# Patient Record
Sex: Female | Born: 1935 | Race: White | Hispanic: No | Smoking: Never smoker
Health system: Southern US, Community
[De-identification: ages and names within clinical notes are randomized; demographics above are authoritative.]

## PROBLEM LIST (undated history)

## (undated) DIAGNOSIS — R21 Rash and other nonspecific skin eruption: Secondary | ICD-10-CM

## (undated) DIAGNOSIS — E785 Hyperlipidemia, unspecified: Secondary | ICD-10-CM

## (undated) DIAGNOSIS — I2510 Atherosclerotic heart disease of native coronary artery without angina pectoris: Secondary | ICD-10-CM

## (undated) DIAGNOSIS — C50919 Malignant neoplasm of unspecified site of unspecified female breast: Secondary | ICD-10-CM

## (undated) DIAGNOSIS — E119 Type 2 diabetes mellitus without complications: Secondary | ICD-10-CM

## (undated) DIAGNOSIS — I10 Essential (primary) hypertension: Secondary | ICD-10-CM

## (undated) DIAGNOSIS — R51 Headache: Secondary | ICD-10-CM

## (undated) DIAGNOSIS — D649 Anemia, unspecified: Secondary | ICD-10-CM

## (undated) DIAGNOSIS — C801 Malignant (primary) neoplasm, unspecified: Secondary | ICD-10-CM

## (undated) DIAGNOSIS — R000 Tachycardia, unspecified: Secondary | ICD-10-CM

## (undated) DIAGNOSIS — N200 Calculus of kidney: Secondary | ICD-10-CM

## (undated) DIAGNOSIS — M1990 Unspecified osteoarthritis, unspecified site: Secondary | ICD-10-CM

## (undated) DIAGNOSIS — I499 Cardiac arrhythmia, unspecified: Secondary | ICD-10-CM

## (undated) DIAGNOSIS — R112 Nausea with vomiting, unspecified: Secondary | ICD-10-CM

## (undated) DIAGNOSIS — Z9889 Other specified postprocedural states: Secondary | ICD-10-CM

## (undated) DIAGNOSIS — R519 Headache, unspecified: Secondary | ICD-10-CM

## (undated) HISTORY — PX: EYE SURGERY: SHX253

## (undated) HISTORY — DX: Type 2 diabetes mellitus without complications: E11.9

## (undated) HISTORY — DX: Calculus of kidney: N20.0

## (undated) HISTORY — DX: Anemia, unspecified: D64.9

## (undated) HISTORY — DX: Hyperlipidemia, unspecified: E78.5

## (undated) HISTORY — DX: Atherosclerotic heart disease of native coronary artery without angina pectoris: I25.10

## (undated) HISTORY — PX: TEMPOROMANDIBULAR JOINT SURGERY: SHX35

## (undated) HISTORY — DX: Unspecified osteoarthritis, unspecified site: M19.90

## (undated) HISTORY — DX: Tachycardia, unspecified: R00.0

## (undated) HISTORY — DX: Essential (primary) hypertension: I10

## (undated) HISTORY — DX: Malignant neoplasm of unspecified site of unspecified female breast: C50.919

## (undated) HISTORY — PX: CATARACT EXTRACTION: SUR2

## (undated) HISTORY — PX: BREAST SURGERY: SHX581

---

## 2009-10-30 ENCOUNTER — Ambulatory Visit: Payer: Self-pay | Admitting: Internal Medicine

## 2012-02-08 ENCOUNTER — Emergency Department: Payer: Self-pay | Admitting: Emergency Medicine

## 2012-02-08 LAB — CBC
MCH: 31.1 pg (ref 26.0–34.0)
MCV: 95 fL (ref 80–100)
Platelet: 211 10*3/uL (ref 150–440)
RDW: 14.7 % — ABNORMAL HIGH (ref 11.5–14.5)

## 2012-02-08 LAB — COMPREHENSIVE METABOLIC PANEL
Albumin: 4.6 g/dL (ref 3.4–5.0)
Anion Gap: 8 (ref 7–16)
BUN: 27 mg/dL — ABNORMAL HIGH (ref 7–18)
Bilirubin,Total: 0.4 mg/dL (ref 0.2–1.0)
Co2: 22 mmol/L (ref 21–32)
Creatinine: 1.41 mg/dL — ABNORMAL HIGH (ref 0.60–1.30)
EGFR (Non-African Amer.): 36 — ABNORMAL LOW
Glucose: 152 mg/dL — ABNORMAL HIGH (ref 65–99)
Osmolality: 293 (ref 275–301)
Potassium: 3.6 mmol/L (ref 3.5–5.1)
SGOT(AST): 28 U/L (ref 15–37)
SGPT (ALT): 21 U/L (ref 12–78)
Sodium: 143 mmol/L (ref 136–145)
Total Protein: 8.3 g/dL — ABNORMAL HIGH (ref 6.4–8.2)

## 2012-02-08 LAB — URINALYSIS, COMPLETE
Glucose,UR: NEGATIVE mg/dL (ref 0–75)
Nitrite: NEGATIVE
Ph: 5 (ref 4.5–8.0)
Protein: NEGATIVE
Specific Gravity: 1.024 (ref 1.003–1.030)
Squamous Epithelial: <1
WBC UR: 8 /HPF (ref 0–5)

## 2012-02-19 ENCOUNTER — Ambulatory Visit: Payer: Self-pay | Admitting: Urology

## 2012-02-19 DIAGNOSIS — N200 Calculus of kidney: Secondary | ICD-10-CM | POA: Insufficient documentation

## 2012-02-19 DIAGNOSIS — N201 Calculus of ureter: Secondary | ICD-10-CM | POA: Insufficient documentation

## 2012-02-19 DIAGNOSIS — N1330 Unspecified hydronephrosis: Secondary | ICD-10-CM | POA: Insufficient documentation

## 2012-02-26 ENCOUNTER — Ambulatory Visit: Payer: Self-pay | Admitting: Urology

## 2012-03-11 ENCOUNTER — Ambulatory Visit: Payer: Self-pay | Admitting: Urology

## 2012-03-15 ENCOUNTER — Ambulatory Visit (INDEPENDENT_AMBULATORY_CARE_PROVIDER_SITE_OTHER): Payer: Medicare Other | Admitting: Cardiovascular Disease

## 2012-03-15 ENCOUNTER — Encounter: Payer: Self-pay | Admitting: Cardiovascular Disease

## 2012-03-15 VITALS — BP 182/90 | HR 125 | Ht 61.0 in | Wt 136.0 lb

## 2012-03-15 DIAGNOSIS — R0600 Dyspnea, unspecified: Secondary | ICD-10-CM

## 2012-03-15 DIAGNOSIS — R000 Tachycardia, unspecified: Secondary | ICD-10-CM

## 2012-03-15 DIAGNOSIS — I10 Essential (primary) hypertension: Secondary | ICD-10-CM

## 2012-03-15 DIAGNOSIS — R0609 Other forms of dyspnea: Secondary | ICD-10-CM

## 2012-03-15 DIAGNOSIS — R0789 Other chest pain: Secondary | ICD-10-CM

## 2012-03-15 DIAGNOSIS — E785 Hyperlipidemia, unspecified: Secondary | ICD-10-CM

## 2012-03-15 MED ORDER — METOPROLOL TARTRATE 25 MG PO TABS: 25.0000 mg | ORAL_TABLET | Freq: Two times a day (BID) | ORAL | Status: DC

## 2012-03-15 NOTE — Assessment & Plan Note (Signed)
The patient was noted to have coronary calcifications recently on CT scan. She has no convincing symptoms of angina. She complains of mild dyspnea. I will obtain an echocardiogram to evaluate LV systolic function. I will also check fasting lipid profile and start treatment with a statin if indicated. Once her blood pressure is controlled, a stress test will be considered if needed.

## 2012-03-15 NOTE — Assessment & Plan Note (Signed)
The patient is hypertensive and likely has been diagnosed hypertension given her recent frequent elevation in blood pressure. There is also evidence of left ventricular hypertrophy and her EKG. I recommend metoprolol 25 mg twice daily.

## 2012-03-15 NOTE — Assessment & Plan Note (Signed)
The patient has sinus tachycardia with a heart rate of 125 beats per minute of unclear etiology. It's possible that this is due to anxiety. However, the heart rate was still elevated even at the end of the visit when I examined her. During her recent emergency room evaluation for kidney stones, labs were overall unremarkable except for slightly elevated creatinine and BUN. I will check routine labs today including thyroid function.  Metoprolol be started as outlined below.

## 2012-03-15 NOTE — Patient Instructions (Addendum)
Labs today.   Your physician has requested that you have an echocardiogram. Echocardiography is a painless test that uses sound waves to create images of your heart. It provides your doctor with information about the size and shape of your heart and how well your heart's chambers and valves are working. This procedure takes approximately one hour. There are no restrictions for this procedure.  Start Metoprolol 25 mg twice daily.   Follow up after echo.

## 2012-03-15 NOTE — Progress Notes (Signed)
HPI  This is a 77 year old Caucasian female who was referred by Dr. Achilles Dunk for cardiac evaluation. She was noted to have coronary calcifications on CT scan recently. Patient is not aware of any previous cardiac history or chronic medical conditions. However, she has not seen a primary care physician in more than 7 years. She presented recently to the emergency room with back and abdominal pain. She was diagnosed with renal calculi on CT scan which incidentally showed calcifications in the coronary arteries. The patient underwent lithotripsy on October 31. She informs me that the stone passed to the bladder she might need to have surgical removal done if he doesn't pass continuously. She was noted on multiple occasions recently to be hypertensive. She reports very few episodes of chest discomfort in the past but none recently. There is mild dyspnea. She lives alone she is independent. She is able to do all activities of daily living without significant limitations.  No Known Allergies   Current Outpatient Prescriptions on File Prior to Visit  Medication Sig Dispense Refill  . metoprolol tartrate (LOPRESSOR) 25 MG tablet Take 1 tablet (25 mg total) by mouth 2 (two) times daily.  60 tablet  3     Past Medical History  Diagnosis Date  . Kidney stones      Past Surgical History  Procedure Date  . Temporomandibular joint surgery   . Cataract extraction     bilateral     History reviewed. No pertinent family history.   History   Social History  . Marital Status: Widowed    Spouse Name: N/A    Number of Children: N/A  . Years of Education: N/A   Occupational History  . Not on file.   Social History Main Topics  . Smoking status: Never Smoker   . Smokeless tobacco: Not on file  . Alcohol Use: No  . Drug Use: No  . Sexually Active:    Other Topics Concern  . Not on file   Social History Narrative  . No narrative on file     ROS Constitutional: Negative for fever,  chills, diaphoresis, activity change, appetite change and fatigue.  HENT: Negative for hearing loss, nosebleeds, congestion, sore throat, facial swelling, drooling, trouble swallowing, neck pain, voice change, sinus pressure and tinnitus.  Eyes: Negative for photophobia, pain, discharge and visual disturbance.  Respiratory: Negative for apnea, cough, chest tightness, shortness of breath and wheezing.  Cardiovascular: Negative for chest pain, palpitations and leg swelling.  Gastrointestinal: Negative for nausea, vomiting, abdominal pain, diarrhea, constipation, blood in stool and abdominal distention.  Genitourinary: Negative for dysuria, urgency, frequency, hematuria and decreased urine volume.  Musculoskeletal: Negative for myalgias, back pain, joint swelling, arthralgias and gait problem.  Skin: Negative for color change, pallor, rash and wound.  Neurological: Negative for dizziness, tremors, seizures, syncope, speech difficulty, weakness, light-headedness, numbness and headaches.  Psychiatric/Behavioral: Negative for suicidal ideas, hallucinations, behavioral problems and agitation. The patient is not nervous/anxious.     PHYSICAL EXAM   BP 182/90  Pulse 125  Ht 5\' 1"  (1.549 m)  Wt 136 lb (61.689 kg)  BMI 25.70 kg/m2 Constitutional: She is oriented to person, place, and time. She appears well-developed and well-nourished. No distress.  HENT: No nasal discharge.  Head: Normocephalic and atraumatic.  Eyes: Pupils are equal and round. Right eye exhibits no discharge. Left eye exhibits no discharge.  Neck: Normal range of motion. Neck supple. No JVD present. No thyromegaly present.  Cardiovascular: Normal rate, regular  rhythm, normal heart sounds. Exam reveals no gallop and no friction rub. No murmur heard.  Pulmonary/Chest: Effort normal and breath sounds normal. No stridor. No respiratory distress. She has no wheezes. She has no rales. She exhibits no tenderness.  Abdominal: Soft. Bowel  sounds are normal. She exhibits no distension. There is no tenderness. There is no rebound and no guarding.  Musculoskeletal: Normal range of motion. She exhibits no edema and no tenderness.  Neurological: She is alert and oriented to person, place, and time. Coordination normal.  Skin: Skin is warm and dry. No rash noted. She is not diaphoretic. No erythema. No pallor.  Psychiatric: She has a normal mood and affect. Her behavior is normal. Judgment and thought content normal.     EKG: Sinus  Tachycardia  Voltage criteria for LVH  (R(I)+S(III) exceeds 2.00 mV).   -Nonspecific ST depression  -Seen with left ventricular hypertrophy (strain).   ABNORMAL    ASSESSMENT AND PLAN

## 2012-03-16 LAB — BASIC METABOLIC PANEL
BUN: 8 mg/dL (ref 8–27)
CO2: 23 mmol/L (ref 19–28)
Chloride: 100 mmol/L (ref 97–108)
Glucose: 265 mg/dL — ABNORMAL HIGH (ref 65–99)

## 2012-03-16 LAB — HEPATIC FUNCTION PANEL
AST: 17 IU/L (ref 0–40)
Albumin: 4.2 g/dL (ref 3.5–4.8)
Alkaline Phosphatase: 89 IU/L (ref 39–117)
Total Bilirubin: 0.6 mg/dL (ref 0.0–1.2)

## 2012-03-16 LAB — TSH: TSH: 0.913 u[IU]/mL (ref 0.450–4.500)

## 2012-03-16 LAB — LIPID PANEL
Cholesterol, Total: 200 mg/dL — ABNORMAL HIGH (ref 100–199)
LDL Calculated: 114 mg/dL — ABNORMAL HIGH (ref 0–99)

## 2012-03-22 ENCOUNTER — Ambulatory Visit: Payer: Self-pay | Admitting: Urology

## 2012-03-30 ENCOUNTER — Other Ambulatory Visit: Payer: Self-pay

## 2012-03-30 ENCOUNTER — Other Ambulatory Visit (INDEPENDENT_AMBULATORY_CARE_PROVIDER_SITE_OTHER): Payer: Medicare Other

## 2012-03-30 DIAGNOSIS — R000 Tachycardia, unspecified: Secondary | ICD-10-CM

## 2012-03-30 DIAGNOSIS — R0600 Dyspnea, unspecified: Secondary | ICD-10-CM

## 2012-03-30 DIAGNOSIS — R0609 Other forms of dyspnea: Secondary | ICD-10-CM

## 2012-04-01 ENCOUNTER — Encounter: Payer: Self-pay | Admitting: Cardiovascular Disease

## 2012-04-01 ENCOUNTER — Ambulatory Visit: Payer: Self-pay | Admitting: Urology

## 2012-04-01 ENCOUNTER — Ambulatory Visit (INDEPENDENT_AMBULATORY_CARE_PROVIDER_SITE_OTHER): Payer: Medicare Other | Admitting: Cardiovascular Disease

## 2012-04-01 VITALS — BP 142/84 | HR 64 | Ht 61.0 in | Wt 133.2 lb

## 2012-04-01 DIAGNOSIS — I10 Essential (primary) hypertension: Secondary | ICD-10-CM

## 2012-04-01 DIAGNOSIS — R000 Tachycardia, unspecified: Secondary | ICD-10-CM

## 2012-04-01 DIAGNOSIS — Z01810 Encounter for preprocedural cardiovascular examination: Secondary | ICD-10-CM

## 2012-04-01 DIAGNOSIS — E119 Type 2 diabetes mellitus without complications: Secondary | ICD-10-CM

## 2012-04-01 DIAGNOSIS — E785 Hyperlipidemia, unspecified: Secondary | ICD-10-CM

## 2012-04-01 MED ORDER — ATORVASTATIN CALCIUM 20 MG PO TABS: 20.0000 mg | ORAL_TABLET | Freq: Every day | ORAL | Status: DC

## 2012-04-01 MED ORDER — ASPIRIN 81 MG PO TABS: 81.0000 mg | ORAL_TABLET | Freq: Every day | ORAL | Status: DC

## 2012-04-01 MED ORDER — METOPROLOL TARTRATE 25 MG PO TABS: 25.0000 mg | ORAL_TABLET | Freq: Two times a day (BID) | ORAL | Status: DC

## 2012-04-01 NOTE — Assessment & Plan Note (Signed)
The patient has good functional capacity and no clear symptoms suggestive of angina. Dyspnea improved significantly after the addition of metoprolol. Echocardiogram showed normal LV systolic function without significant valvular abnormalities. Thus, she is at an overall low risk for cardiovascular complications especially that her blood pressure is now controlled.

## 2012-04-01 NOTE — Assessment & Plan Note (Signed)
Resolved after starting metoprolol 25 mg twice daily.

## 2012-04-01 NOTE — Patient Instructions (Addendum)
Start Aspirin 81 mg once daily.  Continue Metoprolol.  Start Atorvastatin 20 mg at bedtime.  Fasting labs in 6 weeks.  Refer to Dr. Darrick Huntsman as a new patient.  Follow up in 3 months

## 2012-04-01 NOTE — Assessment & Plan Note (Signed)
Blood pressure improved significantly with metoprolol. The patient likely had and undiagnosed hypertension for a while given that she has evidence of LVH on both ECG and echo.

## 2012-04-01 NOTE — Progress Notes (Signed)
Primary care physician: None but she wants to establish with Dr. Darrick Huntsman.  Referring physician: Dr. Achilles Dunk.   HPI  This is a 77 year old Caucasian female who is here today for followup visit. She was noted to have coronary calcifications on CT scan recently. Patient is not aware of any previous cardiac history or chronic medical conditions. However, she has not seen a primary care physician in more than 7 years. She presented recently to the emergency room with back and abdominal pain. She was diagnosed with renal calculi on CT scan which incidentally showed calcifications in the coronary arteries. The patient underwent lithotripsy on October 31. She informs me that the stone passed to the bladder she might need to have surgical removal done if he doesn't pass continuously. She was noted on multiple occasions recently to be hypertensive. She reports very few episodes of chest discomfort in the past but none recently. There is mild dyspnea. She lives alone she is independent. She is able to do all activities of daily living without significant limitations. During last visit, she was noted to be tachycardic and hypertensive. Routine labs were overall unremarkable except for a blood sugar of 260. The patient is not aware of history of diabetes. I started her on metoprolol 25 mg twice daily. She is overall feeling better.  No Known Allergies   Current Outpatient Prescriptions on File Prior to Visit  Medication Sig Dispense Refill  . [DISCONTINUED] metoprolol tartrate (LOPRESSOR) 25 MG tablet Take 1 tablet (25 mg total) by mouth 2 (two) times daily.  60 tablet  3  . atorvastatin (LIPITOR) 20 MG tablet Take 1 tablet (20 mg total) by mouth daily.  90 tablet  6     Past Medical History  Diagnosis Date  . Kidney stones   . Hyperlipidemia   . Coronary artery disease     Coronary calcifications noted on CT scan  . Diabetes mellitus without complication      Past Surgical History  Procedure Date  .  Temporomandibular joint surgery   . Cataract extraction     bilateral     History reviewed. No pertinent family history.   History   Social History  . Marital Status: Widowed    Spouse Name: N/A    Number of Children: N/A  . Years of Education: N/A   Occupational History  . Not on file.   Social History Main Topics  . Smoking status: Never Smoker   . Smokeless tobacco: Not on file  . Alcohol Use: No  . Drug Use: No  . Sexually Active:    Other Topics Concern  . Not on file   Social History Narrative  . No narrative on file      PHYSICAL EXAM   BP 142/84  Pulse 64  Ht 5\' 1"  (1.549 m)  Wt 133 lb 4 oz (60.442 kg)  BMI 25.18 kg/m2 Constitutional: She is oriented to person, place, and time. She appears well-developed and well-nourished. No distress.  HENT: No nasal discharge.  Head: Normocephalic and atraumatic.  Eyes: Pupils are equal and round. Right eye exhibits no discharge. Left eye exhibits no discharge.  Neck: Normal range of motion. Neck supple. No JVD present. No thyromegaly present.  Cardiovascular: Normal rate, regular rhythm, normal heart sounds. Exam reveals no gallop and no friction rub. No murmur heard.  Pulmonary/Chest: Effort normal and breath sounds normal. No stridor. No respiratory distress. She has no wheezes. She has no rales. She exhibits no tenderness.  Abdominal:  Soft. Bowel sounds are normal. She exhibits no distension. There is no tenderness. There is no rebound and no guarding.  Musculoskeletal: Normal range of motion. She exhibits no edema and no tenderness.  Neurological: She is alert and oriented to person, place, and time. Coordination normal.  Skin: Skin is warm and dry. No rash noted. She is not diaphoretic. No erythema. No pallor.  Psychiatric: She has a normal mood and affect. Her behavior is normal. Judgment and thought content normal.     ABNORMAL    ASSESSMENT AND PLAN

## 2012-04-01 NOTE — Assessment & Plan Note (Signed)
Lab Results  Component Value Date   HDL 69 03/15/2012   LDLCALC 114* 03/15/2012   TRIG 84 03/15/2012   CHOLHDL 2.9 03/15/2012   Due to coronary calcifications noted on CT scan as well as the fact that she is likely diabetic, she should benefit from treatment with a statin. Thus, I will start her on atorvastatin 20 mg daily. I will request a followup lipid and liver profile in 6 weeks.

## 2012-04-01 NOTE — Assessment & Plan Note (Signed)
Patient likely has type 2 diabetes. I discussed with her the importance of low carbohydrate diet and exercise. I asked her to establish with a primary care physician regarding possible need for medications if blood sugar remains elevated.

## 2012-04-14 ENCOUNTER — Ambulatory Visit: Payer: Self-pay | Admitting: Urology

## 2012-04-16 ENCOUNTER — Encounter: Payer: Self-pay | Admitting: Internal Medicine

## 2012-04-16 ENCOUNTER — Ambulatory Visit (INDEPENDENT_AMBULATORY_CARE_PROVIDER_SITE_OTHER): Payer: Medicare Other | Admitting: Internal Medicine

## 2012-04-16 VITALS — BP 138/82 | HR 97 | Temp 97.6°F | Ht 60.5 in | Wt 132.0 lb

## 2012-04-16 DIAGNOSIS — E119 Type 2 diabetes mellitus without complications: Secondary | ICD-10-CM

## 2012-04-16 DIAGNOSIS — M810 Age-related osteoporosis without current pathological fracture: Secondary | ICD-10-CM

## 2012-04-16 DIAGNOSIS — Z1331 Encounter for screening for depression: Secondary | ICD-10-CM

## 2012-04-16 DIAGNOSIS — E785 Hyperlipidemia, unspecified: Secondary | ICD-10-CM

## 2012-04-16 MED ORDER — FREESTYLE SYSTEM KIT: 1.0000 | PACK | Status: DC | PRN

## 2012-04-16 NOTE — Progress Notes (Signed)
Patient ID: Dawn Wiley, female   DOB: 07-22-1934, 77 y.o.   MRN: [id number]  Patient Active Problem List  Diagnosis  . Tachycardia  . Dyspnea  . Hypertension  . Hyperlipidemia  . Diabetes mellitus without complication  . Preop cardiovascular exam    Subjective:  CC:   Chief Complaint  Patient presents with  . Establish Care    HPI:   Dawn Wiley is a 77 y.o. female who presents as a new patient to establish primary care with the chief complaint of Recent flurry of medical issues after long period of relatively good  health.  She presented with a  Kidney stone in October to [facility] and a  2 cm stone seen in ER CT scan .  Referred for lithotripsy which failed.  She has been scheduled for surgery 2025-01-03bc as the stone has passed into her bladder, by Dr. Achilles Dunk.  She has an unremarkable PMH but was sent to Dr. Kirke Corin bc of coronary calcifications seen   CT.  An ECHO was done and showed only mmild LV concentic hypertrophy,  No WMA. . She was treated for htn and cholesterol and aspirin was prescribed.   Used to take asa and bc powders in the past. Has been taking atorvastatin since  Dec 5 with no adverse effects including muscle pains .  Had a random blood sugar of 265 and has not had fasting sugar or fasting lipids yet.     Past Medical History  Diagnosis Date  . Kidney stones   . Hyperlipidemia   . Coronary artery disease     Coronary calcifications noted on CT scan  . Diabetes mellitus without complication     Past Surgical History  Procedure Date  . Temporomandibular joint surgery   . Cataract extraction     bilateral    History reviewed. No pertinent family history.  History   Social History  . Marital Status: Widowed    Spouse Name: N/A    Number of Children: N/A  . Years of Education: N/A   Occupational History  . Not on file.   Social History Main Topics  . Smoking status: Never Smoker   . Smokeless tobacco: Not on file  . Alcohol Use: No  . Drug Use:  No  . Sexually Active:    Other Topics Concern  . Not on file   Social History Narrative  . No narrative on file         @ALLHX @    Review of Systems:   The remainder of the review of systems was negative except those addressed in the HPI.       Objective:  BP 138/82  Pulse 97  Temp 97.6 F (36.4 C) (Oral)  Ht 5' 0.5" (1.537 m)  Wt 132 lb (59.875 kg)  BMI 25.36 kg/m2  SpO2 96%  General appearance: alert, cooperative and appears stated age Ears: normal TM's and external ear canals both ears Throat: lips, mucosa, and tongue normal; teeth and gums normal Neck: no adenopathy, no carotid bruit, supple, symmetrical, trachea midline and thyroid not enlarged, symmetric, no tenderness/mass/nodules Back: symmetric, no curvature. ROM normal. No CVA tenderness. Lungs: clear to auscultation bilaterally Heart: regular rate and rhythm, S1, S2 normal, no murmur, click, rub or gallop Abdomen: soft, non-tender; bowel sounds normal; no masses,  no organomegaly Pulses: 2+ and symmetric Skin: Skin color, texture, turgor normal. No rashes or lesions Lymph nodes: Cervical, supraclavicular, and axillary nodes normal.  Assessment and Plan:  Diabetes mellitus without complication Newly diagnosed with fasting sugar of 265 and  a1c of 9.3 with normal Cr and no proteinuria.  [facility] patient return to learn how to check her sugars with a glucometer and  start metformin and glipizide. Agree with postponement of surgery until blood sugars are < 200.   Hyperlipidemia On lipitor .ldl 88  Osteoporosis, post-menopausal Suggested by presence of compression fractures of L1 and L4 seen on recent noncontrasted CT .  Will send patient for DEXA after her surgery and start therapy post surgery with weekly alendronate.    Updated Medication List Outpatient Encounter Prescriptions as of 04/16/2012  Medication Sig Dispense Refill  . acetaminophen (TYLENOL) 500 MG tablet Take 500 mg by mouth every  6 (six) hours as needed.      [name] Kitchen aspirin 81 MG tablet Take 1 tablet (81 mg total) by mouth daily.  30 tablet    . atorvastatin (LIPITOR) 20 MG tablet Take 1 tablet (20 mg total) by mouth daily.  90 tablet  6  . metoprolol tartrate (LOPRESSOR) 25 MG tablet Take 1 tablet (25 mg total) by mouth 2 (two) times daily.  180 tablet  3  . glucose monitoring kit (FREESTYLE) monitoring kit 1 each by Does not apply route as needed for other. MAY CHOOSE ANY GLUCOMETER COVERED BY HER INSURANCE  1 each  0  . metFORMIN (GLUCOPHAGE) 500 MG tablet Take 1 tablet (500 mg total) by mouth 2 (two) times daily with a meal.  180 tablet  3

## 2012-04-16 NOTE — Patient Instructions (Addendum)
  We are checking your urine and blood today to see how advanced your diabetes is   Please go have an annual eye exam because of your new diagnosis of diabetes   I would like you to check your blood sugars once a day either fasting or 2 hours after a meal and records your numbers in a diary  You can come back to learn how to use it with one of my nurses unless the pharmacist will show you   Try [employer] pasta  ,  It is low carb and tastes great   Consider an alternative to oatmeal and cereal for breakfast .  You can try a sandwhich thin , toasted with peanut butter on it , which is very low carb.    Try the low carb whole wheat tortilla by [employer],  Instead of two slices of bread.   "Fat free" products have extra sugar pumped into them   Daily walking will help lower your blood sugars  Return in January for a follow up

## 2012-04-17 LAB — MICROALBUMIN / CREATININE URINE RATIO: Microalb Creat Ratio: 7.4 mg/g (ref 0.0–30.0)

## 2012-04-18 MED ORDER — METFORMIN HCL 500 MG PO TABS: 500.0000 mg | ORAL_TABLET | Freq: Two times a day (BID) | ORAL | Status: DC

## 2012-04-19 DIAGNOSIS — M810 Age-related osteoporosis without current pathological fracture: Secondary | ICD-10-CM | POA: Insufficient documentation

## 2012-04-19 NOTE — Assessment & Plan Note (Addendum)
Newly diagnosed with fasting sugar of 265 and  a1c of 9.3 with normal Cr and no proteinuria.  [facility] patient return to learn how to check her sugars with a glucometer and  start metformin and glipizide. Agree with postponement of surgery until blood sugars are < 200.

## 2012-04-19 NOTE — Assessment & Plan Note (Signed)
On lipitor .ldl 88

## 2012-04-19 NOTE — Assessment & Plan Note (Signed)
Suggested by presence of compression fractures of L1 and L4 seen on recent noncontrasted CT .  Will send patient for DEXA after her surgery and start therapy post surgery with weekly alendronate.

## 2012-04-20 ENCOUNTER — Telehealth: Payer: Self-pay | Admitting: *Deleted

## 2012-04-20 NOTE — Telephone Encounter (Signed)
Advised patient of lab results.  She's asking if ok to take immodium if the metformin does cause her to have loose stools.  She's afraid she'll lose to much fluid prior to her upcoming surgery if she has diarrhea.

## 2012-04-21 NOTE — Telephone Encounter (Signed)
Yes she can take immodium as needed for loose stools

## 2012-04-22 ENCOUNTER — Other Ambulatory Visit: Payer: Self-pay | Admitting: General Practice

## 2012-04-22 MED ORDER — SAFETY LANCET 28G/PRESSURE ACT MISC: 1.0000 | Status: DC | PRN

## 2012-04-22 MED ORDER — GLUCOSE BLOOD VI STRP: ORAL_STRIP | Status: DC

## 2012-04-22 NOTE — Telephone Encounter (Signed)
Pt.notified

## 2012-04-27 ENCOUNTER — Ambulatory Visit: Payer: Self-pay | Admitting: Urology

## 2012-05-13 ENCOUNTER — Other Ambulatory Visit: Payer: Medicare Other

## 2012-05-18 ENCOUNTER — Ambulatory Visit (INDEPENDENT_AMBULATORY_CARE_PROVIDER_SITE_OTHER): Payer: Medicare Other

## 2012-05-18 ENCOUNTER — Telehealth: Payer: Self-pay | Admitting: *Deleted

## 2012-05-18 DIAGNOSIS — I10 Essential (primary) hypertension: Secondary | ICD-10-CM

## 2012-05-18 DIAGNOSIS — R000 Tachycardia, unspecified: Secondary | ICD-10-CM

## 2012-05-18 NOTE — Telephone Encounter (Signed)
Continue Metoprolol 25 mg bid.  Add Losartan 25 mg once daily. Check BMP in 1 week after starting the medication.

## 2012-05-18 NOTE — Telephone Encounter (Signed)
Patient came in today for labs and requested to have her BP checked. She wanted to see how accurate her BP cuff was compared to our reading.  She mentioned that her BP has been fluctuating yesterday am she took it and it was 162/96 HR 72 and yesterday pm 180/91 HR 70. Today at 9:10 am when I checked her BP it was 185/84 HR 101. She had stated that she had not taken her medications this am. She will go home take her medications and give [place] a call back once she does so to see if her BP has came down. She is worried that her meds may not be working. Please advise.

## 2012-05-18 NOTE — Telephone Encounter (Signed)
Pt informed She is hesitant to start another medication and would like to monitor this for a few more days before adding med She says SBP decreased to 150 when she got home today I explained this is still higher than goal She will monitor BPs 3x daily over the next few days and keep log i will call her back Friday to reassess

## 2012-05-19 LAB — LIPID PANEL
Chol/HDL Ratio: 2.1 ratio units (ref 0.0–4.4)
VLDL Cholesterol Cal: 16 mg/dL (ref 5–40)

## 2012-05-19 LAB — HEPATIC FUNCTION PANEL
ALT: 10 IU/L (ref 0–32)
Albumin: 4.6 g/dL (ref 3.5–4.8)
Bilirubin, Direct: 0.16 mg/dL (ref 0.00–0.40)
Total Bilirubin: 0.6 mg/dL (ref 0.0–1.2)

## 2012-05-21 ENCOUNTER — Other Ambulatory Visit: Payer: Self-pay

## 2012-05-21 ENCOUNTER — Telehealth: Payer: Self-pay

## 2012-05-21 MED ORDER — AMLODIPINE BESYLATE 2.5 MG PO TABS: 2.5000 mg | ORAL_TABLET | Freq: Every day | ORAL | Status: DC

## 2012-05-21 NOTE — Telephone Encounter (Signed)
Losartan is a very good medication in patients who have diabetes. If she does not want to take that, then I recommend Amlodipine 2.5 mg once daily.

## 2012-05-21 NOTE — Telephone Encounter (Signed)
Pt asks that I call back in 2 hours

## 2012-05-21 NOTE — Telephone Encounter (Signed)
bp

## 2012-05-21 NOTE — Telephone Encounter (Signed)
Pt informed Understanding verb Would like to try amlodipine 2.5 mg qd instead of losartan Will send new RX to pharm She will monitor BP

## 2012-05-21 NOTE — Telephone Encounter (Signed)
I called pt back about BPs Says Bps remain elevated at 171/97,164/89, 160/87, 191/90, 165/90, 160/87 171/92 HR=67-82 BPM This am BP=187/87 I explained her BPs are not optimal and should consider starting losartan as suggested by Dr. Kirke Corin I then explained we would need to check BMP in 1 week She asks why. I explained it may effect K level, etc, which is why we do this lab work She is hesitant to take medication "that effects something else" and asks if there is another alternative that may not affect lab results I told her I would discuss with Dr. Kirke Corin and call her back Understanding verb

## 2012-07-02 ENCOUNTER — Ambulatory Visit: Payer: Medicare Other | Admitting: Cardiovascular Disease

## 2012-07-07 ENCOUNTER — Ambulatory Visit (INDEPENDENT_AMBULATORY_CARE_PROVIDER_SITE_OTHER): Payer: Medicare Other | Admitting: Internal Medicine

## 2012-07-07 ENCOUNTER — Encounter: Payer: Self-pay | Admitting: Internal Medicine

## 2012-07-07 VITALS — BP 134/86 | HR 88 | Temp 98.1°F | Resp 16 | Wt 120.2 lb

## 2012-07-07 DIAGNOSIS — E119 Type 2 diabetes mellitus without complications: Secondary | ICD-10-CM

## 2012-07-07 DIAGNOSIS — I10 Essential (primary) hypertension: Secondary | ICD-10-CM

## 2012-07-07 DIAGNOSIS — E785 Hyperlipidemia, unspecified: Secondary | ICD-10-CM

## 2012-07-07 NOTE — Patient Instructions (Addendum)
We will call you with the results of your blood work   You are doing well on your current medications,  We will not change anything until we taslk to you about your hgba1c

## 2012-07-07 NOTE — Progress Notes (Signed)
Patient ID: Dawn Wiley, female   DOB: March 30, 1935, 78 y.o.   MRN: [id number]    Patient Active Problem List  Diagnosis  . Tachycardia  . Dyspnea  . Hypertension  . Hyperlipidemia  . Diabetes mellitus without complication  . Preop cardiovascular exam  . Osteoporosis, post-menopausal    Subjective:  CC:   Chief Complaint  Patient presents with  . Follow-up    HPI:   Dawn Wiley a 78 y.o. female who presents 3 month follow up on diabetes , new diagnosis last visit.  She has been checking her sugars twice daily.  Her morning sugars 113  7 pm 163    Next day  131    Then    176      114  And 147    Yesterday   125   179  This morning 110    2 hrs later 151 post breakfast of protein breakfast  Does not want to change medications unless we have a discussion.   Reminded to have a  Diabetic eye exam every year.    No history of PAP smear ever,  Doesn't want a mammogram either until summer.  No colon Ca screening.    Doesn't want influenza or pneumonia vaccine.  Last tetanus was less than 10 yrs ago.,  No contact with kids.     Past Medical History  Diagnosis Date  . Kidney stones   . Hyperlipidemia   . Coronary artery disease     Coronary calcifications noted on CT scan  . Diabetes mellitus without complication     Past Surgical History  Procedure Laterality Date  . Temporomandibular joint surgery    . Cataract extraction      bilateral       The following portions of the patient's history were reviewed and updated as appropriate: Allergies, current medications, and problem list.    Review of Systems:   Patient denies headache, fevers, malaise, unintentional weight loss, skin rash, eye pain, sinus congestion and sinus pain, sore throat, dysphagia,  hemoptysis , cough, dyspnea, wheezing, chest pain, palpitations, orthopnea, edema, abdominal pain, nausea, melena, diarrhea, constipation, flank pain, dysuria, hematuria, urinary  Frequency, nocturia, numbness,  tingling, seizures,  Focal weakness, Loss of consciousness,  Tremor, insomnia, depression, anxiety, and suicidal ideation.     History   Social History  . Marital Status: Widowed    Spouse Name: N/A    Number of Children: N/A  . Years of Education: N/A   Occupational History  . Not on file.   Social History Main Topics  . Smoking status: Never Smoker   . Smokeless tobacco: Not on file  . Alcohol Use: No  . Drug Use: No  . Sexually Active:    Other Topics Concern  . Not on file   Social History Narrative  . No narrative on file    Objective:  BP 134/86  Pulse 88  Temp(Src) 98.1 F (36.7 C) (Oral)  Resp 16  Wt 120 lb 4 oz (54.545 kg)  BMI 23.09 kg/m2  SpO2 97%  General appearance: alert, cooperative and appears stated age Ears: normal TM's and external ear canals both ears Throat: lips, mucosa, and tongue normal; teeth and gums normal Neck: no adenopathy, no carotid bruit, supple, symmetrical, trachea midline and thyroid not enlarged, symmetric, no tenderness/mass/nodules Back: symmetric, no curvature. ROM normal. No CVA tenderness. Lungs: clear to auscultation bilaterally Heart: regular rate and rhythm, S1, S2 normal, no murmur, click, rub or gallop  Abdomen: soft, non-tender; bowel sounds normal; no masses,  no organomegaly Pulses: 2+ and symmetric Skin: Skin color, texture, turgor normal. No rashes or lesions Lymph nodes: Cervical, supraclavicular, and axillary nodes normal.  Assessment and Plan:  Diabetes mellitus without complication Well-controlled on current medications.  hemoglobin A1c has been consistently less than 7.0 . She is reminded to have eye exams and her foot exam is normal. l we'll repeat his urine microalbumin to creatinine ratio at next visit. She is on the appropriate medications.  Hyperlipidemia Well controlled on current regimen. Liver enzymes normal, no changes today.  Hypertension Well controlled on current regimen. Renal function  stable, no changes today.  A total of 30 minutes of face to face time was spent with patient more than half of which was spent in counselling and coordination of care   Updated Medication List Outpatient Encounter Prescriptions as of 07/07/2012  Medication Sig Dispense Refill  . acetaminophen (TYLENOL) 500 MG tablet Take 500 mg by mouth every 6 (six) hours as needed.      [name] Kitchen amLODipine (NORVASC) 2.5 MG tablet Take 1 tablet (2.5 mg total) by mouth daily.  180 tablet  3  . aspirin 81 MG tablet Take 1 tablet (81 mg total) by mouth daily.  30 tablet    . atorvastatin (LIPITOR) 20 MG tablet Take 1 tablet (20 mg total) by mouth daily.  90 tablet  6  . glucose blood (TRUETEST TEST) test strip Use one strip each time glucose levels are to be tested.  100 each  12  . glucose monitoring kit (FREESTYLE) monitoring kit 1 each by Does not apply route as needed for other. MAY CHOOSE ANY GLUCOMETER COVERED BY HER INSURANCE  1 each  0  . Lancets (SAFETY LANCET 28G/PRESSURE ACT) MISC 1 each by Other route as needed (to test glucose levels).  50 each  12  . metFORMIN (GLUCOPHAGE) 500 MG tablet Take 1 tablet (500 mg total) by mouth 2 (two) times daily with a meal.  180 tablet  3  . metoprolol tartrate (LOPRESSOR) 25 MG tablet Take 1 tablet (25 mg total) by mouth 2 (two) times daily.  180 tablet  3   No facility-administered encounter medications on file as of 07/07/2012.

## 2012-07-08 LAB — COMPREHENSIVE METABOLIC PANEL
ALT: 15 U/L (ref 0–35)
AST: 21 U/L (ref 0–37)
Albumin: 4.4 g/dL (ref 3.5–5.2)
Alkaline Phosphatase: 82 U/L (ref 39–117)
Glucose, Bld: 143 mg/dL — ABNORMAL HIGH (ref 70–99)
Potassium: 4.5 mEq/L (ref 3.5–5.1)
Sodium: 138 mEq/L (ref 135–145)
Total Bilirubin: 0.9 mg/dL (ref 0.3–1.2)
Total Protein: 7.4 g/dL (ref 6.0–8.3)

## 2012-07-08 LAB — HEMOGLOBIN A1C: Hgb A1c MFr Bld: 5.7 % (ref 4.6–6.5)

## 2012-07-09 ENCOUNTER — Encounter: Payer: Self-pay | Admitting: Internal Medicine

## 2012-07-09 NOTE — Assessment & Plan Note (Signed)
Well controlled on current regimen. Liver enzymes normal, no changes today.

## 2012-07-09 NOTE — Assessment & Plan Note (Signed)
Well controlled on current regimen. Renal function stable, no changes today. 

## 2012-07-09 NOTE — Assessment & Plan Note (Addendum)
Well-controlled on current medications.  hemoglobin A1c has been consistently less than 7.0 . She is reminded to have eye exams and her foot exam is normal. l we'll repeat his urine microalbumin to creatinine ratio at next visit. She is on the appropriate medications.

## 2012-07-22 ENCOUNTER — Encounter: Payer: Self-pay | Admitting: Cardiovascular Disease

## 2012-07-22 ENCOUNTER — Ambulatory Visit (INDEPENDENT_AMBULATORY_CARE_PROVIDER_SITE_OTHER): Payer: Medicare Other | Admitting: Cardiovascular Disease

## 2012-07-22 VITALS — BP 136/70 | HR 80 | Ht 60.5 in | Wt 122.5 lb

## 2012-07-22 DIAGNOSIS — I10 Essential (primary) hypertension: Secondary | ICD-10-CM

## 2012-07-22 DIAGNOSIS — R000 Tachycardia, unspecified: Secondary | ICD-10-CM

## 2012-07-22 DIAGNOSIS — E785 Hyperlipidemia, unspecified: Secondary | ICD-10-CM

## 2012-07-22 NOTE — Progress Notes (Signed)
Primary care physician:  Dr. Darrick Huntsman.   HPI  This is a 78 year old Caucasian female who is here today for followup visit. She was noted to have coronary calcifications on CT scan in 2013.  She was noted to be mildly tachycardic on initial evaluation. Echocardiogram showed normal LV systolic function without significant valvular abnormalities. She was also noted to have hypertension on multiple occasions. She was started on metoprolol and amlodipine. Her labs showed evidence of diabetes with glucose above 200. She was started on atorvastatin for hyperlipidemia. She establish with Dr. Darrick Huntsman. Her diabetes control is now excellent. The patient is doing well and denies any chest pain, palpitations or dyspnea. No Known Allergies   Current Outpatient Prescriptions on File Prior to Visit  Medication Sig Dispense Refill  . acetaminophen (TYLENOL) 500 MG tablet Take 500 mg by mouth every 6 (six) hours as needed.      [name] Kitchen amLODipine (NORVASC) 2.5 MG tablet Take 1 tablet (2.5 mg total) by mouth daily.  180 tablet  3  . aspirin 81 MG tablet Take 1 tablet (81 mg total) by mouth daily.  30 tablet    . atorvastatin (LIPITOR) 20 MG tablet Take 1 tablet (20 mg total) by mouth daily.  90 tablet  6  . glucose blood (TRUETEST TEST) test strip Use one strip each time glucose levels are to be tested.  100 each  12  . glucose monitoring kit (FREESTYLE) monitoring kit 1 each by Does not apply route as needed for other. MAY CHOOSE ANY GLUCOMETER COVERED BY HER INSURANCE  1 each  0  . Lancets (SAFETY LANCET 28G/PRESSURE ACT) MISC 1 each by Other route as needed (to test glucose levels).  50 each  12  . metFORMIN (GLUCOPHAGE) 500 MG tablet Take 1 tablet (500 mg total) by mouth 2 (two) times daily with a meal.  180 tablet  3  . metoprolol tartrate (LOPRESSOR) 25 MG tablet Take 1 tablet (25 mg total) by mouth 2 (two) times daily.  180 tablet  3   No current facility-administered medications on file prior to visit.      Past Medical History  Diagnosis Date  . Kidney stones   . Hyperlipidemia   . Coronary artery disease     Coronary calcifications noted on CT scan  . Diabetes mellitus without complication      Past Surgical History  Procedure Laterality Date  . Temporomandibular joint surgery    . Cataract extraction      bilateral     History reviewed. No pertinent family history.   History   Social History  . Marital Status: Widowed    Spouse Name: N/A    Number of Children: N/A  . Years of Education: N/A   Occupational History  . Not on file.   Social History Main Topics  . Smoking status: Never Smoker   . Smokeless tobacco: Not on file  . Alcohol Use: No  . Drug Use: No  . Sexually Active:    Other Topics Concern  . Not on file   Social History Narrative  . No narrative on file      PHYSICAL EXAM   BP 136/70  Pulse 80  Ht 5' 0.5" (1.537 m)  Wt 122 lb 8 oz (55.566 kg)  BMI 23.52 kg/m2 Constitutional: She is oriented to person, place, and time. She appears well-developed and well-nourished. No distress.  HENT: No nasal discharge.  Head: Normocephalic and atraumatic.  Eyes: Pupils are equal  and round. Right eye exhibits no discharge. Left eye exhibits no discharge.  Neck: Normal range of motion. Neck supple. No JVD present. No thyromegaly present.  Cardiovascular: Normal rate, regular rhythm, normal heart sounds. Exam reveals no gallop and no friction rub. No murmur heard.  Pulmonary/Chest: Effort normal and breath sounds normal. No stridor. No respiratory distress. She has no wheezes. She has no rales. She exhibits no tenderness.  Abdominal: Soft. Bowel sounds are normal. She exhibits no distension. There is no tenderness. There is no rebound and no guarding.  Musculoskeletal: Normal range of motion. She exhibits no edema and no tenderness.  Neurological: She is alert and oriented to person, place, and time. Coordination normal.  Skin: Skin is warm and dry.  No rash noted. She is not diaphoretic. No erythema. No pallor.  Psychiatric: She has a normal mood and affect. Her behavior is normal. Judgment and thought content normal.     [id number]  Rhythm  WITHIN NORMAL LIMITS   ASSESSMENT AND PLAN

## 2012-07-22 NOTE — Assessment & Plan Note (Signed)
Sinus tachycardia resolved with treatment with small dose metoprolol. LV systolic function was normal an echo. The patient has no symptoms suggestive of angina. She can followup with me as needed.

## 2012-07-22 NOTE — Patient Instructions (Addendum)
Continue same medications.   Follow up as needed.  

## 2012-07-22 NOTE — Assessment & Plan Note (Signed)
She is tolerating treatment with atorvastatin 20 mg daily. Most recent lipid profile was optimal. This was started due to diabetes and coronary calcifications noted on CT scan. Lab Results  Component Value Date   HDL 71 05/18/2012   LDLCALC 64 05/18/2012   TRIG 78 05/18/2012   CHOLHDL 2.1 05/18/2012

## 2012-07-22 NOTE — Assessment & Plan Note (Signed)
Blood pressure is well controlled on current medications. 

## 2012-09-08 ENCOUNTER — Ambulatory Visit: Payer: Self-pay | Admitting: Urology

## 2012-09-08 DIAGNOSIS — R351 Nocturia: Secondary | ICD-10-CM | POA: Insufficient documentation

## 2012-10-13 ENCOUNTER — Ambulatory Visit (INDEPENDENT_AMBULATORY_CARE_PROVIDER_SITE_OTHER): Payer: Medicare Other | Admitting: Internal Medicine

## 2012-10-13 ENCOUNTER — Encounter: Payer: Self-pay | Admitting: Internal Medicine

## 2012-10-13 VITALS — BP 130/76 | HR 86 | Temp 98.6°F | Resp 16 | Wt 117.8 lb

## 2012-10-13 DIAGNOSIS — E785 Hyperlipidemia, unspecified: Secondary | ICD-10-CM

## 2012-10-13 DIAGNOSIS — N200 Calculus of kidney: Secondary | ICD-10-CM | POA: Insufficient documentation

## 2012-10-13 DIAGNOSIS — E119 Type 2 diabetes mellitus without complications: Secondary | ICD-10-CM

## 2012-10-13 LAB — COMPREHENSIVE METABOLIC PANEL
Albumin: 4.2 g/dL (ref 3.5–5.2)
BUN: 16 mg/dL (ref 6–23)
CO2: 26 mEq/L (ref 19–32)
Calcium: 9.1 mg/dL (ref 8.4–10.5)
Chloride: 99 mEq/L (ref 96–112)
Creatinine, Ser: 0.6 mg/dL (ref 0.4–1.2)
GFR: 114.11 mL/min (ref >60.00)
Glucose, Bld: 172 mg/dL — ABNORMAL HIGH (ref 70–99)
Potassium: 4.1 mEq/L (ref 3.5–5.1)

## 2012-10-13 NOTE — Patient Instructions (Addendum)
You afre doing very well!  Your diet is excellent.    [place]'s spaghetti can be found on the past aisle at [employer], [place] and [place].   You can stop the atorvastatin permanently and try the "Garlicque" if you want.   We will check your fasting cholesterol in 3 months , not today .  Today we are checking your A1c and liver/kidney function.  You do not need to see Dr. Kirke Corin unless you have chest pain .  If your blood pressure stays up above 150 for several days,  Increase the amlodipine to 5 mg daily from 2.5 mg and let me know.   I will manage your blood pressure and cholesterol.

## 2012-10-13 NOTE — Assessment & Plan Note (Addendum)
Well-controlled on current medications.  hemoglobin A1c has been consistently less than 7.0 . She is up-to-date on eye exams and her foot exam is normal. we'll repeat his urine microalbumin to creatinine ratio at next visit. SHe is on the appropriate medications but did not tolerate a statin due to palpitations

## 2012-10-13 NOTE — Progress Notes (Signed)
Patient ID: Dawn Wiley, female   DOB: [birth year], 78 y.o.   MRN: [id number]  Patient Active Problem List   Diagnosis Date Noted  . Nephrolithiasis   . 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 Preop cardiovascular exam 04/01/2012  . Hyperlipidemia   . Diabetes mellitus without complication   . Tachycardia 03/15/2012  . Dyspnea 03/15/2012  . Hypertension 03/15/2012    Subjective:  CC:   Chief Complaint  Patient presents with  . Follow-up    3 month    HPI:   Dawn Wiley a 78 y.o. female who presents for 3 month follow up on diabetes mellitus, hyperlipidemia and hypertension .  She has been tolerating metformin, without side effects.  Last a1c is 5.7 in March .  Avoiding potatoes most days but still has them at least once a week.  Checks blood sugars once daily,  and notices that she develops blurred vision with elevations in BS (after spaghetti and dessert at church it went to 195. )   Started on lipitor by Lorne Skeens it for one month only., because it caused frequent palpitations despite using metoprolol.  She tapered herself off of the lipitor and the symptoms subsided want sto recheck it again today as an untreated panel.     Past Medical History  Diagnosis Date  . Kidney stones   . Hyperlipidemia   . Coronary artery disease     Coronary calcifications noted on CT scan  . Diabetes mellitus without complication   . Nephrolithiasis     Past Surgical History  Procedure Laterality Date  . Temporomandibular joint surgery    . Cataract extraction      bilateral       The following portions of the patient's history were reviewed and updated as appropriate: Allergies, current medications, and problem list.    Review of Systems:   Patient denies headache, fevers, malaise, unintentional weight loss, skin rash, eye pain, sinus congestion and sinus pain, sore throat, dysphagia,  hemoptysis , cough, dyspnea, wheezing, chest pain, palpitations, orthopnea,  edema, abdominal pain, nausea, melena, diarrhea, constipation, flank pain, dysuria, hematuria, urinary  Frequency, nocturia, numbness, tingling, seizures,  Focal weakness, Loss of consciousness,  Tremor, insomnia, depression, anxiety, and suicidal ideation.     History   Social History  . Marital Status: Widowed    Spouse Name: N/A    Number of Children: N/A  . Years of Education: N/A   Occupational History  . Not on file.   Social History Main Topics  . Smoking status: Never Smoker   . Smokeless tobacco: Not on file  . Alcohol Use: No  . Drug Use: No  . Sexually Active:    Other Topics Concern  . Not on file   Social History Narrative  . No narrative on file    Objective:  BP 130/76  Pulse 86  Temp(Src) 98.6 F (37 C) (Oral)  Resp 16  Wt 117 lb 12 oz (53.411 kg)  BMI 22.61 kg/m2  SpO2 99%  General appearance: alert, cooperative and appears stated age Ears: normal TM's and external ear canals both ears Throat: lips, mucosa, and tongue normal; teeth and gums normal Neck: no adenopathy, no carotid bruit, supple, symmetrical, trachea midline and thyroid not enlarged, symmetric, no tenderness/mass/nodules Back: symmetric, no curvature. ROM normal. No CVA tenderness. Lungs: clear to auscultation bilaterally Heart: regular rate and rhythm, S1, S2 normal, no murmur, click, rub or gallop Abdomen: soft, non-tender; bowel sounds normal; no masses,  no  organomegaly Pulses: 2+ and symmetric Skin: Skin color, texture, turgor normal. No rashes or lesions Lymph nodes: Cervical, supraclavicular, and axillary nodes normal.  Assessment and Plan:  Diabetes mellitus without complication Well-controlled on current medications.  hemoglobin A1c has been consistently less than 7.0 . She is up-to-date on eye exams and her foot exam is normal. we'll repeat his urine microalbumin to creatinine ratio at next visit. SHe is on the appropriate medications but did not tolerate a statin due to  palpitations  Hyperlipidemia Her treated LDL was 64  In January but she has not tolerated lipitor so she has stopped it.  Will repeat in 3 months.   A total of 25 minutes was spent with patient more than half of which was spent in counseling, reviewing records from other prviders and coordination of care.  Updated Medication List Outpatient Encounter Prescriptions as of 10/13/2012  Medication Sig Dispense Refill  . acetaminophen (TYLENOL) 500 MG tablet Take 500 mg by mouth every 6 (six) hours as needed.      [name] Kitchen amLODipine (NORVASC) 2.5 MG tablet Take 1 tablet (2.5 mg total) by mouth daily.  180 tablet  3  . aspirin 81 MG tablet Take 1 tablet (81 mg total) by mouth daily.  30 tablet    . atorvastatin (LIPITOR) 20 MG tablet Take 1 tablet (20 mg total) by mouth daily.  90 tablet  6  . glucose blood (TRUETEST TEST) test strip Use one strip each time glucose levels are to be tested.  100 each  12  . glucose monitoring kit (FREESTYLE) monitoring kit 1 each by Does not apply route as needed for other. MAY CHOOSE ANY GLUCOMETER COVERED BY HER INSURANCE  1 each  0  . Lancets (SAFETY LANCET 28G/PRESSURE ACT) MISC 1 each by Other route as needed (to test glucose levels).  50 each  12  . metFORMIN (GLUCOPHAGE) 500 MG tablet Take 1 tablet (500 mg total) by mouth 2 (two) times daily with a meal.  180 tablet  3  . metoprolol tartrate (LOPRESSOR) 25 MG tablet Take 1 tablet (25 mg total) by mouth 2 (two) times daily.  180 tablet  3   No facility-administered encounter medications on file as of 10/13/2012.     Orders Placed This Encounter  Procedures  . Hemoglobin A1c  . Comprehensive metabolic panel    No Follow-up on file.

## 2012-10-14 ENCOUNTER — Encounter: Payer: Self-pay | Admitting: *Deleted

## 2012-10-15 ENCOUNTER — Encounter: Payer: Self-pay | Admitting: Internal Medicine

## 2012-10-15 NOTE — Assessment & Plan Note (Addendum)
Her treated LDL was 64  In January but she has not tolerated lipitor so she has stopped it.  Will repeat in 3 months.

## 2013-01-13 ENCOUNTER — Encounter: Payer: Self-pay | Admitting: Internal Medicine

## 2013-01-13 ENCOUNTER — Ambulatory Visit (INDEPENDENT_AMBULATORY_CARE_PROVIDER_SITE_OTHER): Payer: Medicare Other | Admitting: Internal Medicine

## 2013-01-13 VITALS — BP 138/60 | HR 99 | Temp 98.2°F | Resp 14 | Ht 60.5 in | Wt 116.2 lb

## 2013-01-13 DIAGNOSIS — R0989 Other specified symptoms and signs involving the circulatory and respiratory systems: Secondary | ICD-10-CM

## 2013-01-13 DIAGNOSIS — E785 Hyperlipidemia, unspecified: Secondary | ICD-10-CM

## 2013-01-13 DIAGNOSIS — E119 Type 2 diabetes mellitus without complications: Secondary | ICD-10-CM

## 2013-01-13 DIAGNOSIS — I10 Essential (primary) hypertension: Secondary | ICD-10-CM

## 2013-01-13 LAB — MICROALBUMIN / CREATININE URINE RATIO
Creatinine,U: 108.8 mg/dL
Microalb Creat Ratio: 0.6 mg/g (ref 0.0–30.0)

## 2013-01-13 LAB — LIPID PANEL
Cholesterol: 194 mg/dL (ref 0–200)
LDL Cholesterol: 103 mg/dL — ABNORMAL HIGH (ref 0–99)
Triglycerides: 94 mg/dL (ref 0.0–149.0)

## 2013-01-13 LAB — COMPREHENSIVE METABOLIC PANEL
ALT: 10 U/L (ref 0–35)
AST: 14 U/L (ref 0–37)
Albumin: 4.2 g/dL (ref 3.5–5.2)
BUN: 13 mg/dL (ref 6–23)
Calcium: 9.4 mg/dL (ref 8.4–10.5)
Chloride: 106 mEq/L (ref 96–112)
Potassium: 4.6 mEq/L (ref 3.5–5.1)

## 2013-01-13 NOTE — Assessment & Plan Note (Signed)
Well controlled on current regimen. Renal function stable, no proteinuria, no changes today.

## 2013-01-13 NOTE — Assessment & Plan Note (Signed)
a1c is 7.2  No proteinuria.  Continue aspirin.  Strongly urged to retry statin

## 2013-01-13 NOTE — Assessment & Plan Note (Signed)
Bilateral on exam today.  She has been taking asa but not statin due to palpitations.  She is ambivalent about having the bruits evaluated but is at increased risk for CVA given her  Hyperlipidemia and DM.

## 2013-01-13 NOTE — Assessment & Plan Note (Addendum)
Untreated due to statin intolerance .[name] Kitchen LDL is 103 and HDL is 72.  Without medications

## 2013-01-13 NOTE — Progress Notes (Signed)
Patient ID: Dawn Wiley, female   DOB: 12-25-1934, 78 y.o.   MRN: [id number]   Patient Active Problem List   Diagnosis Date Noted  . Carotid artery bruit 01/13/2013  . Nephrolithiasis   . 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 Preop cardiovascular exam 04/01/2012  . Hyperlipidemia   . Diabetes mellitus without complication   . Tachycardia 03/15/2012  . Dyspnea 03/15/2012  . Hypertension 03/15/2012    Subjective:  CC:   Chief Complaint  Patient presents with  . Follow-up    HPI:   Sriya Kroeze a 78 y.o. female who presents 3 month follow up on DM, hyperlipidemia and hypertension.  She has been follow a low glycemic index diet but eating more compared to the prior visit.  Taking her metformin and other medications without diarrhea.  Does not check her blood sugars.  Has been using Stevia as her sweetener and wants my opinion on its safety. She does not want the flu shot and has not been taking a Statin since last visit due to increased palpitations which stopped when she discontinued it. Pharmacist was asking her about ACE In hibitors   Past Medical History  Diagnosis Date  . Kidney stones   . Hyperlipidemia   . Coronary artery disease     Coronary calcifications noted on CT scan  . Diabetes mellitus without complication   . Nephrolithiasis     Past Surgical History  Procedure Laterality Date  . Temporomandibular joint surgery    . Cataract extraction      bilateral       The following portions of the patient's history were reviewed and updated as appropriate: Allergies, current medications, and problem list.    Review of Systems:   12 Pt  review of systems was negative except those addressed in the HPI,     History   Social History  . Marital Status: Widowed    Spouse Name: N/A    Number of Children: N/A  . Years of Education: N/A   Occupational History  . Not on file.   Social History Main Topics  . Smoking status: Never Smoker   .  Smokeless tobacco: Not on file  . Alcohol Use: No  . Drug Use: No  . Sexual Activity:    Other Topics Concern  . Not on file   Social History Narrative  . No narrative on file    Objective:  Filed Vitals:   01/13/13 1057  BP: 138/60  Pulse: 99  Temp: 98.2 F (36.8 C)  Resp: 14     General appearance: alert, cooperative and appears stated age Ears: normal TM's and external ear canals both ears Throat: lips, mucosa, and tongue normal; teeth and gums normal Neck: no adenopathy, bilateral  carotid bruit, supple, symmetrical, trachea midline and thyroid not enlarged, symmetric, no tenderness/mass/nodules Back: symmetric, no curvature. ROM normal. No CVA tenderness. Lungs: clear to auscultation bilaterally Heart: regular rate and rhythm, S1, S2 normal, no murmur, click, rub or gallop Abdomen: soft, non-tender; bowel sounds normal; no masses,  no organomegaly Pulses: 2+ and symmetric Skin: Skin color, texture, turgor normal. No rashes or lesions Lymph nodes: Cervical, supraclavicular, and axillary nodes normal.  Assessment and Plan:  Hyperlipidemia Untreated due to statin intolerance .[name] Kitchen LDL is 103 and HDL is 72.  Without medications   Carotid artery bruit Bilateral on exam today.  She has been taking asa but not statin due to palpitations.  She is ambivalent about having the bruits evaluated but is  at increased risk for CVA given her  Hyperlipidemia and DM.   Diabetes mellitus without complication a1c is 7.2  No proteinuria.  Continue aspirin.  Strongly urged to retry statin   Hypertension Well controlled on current regimen. Renal function stable, no proteinuria, no changes today.  A total of 40 minutes was spent with patient more than half of which was spent in counseling, reviewing records from other prviders and coordination of care.  Updated Medication List Outpatient Encounter Prescriptions as of 01/13/2013  Medication Sig Dispense Refill  . acetaminophen (TYLENOL)  500 MG tablet Take 500 mg by mouth every 6 (six) hours as needed.      [name] Kitchen amLODipine (NORVASC) 2.5 MG tablet Take 1 tablet (2.5 mg total) by mouth daily.  180 tablet  3  . aspirin 81 MG tablet Take 1 tablet (81 mg total) by mouth daily.  30 tablet    . glucose blood (TRUETEST TEST) test strip Use one strip each time glucose levels are to be tested.  100 each  12  . glucose monitoring kit (FREESTYLE) monitoring kit 1 each by Does not apply route as needed for other. MAY CHOOSE ANY GLUCOMETER COVERED BY HER INSURANCE  1 each  0  . Lancets (SAFETY LANCET 28G/PRESSURE ACT) MISC 1 each by Other route as needed (to test glucose levels).  50 each  12  . metFORMIN (GLUCOPHAGE) 500 MG tablet Take 1 tablet (500 mg total) by mouth 2 (two) times daily with a meal.  180 tablet  3  . metoprolol tartrate (LOPRESSOR) 25 MG tablet Take 1 tablet (25 mg total) by mouth 2 (two) times daily.  180 tablet  3  . [DISCONTINUED] atorvastatin (LIPITOR) 20 MG tablet Take 1 tablet (20 mg total) by mouth daily.  90 tablet  6   No facility-administered encounter medications on file as of 01/13/2013.     Orders Placed This Encounter  Procedures  . Lipid panel  . Hemoglobin A1c  . Microalbumin / creatinine urine ratio  . Comprehensive metabolic panel  . HM DIABETES FOOT EXAM    No Follow-up on file.

## 2013-01-13 NOTE — Patient Instructions (Addendum)
You have bilateral carotid bruits (sounds that suggest you may have a partial blockage on both carotid arteries)  Continue your daily aspirin   I recommend that we evaluate your carotid arteries with an ultrasound  To see how significant the blockage is  If your urinalysis has protein in it,  We will change your blood pressure medication from amlodipine to lisinopril.

## 2013-01-18 ENCOUNTER — Encounter: Payer: Self-pay | Admitting: *Deleted

## 2013-04-05 ENCOUNTER — Other Ambulatory Visit: Payer: Self-pay | Admitting: *Deleted

## 2013-04-05 ENCOUNTER — Other Ambulatory Visit: Payer: Self-pay | Admitting: Cardiovascular Disease

## 2013-04-05 DIAGNOSIS — I10 Essential (primary) hypertension: Secondary | ICD-10-CM

## 2013-04-05 DIAGNOSIS — R000 Tachycardia, unspecified: Secondary | ICD-10-CM

## 2013-04-05 MED ORDER — METOPROLOL TARTRATE 25 MG PO TABS: 25.0000 mg | ORAL_TABLET | Freq: Two times a day (BID) | ORAL | Status: DC

## 2013-04-05 NOTE — Telephone Encounter (Signed)
Requested Prescriptions   Signed Prescriptions Disp Refills   metoprolol tartrate (LOPRESSOR) 25 MG tablet 180 tablet 3    Sig: Take 1 tablet (25 mg total) by mouth 2 (two) times daily.    Authorizing Provider: GOLLAN, TIMOTHY J    Ordering User: LOPEZ, MARINA C    

## 2013-04-14 ENCOUNTER — Encounter (INDEPENDENT_AMBULATORY_CARE_PROVIDER_SITE_OTHER): Payer: Self-pay

## 2013-04-14 ENCOUNTER — Ambulatory Visit (INDEPENDENT_AMBULATORY_CARE_PROVIDER_SITE_OTHER): Payer: Medicare Other | Admitting: Internal Medicine

## 2013-04-14 VITALS — BP 150/80 | HR 100 | Temp 97.8°F | Wt 118.0 lb

## 2013-04-14 DIAGNOSIS — R0989 Other specified symptoms and signs involving the circulatory and respiratory systems: Secondary | ICD-10-CM

## 2013-04-14 DIAGNOSIS — I10 Essential (primary) hypertension: Secondary | ICD-10-CM

## 2013-04-14 DIAGNOSIS — E119 Type 2 diabetes mellitus without complications: Secondary | ICD-10-CM

## 2013-04-14 DIAGNOSIS — E785 Hyperlipidemia, unspecified: Secondary | ICD-10-CM

## 2013-04-14 LAB — COMPREHENSIVE METABOLIC PANEL
AST: 17 U/L (ref 0–37)
Alkaline Phosphatase: 75 U/L (ref 39–117)
CO2: 26 mEq/L (ref 19–32)
Calcium: 9.4 mg/dL (ref 8.4–10.5)
GFR: 118.94 mL/min (ref >60.00)
Glucose, Bld: 144 mg/dL — ABNORMAL HIGH (ref 70–99)
Potassium: 4 mEq/L (ref 3.5–5.1)
Sodium: 140 mEq/L (ref 135–145)

## 2013-04-14 LAB — HEMOGLOBIN A1C: Hgb A1c MFr Bld: 7.2 % — ABNORMAL HIGH (ref 4.6–6.5)

## 2013-04-14 LAB — LIPID PANEL: Total CHOL/HDL Ratio: 3

## 2013-04-14 MED ORDER — METFORMIN HCL 500 MG PO TABS: 500.0000 mg | ORAL_TABLET | Freq: Two times a day (BID) | ORAL | Status: DC

## 2013-04-14 MED ORDER — AMLODIPINE BESYLATE 5 MG PO TABS: 5.0000 mg | ORAL_TABLET | Freq: Every day | ORAL | Status: DC

## 2013-04-14 NOTE — Assessment & Plan Note (Addendum)
I have recommend changing amlodipine to enalapril given her need for increased control with next refill but she is resistant to change.  Increasing the amlodiine to 5 mg daily and warned of the possible S/e of edema.

## 2013-04-14 NOTE — Progress Notes (Signed)
Pre visit review using our clinic review tool, if applicable. No additional management support is needed unless otherwise documented below in the visit note. 

## 2013-04-14 NOTE — Progress Notes (Signed)
Patient ID: Dawn Wiley, female   DOB: 10-12-1934, 78 y.o.   MRN: [id number]  Patient Active Problem List   Diagnosis Date Noted  . Carotid artery bruit 01/13/2013  . Nephrolithiasis   . 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 Preop cardiovascular exam 04/01/2012  . Hyperlipidemia   . Diabetes mellitus without complication   . Tachycardia 03/15/2012  . Dyspnea 03/15/2012  . Hypertension 03/15/2012    Subjective:  CC:   Chief Complaint  Patient presents with  . Follow-up    HPI:   Dawn Wiley a 78 y.o. female who presents 3 month follow up on DM type 2 , hypertension and  Hyperlipidemia.    On her own she has  adjusted metformin  Administration to accomodate her largest meal which is between 12 and 2 PM.  She has  Been checking sugars excessively (4times daily ) since she made the change but did not bring her BS log with her today.  From memory she recalls that her morning blood sugar today was 88 fasting .  2 hr post prandials after largest meals   Have been 155  At the highest.,   Blood pressures at home have been averaging around 130/76  Hr 77. She brought her home BP monitor with here today fro comparison .  Her systolic pressure using the office machine is 150  And is 156 with her cuff        Past Medical History  Diagnosis Date  . Kidney stones   . Hyperlipidemia   . Coronary artery disease     Coronary calcifications noted on CT scan  . Diabetes mellitus without complication   . Nephrolithiasis     Past Surgical History  Procedure Laterality Date  . Temporomandibular joint surgery    . Cataract extraction      bilateral       The following portions of the patient's history were reviewed and updated as appropriate: Allergies, current medications, and problem list.    Review of Systems:   12 Pt  review of systems was negative except those addressed in the HPI,     History   Social History  . Marital Status: Widowed    Spouse Name:  N/A    Number of Children: N/A  . Years of Education: N/A   Occupational History  . Not on file.   Social History Main Topics  . Smoking status: Never Smoker   . Smokeless tobacco: Not on file  . Alcohol Use: No  . Drug Use: No  . Sexual Activity:    Other Topics Concern  . Not on file   Social History Narrative  . No narrative on file    Objective:  Filed Vitals:   04/14/13 1022  BP: 150/80  Pulse: 100  Temp: 97.8 F (36.6 C)     General appearance: alert, cooperative and appears stated age Ears: normal TM's and external ear canals both ears Throat: lips, mucosa, and tongue normal; teeth and gums normal Neck: no adenopathy, no carotid bruit, supple, symmetrical, trachea midline and thyroid not enlarged, symmetric, no tenderness/mass/nodules Back: symmetric, no curvature. ROM normal. No CVA tenderness. Lungs: clear to auscultation bilaterally Heart: regular rate and rhythm, S1, S2 normal, no murmur, click, rub or gallop Abdomen: soft, non-tender; bowel sounds normal; no masses,  no organomegaly Pulses: 2+ and symmetric Skin: Skin color, texture, turgor normal. No rashes or lesions Lymph nodes: Cervical, supraclavicular, and axillary nodes normal.  Assessment and Plan:  Hypertension I have  recommend changing amlodipine to enalapril given her need for increased control with next refill but she is resistant to change.  Increasing the amlodiine to 5 mg daily and warned of the possible S/e of edema.   Diabetes mellitus without complication Well-controlled on current medications.  hemoglobin A1c has been consistently less than 8.0 . She is up-to-date on eye exams and his foot exam is norma. No history of proteinuria, so amlodipine has been her BP of choice,  Discussed changing to ACE Inhibitor but she is resistant to change. l'll repeat his urine microalbumin to creatinine ratio at next visit. Lab Results  Component Value Date   HGBA1C 7.2* 04/14/2013   Lab Results   Component Value Date   MICROALBUR 0.6 01/13/2013     Hyperlipidemia Using the Framingham risk calculator,  her 10 year risk of coronary artery disease is 35%.New ACC guidelines recommend starting patients aged 2 or higher on moderate intensity statin therapy for diabetes and concurrent LDL between 70-189. Will recommend trial of statin therapy. She is already taking an aspirin and has refused carotid artery evaluation with ultrasound for auditory carotid bruits.   A total of 40 minutes was spent with patient more than half of which was spent in counseling, reviewing records and recommendations with patient  and coordination of care.  Updated Medication List Outpatient Encounter Prescriptions as of 04/14/2013  Medication Sig  . acetaminophen (TYLENOL) 500 MG tablet Take 500 mg by mouth every 6 (six) hours as needed.  [name] Kitchen amLODipine (NORVASC) 5 MG tablet Take 1 tablet (5 mg total) by mouth daily.  [name] Kitchen aspirin 81 MG tablet Take 1 tablet (81 mg total) by mouth daily.  [name] Kitchen glucose blood (TRUETEST TEST) test strip Use one strip each time glucose levels are to be tested.  [name] Kitchen glucose monitoring kit (FREESTYLE) monitoring kit 1 each by Does not apply route as needed for other. MAY CHOOSE ANY GLUCOMETER COVERED BY HER INSURANCE  . Lancets (SAFETY LANCET 28G/PRESSURE ACT) MISC 1 each by Other route as needed (to test glucose levels).  . metFORMIN (GLUCOPHAGE) 500 MG tablet Take 1 tablet (500 mg total) by mouth 2 (two) times daily with a meal.  . metoprolol tartrate (LOPRESSOR) 25 MG tablet take 1 tablet by mouth twice a day  . metoprolol tartrate (LOPRESSOR) 25 MG tablet Take 1 tablet (25 mg total) by mouth 2 (two) times daily.  . [DISCONTINUED] amLODipine (NORVASC) 2.5 MG tablet Take 1 tablet (2.5 mg total) by mouth daily.  . [DISCONTINUED] metFORMIN (GLUCOPHAGE) 500 MG tablet Take 1 tablet (500 mg total) by mouth 2 (two) times daily with a meal.     Orders Placed This Encounter  Procedures  . Lipid  panel  . Hemoglobin A1c  . Comprehensive metabolic panel    No Follow-up on file.

## 2013-04-14 NOTE — Patient Instructions (Addendum)
I am recommending that we switch your amlodipine  To enalapril when you are due for refills. This will require repeating your kidneyfunction test so I would wait until a week before your next visit with me in 3 months.  Call for the prescription   Enalapril is highly recommended for patients with diabetes and hypertension   In the meantime increase the amlodipine to 5 mg daily ( you may develop leg swelling which is a common side effect)   Keep doing what you are doing with the metformin   If you decide to have the carotid arteries evaluated for blockages,  Please let me know .

## 2013-04-17 ENCOUNTER — Encounter: Payer: Self-pay | Admitting: Internal Medicine

## 2013-04-17 NOTE — Assessment & Plan Note (Addendum)
Well-controlled on current medications.  hemoglobin A1c has been consistently less than 8.0 . She is up-to-date on eye exams and his foot exam is norma. No history of proteinuria, so amlodipine has been her BP of choice,  Discussed changing to ACE Inhibitor but she is resistant to change. l'll repeat his urine microalbumin to creatinine ratio at next visit. Lab Results  Component Value Date   HGBA1C 7.2* 04/14/2013   Lab Results  Component Value Date   MICROALBUR 0.6 01/13/2013

## 2013-04-17 NOTE — Assessment & Plan Note (Addendum)
Using the Framingham risk calculator,  her 10 year risk of coronary artery disease is 35%.New ACC guidelines recommend starting patients aged 76 or higher on moderate intensity statin therapy for diabetes and concurrent LDL between 70-189. Will recommend trial of statin therapy. She is already taking an aspirin and has refused carotid artery evaluation with ultrasound for auditory carotid bruits.

## 2013-05-29 ENCOUNTER — Other Ambulatory Visit: Payer: Self-pay | Admitting: Internal Medicine

## 2013-05-30 ENCOUNTER — Telehealth: Payer: Self-pay | Admitting: *Deleted

## 2013-05-30 MED ORDER — SAFETY LANCET 28G/PRESSURE ACT MISC: 1.0000 | Status: DC | PRN

## 2013-05-30 NOTE — Telephone Encounter (Signed)
Pt called upset because she went to the pharmacy over the weekend and Rx's for Lancets and Test strips were not there and she called this morning and no one called her back. Told pt I am sorry, but Rx for Test strips was sent to pharmacy yesterday and I will send Rx for Lancets now. Pt was rude and upset explained to her again that I did not get a message and that I will do lancets now and Test strips should be ready at pharmacy. Pt verbalized understanding.

## 2013-06-01 ENCOUNTER — Other Ambulatory Visit: Payer: Self-pay | Admitting: Cardiovascular Disease

## 2013-07-13 ENCOUNTER — Ambulatory Visit: Payer: Medicare Other | Admitting: Internal Medicine

## 2013-08-08 ENCOUNTER — Ambulatory Visit (INDEPENDENT_AMBULATORY_CARE_PROVIDER_SITE_OTHER): Payer: Commercial Managed Care - HMO | Admitting: Internal Medicine

## 2013-08-08 ENCOUNTER — Encounter: Payer: Self-pay | Admitting: Internal Medicine

## 2013-08-08 VITALS — BP 126/58 | HR 72 | Temp 98.1°F | Resp 16 | Wt 117.8 lb

## 2013-08-08 DIAGNOSIS — R0989 Other specified symptoms and signs involving the circulatory and respiratory systems: Secondary | ICD-10-CM

## 2013-08-08 DIAGNOSIS — E119 Type 2 diabetes mellitus without complications: Secondary | ICD-10-CM

## 2013-08-08 DIAGNOSIS — M810 Age-related osteoporosis without current pathological fracture: Secondary | ICD-10-CM

## 2013-08-08 DIAGNOSIS — I10 Essential (primary) hypertension: Secondary | ICD-10-CM

## 2013-08-08 DIAGNOSIS — E785 Hyperlipidemia, unspecified: Secondary | ICD-10-CM

## 2013-08-08 DIAGNOSIS — Z789 Other specified health status: Secondary | ICD-10-CM | POA: Insufficient documentation

## 2013-08-08 DIAGNOSIS — Z888 Allergy status to other drugs, medicaments and biological substances status: Secondary | ICD-10-CM

## 2013-08-08 DIAGNOSIS — N200 Calculus of kidney: Secondary | ICD-10-CM

## 2013-08-08 LAB — COMPREHENSIVE METABOLIC PANEL
ALT: 11 U/L (ref 0–35)
AST: 18 U/L (ref 0–37)
Albumin: 4 g/dL (ref 3.5–5.2)
Alkaline Phosphatase: 70 U/L (ref 39–117)
BILIRUBIN TOTAL: 0.6 mg/dL (ref 0.3–1.2)
BUN: 10 mg/dL (ref 6–23)
CALCIUM: 9.4 mg/dL (ref 8.4–10.5)
CO2: 25 meq/L (ref 19–32)
CREATININE: 0.5 mg/dL (ref 0.4–1.2)
Chloride: 104 mEq/L (ref 96–112)
GFR: 143.54 mL/min (ref >60.00)
Glucose, Bld: 127 mg/dL — ABNORMAL HIGH (ref 70–99)
Potassium: 4 mEq/L (ref 3.5–5.1)
Sodium: 140 mEq/L (ref 135–145)
Total Protein: 7.3 g/dL (ref 6.0–8.3)

## 2013-08-08 LAB — LIPID PANEL
CHOL/HDL RATIO: 3
Cholesterol: 165 mg/dL (ref 0–200)
HDL: 65.5 mg/dL (ref >39.00)
LDL Cholesterol: 89 mg/dL (ref 0–99)
TRIGLYCERIDES: 55 mg/dL (ref 0.0–149.0)
VLDL: 11 mg/dL (ref 0.0–40.0)

## 2013-08-08 LAB — HM DIABETES FOOT EXAM: HM DIABETIC FOOT EXAM: NORMAL

## 2013-08-08 LAB — HEMOGLOBIN A1C: Hgb A1c MFr Bld: 6.8 % — ABNORMAL HIGH (ref 4.6–6.5)

## 2013-08-08 MED ORDER — OXYCODONE-ACETAMINOPHEN 5-325 MG PO TABS
1.0000 | ORAL_TABLET | Freq: Three times a day (TID) | ORAL | Status: DC | PRN
Start: 2013-08-08 — End: 2014-08-14

## 2013-08-08 NOTE — Patient Instructions (Addendum)
I have given you a prescription for oxycodone to use for severe pain (kidney stone)  I recommend you have your overdue diabetic eye exam as soon as possible

## 2013-08-08 NOTE — Progress Notes (Signed)
Pre-visit discussion using our clinic review tool. No additional management support is needed unless otherwise documented below in the visit note.  

## 2013-08-08 NOTE — Assessment & Plan Note (Addendum)
Asymptomatic currently.  Requesting prescription for narcotics for next episodes

## 2013-08-08 NOTE — Progress Notes (Addendum)
Patient ID: Dawn Wiley, female   DOB: January 21, 1935, 79 y.o.   MRN: [id number]  Patient Active Problem List   Diagnosis Date Noted  . Statin intolerance 08/08/2013  . Carotid artery bruit 01/13/2013  . Nephrolithiasis   . 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 Preop cardiovascular exam 04/01/2012  . Hyperlipidemia   . Diabetes mellitus without complication   . Tachycardia 03/15/2012  . Dyspnea 03/15/2012  . Hypertension 03/15/2012    Subjective:  CC:   Chief Complaint  Patient presents with  . Follow-up  . Diabetes    HPI:   Dawn Wiley is a 79 y.o. female who presents for 3 month follow up on DM type 2 , hypertension  Hyperlipidemia, and other chronic issues.     Blood sugars averaging 90 to 104.   At last visit she had reduced her metformin to 1/2 tablet in the am .. Takes  metformin  Tablet  before her largest meal in the late afternoon.  Has had no blood sugars  Above 160 unless she eats potatoes  Or ice cream .   Has not had annual dilated eye exam in over a year.   No neuropathy. Overdue for eye exam.  History of bilateral cataract surgery in [place] many years, ago  Does not want eye exam bc she had complications of floaters   HTN:  Taking amlodipine 5 mg  Daily  Blood pressures at home have been averaging around 130/76  Hr 77.   Does not want statin  Due to prior history of intolerance, despite carotid bruit  And discussion of PAD and risk of CVA  Nocturia x 3 which is chronic,  With sleep disruption.   Doesn't drink enough water ,  Prior history of kidney stones on both sides.  Prior eval by Jacqlyn Larsen .  Was lost to follow up with Urology  after her insurance change to  [facility] required a referral from [place] and since she is asymptomatic doesn't want to incur the expense.   Wants to have labs mailed to her   Continues to defer mammogram, bone density  Carotid bruit: has deferred workup    Past Medical History  Diagnosis Date  . Kidney stones   . Hyperlipidemia    . Coronary artery disease     Coronary calcifications noted on CT scan  . Diabetes mellitus without complication   . Nephrolithiasis     Past Surgical History  Procedure Laterality Date  . Temporomandibular joint surgery    . Cataract extraction      bilateral       The following portions of the patient's history were reviewed and updated as appropriate: Allergies, current medications, and problem list.    Review of Systems:   Patient denies headache, fevers, malaise, unintentional weight loss, skin rash, eye pain, sinus congestion and sinus pain, sore throat, dysphagia,  hemoptysis , cough, dyspnea, wheezing, chest pain, palpitations, orthopnea, edema, abdominal pain, nausea, melena, diarrhea, constipation, flank pain, dysuria, hematuria, urinary  Frequency, nocturia, numbness, tingling, seizures,  Focal weakness, Loss of consciousness,  Tremor, insomnia, depression, anxiety, and suicidal ideation.     History   Social History  . Marital Status: Widowed    Spouse Name: N/A    Number of Children: N/A  . Years of Education: N/A   Occupational History  . Not on file.   Social History Main Topics  . Smoking status: Never Smoker   . Smokeless tobacco: Not on file  . Alcohol Use: No  .  Drug Use: No  . Sexual Activity:    Other Topics Concern  . Not on file   Social History Narrative  . No narrative on file    Objective:  Filed Vitals:   08/08/13 1104  BP: 126/58  Pulse: 72  Temp: 98.1 F (36.7 C)  Resp: 16     General appearance: alert, cooperative and appears stated age Ears: normal TM's and external ear canals both ears Throat: lips, mucosa, and tongue normal; teeth and gums normal Neck: no adenopathy, no carotid bruit, supple, symmetrical, trachea midline and thyroid not enlarged, symmetric, no tenderness/mass/nodules Back: symmetric, no curvature. ROM normal. No CVA tenderness. Lungs: clear to auscultation bilaterally Heart: regular rate and  rhythm, S1, S2 normal, no murmur, click, rub or gallop Abdomen: soft, non-tender; bowel sounds normal; no masses,  no organomegaly Pulses: 2+ and symmetric Skin: Skin color, texture, turgor normal. No rashes or lesions Lymph nodes: Cervical, supraclavicular, and axillary nodes normal.  Assessment and Plan:  Nephrolithiasis Asymptomatic currently.  Requesting prescription for narcotics for next episodes   Hypertension Well controlled on current regimen. Renal function stable, no changes today. Lab Results  Component Value Date   CREATININE 0.5 08/08/2013     Diabetes mellitus without complication Well-controlled on current medications.  hemoglobin A1c has been consistently less than 8.0 . She is  NOT up-to-date on eye exams .  Her  foot exam is norma. No history of proteinuria, so amlodipine has been her BP of choice,  Discussed changing to ACE Inhibitor but she is resistant to change. l'll repeat his urine microalbumin to creatinine ratio at next visit. She has also continued to decline Pneumovax and influenza vaccines. Lab Results  Component Value Date   HGBA1C 6.8* 08/08/2013   Lab Results  Component Value Date   MICROALBUR 0.6 01/13/2013       Osteoporosis, post-menopausal Suggested by presence of compression fractures of L1 and L4 seen on previous noncontrasted CT .  She has declined  DEXA and treatment since she is asymptomatic.    Statin intolerance Discussed again in light of DM and carotid bruit.  Not interested in statins.   Lab Results  Component Value Date   CHOL 165 08/08/2013   HDL 65.50 08/08/2013   LDLCALC 89 08/08/2013   TRIG 55.0 08/08/2013   CHOLHDL 3 08/08/2013    Hyperlipidemia New ACC guidelines recommend starting patients aged 62 or higher on moderate intensity statin therapy for diabetes and concurrent LDL between 70-189. She is opposed to repeating a trial of statin therapy.  Lab Results  Component Value Date   CHOL 165 08/08/2013   HDL 65.50  08/08/2013   LDLCALC 89 08/08/2013   TRIG 55.0 08/08/2013   CHOLHDL 3 08/08/2013     Carotid artery bruit Referral for dopplers offered again but deferred by patient.   A total of 40 minutes was spent with patient more than half of which was spent in counseling, reviewing records from other prviders and coordination of care.  Updated Medication List Outpatient Encounter Prescriptions as of 08/08/2013  Medication Sig  . acetaminophen (TYLENOL) 500 MG tablet Take 500 mg by mouth every 6 (six) hours as needed.  [name] Kitchen amLODipine (NORVASC) 5 MG tablet Take 1 tablet (5 mg total) by mouth daily.  [name] Kitchen aspirin 81 MG tablet Take 1 tablet (81 mg total) by mouth daily.  [name] Kitchen glucose monitoring kit (FREESTYLE) monitoring kit 1 each by Does not apply route as needed for other. MAY CHOOSE ANY GLUCOMETER  COVERED BY HER INSURANCE  . Lancets (SAFETY LANCET 28G/PRESSURE ACT) MISC 1 each by Other route as needed (to test glucose levels).  . metFORMIN (GLUCOPHAGE) 500 MG tablet Take 1 tablet (500 mg total) by mouth 2 (two) times daily with a meal.  . metoprolol tartrate (LOPRESSOR) 25 MG tablet take 1 tablet by mouth twice a day  . metoprolol tartrate (LOPRESSOR) 25 MG tablet Take 1 tablet (25 mg total) by mouth 2 (two) times daily.  . TRUETEST TEST test strip TEST BLOOD SUGAR every morning and after meals as directed by prescriber UINTIL RETURN TO DOCTOR  . oxyCODONE-acetaminophen (ROXICET) 5-325 MG per tablet Take 1 tablet by mouth every 8 (eight) hours as needed for severe pain.  . [DISCONTINUED] amLODipine (NORVASC) 2.5 MG tablet take 1 tablet by mouth once daily     Orders Placed This Encounter  Procedures  . Comprehensive metabolic panel  . Hemoglobin A1c  . Lipid panel  . HM DIABETES FOOT EXAM    Return in about 6 months (around 02/07/2014).

## 2013-08-09 ENCOUNTER — Encounter: Payer: Self-pay | Admitting: *Deleted

## 2013-08-09 NOTE — Assessment & Plan Note (Signed)
Well controlled on current regimen. Renal function stable, no changes today. Lab Results  Component Value Date   CREATININE 0.5 08/08/2013

## 2013-08-09 NOTE — Assessment & Plan Note (Signed)
Referral for dopplers offered again but deferred by patient.

## 2013-08-09 NOTE — Assessment & Plan Note (Signed)
New ACC guidelines recommend starting patients aged 77 or higher on moderate intensity statin therapy for diabetes and concurrent LDL between 70-189. She is opposed to repeating a trial of statin therapy.  Lab Results  Component Value Date   CHOL 165 08/08/2013   HDL 65.50 08/08/2013   LDLCALC 89 08/08/2013   TRIG 55.0 08/08/2013   CHOLHDL 3 08/08/2013

## 2013-08-09 NOTE — Assessment & Plan Note (Signed)
Well-controlled on current medications.  hemoglobin A1c has been consistently less than 8.0 . She is  NOT up-to-date on eye exams .  Her  foot exam is norma. No history of proteinuria, so amlodipine has been her BP of choice,  Discussed changing to ACE Inhibitor but she is resistant to change. l'll repeat his urine microalbumin to creatinine ratio at next visit. Lab Results  Component Value Date   HGBA1C 6.8* 08/08/2013   Lab Results  Component Value Date   MICROALBUR 0.6 01/13/2013

## 2013-08-09 NOTE — Assessment & Plan Note (Signed)
Discussed again in light of DM and carotid bruit.  Not interested in statins.   Lab Results  Component Value Date   CHOL 165 08/08/2013   HDL 65.50 08/08/2013   LDLCALC 89 08/08/2013   TRIG 55.0 08/08/2013   CHOLHDL 3 08/08/2013

## 2013-08-09 NOTE — Assessment & Plan Note (Addendum)
Suggested by presence of compression fractures of L1 and L4 seen on previous noncontrasted CT .  She has declined  DEXA and treatment since she is asymptomatic.

## 2013-08-23 ENCOUNTER — Telehealth: Payer: Self-pay

## 2013-08-23 NOTE — Telephone Encounter (Signed)
Relevant patient education mailed to patient.  

## 2013-12-30 ENCOUNTER — Telehealth: Payer: Self-pay | Admitting: Internal Medicine

## 2013-12-30 MED ORDER — METOPROLOL TARTRATE 25 MG PO TABS: ORAL_TABLET | ORAL | Status: DC

## 2013-12-30 NOTE — Telephone Encounter (Signed)
Refill sent as requested. 

## 2013-12-30 NOTE — Telephone Encounter (Signed)
Pt needs refill on metoprolol 25mg  (90 day script). Pt will be complete out on Monday.msn

## 2014-02-09 ENCOUNTER — Ambulatory Visit (INDEPENDENT_AMBULATORY_CARE_PROVIDER_SITE_OTHER): Payer: Commercial Managed Care - HMO | Admitting: Internal Medicine

## 2014-02-09 ENCOUNTER — Encounter: Payer: Self-pay | Admitting: Internal Medicine

## 2014-02-09 VITALS — BP 132/66 | HR 74 | Temp 97.9°F | Resp 14 | Ht 60.5 in | Wt 113.8 lb

## 2014-02-09 DIAGNOSIS — I10 Essential (primary) hypertension: Secondary | ICD-10-CM

## 2014-02-09 DIAGNOSIS — E559 Vitamin D deficiency, unspecified: Secondary | ICD-10-CM

## 2014-02-09 DIAGNOSIS — D6489 Other specified anemias: Secondary | ICD-10-CM

## 2014-02-09 DIAGNOSIS — Z79899 Other long term (current) drug therapy: Secondary | ICD-10-CM

## 2014-02-09 DIAGNOSIS — M810 Age-related osteoporosis without current pathological fracture: Secondary | ICD-10-CM

## 2014-02-09 DIAGNOSIS — E785 Hyperlipidemia, unspecified: Secondary | ICD-10-CM

## 2014-02-09 DIAGNOSIS — E119 Type 2 diabetes mellitus without complications: Secondary | ICD-10-CM

## 2014-02-09 DIAGNOSIS — R5383 Other fatigue: Secondary | ICD-10-CM

## 2014-02-09 LAB — CBC WITH DIFFERENTIAL/PLATELET
BASOS PCT: 1.2 % (ref 0.0–3.0)
Basophils Absolute: 0.1 10*3/uL (ref 0.0–0.1)
EOS ABS: 0.1 10*3/uL (ref 0.0–0.7)
Eosinophils Relative: 2.1 % (ref 0.0–5.0)
HCT: 27.5 % — ABNORMAL LOW (ref 36.0–46.0)
Hemoglobin: 8.1 g/dL — ABNORMAL LOW (ref 12.0–15.0)
LYMPHS PCT: 15.6 % (ref 12.0–46.0)
Lymphs Abs: 1 10*3/uL (ref 0.7–4.0)
MCHC: 29.5 g/dL — AB (ref 30.0–36.0)
MCV: 68 fl — ABNORMAL LOW (ref 78.0–100.0)
Monocytes Absolute: 0.5 10*3/uL (ref 0.1–1.0)
Monocytes Relative: 8.1 % (ref 3.0–12.0)
NEUTROS PCT: 73 % (ref 43.0–77.0)
Neutro Abs: 4.4 10*3/uL (ref 1.4–7.7)
Platelets: 339 10*3/uL (ref 150.0–400.0)
RBC: 4.04 Mil/uL (ref 3.87–5.11)
RDW: 17.6 % — AB (ref 11.5–15.5)
WBC: 6.1 10*3/uL (ref 4.0–10.5)

## 2014-02-09 LAB — TSH: TSH: 1.12 u[IU]/mL (ref 0.35–4.50)

## 2014-02-09 LAB — LIPID PANEL
CHOL/HDL RATIO: 3
CHOLESTEROL: 188 mg/dL (ref 0–200)
HDL: 60.5 mg/dL (ref >39.00)
LDL CALC: 116 mg/dL — AB (ref 0–99)
NonHDL: 127.5
Triglycerides: 57 mg/dL (ref 0.0–149.0)
VLDL: 11.4 mg/dL (ref 0.0–40.0)

## 2014-02-09 LAB — MICROALBUMIN / CREATININE URINE RATIO
CREATININE, U: 94.8 mg/dL
MICROALB UR: 0.9 mg/dL (ref 0.0–1.9)
MICROALB/CREAT RATIO: 0.9 mg/g (ref 0.0–30.0)

## 2014-02-09 LAB — HEMOGLOBIN A1C: Hgb A1c MFr Bld: 6.8 % — ABNORMAL HIGH (ref 4.6–6.5)

## 2014-02-09 LAB — VITAMIN D 25 HYDROXY (VIT D DEFICIENCY, FRACTURES): VITD: 25.32 ng/mL — AB (ref 30.00–100.00)

## 2014-02-09 NOTE — Patient Instructions (Signed)
You are doing well.  If your  A1c is < 6.5 this time,  i will allow yo to stop the metformin for 6 months   PLEASE go get your eyes examined !

## 2014-02-09 NOTE — Progress Notes (Signed)
Pre-visit discussion using our clinic review tool. No additional management support is needed unless otherwise documented below in the visit note.  

## 2014-02-09 NOTE — Progress Notes (Signed)
Patient ID: Dawn Wiley, female   DOB: 09-01-1934, 79 y.o.   MRN: [id number]  Patient Active Problem List   Diagnosis Date Noted  . Fatigue 02/12/2014  . Anemia 02/12/2014  . Statin intolerance 08/08/2013  . Carotid artery bruit 01/13/2013  . Nephrolithiasis   . 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 Preop cardiovascular exam 04/01/2012  . Hyperlipidemia   . Diabetes mellitus without complication   . Tachycardia 03/15/2012  . Dyspnea 03/15/2012  . Hypertension 03/15/2012    Subjective:  CC:   Chief Complaint  Patient presents with  . Follow-up    6 month,  A1C Patient is fasting.    HPI:   Dawn Wiley is a 79 y.o. female who presents for   3 month follow up on DM type 2 without complications,  Hypertension and hyperlipidemia.  She feels fine and has been checking her blood sugars once daily with fasting sugars all < 100.   Her home blood pressures  have also been normal     bp 124/75   78     109  BP 136/78 81   Taking metform one table twice daily     Refuses vaccines, despite discussion   Has  Been having episodes of weakness  occurring suddenly  During her morning walks lasting a minute or so.  sHe denies chest pain and dyspnea.     Past Medical History  Diagnosis Date  . Kidney stones   . Hyperlipidemia   . Coronary artery disease     Coronary calcifications noted on CT scan  . Diabetes mellitus without complication   . Nephrolithiasis     Past Surgical History  Procedure Laterality Date  . Temporomandibular joint surgery    . Cataract extraction      bilateral       The following portions of the patient's history were reviewed and updated as appropriate: Allergies, current medications, and problem list.    Review of Systems:   Patient denies headache, fevers, malaise, unintentional weight loss, skin rash, eye pain, sinus congestion and sinus pain, sore throat, dysphagia,  hemoptysis , cough, dyspnea, wheezing, chest pain, palpitations,  orthopnea, edema, abdominal pain, nausea, melena, diarrhea, constipation, flank pain, dysuria, hematuria, urinary  Frequency, nocturia, numbness, tingling, seizures,  Focal weakness, Loss of consciousness,  Tremor, insomnia, depression, anxiety, and suicidal ideation.     History   Social History  . Marital Status: Widowed    Spouse Name: N/A    Number of Children: N/A  . Years of Education: N/A   Occupational History  . Not on file.   Social History Main Topics  . Smoking status: Never Smoker   . Smokeless tobacco: Not on file  . Alcohol Use: No  . Drug Use: No  . Sexual Activity:    Other Topics Concern  . Not on file   Social History Narrative  . No narrative on file    Objective:  Filed Vitals:   02/09/14 1058  BP: 132/66  Pulse: 74  Temp: 97.9 F (36.6 C)  Resp: 14     General appearance: alert, cooperative and appears stated age Ears: normal TM's and external ear canals both ears Throat: lips, mucosa, and tongue normal; teeth and gums normal Neck: no adenopathy, no carotid bruit, supple, symmetrical, trachea midline and thyroid not enlarged, symmetric, no tenderness/mass/nodules Back: symmetric, no curvature. ROM normal. No CVA tenderness. Lungs: clear to auscultation bilaterally Heart: regular rate and rhythm, S1, S2 normal, no murmur, click, rub  or gallop Abdomen: soft, non-tender; bowel sounds normal; no masses,  no organomegaly Pulses: 2+ and symmetric Skin: Skin color, texture, turgor normal. No rashes or lesions Lymph nodes: Cervical, supraclavicular, and axillary nodes normal.  Assessment and Plan:  Fatigue Workup underway and thus far suggests microcytic anemia as the cause   Anemia Microcytic, etiology unclear., she has continually deferred colon ca screening and has no history of gastritis.  Will need iron studies ,  FOBT etc   Hyperlipidemia New ACC guidelines recommend starting patients aged 40 or higher on moderate intensity statin  therapy for diabetes and concurrent LDL between 70-189. She is opposed to repeating a trial of statin therapy.  Lab Results  Component Value Date   CHOL 188 02/09/2014   HDL 60.50 02/09/2014   LDLCALC 116* 02/09/2014   TRIG 57.0 02/09/2014   CHOLHDL 3 02/09/2014       Hypertension Well controlled on current regimen. Renal function is due, no changes today.   Lab Results  Component Value Date   CREATININE 0.5 08/08/2013     Diabetes mellitus without complication Well-controlled on current medications.  hemoglobin A1c has been consistently less than 7.0 . She is  NOT up-to-date on eye exams .  Her  foot exam is normal. No history of proteinuria, so amlodipine has been her BP of choice,  Discussed changing to ACE Inhibitor but she is resistant to change. l Lab Results  Component Value Date   HGBA1C 6.8* 02/09/2014   Lab Results  Component Value Date   MICROALBUR 0.9 02/09/2014        A total of 40 minutes was spent with patient more than half of which was spent in counseling patient on the above mentioned issues  and coordination of care.   Updated Medication List Outpatient Encounter Prescriptions as of 02/09/2014  Medication Sig  . acetaminophen (TYLENOL) 500 MG tablet Take 500 mg by mouth every 6 (six) hours as needed.  [name] Kitchen amLODipine (NORVASC) 5 MG tablet Take 1 tablet (5 mg total) by mouth daily.  [name] Kitchen aspirin 81 MG tablet Take 1 tablet (81 mg total) by mouth daily.  [name] Kitchen glucose monitoring kit (FREESTYLE) monitoring kit 1 each by Does not apply route as needed for other. MAY CHOOSE ANY GLUCOMETER COVERED BY HER INSURANCE  . Lancets (SAFETY LANCET 28G/PRESSURE ACT) MISC 1 each by Other route as needed (to test glucose levels).  . metFORMIN (GLUCOPHAGE) 500 MG tablet Take 1 tablet (500 mg total) by mouth 2 (two) times daily with a meal.  . metoprolol tartrate (LOPRESSOR) 25 MG tablet Take 1 tablet (25 mg total) by mouth 2 (two) times daily.  [name] Kitchen oxyCODONE-acetaminophen  (ROXICET) 5-325 MG per tablet Take 1 tablet by mouth every 8 (eight) hours as needed for severe pain.  . TRUETEST TEST test strip TEST BLOOD SUGAR every morning and after meals as directed by prescriber UINTIL RETURN TO DOCTOR  . [DISCONTINUED] metoprolol tartrate (LOPRESSOR) 25 MG tablet take 1 tablet by mouth twice a day     Orders Placed This Encounter  Procedures  . Fecal occult blood, imunochemical  . CBC with Differential  . TSH  . Lipid panel  . Vit D  25 hydroxy (rtn osteoporosis monitoring)  . Microalbumin / creatinine urine ratio  . Hemoglobin A1c  . Comprehensive metabolic panel  . Ferritin  . Iron and TIBC  . B12  . Folate RBC  . Protein electrophoresis, serum    Return in about 6 months (around 08/11/2014).

## 2014-02-10 ENCOUNTER — Telehealth: Payer: Self-pay | Admitting: Internal Medicine

## 2014-02-10 NOTE — Telephone Encounter (Signed)
emmi mailed  °

## 2014-02-12 ENCOUNTER — Encounter: Payer: Self-pay | Admitting: Internal Medicine

## 2014-02-12 DIAGNOSIS — D649 Anemia, unspecified: Secondary | ICD-10-CM | POA: Insufficient documentation

## 2014-02-12 DIAGNOSIS — R5383 Other fatigue: Secondary | ICD-10-CM | POA: Insufficient documentation

## 2014-02-12 NOTE — Assessment & Plan Note (Signed)
New ACC guidelines recommend starting patients aged 77 or higher on moderate intensity statin therapy for diabetes and concurrent LDL between 70-189. She is opposed to repeating a trial of statin therapy.  Lab Results  Component Value Date   CHOL 188 02/09/2014   HDL 60.50 02/09/2014   LDLCALC 116* 02/09/2014   TRIG 57.0 02/09/2014   CHOLHDL 3 02/09/2014

## 2014-02-12 NOTE — Assessment & Plan Note (Signed)
Well-controlled on current medications.  hemoglobin A1c has been consistently less than 7.0 . She is  NOT up-to-date on eye exams .  Her  foot exam is normal. No history of proteinuria, so amlodipine has been her BP of choice,  Discussed changing to ACE Inhibitor but she is resistant to change. l Lab Results  Component Value Date   HGBA1C 6.8* 02/09/2014   Lab Results  Component Value Date   MICROALBUR 0.9 02/09/2014

## 2014-02-12 NOTE — Assessment & Plan Note (Addendum)
Well controlled on current regimen. Renal function is due, no changes today.   Lab Results  Component Value Date   CREATININE 0.5 08/08/2013

## 2014-02-12 NOTE — Assessment & Plan Note (Addendum)
Microcytic, etiology unclear., she has continually deferred colon ca screening and has no history of gastritis.  Will need iron studies ,  FOBT etc

## 2014-02-12 NOTE — Assessment & Plan Note (Signed)
Workup underway and thus far suggests microcytic anemia as the cause

## 2014-02-13 ENCOUNTER — Encounter: Payer: Self-pay | Admitting: *Deleted

## 2014-02-14 ENCOUNTER — Other Ambulatory Visit (INDEPENDENT_AMBULATORY_CARE_PROVIDER_SITE_OTHER): Payer: Commercial Managed Care - HMO

## 2014-02-14 DIAGNOSIS — D509 Iron deficiency anemia, unspecified: Secondary | ICD-10-CM

## 2014-02-14 DIAGNOSIS — D6489 Other specified anemias: Secondary | ICD-10-CM

## 2014-02-14 LAB — VITAMIN B12: Vitamin B-12: 101 pg/mL — ABNORMAL LOW (ref 211–911)

## 2014-02-14 LAB — COMPREHENSIVE METABOLIC PANEL
ALT: 11 U/L (ref 0–35)
AST: 17 U/L (ref 0–37)
Albumin: 3.5 g/dL (ref 3.5–5.2)
Alkaline Phosphatase: 73 U/L (ref 39–117)
BUN: 11 mg/dL (ref 6–23)
CALCIUM: 9.6 mg/dL (ref 8.4–10.5)
CO2: 21 meq/L (ref 19–32)
Chloride: 104 mEq/L (ref 96–112)
Creatinine, Ser: 0.5 mg/dL (ref 0.4–1.2)
GFR: 116.14 mL/min (ref >60.00)
Glucose, Bld: 220 mg/dL — ABNORMAL HIGH (ref 70–99)
Potassium: 4 mEq/L (ref 3.5–5.1)
Sodium: 138 mEq/L (ref 135–145)
Total Bilirubin: 0.5 mg/dL (ref 0.2–1.2)
Total Protein: 7.3 g/dL (ref 6.0–8.3)

## 2014-02-14 LAB — IRON AND TIBC
%SAT: 3 % — ABNORMAL LOW (ref 20–55)
IRON: 15 ug/dL — AB (ref 42–145)
TIBC: 515 ug/dL — ABNORMAL HIGH (ref 250–470)
UIBC: 500 ug/dL — ABNORMAL HIGH (ref 125–400)

## 2014-02-14 LAB — FERRITIN: Ferritin: 10.8 ng/mL (ref 10.0–291.0)

## 2014-02-15 LAB — FOLATE RBC: RBC Folate: 1335 ng/mL (ref >280)

## 2014-02-16 LAB — PROTEIN ELECTROPHORESIS, SERUM
ALBUMIN ELP: 57.6 % (ref 55.8–66.1)
ALPHA-1-GLOBULIN: 4 % (ref 2.9–4.9)
Alpha-2-Globulin: 11 % (ref 7.1–11.8)
BETA 2: 5.2 % (ref 3.2–6.5)
BETA GLOBULIN: 8.2 % — AB (ref 4.7–7.2)
Gamma Globulin: 14 % (ref 11.1–18.8)
Total Protein, Serum Electrophoresis: 6.6 g/dL (ref 6.0–8.3)

## 2014-02-20 NOTE — Assessment & Plan Note (Signed)
patient declining GI workup for iron deficiency.

## 2014-02-21 ENCOUNTER — Ambulatory Visit (INDEPENDENT_AMBULATORY_CARE_PROVIDER_SITE_OTHER): Payer: Commercial Managed Care - HMO | Admitting: *Deleted

## 2014-02-21 ENCOUNTER — Telehealth: Payer: Self-pay | Admitting: *Deleted

## 2014-02-21 DIAGNOSIS — D509 Iron deficiency anemia, unspecified: Secondary | ICD-10-CM

## 2014-02-21 DIAGNOSIS — E538 Deficiency of other specified B group vitamins: Secondary | ICD-10-CM

## 2014-02-21 MED ORDER — CYANOCOBALAMIN 1000 MCG/ML IJ SOLN
1000.0000 ug | Freq: Once | INTRAMUSCULAR | Status: AC
  Administered 2014-02-21: 1000 ug via INTRAMUSCULAR

## 2014-02-21 NOTE — Telephone Encounter (Signed)
Pt aware recent labs showed she was iron deficient. Does not want to do a colonoscopy. Wants to know what else she should do at this point?

## 2014-02-21 NOTE — Telephone Encounter (Signed)
Come in to pick up a take home stool test (FOBT) and start taking ferrrous sulfate once daily with meals.  Available otc  324 mg    And start the b12 injections

## 2014-02-22 ENCOUNTER — Ambulatory Visit (INDEPENDENT_AMBULATORY_CARE_PROVIDER_SITE_OTHER): Payer: Commercial Managed Care - HMO | Admitting: *Deleted

## 2014-02-22 DIAGNOSIS — E538 Deficiency of other specified B group vitamins: Secondary | ICD-10-CM

## 2014-02-22 MED ORDER — CYANOCOBALAMIN 1000 MCG/ML IJ SOLN
1000.0000 ug | Freq: Once | INTRAMUSCULAR | Status: AC
  Administered 2014-02-22: 1000 ug via INTRAMUSCULAR

## 2014-02-22 NOTE — Telephone Encounter (Signed)
Pt notified and  verbalized understanding. iFob given

## 2014-02-23 ENCOUNTER — Ambulatory Visit (INDEPENDENT_AMBULATORY_CARE_PROVIDER_SITE_OTHER): Payer: Commercial Managed Care - HMO | Admitting: *Deleted

## 2014-02-23 DIAGNOSIS — E538 Deficiency of other specified B group vitamins: Secondary | ICD-10-CM

## 2014-02-23 MED ORDER — CYANOCOBALAMIN 1000 MCG/ML IJ SOLN
1000.0000 ug | Freq: Once | INTRAMUSCULAR | Status: AC
  Administered 2014-02-23: 1000 ug via INTRAMUSCULAR

## 2014-02-24 ENCOUNTER — Ambulatory Visit (INDEPENDENT_AMBULATORY_CARE_PROVIDER_SITE_OTHER): Payer: Commercial Managed Care - HMO | Admitting: *Deleted

## 2014-02-24 DIAGNOSIS — E538 Deficiency of other specified B group vitamins: Secondary | ICD-10-CM

## 2014-02-24 MED ORDER — CYANOCOBALAMIN 1000 MCG/ML IJ SOLN
1000.0000 ug | Freq: Once | INTRAMUSCULAR | Status: AC
  Administered 2014-02-24: 1000 ug via INTRAMUSCULAR

## 2014-02-28 ENCOUNTER — Ambulatory Visit (INDEPENDENT_AMBULATORY_CARE_PROVIDER_SITE_OTHER): Payer: Commercial Managed Care - HMO | Admitting: *Deleted

## 2014-02-28 DIAGNOSIS — E538 Deficiency of other specified B group vitamins: Secondary | ICD-10-CM

## 2014-02-28 MED ORDER — CYANOCOBALAMIN 1000 MCG/ML IJ SOLN
1000.0000 ug | Freq: Once | INTRAMUSCULAR | Status: AC
  Administered 2014-02-28: 1000 ug via INTRAMUSCULAR

## 2014-03-02 ENCOUNTER — Other Ambulatory Visit (INDEPENDENT_AMBULATORY_CARE_PROVIDER_SITE_OTHER): Payer: Commercial Managed Care - HMO

## 2014-03-02 DIAGNOSIS — D6489 Other specified anemias: Secondary | ICD-10-CM

## 2014-03-03 LAB — FECAL OCCULT BLOOD, IMMUNOCHEMICAL: Fecal Occult Bld: POSITIVE — AB

## 2014-03-07 ENCOUNTER — Ambulatory Visit (INDEPENDENT_AMBULATORY_CARE_PROVIDER_SITE_OTHER): Payer: Commercial Managed Care - HMO | Admitting: *Deleted

## 2014-03-07 ENCOUNTER — Telehealth: Payer: Self-pay | Admitting: Internal Medicine

## 2014-03-07 DIAGNOSIS — E538 Deficiency of other specified B group vitamins: Secondary | ICD-10-CM

## 2014-03-07 MED ORDER — CYANOCOBALAMIN 1000 MCG/ML IJ SOLN
1000.0000 ug | Freq: Once | INTRAMUSCULAR | Status: AC
  Administered 2014-03-07: 1000 ug via INTRAMUSCULAR

## 2014-03-14 ENCOUNTER — Ambulatory Visit (INDEPENDENT_AMBULATORY_CARE_PROVIDER_SITE_OTHER): Payer: Commercial Managed Care - HMO | Admitting: *Deleted

## 2014-03-14 DIAGNOSIS — I10 Essential (primary) hypertension: Secondary | ICD-10-CM

## 2014-03-14 DIAGNOSIS — E538 Deficiency of other specified B group vitamins: Secondary | ICD-10-CM

## 2014-03-14 DIAGNOSIS — R000 Tachycardia, unspecified: Secondary | ICD-10-CM

## 2014-03-14 MED ORDER — METFORMIN HCL 500 MG PO TABS: 500.0000 mg | ORAL_TABLET | Freq: Two times a day (BID) | ORAL | Status: DC

## 2014-03-14 MED ORDER — METOPROLOL TARTRATE 25 MG PO TABS: 25.0000 mg | ORAL_TABLET | Freq: Two times a day (BID) | ORAL | Status: DC

## 2014-03-14 MED ORDER — CYANOCOBALAMIN 1000 MCG/ML IJ SOLN
1000.0000 ug | Freq: Once | INTRAMUSCULAR | Status: AC
Start: 2014-03-14 — End: 2014-03-14
  Administered 2014-03-14: 1000 ug via INTRAMUSCULAR

## 2014-03-30 ENCOUNTER — Ambulatory Visit (INDEPENDENT_AMBULATORY_CARE_PROVIDER_SITE_OTHER): Payer: Commercial Managed Care - HMO | Admitting: Nurse Practitioner

## 2014-03-30 ENCOUNTER — Encounter: Payer: Self-pay | Admitting: Nurse Practitioner

## 2014-03-30 VITALS — BP 120/62 | HR 75 | Temp 97.8°F | Resp 14 | Ht 60.5 in | Wt 113.5 lb

## 2014-03-30 DIAGNOSIS — L309 Dermatitis, unspecified: Secondary | ICD-10-CM

## 2014-03-30 MED ORDER — TRIAMCINOLONE ACETONIDE 0.1 % EX CREA: 1.0000 "application " | TOPICAL_CREAM | Freq: Two times a day (BID) | CUTANEOUS | Status: DC

## 2014-03-30 NOTE — Progress Notes (Signed)
Subjective:    Patient ID: Dawn Wiley, female    DOB: 16-Aug-1934, 79 y.o.   MRN: [id number]  HPI Dawn Wiley is a 79 yo female with a CC of rash.   1). Rash- Onset 3-4 weeks ago. She noticed pruritic "rash" only on upper back (bilat) and right lower back. It is very slightly red and she states it was raised. It itches even at night. She has not changed detergents, traveled, and does not have pets. It has been mild and has been persistent.  She has tried to date: Benadryl- 1 at night and 1 in am- AM helps most Cortaid 10 and hydrocortisone anti-itch helpful, but not resolving.  Believes related to iron tablet stopped for a week without relief  B-12 shots- new and thought it might be related.     Review of Systems Past Medical History  Diagnosis Date  . Kidney stones   . Hyperlipidemia   . Coronary artery disease     Coronary calcifications noted on CT scan  . Diabetes mellitus without complication   . Nephrolithiasis     History   Social History  . Marital Status: Widowed    Spouse Name: N/A    Number of Children: N/A  . Years of Education: N/A   Occupational History  . Not on file.   Social History Main Topics  . Smoking status: Never Smoker   . Smokeless tobacco: Not on file  . Alcohol Use: No  . Drug Use: No  . Sexual Activity: Not on file   Other Topics Concern  . Not on file   Social History Narrative    Past Surgical History  Procedure Laterality Date  . Temporomandibular joint surgery    . Cataract extraction      bilateral    Family History  Problem Relation Age of Onset  . Peripheral Artery Disease Sister     carotid artery stenosis   . Heart disease Sister   . Diabetes Sister     No Known Allergies  Current Outpatient Prescriptions on File Prior to Visit  Medication Sig Dispense Refill  . acetaminophen (TYLENOL) 500 MG tablet Take 500 mg by mouth every 6 (six) hours as needed.    [name] Kitchen amLODipine (NORVASC) 5 MG tablet Take 1 tablet (5  mg total) by mouth daily. 90 tablet 3  . aspirin 81 MG tablet Take 1 tablet (81 mg total) by mouth daily. 30 tablet   . glucose monitoring kit (FREESTYLE) monitoring kit 1 each by Does not apply route as needed for other. MAY CHOOSE ANY GLUCOMETER COVERED BY HER INSURANCE 1 each 0  . Lancets (SAFETY LANCET 28G/PRESSURE ACT) MISC 1 each by Other route as needed (to test glucose levels). 50 each 12  . metFORMIN (GLUCOPHAGE) 500 MG tablet Take 1 tablet (500 mg total) by mouth 2 (two) times daily with a meal. 180 tablet 1  . metoprolol tartrate (LOPRESSOR) 25 MG tablet Take 1 tablet (25 mg total) by mouth 2 (two) times daily. 180 tablet 1  . oxyCODONE-acetaminophen (ROXICET) 5-325 MG per tablet Take 1 tablet by mouth every 8 (eight) hours as needed for severe pain. 20 tablet 0  . TRUETEST TEST test strip TEST BLOOD SUGAR every morning and after meals as directed by prescriber UINTIL RETURN TO DOCTOR 100 each 5   No current facility-administered medications on file prior to visit.      Objective:   Physical Exam  Cardiovascular: Normal rate and regular rhythm.  Pulmonary/Chest: Effort normal and breath sounds normal.  Skin: Skin is warm and dry. Rash noted. No purpura noted. Rash is not macular, not papular, not maculopapular, not nodular, not pustular, not vesicular and not urticarial. She is not diaphoretic.          BP 120/62 mmHg  Pulse 75  Temp(Src) 97.8 F (36.6 C) (Oral)  Resp 14  Ht 5' 0.5" (1.537 m)  Wt 113 lb 8 oz (51.483 kg)  BMI 21.79 kg/m2  SpO2 97%     Assessment & Plan:

## 2014-03-30 NOTE — Patient Instructions (Signed)
Try the cream, small amount to itchy areas not more than twice a day.  Continue Benadryl if useful. You can add Zyrtec/Claritin/OR Allegra (pick one) daily to help with itchiness. Call us if your symptoms worsen, change, or fail to improve.

## 2014-03-30 NOTE — Assessment & Plan Note (Addendum)
New onset- Dermatitis of unknown origin. Very dry skin on back. Continue Benadry, add zyrtec/claritin/ or allegra to regimen. Rx for Triamcinolone 1% cream. Discussed calling if worsens, changes, or fails to improve.

## 2014-03-30 NOTE — Progress Notes (Signed)
Pre visit review using our clinic review tool, if applicable. No additional management support is needed unless otherwise documented below in the visit note. 

## 2014-04-05 ENCOUNTER — Telehealth: Payer: Self-pay | Admitting: Internal Medicine

## 2014-04-05 MED ORDER — TRIAMCINOLONE ACETONIDE 0.1 % EX CREA: 1.0000 "application " | TOPICAL_CREAM | Freq: Two times a day (BID) | CUTANEOUS | Status: DC

## 2014-04-05 NOTE — Telephone Encounter (Signed)
We can refill the kenalog cream. Remind her to use thin layer. Thanks!

## 2014-04-05 NOTE — Telephone Encounter (Signed)
I spoke with pt, she states she has already used the 15g that was Rxd on 12.3.15.  Please advise

## 2014-04-05 NOTE — Telephone Encounter (Signed)
triamcinolone cream (KENALOG) 0.1 %  The patient is needing this cream today.

## 2014-04-11 ENCOUNTER — Ambulatory Visit (INDEPENDENT_AMBULATORY_CARE_PROVIDER_SITE_OTHER): Payer: Commercial Managed Care - HMO | Admitting: *Deleted

## 2014-04-11 DIAGNOSIS — E538 Deficiency of other specified B group vitamins: Secondary | ICD-10-CM

## 2014-04-11 MED ORDER — CYANOCOBALAMIN 1000 MCG/ML IJ SOLN
1000.0000 ug | Freq: Once | INTRAMUSCULAR | Status: AC
  Administered 2014-04-11: 1000 ug via INTRAMUSCULAR

## 2014-05-16 ENCOUNTER — Ambulatory Visit: Payer: Commercial Managed Care - HMO

## 2014-05-18 ENCOUNTER — Ambulatory Visit (INDEPENDENT_AMBULATORY_CARE_PROVIDER_SITE_OTHER): Payer: PPO | Admitting: *Deleted

## 2014-05-18 DIAGNOSIS — E538 Deficiency of other specified B group vitamins: Secondary | ICD-10-CM

## 2014-05-18 MED ORDER — CYANOCOBALAMIN 1000 MCG/ML IJ SOLN
1000.0000 ug | Freq: Once | INTRAMUSCULAR | Status: AC
  Administered 2014-05-18: 1000 ug via INTRAMUSCULAR

## 2014-05-18 MED ORDER — GLUCOSE BLOOD VI STRP: ORAL_STRIP | Status: DC

## 2014-05-23 ENCOUNTER — Telehealth: Payer: Self-pay | Admitting: *Deleted

## 2014-05-23 ENCOUNTER — Telehealth: Payer: Self-pay

## 2014-05-23 NOTE — Telephone Encounter (Signed)
error 

## 2014-05-23 NOTE — Telephone Encounter (Signed)
PA started on cover my meds for contour test strips. Awaiting response from insurance.

## 2014-05-24 ENCOUNTER — Other Ambulatory Visit: Payer: Self-pay | Admitting: *Deleted

## 2014-05-24 MED ORDER — AMLODIPINE BESYLATE 5 MG PO TABS: 5.0000 mg | ORAL_TABLET | Freq: Every day | ORAL | Status: DC

## 2014-05-24 NOTE — Telephone Encounter (Signed)
Pt needs refill amlodipine, refill sent.  Pt's insurance will no longer cover meter or strips. They will cover One Touch, Precision, or Freestyle. Spoke to someone with her insurance, they advised her to look at meters at pharmacy to see which she likes best. She will do that tomorrow and call us with which brand she would like Rx for.

## 2014-05-26 NOTE — Telephone Encounter (Signed)
PA was denied

## 2014-06-15 ENCOUNTER — Ambulatory Visit (INDEPENDENT_AMBULATORY_CARE_PROVIDER_SITE_OTHER): Payer: PPO | Admitting: *Deleted

## 2014-06-15 DIAGNOSIS — E538 Deficiency of other specified B group vitamins: Secondary | ICD-10-CM

## 2014-06-15 DIAGNOSIS — E1110 Type 2 diabetes mellitus with ketoacidosis without coma: Secondary | ICD-10-CM

## 2014-06-15 MED ORDER — GLUCOSE BLOOD VI STRP: ORAL_STRIP | Status: DC

## 2014-06-15 MED ORDER — CYANOCOBALAMIN 1000 MCG/ML IJ SOLN
1000.0000 ug | Freq: Once | INTRAMUSCULAR | Status: AC
  Administered 2014-06-15: 1000 ug via INTRAMUSCULAR

## 2014-06-15 MED ORDER — ONETOUCH DELICA LANCETS FINE MISC: 1.0000 "application " | Freq: Two times a day (BID) | Status: DC

## 2014-06-20 ENCOUNTER — Ambulatory Visit: Payer: Commercial Managed Care - HMO

## 2014-07-18 ENCOUNTER — Ambulatory Visit (INDEPENDENT_AMBULATORY_CARE_PROVIDER_SITE_OTHER): Payer: PPO | Admitting: *Deleted

## 2014-07-18 DIAGNOSIS — E538 Deficiency of other specified B group vitamins: Secondary | ICD-10-CM

## 2014-07-18 MED ORDER — CYANOCOBALAMIN 1000 MCG/ML IJ SOLN
1000.0000 ug | Freq: Once | INTRAMUSCULAR | Status: AC
  Administered 2014-07-18: 1000 ug via INTRAMUSCULAR

## 2014-08-14 ENCOUNTER — Encounter: Payer: Self-pay | Admitting: Internal Medicine

## 2014-08-14 ENCOUNTER — Ambulatory Visit (INDEPENDENT_AMBULATORY_CARE_PROVIDER_SITE_OTHER): Payer: PPO | Admitting: Internal Medicine

## 2014-08-14 VITALS — BP 136/68 | HR 78 | Temp 97.9°F | Resp 14 | Ht 60.5 in | Wt 111.5 lb

## 2014-08-14 DIAGNOSIS — E119 Type 2 diabetes mellitus without complications: Secondary | ICD-10-CM | POA: Diagnosis not present

## 2014-08-14 DIAGNOSIS — N63 Unspecified lump in breast: Secondary | ICD-10-CM

## 2014-08-14 DIAGNOSIS — N6310 Unspecified lump in the right breast, unspecified quadrant: Secondary | ICD-10-CM

## 2014-08-14 DIAGNOSIS — I10 Essential (primary) hypertension: Secondary | ICD-10-CM | POA: Diagnosis not present

## 2014-08-14 DIAGNOSIS — R000 Tachycardia, unspecified: Secondary | ICD-10-CM | POA: Diagnosis not present

## 2014-08-14 LAB — COMPREHENSIVE METABOLIC PANEL
ALBUMIN: 4.4 g/dL (ref 3.5–5.2)
ALT: 6 U/L (ref 0–35)
AST: 13 U/L (ref 0–37)
Alkaline Phosphatase: 89 U/L (ref 39–117)
BUN: 13 mg/dL (ref 6–23)
CO2: 24 mEq/L (ref 19–32)
CREATININE: 0.52 mg/dL (ref 0.40–1.20)
Calcium: 9.6 mg/dL (ref 8.4–10.5)
Chloride: 99 mEq/L (ref 96–112)
GFR: 121.16 mL/min (ref >60.00)
GLUCOSE: 133 mg/dL — AB (ref 70–99)
Potassium: 3.8 mEq/L (ref 3.5–5.1)
Sodium: 136 mEq/L (ref 135–145)
Total Bilirubin: 0.6 mg/dL (ref 0.2–1.2)
Total Protein: 7.6 g/dL (ref 6.0–8.3)

## 2014-08-14 LAB — LIPID PANEL
CHOLESTEROL: 173 mg/dL (ref 0–200)
HDL: 68.1 mg/dL (ref >39.00)
LDL CALC: 89 mg/dL (ref 0–99)
NonHDL: 104.9
TRIGLYCERIDES: 82 mg/dL (ref 0.0–149.0)
Total CHOL/HDL Ratio: 3
VLDL: 16.4 mg/dL (ref 0.0–40.0)

## 2014-08-14 LAB — MICROALBUMIN / CREATININE URINE RATIO
Creatinine,U: 79.4 mg/dL
Microalb Creat Ratio: 1.1 mg/g (ref 0.0–30.0)
Microalb, Ur: 0.9 mg/dL (ref 0.0–1.9)

## 2014-08-14 LAB — HEMOGLOBIN A1C: HEMOGLOBIN A1C: 6.7 % — AB (ref 4.6–6.5)

## 2014-08-14 MED ORDER — OXYCODONE-ACETAMINOPHEN 5-325 MG PO TABS: 1.0000 | ORAL_TABLET | Freq: Three times a day (TID) | ORAL | Status: DC | PRN

## 2014-08-14 MED ORDER — AMLODIPINE BESYLATE 5 MG PO TABS: 5.0000 mg | ORAL_TABLET | Freq: Every day | ORAL | Status: DC

## 2014-08-14 MED ORDER — METFORMIN HCL 500 MG PO TABS: 500.0000 mg | ORAL_TABLET | Freq: Two times a day (BID) | ORAL | Status: DC

## 2014-08-14 MED ORDER — METOPROLOL TARTRATE 25 MG PO TABS: 25.0000 mg | ORAL_TABLET | Freq: Two times a day (BID) | ORAL | Status: DC

## 2014-08-14 MED ORDER — TETANUS-DIPHTH-ACELL PERTUSSIS 5-2.5-18.5 LF-MCG/0.5 IM SUSP: 0.5000 mL | Freq: Once | INTRAMUSCULAR | Status: DC

## 2014-08-14 NOTE — Patient Instructions (Addendum)
1) You have extremely dry skin which is causing your back  to itch.  You do NOT have scabies   I am prescribing an antibiotic for a few days to clear up any infected areas from scratching so much.  Continue the benadryl for itching   Use Eucerin or Aveeno or your skin ,  Or use their body wash to lubricate all your skin that you can't reach   2) I am VERY CONCERNED that you have Cancer in the right breast that is advanced.  You will need a bilateral mammogram and referral to the [facility].   I am referring  you to Dr Pat Patrick, the surgeon,  And the [facility] to expedite the diagnosis and treatment of your breast mass   3) I have refilled your oxycodone for your next kidney stone attack  4) Your still need your tetanus-diptheria-pertussis vaccine (TDaP) but you can get it for less $$$ at a local pharmacy with the script I have provided you.

## 2014-08-14 NOTE — Progress Notes (Signed)
Patient ID: [name], female   DOB: 06/05/1934, 80 y.o.   MRN: [id number]   Patient Active Problem List   Diagnosis Date Noted  . Mass of right breast 08/15/2014  . Dermatitis 03/30/2014  . Fatigue 02/12/2014  . Anemia 02/12/2014  . Statin intolerance 08/08/2013  . Carotid artery bruit 01/13/2013  . Nephrolithiasis   . 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 Preop cardiovascular exam 04/01/2012  . Hyperlipidemia   . Diabetes mellitus without complication   . Tachycardia 03/15/2012  . Dyspnea 03/15/2012  . Hypertension 03/15/2012    Subjective:  CC:   Chief Complaint  Patient presents with  . Annual Exam    wellness exam. patient taking metoprolol once daily si, reads BID.    HPI:   Dawn Wiley is a 80 y.o. female who presents for  .The patient is here for annual Medicare wellness examination and management of other chronic and acute problems, including Diabetes mellitus Type 2.     Wellness exam has been  postponed due to patient's report over an enlarging breast and a "sore on her breast".  Patient has been declining breast exams and mammograms since 2009, after undergoing 2 benign biopsies.  She now reports that her right breast has been slowly increasing in size and has been irritated and bleeding for the last several weeks.   She doesn't think it's cancer because it doesn't hurt and she has a rash on her back which she believes is affecting her breast.  She lives alone,  Has a grwon daughtter neraby in [place], who she states knows nothing of the breast mass.     Past Medical History  Diagnosis Date  . Kidney stones   . Hyperlipidemia   . Coronary artery disease     Coronary calcifications noted on CT scan  . Diabetes mellitus without complication   . Nephrolithiasis     Past Surgical History  Procedure Laterality Date  . Temporomandibular joint surgery    . Cataract extraction      bilateral       The following portions of the  patient's history were reviewed and updated as appropriate: Allergies, current medications, and problem list.    Review of Systems:   Patient denies headache, fevers, malaise, unintentional weight loss, skin rash, eye pain, sinus congestion and sinus pain, sore throat, dysphagia,  hemoptysis , cough, dyspnea, wheezing, chest pain, palpitations, orthopnea, edema, abdominal pain, nausea, melena, diarrhea, constipation, flank pain, dysuria, hematuria, urinary  Frequency, nocturia, numbness, tingling, seizures,  Focal weakness, Loss of consciousness,  Tremor, insomnia, depression, anxiety, and suicidal ideation.     History   Social History  . Marital Status: Widowed    Spouse Name: N/A  . Number of Children: N/A  . Years of Education: N/A   Occupational History  . Not on file.   Social History Main Topics  . Smoking status: Never Smoker   . Smokeless tobacco: Not on file  . Alcohol Use: No  . Drug Use: No  . Sexual Activity: Not on file   Other Topics Concern  . Not on file   Social History Narrative    Objective:  Filed Vitals:   08/14/14 1042  BP: 136/68  Pulse: 78  Temp: 97.9 F (36.6 C)  Resp: 14     General appearance: alert, cooperative and appears stated age Ears: normal TM's and external ear canals both ears Throat: lips, mucosa, and tongue normal; teeth and gums normal Neck: no adenopathy, no  carotid bruit, supple, symmetrical, trachea midline and thyroid not enlarged, symmetric, no tenderness/mass/nodules Breast: right breast diffusely enlarged to 2x size of left breast secondary to solid mass, with erythema and ulceration noted.  Back: symmetric, no curvature. ROM normal. No CVA tenderness. Lungs: clear to auscultation bilaterally Heart: regular rate and rhythm, S1, S2 normal, no murmur, click, rub or gallop Abdomen: soft, non-tender; bowel sounds normal; no masses,  no organomegaly Pulses: 2+ and symmetric Skin: Skin color, texture, turgor normal. No  rashes or lesions Lymph nodes: Cervical, supraclavicular, and axillary nodes normal.  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  Has deferred mammograms since 2009.  Urgent diagnostic mammogram, surgical consult and referral to Cancer center made today.  Patient is aware of my concern and prefers no to notify her  daughter until the diagnosis is confirmed.     Updated Medication List Outpatient Encounter Prescriptions as of 08/14/2014  Medication Sig  . acetaminophen (TYLENOL) 500 MG tablet Take 500 mg by mouth every 6 (six) hours as needed.  [name] Kitchen amLODipine (NORVASC) 5 MG tablet Take 1 tablet (5 mg total) by mouth daily.  [name] Kitchen aspirin 81 MG tablet Take 1 tablet (81 mg total) by mouth daily.  . ferrous sulfate 325 (65 FE) MG tablet Take 325 mg by mouth daily with breakfast.  . glucose blood test strip Test blood sugars every morning and after meals DX E 11.19  . glucose monitoring kit (FREESTYLE) monitoring kit 1 each by Does not apply route as needed for other. MAY CHOOSE ANY GLUCOMETER COVERED BY HER INSURANCE  . metFORMIN (GLUCOPHAGE) 500 MG tablet Take 1 tablet (500 mg total) by mouth 2 (two) times daily with a meal.  . metoprolol tartrate (LOPRESSOR) 25 MG tablet Take 1 tablet (25 mg total) by mouth 2 (two) times daily.  Glory Rosebush DELICA LANCETS FINE MISC 1 application by Does not apply route 2 (two) times daily.  [name] Kitchen oxyCODONE-acetaminophen (ROXICET) 5-325 MG per tablet Take 1 tablet by mouth every 8 (eight) hours as needed for severe pain.  . [DISCONTINUED] amLODipine (NORVASC) 5 MG tablet Take 1 tablet (5 mg total) by mouth daily.  . [DISCONTINUED] metFORMIN (GLUCOPHAGE) 500 MG tablet Take 1 tablet (500 mg total) by mouth 2 (two) times daily with a meal.  . [DISCONTINUED] metoprolol tartrate (LOPRESSOR) 25 MG tablet Take 1 tablet (25 mg total) by mouth 2 (two) times daily.  . [DISCONTINUED]  oxyCODONE-acetaminophen (ROXICET) 5-325 MG per tablet Take 1 tablet by mouth every 8 (eight) hours as needed for severe pain.  . Tdap (BOOSTRIX) 5-2.5-18.5 LF-MCG/0.5 injection Inject 0.5 mLs into the muscle once.  . triamcinolone cream (KENALOG) 0.1 % Apply 1 application topically 2 (two) times daily. (Patient not taking: Reported on 08/14/2014)     Orders Placed This Encounter  Procedures  . MM Digital Diagnostic Bilat  . Comprehensive metabolic panel  . Hemoglobin A1c  . Lipid panel  . Microalbumin / creatinine urine ratio  . Ambulatory referral to Oncology  . Ambulatory referral to General Surgery    No Follow-up on file.

## 2014-08-14 NOTE — Progress Notes (Signed)
Pre-visit discussion using our clinic review tool. No additional management support is needed unless otherwise documented below in the visit note.  

## 2014-08-15 ENCOUNTER — Telehealth: Payer: Self-pay | Admitting: Internal Medicine

## 2014-08-15 ENCOUNTER — Other Ambulatory Visit: Payer: Self-pay | Admitting: *Deleted

## 2014-08-15 ENCOUNTER — Ambulatory Visit
Admit: 2014-08-15 | Disposition: A | Discharge status: Home / Self Care | Payer: Self-pay | Attending: Internal Medicine | Admitting: Internal Medicine

## 2014-08-15 DIAGNOSIS — N6310 Unspecified lump in the right breast, unspecified quadrant: Secondary | ICD-10-CM

## 2014-08-15 MED ORDER — HYDROCORTISONE VALERATE 0.2 % EX OINT: 1.0000 "application " | TOPICAL_OINTMENT | Freq: Two times a day (BID) | CUTANEOUS | Status: DC

## 2014-08-15 NOTE — Telephone Encounter (Signed)
i sent another steroid ointment bu she needs to start using a quality mositurizer as discussed ,  Eucerin skin cream for extremely dry skin

## 2014-08-15 NOTE — Assessment & Plan Note (Signed)
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  Has deferred mammograms since 2009.  Urgent diagnostic mammogram, surgical consult and referral to Cancer center made today.  Patient is aware of my concern and prefers no to notify her  daughter until the diagnosis is confirmed.

## 2014-08-15 NOTE — Telephone Encounter (Signed)
See previous phone note.  

## 2014-08-15 NOTE — Op Note (Signed)
PATIENT NAME:  Dawn Wiley, Dawn Wiley MR#:  [id number] DATE OF BIRTH:  [birth year]  DATE OF PROCEDURE:  04/27/2012  PRINCIPAL DIAGNOSIS: Right ureterolithiasis.   POSTOPERATIVE DIAGNOSIS: Right ureterolithiasis.   PROCEDURE: Right ureteroscopy with holmium laser lithotripsy.   SURGEON: Dr. Edrick Oh.  ANESTHESIA: Laryngeal mask airway anesthesia.   INDICATIONS: The patient is a 80 year old white female who initially presented with a right proximal ureteral calculus. She underwent subsequent ESWL. There was minimal fragmentation. She has a persistent 6 mm stone at the right UVJ with failure of progression. She presents for ureteroscopic stone removal.   DESCRIPTION OF PROCEDURE: After informed consent was obtained, the patient was taken to the operating room and placed in the dorsal lithotomy position under laryngeal mask airway anesthesia. The patient was then prepped and draped in the usual standard fashion. The 22-French rigid cystoscope was introduced into the urethra under direct vision with no urethral abnormalities noted. Upon entering the bladder, the mucosa was inspected in its entirety with no gross mucosal lesions noted. Bilateral ureteral orifices were well visualized with no lesions noted. A prominent bulge was noted just behind the right ureteral orifice consistent with the stone. A guidewire was advanced into the right ureteral orifice. It was easily advanced into the upper pole collecting system under fluoroscopic guidance without difficulty. The cystoscope was then removed. The 6-French rigid ureteroscope was advanced into the urinary bladder. It was advanced into the right ureteral orifice. The stone was visible in a dilated portion of the distal ureter. The stone was then fragmented into multiple smaller pieces utilizing the holmium laser fiber. The pieces were then basket extracted. The scope was advanced to the level of the ureteropelvic junction and into the upper pole collecting system.  Some Randall's plaques were noted in the upper pole calyces. No other abnormalities were appreciated. The ureter was re-examined upon withdrawal of the scope. Minimal edema was present. The decision was made not to place a stent. The ureteroscope was removed. The cystoscope was replaced back into the urinary bladder. The stone fragments were irrigated free. These were collected and will be sent for stone analysis. The bladder was then drained. The cystoscope was removed. The patient was returned to the supine position and awakened from laryngeal mask airway anesthesia. She was taken to the recovery room in stable condition. There were no problems or complications. The patient tolerated the procedure well.     ____________________________ Denice Bors. Jacqlyn Larsen, MD bsc:dm D: 04/27/2012 13:13:19 ET T: 04/27/2012 13:21:57 ET JOB#: [id number]  cc: Denice Bors. Jacqlyn Larsen, MD, <Dictator> Denice Bors Monesha Monreal MD ELECTRONICALLY SIGNED 04/27/2012 14:06

## 2014-08-15 NOTE — Telephone Encounter (Signed)
Needs antibiotic for her rash on her back that she has scratched.

## 2014-08-15 NOTE — Telephone Encounter (Signed)
Patient left voicemail and ask about medication for rash she has and if medication can be called to [facility] patient stated was in place of Kenalog cream. Discussed during visit on 08/11/14

## 2014-08-15 NOTE — Telephone Encounter (Signed)
Patient notified and voiced understanding.

## 2014-08-16 ENCOUNTER — Encounter: Payer: Self-pay | Admitting: *Deleted

## 2014-08-24 ENCOUNTER — Ambulatory Visit
Admit: 2014-08-24 | Disposition: A | Discharge status: Home / Self Care | Payer: Self-pay | Attending: Surgery | Admitting: Surgery

## 2014-08-24 ENCOUNTER — Ambulatory Visit: Payer: PPO

## 2014-08-24 LAB — CBC WITH DIFFERENTIAL/PLATELET
BASOS ABS: 0.1 10*3/uL (ref 0.0–0.1)
Basophil %: 0.8 %
Eosinophil #: 0.1 10*3/uL (ref 0.0–0.7)
Eosinophil %: 1.2 %
HCT: 25.5 % — ABNORMAL LOW (ref 35.0–47.0)
HGB: 7.3 g/dL — ABNORMAL LOW (ref 12.0–16.0)
LYMPHS PCT: 11.3 %
Lymphocyte #: 1.1 10*3/uL (ref 1.0–3.6)
MCH: 19.8 pg — ABNORMAL LOW (ref 26.0–34.0)
MCHC: 28.6 g/dL — ABNORMAL LOW (ref 32.0–36.0)
MCV: 69 fL — ABNORMAL LOW (ref 80–100)
MONOS PCT: 9 %
Monocyte #: 0.9 x10 3/mm (ref 0.2–0.9)
NEUTROS PCT: 77.7 %
Neutrophil #: 7.7 10*3/uL — ABNORMAL HIGH (ref 1.4–6.5)
Platelet: 346 10*3/uL (ref 150–440)
RBC: 3.67 10*6/uL — ABNORMAL LOW (ref 3.80–5.20)
RDW: 19.4 % — AB (ref 11.5–14.5)
WBC: 9.9 10*3/uL (ref 3.6–11.0)

## 2014-08-24 LAB — BASIC METABOLIC PANEL
ANION GAP: 8 (ref 7–16)
BUN: 15 mg/dL
CALCIUM: 9.1 mg/dL
CO2: 23 mmol/L
CREATININE: 0.5 mg/dL
Chloride: 107 mmol/L
EGFR (African American): >60
EGFR (Non-African Amer.): >60
Glucose: 147 mg/dL — ABNORMAL HIGH
Potassium: 3.7 mmol/L
SODIUM: 138 mmol/L

## 2014-08-24 LAB — HEPATIC FUNCTION PANEL A (ARMC)
Albumin: 3.7 g/dL
Alkaline Phosphatase: 69 U/L
Bilirubin, Direct: <0.1 mg/dL
Bilirubin,Total: 0.4 mg/dL
SGOT(AST): 20 U/L
SGPT (ALT): 9 U/L — ABNORMAL LOW
TOTAL PROTEIN: 7 g/dL

## 2014-08-24 LAB — PROTIME-INR
INR: 0.9
Prothrombin Time: 12.8 secs

## 2014-08-31 ENCOUNTER — Encounter: Payer: Self-pay | Admitting: Hematology and Oncology

## 2014-08-31 ENCOUNTER — Encounter
Admission: RE | Disposition: A | Discharge status: Home / Self Care | Payer: Self-pay | Source: Ambulatory Visit | Attending: Surgery

## 2014-08-31 ENCOUNTER — Other Ambulatory Visit: Payer: Self-pay | Admitting: Surgery

## 2014-08-31 ENCOUNTER — Ambulatory Visit: Payer: PPO | Admitting: Certified Registered Nurse Anesthetist

## 2014-08-31 ENCOUNTER — Encounter: Payer: Self-pay | Admitting: *Deleted

## 2014-08-31 ENCOUNTER — Observation Stay
Admission: RE | Admit: 2014-08-31 | Discharge: 2014-09-02 | Disposition: A | Discharge status: Home / Self Care | Payer: PPO | Source: Ambulatory Visit | Attending: Surgery | Admitting: Surgery

## 2014-08-31 DIAGNOSIS — R011 Cardiac murmur, unspecified: Secondary | ICD-10-CM | POA: Insufficient documentation

## 2014-08-31 DIAGNOSIS — Z9849 Cataract extraction status, unspecified eye: Secondary | ICD-10-CM | POA: Diagnosis not present

## 2014-08-31 DIAGNOSIS — E119 Type 2 diabetes mellitus without complications: Secondary | ICD-10-CM | POA: Diagnosis not present

## 2014-08-31 DIAGNOSIS — Z87442 Personal history of urinary calculi: Secondary | ICD-10-CM | POA: Insufficient documentation

## 2014-08-31 DIAGNOSIS — Z833 Family history of diabetes mellitus: Secondary | ICD-10-CM | POA: Diagnosis not present

## 2014-08-31 DIAGNOSIS — C50919 Malignant neoplasm of unspecified site of unspecified female breast: Secondary | ICD-10-CM | POA: Diagnosis present

## 2014-08-31 DIAGNOSIS — I10 Essential (primary) hypertension: Secondary | ICD-10-CM | POA: Diagnosis not present

## 2014-08-31 DIAGNOSIS — C50911 Malignant neoplasm of unspecified site of right female breast: Principal | ICD-10-CM | POA: Insufficient documentation

## 2014-08-31 DIAGNOSIS — E785 Hyperlipidemia, unspecified: Secondary | ICD-10-CM | POA: Insufficient documentation

## 2014-08-31 DIAGNOSIS — Z79899 Other long term (current) drug therapy: Secondary | ICD-10-CM | POA: Insufficient documentation

## 2014-08-31 DIAGNOSIS — M810 Age-related osteoporosis without current pathological fracture: Secondary | ICD-10-CM | POA: Diagnosis not present

## 2014-08-31 DIAGNOSIS — R000 Tachycardia, unspecified: Secondary | ICD-10-CM | POA: Insufficient documentation

## 2014-08-31 DIAGNOSIS — R0600 Dyspnea, unspecified: Secondary | ICD-10-CM | POA: Insufficient documentation

## 2014-08-31 DIAGNOSIS — C773 Secondary and unspecified malignant neoplasm of axilla and upper limb lymph nodes: Secondary | ICD-10-CM | POA: Diagnosis not present

## 2014-08-31 HISTORY — PX: MASTECTOMY MODIFIED RADICAL: SHX5962

## 2014-08-31 LAB — PREPARE RBC (CROSSMATCH)

## 2014-08-31 LAB — GLUCOSE, CAPILLARY: GLUCOSE-CAPILLARY: 118 mg/dL — AB (ref 70–99)

## 2014-08-31 SURGERY — MASTECTOMY, MODIFIED RADICAL
Anesthesia: General | Laterality: Right

## 2014-08-31 MED ORDER — KETOROLAC TROMETHAMINE 15 MG/ML IJ SOLN
15.0000 mg | Freq: Four times a day (QID) | INTRAMUSCULAR | Status: DC
  Administered 2014-08-31 – 2014-09-02 (×7): 15 mg via INTRAVENOUS
  Filled 2014-08-31 (×7): qty 1

## 2014-08-31 MED ORDER — METFORMIN HCL 500 MG PO TABS
500.0000 mg | ORAL_TABLET | Freq: Two times a day (BID) | ORAL | Status: DC
  Administered 2014-08-31 – 2014-09-02 (×4): 500 mg via ORAL
  Filled 2014-08-31 (×4): qty 1

## 2014-08-31 MED ORDER — HYDROCORTISONE VALERATE 0.2 % EX OINT
1.0000 "application " | TOPICAL_OINTMENT | Freq: Two times a day (BID) | CUTANEOUS | Status: DC
  Administered 2014-09-02: 1 via TOPICAL
  Filled 2014-08-31: qty 15

## 2014-08-31 MED ORDER — MIDAZOLAM HCL 2 MG/2ML IJ SOLN
INTRAMUSCULAR | Status: DC | PRN
  Administered 2014-08-31: 2 mg via INTRAVENOUS

## 2014-08-31 MED ORDER — FERROUS SULFATE 325 (65 FE) MG PO TABS
325.0000 mg | ORAL_TABLET | Freq: Every day | ORAL | Status: DC
  Administered 2014-09-01 – 2014-09-02 (×2): 325 mg via ORAL
  Filled 2014-08-31 (×2): qty 1

## 2014-08-31 MED ORDER — OXYCODONE-ACETAMINOPHEN 5-325 MG PO TABS: 1.0000 | ORAL_TABLET | Freq: Three times a day (TID) | ORAL | Status: DC | PRN

## 2014-08-31 MED ORDER — SODIUM CHLORIDE 0.9 % IV SOLN
INTRAVENOUS | Status: DC
  Administered 2014-08-31: 12:00:00 via INTRAVENOUS

## 2014-08-31 MED ORDER — FAMOTIDINE 20 MG PO TABS
20.0000 mg | ORAL_TABLET | Freq: Once | ORAL | Status: AC
  Administered 2014-08-31: 20 mg via ORAL

## 2014-08-31 MED ORDER — ONDANSETRON HCL 4 MG/2ML IJ SOLN
4.0000 mg | Freq: Four times a day (QID) | INTRAMUSCULAR | Status: DC | PRN
  Administered 2014-08-31: 4 mg via INTRAVENOUS
  Filled 2014-08-31: qty 2

## 2014-08-31 MED ORDER — OXYCODONE-ACETAMINOPHEN 5-325 MG PO TABS: 1.0000 | ORAL_TABLET | ORAL | Status: DC | PRN

## 2014-08-31 MED ORDER — ACETAMINOPHEN 500 MG PO TABS: 500.0000 mg | ORAL_TABLET | Freq: Four times a day (QID) | ORAL | Status: DC | PRN

## 2014-08-31 MED ORDER — ONDANSETRON HCL 4 MG PO TABS: 4.0000 mg | ORAL_TABLET | Freq: Four times a day (QID) | ORAL | Status: DC | PRN

## 2014-08-31 MED ORDER — ACETAMINOPHEN 10 MG/ML IV SOLN
INTRAVENOUS | Status: DC | PRN
  Administered 2014-08-31: 1000 mg via INTRAVENOUS

## 2014-08-31 MED ORDER — POTASSIUM CHLORIDE IN NACL 20-0.9 MEQ/L-% IV SOLN
INTRAVENOUS | Status: DC
  Administered 2014-08-31: 1 mL via INTRAVENOUS
  Administered 2014-09-01 – 2014-09-02 (×2): via INTRAVENOUS
  Filled 2014-08-31 (×6): qty 1000

## 2014-08-31 MED ORDER — CEFAZOLIN SODIUM-DEXTROSE 2-3 GM-% IV SOLR
2.0000 g | Freq: Once | INTRAVENOUS | Status: AC
  Administered 2014-08-31: 2 g via INTRAVENOUS

## 2014-08-31 MED ORDER — FENTANYL CITRATE (PF) 100 MCG/2ML IJ SOLN
INTRAMUSCULAR | Status: DC | PRN
  Administered 2014-08-31 (×3): 50 ug via INTRAVENOUS

## 2014-08-31 MED ORDER — PROPOFOL 10 MG/ML IV BOLUS
INTRAVENOUS | Status: DC | PRN
Start: 2014-08-31 — End: 2014-08-31
  Administered 2014-08-31: 50 mg via INTRAVENOUS
  Administered 2014-08-31: 100 mg via INTRAVENOUS

## 2014-08-31 MED ORDER — MORPHINE SULFATE 2 MG/ML IJ SOLN: 2.0000 mg | INTRAMUSCULAR | Status: DC | PRN

## 2014-08-31 MED ORDER — AMLODIPINE BESYLATE 5 MG PO TABS
5.0000 mg | ORAL_TABLET | Freq: Every day | ORAL | Status: DC
  Administered 2014-08-31 – 2014-09-01 (×2): 5 mg via ORAL
  Filled 2014-08-31 (×2): qty 1

## 2014-08-31 MED ORDER — METOPROLOL TARTRATE 25 MG PO TABS
25.0000 mg | ORAL_TABLET | Freq: Two times a day (BID) | ORAL | Status: DC
  Administered 2014-08-31 – 2014-09-01 (×3): 25 mg via ORAL
  Filled 2014-08-31 (×3): qty 1

## 2014-08-31 MED ORDER — HYDROMORPHONE HCL 1 MG/ML IJ SOLN: 0.2500 mg | INTRAMUSCULAR | Status: DC | PRN

## 2014-08-31 MED ORDER — HYDROMORPHONE HCL 1 MG/ML IJ SOLN
INTRAMUSCULAR | Status: DC | PRN
  Administered 2014-08-31: 0.5 mg via INTRAVENOUS

## 2014-08-31 MED ORDER — ONDANSETRON HCL 4 MG/2ML IJ SOLN
INTRAMUSCULAR | Status: DC | PRN
  Administered 2014-08-31: 4 mg via INTRAVENOUS

## 2014-08-31 MED ORDER — ONDANSETRON HCL 4 MG/2ML IJ SOLN: 4.0000 mg | Freq: Once | INTRAMUSCULAR | Status: DC | PRN

## 2014-08-31 MED ORDER — LIDOCAINE HCL (CARDIAC) 20 MG/ML IV SOLN
INTRAVENOUS | Status: DC | PRN
  Administered 2014-08-31: 100 mg via INTRAVENOUS

## 2014-08-31 MED ORDER — CEFAZOLIN SODIUM 1-5 GM-% IV SOLN
1.0000 g | Freq: Four times a day (QID) | INTRAVENOUS | Status: AC
  Administered 2014-08-31 – 2014-09-01 (×3): 1 g via INTRAVENOUS
  Filled 2014-08-31 (×3): qty 50

## 2014-08-31 MED ORDER — ACETAMINOPHEN 10 MG/ML IV SOLN
INTRAVENOUS | Status: AC
  Filled 2014-08-31: qty 100

## 2014-08-31 MED ORDER — CEFAZOLIN SODIUM-DEXTROSE 2-3 GM-% IV SOLR
INTRAVENOUS | Status: AC
  Administered 2014-08-31: 2 g via INTRAVENOUS
  Filled 2014-08-31: qty 50

## 2014-08-31 SURGICAL SUPPLY — 42 items
APPLIER CLIP 11 MED OPEN (CLIP)
BLADE SURG 15 STRL LF DISP TIS (BLADE) ×1 IMPLANT
BLADE SURG 15 STRL SS (BLADE) ×2
BNDG GAUZE 4.5X4.1 6PLY STRL (MISCELLANEOUS) ×3 IMPLANT
BULB RESERV EVAC DRAIN JP 100C (MISCELLANEOUS) ×6 IMPLANT
CANISTER SUCT 1200ML W/VALVE (MISCELLANEOUS) ×3 IMPLANT
CHLORAPREP W/TINT 26ML (MISCELLANEOUS) ×3 IMPLANT
CLIP APPLIE 11 MED OPEN (CLIP) IMPLANT
DRAIN JACKSON PRT FLT 10 (DRAIN) ×6 IMPLANT
DRAPE CHEST BREAST 77X106 FENE (MISCELLANEOUS) ×3 IMPLANT
DRAPE SHEET LG 3/4 BI-LAMINATE (DRAPES) ×3 IMPLANT
ELECT CAUTERY BLADE 6.4 (BLADE) ×3 IMPLANT
ELECT EZSTD 165MM 6.5IN (MISCELLANEOUS)
ELECTRODE EZSTD 165MM 6.5IN (MISCELLANEOUS) IMPLANT
GAUZE FLUFF 18X24 1PLY STRL (GAUZE/BANDAGES/DRESSINGS) ×3 IMPLANT
GLOVE BIO SURGEON STRL SZ7.5 (GLOVE) ×15 IMPLANT
GOWN STRL REUS W/ TWL LRG LVL3 (GOWN DISPOSABLE) ×3 IMPLANT
GOWN STRL REUS W/TWL LRG LVL3 (GOWN DISPOSABLE) ×6
HARMONIC SCALPEL FOCUS (MISCELLANEOUS) ×3 IMPLANT
JACKSON PRATT 10 (INSTRUMENTS) IMPLANT
KIT RM TURNOVER STRD PROC AR (KITS) ×3 IMPLANT
LABEL OR SOLS (LABEL) IMPLANT
NDL SAFETY 22GX1.5 (NEEDLE) IMPLANT
PACK BASIN MINOR ARMC (MISCELLANEOUS) ×3 IMPLANT
PAD GROUND ADULT SPLIT (MISCELLANEOUS) ×3 IMPLANT
SPONGE LAP 18X18 5 PK (GAUZE/BANDAGES/DRESSINGS) ×3 IMPLANT
STAPLER SKIN PROX 35W (STAPLE) IMPLANT
SURGI-BRA LG (MISCELLANEOUS) IMPLANT
SUT ETHILON 2 0 FS 18 (SUTURE) ×6 IMPLANT
SUT MNCRL 3-0 UNDYED SH (SUTURE) ×3 IMPLANT
SUT MNCRL 4-0 (SUTURE) ×2
SUT MNCRL 4-0 27XMFL (SUTURE) ×1
SUT MONOCRYL 3-0 UNDYED (SUTURE) ×6
SUT SILK 2 0 SH CR/8 (SUTURE) ×3 IMPLANT
SUT SILK 3 0 (SUTURE) ×2
SUT SILK 3 0 SH 30 (SUTURE) IMPLANT
SUT SILK 3-0 18XBRD TIE 12 (SUTURE) ×1 IMPLANT
SUT STRIP PLUS 1X5 (SUTURE) ×3 IMPLANT
SUTURE MNCRL 4-0 27XMF (SUTURE) ×1 IMPLANT
SYR BULB EAR ULCER 3OZ GRN STR (SYRINGE) IMPLANT
SYRINGE 10CC LL (SYRINGE) IMPLANT
WATER STERILE IRR 1000ML POUR (IV SOLUTION) ×3 IMPLANT

## 2014-08-31 NOTE — Anesthesia Preprocedure Evaluation (Addendum)
Anesthesia Evaluation  Patient identified by MRN, date of birth, ID band Patient awake    Reviewed: Allergy & Precautions, NPO status , Patient's Chart, lab work & pertinent test results, reviewed documented beta blocker date and time   History of Anesthesia Complications (+) PONV and history of anesthetic complications  Airway Mallampati: III  TM Distance: <3 FB Neck ROM: Full  Mouth opening: Limited Mouth Opening  Dental  (+) Partial Lower   Pulmonary neg pulmonary ROS, PE   Pulmonary exam normal       Cardiovascular hypertension, Pt. on medications and Pt. on home beta blockers + CAD Normal cardiovascular exam    Neuro/Psych negative neurological ROS  negative psych ROS   GI/Hepatic negative GI ROS, Neg liver ROS,   Endo/Other  diabetes, Well Controlled, Type 2  Renal/GU Renal disease     Musculoskeletal  (+) Arthritis -, Osteoarthritis,    Abdominal   Peds  Hematology  (+) anemia ,   Anesthesia Other Findings Pt with h/o TMJ surgery and poor oral opening.  Reproductive/Obstetrics                           Anesthesia Physical Anesthesia Plan  ASA: III  Anesthesia Plan: General   Post-op Pain Management:    Induction: Intravenous  Airway Management Planned: LMA  Additional Equipment:   Intra-op Plan:   Post-operative Plan: Extubation in OR  Informed Consent: I have reviewed the patients History and Physical, chart, labs and discussed the procedure including the risks, benefits and alternatives for the proposed anesthesia with the patient or authorized representative who has indicated his/her understanding and acceptance.     Plan Discussed with: CRNA and Surgeon  Anesthesia Plan Comments:         Anesthesia Quick Evaluation

## 2014-08-31 NOTE — Progress Notes (Signed)
Report given to Jeanice Lim, RN

## 2014-08-31 NOTE — Progress Notes (Signed)
H&P unchanged. Paper copy to be scanned into the system. The risks of surgery were again reviewed with the patient and family and they agree to proceed.

## 2014-08-31 NOTE — Transfer of Care (Signed)
Immediate Anesthesia Transfer of Care Note  Patient: Dawn Wiley  Procedure(s) Performed: Procedure(s): MASTECTOMY MODIFIED RADICAL (Right)  Patient Location: PACU  Anesthesia Type:General  Level of Consciousness: awake and patient cooperative  Airway & Oxygen Therapy: Patient Spontanous Breathing and Patient connected to nasal cannula oxygen  Post-op Assessment: Report given to RN and Post -op Vital signs reviewed and stable  Post vital signs: unstable  Last Vitals:  Filed Vitals:   08/31/14 1204  BP: 141/64  Pulse: 80  Temp: 36.8 C  Resp: 16    Complications: No apparent anesthesia complications

## 2014-08-31 NOTE — Op Note (Signed)
Operative Note  Preoperative Diagnosis: Right breast cancer   Postoperative Diagnosis: Same  Operation Performed: Right modified radical mastectomy   Date of Procedure: 08/31/2014   Surgeon: Laverle Patter., M.D.  Assistant: None  Anesthesia: General Endotracheal  Procedure in Detail: The patient was prepped and draped in usual sterile fashion after being placed supine on the operating room table. An elliptical incision was made oriented from side to side to include the majority of the skin of the breast as the tumor was very closed to the skin in multiple areas. The incision was carried down through the skin and subcutaneous tissue but left above the breast tissue and subcutaneous skin flaps were created in all 4 directions. The medial border was the lateral edge of the sternum the superior border was the clavicle the inferior border was inframammary fold and the lateral border was the free edge of the latissimus dorsi muscle. The breast was removed from the chest wall along with the pectoralis major muscle fascia and perforating vessels from the inferior mammary artery were ligated with 2-0 silk ligatures. The rest of the hemostasis was achieved with a combination of electrocautery and the Harmonic scalpel. The level I and level II lymph nodes were excised en bloc with the breast, including the interpectoral fat. Care was taken to avoid injury to the medial pectoral nerve, the long thoracic nerve, and the thoracodorsal nerve. There were a number of positive lymph nodes that went all the way to the upper lymph node at the top of level II. The superior border of the axillary lymph node dissection was the axillary vein and dissection here was carried out from lateral to medial underneath the pectoralis major muscle and pectoralis minor muscle. Large vessels were ligated with 2-0 silk and smaller vessels were handled with the Harmonic scalpel. The breast and axillary contents were passed off the  table and the wound was irrigated with warm normal saline which was suctioned clear. Hemostasis was excellent. Two15 Blake drains were placed beneath the inframammary fold with the lateral drain being in the axilla and the medial drain being over the pectoralis major muscle. Each of these was secured to the skin with a 3-0 nylon suture and cut to an appropriate length. An interrupted subdermal closure of 3-0 Monocryl was performed and the skin was reapproximated with a running subcuticular 4-0 Monocryl and suture strips. A compressive dressing of fluffs and Kerlix and Ace wrap completed the procedure and the Jackson-Pratt bulbs were applied to the [place] drain tubing.  Estimated Blood Loss: 100 ml         Specimen(s): Right breast, level I and level II axillary lymph nodes            Complications: None; the patient tolerated the procedure well.

## 2014-08-31 NOTE — Anesthesia Procedure Notes (Addendum)
Procedure Name: LMA Insertion Performed by: Tynetta Bachmann Pre-anesthesia Checklist: Patient identified, Emergency Drugs available, Suction available, Patient being monitored and Timeout performed Patient Re-evaluated:Patient Re-evaluated prior to inductionOxygen Delivery Method: Circle system utilized Preoxygenation: Pre-oxygenation with 100% oxygen Intubation Type: IV induction Ventilation: Mask ventilation without difficulty LMA: LMA inserted LMA Size: 3.0 Number of attempts: 1 Tube secured with: Tape Dental Injury: Teeth and Oropharynx as per pre-operative assessment

## 2014-09-01 DIAGNOSIS — C50911 Malignant neoplasm of unspecified site of right female breast: Secondary | ICD-10-CM | POA: Diagnosis not present

## 2014-09-01 LAB — COMPREHENSIVE METABOLIC PANEL
ALT: 8 U/L — AB (ref 14–54)
AST: 17 U/L (ref 15–41)
Albumin: 3.5 g/dL (ref 3.5–5.0)
Alkaline Phosphatase: 69 U/L (ref 38–126)
Anion gap: 11 (ref 5–15)
BUN: 12 mg/dL (ref 6–20)
CALCIUM: 8.7 mg/dL — AB (ref 8.9–10.3)
CO2: 20 mmol/L — ABNORMAL LOW (ref 22–32)
Chloride: 107 mmol/L (ref 101–111)
Creatinine, Ser: 0.51 mg/dL (ref 0.44–1.00)
GFR calc Af Amer: >60 mL/min (ref >60)
GFR calc non Af Amer: >60 mL/min (ref >60)
Glucose, Bld: 128 mg/dL — ABNORMAL HIGH (ref 65–99)
Potassium: 4.1 mmol/L (ref 3.5–5.1)
SODIUM: 138 mmol/L (ref 135–145)
TOTAL PROTEIN: 6.5 g/dL (ref 6.5–8.1)
Total Bilirubin: 0.7 mg/dL (ref 0.3–1.2)

## 2014-09-01 LAB — CBC
HCT: 23.8 % — ABNORMAL LOW (ref 35.0–47.0)
Hemoglobin: 6.9 g/dL — ABNORMAL LOW (ref 12.0–16.0)
MCH: 20.4 pg — ABNORMAL LOW (ref 26.0–34.0)
MCHC: 29.2 g/dL — ABNORMAL LOW (ref 32.0–36.0)
MCV: 69.9 fL — ABNORMAL LOW (ref 80.0–100.0)
Platelets: 347 10*3/uL (ref 150–440)
RBC: 3.4 MIL/uL — AB (ref 3.80–5.20)
RDW: 19.1 % — AB (ref 11.5–14.5)
WBC: 8.2 10*3/uL (ref 3.6–11.0)

## 2014-09-01 LAB — MAGNESIUM: MAGNESIUM: 1.9 mg/dL (ref 1.7–2.4)

## 2014-09-01 LAB — PHOSPHORUS: Phosphorus: 3.9 mg/dL (ref 2.5–4.6)

## 2014-09-01 NOTE — Progress Notes (Signed)
1 Day Post-Op   Subjective: Slept off and on; well rested No pain  Vital signs in last 24 hours: Temp:  [97.1 F (36.2 C)-98.2 F (36.8 C)] 98.2 F (36.8 C) (05/06 0735) Pulse Rate:  [56-94] 82 (05/06 0735) Resp:  [10-21] 18 (05/06 0735) BP: (102-141)/(49-67) 106/67 mmHg (05/06 0735) SpO2:  [92 %-100 %] 94 % (05/06 0735) Weight:  [52.164 kg (115 lb)-52.935 kg (116 lb 11.2 oz)] 52.935 kg (116 lb 11.2 oz) (05/06 0057) Last BM Date: 08/30/14  Intake/Output from previous day: 05/05 0701 - 05/06 0700 In: 1200 [I.V.:1100] Out: 805 [Urine:700; Drains:80; Blood:25]   Lab Results:  CBC  Recent Labs  09/01/14 0421  WBC 8.2  HGB 6.9*  HCT 23.8*  PLT 347   CMP     Component Value Date/Time   NA 138 09/01/2014 0421   NA 138 08/24/2014 1101   NA 140 03/15/2012 1556   K 4.1 09/01/2014 0421   K 3.7 08/24/2014 1101   CL 107 09/01/2014 0421   CL 107 08/24/2014 1101   CO2 20* 09/01/2014 0421   CO2 23 08/24/2014 1101   GLUCOSE 128* 09/01/2014 0421   GLUCOSE 147* 08/24/2014 1101   GLUCOSE 265* 03/15/2012 1556   BUN 12 09/01/2014 0421   BUN 15 08/24/2014 1101   BUN 8 03/15/2012 1556   CREATININE 0.51 09/01/2014 0421   CREATININE 0.50 08/24/2014 1101   CALCIUM 8.7* 09/01/2014 0421   CALCIUM 9.1 08/24/2014 1101   PROT 6.5 09/01/2014 0421   PROT 7.0 08/24/2014 1101   PROT 6.8 05/18/2012 0902   ALBUMIN 3.5 09/01/2014 0421   ALBUMIN 3.7 08/24/2014 1101   AST 17 09/01/2014 0421   AST 20 08/24/2014 1101   ALT 8* 09/01/2014 0421   ALT 9* 08/24/2014 1101   ALKPHOS 69 09/01/2014 0421   ALKPHOS 69 08/24/2014 1101   BILITOT 0.7 09/01/2014 0421   GFRNONAA >60 09/01/2014 0421   GFRNONAA >60 08/24/2014 1101   GFRAA >60 09/01/2014 0421   GFRAA >60 08/24/2014 1101   PT/INR No results for input(s): LABPROT, INR in the last 72 hours.  Studies/Results: No results found.  Assessment/Plan: All questions (re: likely stage - 3 or 4 - and need for chemoTx and possibly XRT)  answered D/C tomorrow after a day of drain education

## 2014-09-01 NOTE — Progress Notes (Signed)
Hemoglobin 6.9. Doctor notified and acknowledged at [phone].

## 2014-09-01 NOTE — Anesthesia Postprocedure Evaluation (Signed)
  Anesthesia Post-op Note  Patient: Dawn Wiley  Procedure(s) Performed: Procedure(s): MASTECTOMY MODIFIED RADICAL (Right)  Anesthesia type:General  Patient location: 208A  Post pain: Pain level controlled  Post assessment: Post-op Vital signs reviewed, Patient's Cardiovascular Status Stable, Respiratory Function Stable, Patent Airway and mild nausea immediately postop:  Now resolved  Post vital signs: Reviewed and stable  Last Vitals:  Filed Vitals:   09/01/14 0735  BP: 106/67  Pulse: 82  Temp: 36.8 C  Resp: 18    Level of consciousness: awake, alert  and patient cooperative  Complications: No apparent anesthesia complications

## 2014-09-02 DIAGNOSIS — C50911 Malignant neoplasm of unspecified site of right female breast: Secondary | ICD-10-CM | POA: Diagnosis not present

## 2014-09-02 MED ORDER — IBUPROFEN 400 MG PO TABS: 400.0000 mg | ORAL_TABLET | ORAL | Status: DC | PRN

## 2014-09-02 NOTE — Progress Notes (Signed)
Pt discharged. Instructions and education provided to pt and family. IV removed. Pt and family escorted out by staff.

## 2014-09-02 NOTE — Discharge Summary (Signed)
Patient ID: Dawn Wiley MRN: [id number] DOB/AGE: May 26, 1934 80 y.o.  Admit date: 08/31/2014 Discharge date: 09/02/2014  Discharge Diagnoses:  Right breast cancer  Procedures Performed: Right modified radical mastectomy  Discharged Condition: good  Hospital Course: The patient underwent the above-mentioned procedure for the above-mentioned diagnosis and she recovered nicely. She was taking care of her drains and not requiring any pain medication other than Toradol.  Discharge Orders: I advised the patient and her daughter to obtain a snug fitting sports bra and use the gauze to generate some compression to wean her skin and her chest wall. She knows to record the output of her drains separately and bring the record with her to clinic.   Disposition: Final discharge disposition not confirmed  Discharge Medications:  Current facility-administered medications:  .  0.9 % NaCl with KCl 20 mEq/ L  infusion, , Intravenous, Continuous, Molly Maduro, MD, Last Rate: 75 mL/hr at 09/02/14 0110 .  acetaminophen (TYLENOL) tablet 500 mg, 500 mg, Oral, Q6H PRN, Molly Maduro, MD .  amLODipine (NORVASC) tablet 5 mg, 5 mg, Oral, Daily, Molly Maduro, MD, 5 mg at 09/01/14 0945 .  ferrous sulfate tablet 325 mg, 325 mg, Oral, Q breakfast, Molly Maduro, MD, 325 mg at 09/01/14 0945 .  hydrocortisone valerate ointment (WEST-CORT) 0.2 % 1 application, 1 application, Topical, BID, Molly Maduro, MD, 1 application at 32/99/24 0559 .  ibuprofen (ADVIL,MOTRIN) tablet 400 mg, 400 mg, Oral, Q4H PRN, Molly Maduro, MD .  ketorolac (TORADOL) 15 MG/ML injection 15 mg, 15 mg, Intravenous, 4 times per day, Molly Maduro, MD, 15 mg at 09/02/14 0555 .  metFORMIN (GLUCOPHAGE) tablet 500 mg, 500 mg, Oral, BID WC, Molly Maduro, MD, 500 mg at 09/01/14 1807 .  metoprolol tartrate (LOPRESSOR) tablet 25 mg, 25 mg, Oral, BID, Molly Maduro, MD, 25 mg at 09/01/14 2248 .  morphine 2 MG/ML injection  2-5 mg, 2-5 mg, Intravenous, Q2H PRN, Molly Maduro, MD .  ondansetron [facility]) tablet 4 mg, 4 mg, Oral, Q6H PRN **OR** ondansetron (ZOFRAN) injection 4 mg, 4 mg, Intravenous, Q6H PRN, Molly Maduro, MD, 4 mg at 08/31/14 1804 .  oxyCODONE-acetaminophen (PERCOCET/ROXICET) 5-325 MG per tablet 1 tablet, 1 tablet, Oral, Q8H PRN, Molly Maduro, MD .  oxyCODONE-acetaminophen (PERCOCET/ROXICET) 5-325 MG per tablet 1-2 tablet, 1-2 tablet, Oral, Q4H PRN, Molly Maduro, MD  Follwup: Follow-up Information    Follow up with Consuela Mimes, MD In 1 week.   Specialty:  Surgery   Contact information:   [address] [place] [id number] [phone]       Signed: Consuela Mimes 09/02/2014, 8:43 AM

## 2014-09-05 ENCOUNTER — Encounter: Payer: Self-pay | Admitting: Surgery

## 2014-09-13 ENCOUNTER — Ambulatory Visit: Payer: PPO | Admitting: Oncology

## 2014-09-16 ENCOUNTER — Other Ambulatory Visit: Payer: Self-pay | Admitting: Internal Medicine

## 2014-09-19 ENCOUNTER — Inpatient Hospital Stay: Payer: PPO | Attending: Hematology and Oncology | Admitting: Hematology and Oncology

## 2014-09-19 ENCOUNTER — Encounter: Payer: Self-pay | Admitting: *Deleted

## 2014-09-19 ENCOUNTER — Ambulatory Visit: Payer: PPO | Admitting: Hematology and Oncology

## 2014-09-19 ENCOUNTER — Inpatient Hospital Stay: Payer: PPO

## 2014-09-19 VITALS — BP 151/69 | HR 86 | Temp 98.0°F | Ht 61.0 in | Wt 111.1 lb

## 2014-09-19 DIAGNOSIS — C779 Secondary and unspecified malignant neoplasm of lymph node, unspecified: Secondary | ICD-10-CM

## 2014-09-19 DIAGNOSIS — C50911 Malignant neoplasm of unspecified site of right female breast: Secondary | ICD-10-CM

## 2014-09-19 DIAGNOSIS — E119 Type 2 diabetes mellitus without complications: Secondary | ICD-10-CM

## 2014-09-19 DIAGNOSIS — I2510 Atherosclerotic heart disease of native coronary artery without angina pectoris: Secondary | ICD-10-CM

## 2014-09-19 DIAGNOSIS — I10 Essential (primary) hypertension: Secondary | ICD-10-CM | POA: Diagnosis not present

## 2014-09-19 DIAGNOSIS — Z9011 Acquired absence of right breast and nipple: Secondary | ICD-10-CM

## 2014-09-19 DIAGNOSIS — D649 Anemia, unspecified: Secondary | ICD-10-CM | POA: Diagnosis not present

## 2014-09-19 DIAGNOSIS — Z79899 Other long term (current) drug therapy: Secondary | ICD-10-CM

## 2014-09-19 DIAGNOSIS — E785 Hyperlipidemia, unspecified: Secondary | ICD-10-CM

## 2014-09-19 DIAGNOSIS — R5383 Other fatigue: Secondary | ICD-10-CM

## 2014-09-19 DIAGNOSIS — Z170 Estrogen receptor positive status [ER+]: Secondary | ICD-10-CM | POA: Diagnosis not present

## 2014-09-19 DIAGNOSIS — R531 Weakness: Secondary | ICD-10-CM

## 2014-09-19 DIAGNOSIS — Z7982 Long term (current) use of aspirin: Secondary | ICD-10-CM

## 2014-09-19 DIAGNOSIS — M129 Arthropathy, unspecified: Secondary | ICD-10-CM

## 2014-09-19 DIAGNOSIS — R634 Abnormal weight loss: Secondary | ICD-10-CM | POA: Diagnosis not present

## 2014-09-19 DIAGNOSIS — R0602 Shortness of breath: Secondary | ICD-10-CM

## 2014-09-19 LAB — COMPREHENSIVE METABOLIC PANEL
ALT: 9 U/L — ABNORMAL LOW (ref 14–54)
AST: 19 U/L (ref 15–41)
Albumin: 4.3 g/dL (ref 3.5–5.0)
Alkaline Phosphatase: 66 U/L (ref 38–126)
Anion gap: 7 (ref 5–15)
BUN: 17 mg/dL (ref 6–20)
CO2: 26 mmol/L (ref 22–32)
Calcium: 9.2 mg/dL (ref 8.9–10.3)
Chloride: 103 mmol/L (ref 101–111)
Creatinine, Ser: 0.55 mg/dL (ref 0.44–1.00)
GFR calc Af Amer: >60 mL/min (ref >60)
GFR calc non Af Amer: >60 mL/min (ref >60)
Glucose, Bld: 168 mg/dL — ABNORMAL HIGH (ref 65–99)
Potassium: 3.5 mmol/L (ref 3.5–5.1)
Sodium: 136 mmol/L (ref 135–145)
Total Bilirubin: 0.4 mg/dL (ref 0.3–1.2)
Total Protein: 7.4 g/dL (ref 6.5–8.1)

## 2014-09-19 LAB — CBC WITH DIFFERENTIAL/PLATELET
Basophils Absolute: 0.1 10*3/uL (ref 0–0.1)
Basophils Relative: 2 %
Eosinophils Absolute: 0.1 10*3/uL (ref 0–0.7)
Eosinophils Relative: 2 %
HCT: 29.9 % — ABNORMAL LOW (ref 35.0–47.0)
Hemoglobin: 8.7 g/dL — ABNORMAL LOW (ref 12.0–16.0)
Lymphocytes Relative: 20 %
Lymphs Abs: 0.8 10*3/uL — ABNORMAL LOW (ref 1.0–3.6)
MCH: 20.2 pg — ABNORMAL LOW (ref 26.0–34.0)
MCHC: 29.1 g/dL — ABNORMAL LOW (ref 32.0–36.0)
MCV: 69.7 fL — ABNORMAL LOW (ref 80.0–100.0)
Monocytes Absolute: 0.4 10*3/uL (ref 0.2–0.9)
Monocytes Relative: 9 %
Neutro Abs: 2.9 10*3/uL (ref 1.4–6.5)
Neutrophils Relative %: 67 %
Platelets: 343 10*3/uL (ref 150–440)
RBC: 4.29 MIL/uL (ref 3.80–5.20)
RDW: 19.1 % — ABNORMAL HIGH (ref 11.5–14.5)
WBC: 4.3 10*3/uL (ref 3.6–11.0)

## 2014-09-19 LAB — RETICULOCYTES
RBC.: 4.29 MIL/uL (ref 3.80–5.20)
Retic Count, Absolute: 68.6 10*3/uL (ref 19.0–183.0)
Retic Ct Pct: 1.6 % (ref 0.4–3.1)

## 2014-09-19 LAB — FOLATE: Folate: 12.3 ng/mL (ref >5.9)

## 2014-09-19 LAB — IRON AND TIBC
Iron: 159 ug/dL (ref 28–170)
Saturation Ratios: 26 % (ref 10.4–31.8)
TIBC: 616 ug/dL — ABNORMAL HIGH (ref 250–450)
UIBC: 457 ug/dL

## 2014-09-19 LAB — TSH: TSH: 1.943 u[IU]/mL (ref 0.350–4.500)

## 2014-09-19 LAB — FERRITIN: Ferritin: 9 ng/mL — ABNORMAL LOW (ref 11–307)

## 2014-09-19 NOTE — Progress Notes (Signed)
Patient ID: Dawn Wiley, female   DOB: 1934-09-22, 80 y.o.   MRN: [id number] Met patient and her daughter today during her initial medical oncology consult with Dr. Mike Gip.  Patient with stage III breast cancer.  Status post right mastectomy and here today to discuss treatment options with medical oncology.  Gave patient breast cancer educational literature, "My Breast Cancer Treatment Handbook" by Josephine Igo, RN.  Offered support.  Reviewed plan with patient and daughter.  She is to call if she has any questions or needs.

## 2014-09-19 NOTE — Progress Notes (Signed)
Pt here today post right breast mastectomy 5/5 by Dr. Leanora Cover for consult today; offers no complaints today

## 2014-09-20 LAB — CANCER ANTIGEN 27.29: CA 27.29: 90.2 U/mL — ABNORMAL HIGH (ref 0.0–38.6)

## 2014-09-21 ENCOUNTER — Ambulatory Visit
Admission: RE | Admit: 2014-09-21 | Discharge: 2014-09-21 | Disposition: A | Discharge status: Home / Self Care | Payer: PPO | Source: Ambulatory Visit | Attending: Hematology and Oncology | Admitting: Hematology and Oncology

## 2014-09-21 DIAGNOSIS — C50911 Malignant neoplasm of unspecified site of right female breast: Secondary | ICD-10-CM | POA: Insufficient documentation

## 2014-09-21 LAB — GLUCOSE, CAPILLARY: Glucose-Capillary: 115 mg/dL — ABNORMAL HIGH (ref 65–99)

## 2014-09-21 MED ORDER — FLUDEOXYGLUCOSE F - 18 (FDG) INJECTION
11.4400 | Freq: Once | INTRAVENOUS | Status: AC | PRN
  Administered 2014-09-21: 11.44 via INTRAVENOUS

## 2014-09-25 DIAGNOSIS — C50911 Malignant neoplasm of unspecified site of right female breast: Secondary | ICD-10-CM | POA: Diagnosis not present

## 2014-09-26 ENCOUNTER — Other Ambulatory Visit: Payer: Self-pay

## 2014-09-26 DIAGNOSIS — C50919 Malignant neoplasm of unspecified site of unspecified female breast: Secondary | ICD-10-CM

## 2014-09-26 LAB — OCCULT BLOOD X 1 CARD TO LAB, STOOL: Fecal Occult Bld: NEGATIVE

## 2014-09-28 LAB — VITAMIN B12: Vitamin B-12: 265 pg/mL (ref 180–914)

## 2014-10-02 ENCOUNTER — Ambulatory Visit (HOSPITAL_BASED_OUTPATIENT_CLINIC_OR_DEPARTMENT_OTHER)
Admission: RE | Admit: 2014-10-02 | Discharge: 2014-10-02 | Disposition: A | Discharge status: Home / Self Care | Payer: PPO | Source: Ambulatory Visit | Attending: Hematology and Oncology | Admitting: Hematology and Oncology

## 2014-10-02 ENCOUNTER — Ambulatory Visit
Admission: RE | Admit: 2014-10-02 | Discharge: 2014-10-02 | Disposition: A | Discharge status: Home / Self Care | Payer: PPO | Source: Ambulatory Visit | Attending: Hematology and Oncology | Admitting: Hematology and Oncology

## 2014-10-02 DIAGNOSIS — C50911 Malignant neoplasm of unspecified site of right female breast: Secondary | ICD-10-CM | POA: Diagnosis not present

## 2014-10-02 DIAGNOSIS — I340 Nonrheumatic mitral (valve) insufficiency: Secondary | ICD-10-CM | POA: Diagnosis not present

## 2014-10-02 DIAGNOSIS — M810 Age-related osteoporosis without current pathological fracture: Secondary | ICD-10-CM | POA: Insufficient documentation

## 2014-10-02 NOTE — Progress Notes (Signed)
*  PRELIMINARY RESULTS* Echocardiogram 2D Echocardiogram has been performed.  Dawn Wiley 10/02/2014, 1:00 PM

## 2014-10-03 ENCOUNTER — Encounter: Payer: Self-pay | Admitting: Hematology and Oncology

## 2014-10-03 NOTE — Progress Notes (Signed)
[facility] day:  09/19/2014  Chief Complaint: Dawn Wiley is an 80 y.o. female with recently diagnosed stage IIIC right breast cancer who is referred in consultation by Dr. Leanora Cover for assessment and management.  HPI: The patient notes that her last mammogram was in 2011.  She states that she was sent a letter regarding a right breast mass for which biopsy was recommended. Given her prior history of negative biopsies and her right breast always being larger than her left breast, she states that she "ignored it".  Approximately 2 weeks prior to her presentation, she notes that a sore came on the outside for right breast and swelled up. She presented to Dr. Derrel Nip, her primary care physician, for evaluation. She states that she quickly had a mammogram, ultrasound, and surgery.  Mammogram on 08/15/2014 showed a large mass occupying two thirds of the right breast and involving all 4 quadrants. Maximum dimension was 11 cm. There were 2 adjacent lymph nodes in the lower right axilla suspicious for metastatic disease. On exam, there was a large palpable mass involving the right breast with skin breakdown in the upper outer quadrant.  She underwent a modified radical mastectomy and axillary lymph node dissection 08/31/2014. 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  Carcinoma involved skeletal muscle and dermal lymphatics.  Tumor was less than 0.5 mm from the deep margin. Thirteen of 15 lymph nodes were involved. The largest metastatic focus was 2 cm. Tumor was ER positive (1-10%), PR negative, and HER-2/neu negative. Pathologic stage was IIIC (pT3N3aMx).  The patient states that she tolerated surgery well. She states that she still has drain in, but comes out on 09/21/2014.  She notes that she has lost about 30 pounds in the past several months. She believes this is due to a change in her eating  habits. She notes some shortness of breath and weakness since before surgery.  She denies any bone or joint pain. She is taking a iron pill. She has had a rash on her back for 3-4 weeks.  She denies any family history of malignancy. She had 1 pregnancy at age 37. She did not breast-feed her child. She was never on birth control pills. She underwent menopause in her mid 40s/early 36s.  She has never been on hormone replacement therapy.  Past Medical History  Diagnosis Date  . Kidney stones   . Hyperlipidemia   . Coronary artery disease     Coronary calcifications noted on CT scan  . Diabetes mellitus without complication   . Nephrolithiasis   . Hypertension   . Arthritis   . Anemia   . Tachycardia     Dr. Fletcher Anon, cardiologist    Past Surgical History  Procedure Laterality Date  . Temporomandibular joint surgery    . Cataract extraction      bilateral  . Breast surgery      breast biopsy X 3  . Eye surgery      bilateral cataract extraction  . Mastectomy modified radical Right 08/31/2014    Procedure: MASTECTOMY MODIFIED RADICAL;  Surgeon: Molly Maduro, MD;  Location: [facility] ORS;  Service: General;  Laterality: Right;    Family History  Problem Relation Age of Onset  . Peripheral Artery Disease Sister     carotid artery stenosis   . Heart disease Sister   . Diabetes Sister     Social History:  reports that she has never smoked.  She has never used smokeless tobacco. She reports that she does not drink alcohol or use illicit drugs.  She lives alone.  The patient is accompanied by her daughter, Mariann Laster and Freda Munro, the breast navigator.  Allergies: No Known Allergies  Current Medications: Current Outpatient Prescriptions  Medication Sig Dispense Refill  . acetaminophen (TYLENOL) 500 MG tablet Take 500 mg by mouth every 6 (six) hours as needed for mild pain or moderate pain.     [name] Kitchen amLODipine (NORVASC) 5 MG tablet Take 1 tablet (5 mg total) by mouth daily. 90 tablet 1  .  aspirin 81 MG tablet Take 81 mg by mouth every other day.    . ferrous sulfate 325 (65 FE) MG tablet Take 325 mg by mouth daily with breakfast.    . hydrocortisone valerate ointment (WESTCORT) 0.2 % Apply 1 application topically 2 (two) times daily. 45 g 0  . metFORMIN (GLUCOPHAGE) 500 MG tablet Take 1 tablet (500 mg total) by mouth 2 (two) times daily with a meal. 180 tablet 1  . metFORMIN (GLUCOPHAGE) 500 MG tablet TAKE 1 TABLET (500 MG TOTAL) BY MOUTH 2 (TWO) TIMES DAILY WITH A MEAL. 180 tablet 1  . metoprolol tartrate (LOPRESSOR) 25 MG tablet Take 1 tablet (25 mg total) by mouth 2 (two) times daily. 180 tablet 1  . ibuprofen (ADVIL,MOTRIN) 400 MG tablet Take 1 tablet (400 mg total) by mouth every 4 (four) hours as needed for mild pain or moderate pain. (Patient not taking: Reported on 09/19/2014) 30 tablet 0   No current facility-administered medications for this visit.   Review of Systems:  GENERAL:  Fatigue.  No fevers or sweats.  Weight loss of 30# since 01/2014. PERFORMANCE STATUS (ECOG):  1-2 HEENT:  No visual changes, runny nose, sore throat, mouth sores or tenderness. Lungs: Shortness of breath.  No cough.  No hemoptysis. Cardiac:  No chest pain, palpitations, orthopnea, or PND. GI:  No nausea, vomiting, diarrhea, constipation, melena or hematochezia. GU:  No urgency, frequency, dysuria, or hematuria. Musculoskeletal:  No back pain.  No joint pain.  No muscle tenderness. Extremities:  No pain or swelling. Skin:  Rash on back x 3-4 weeks. Neuro:  No headache, numbness or weakness, balance or coordination issues. Endocrine:  No diabetes, thyroid issues, hot flashes or night sweats. Psych:  No mood changes, depression or anxiety. Pain:  No focal pain. Review of systems:  All other systems reviewed and found to be negative.   Physical Exam: Blood pressure 151/69, pulse 86, temperature 98 F (36.7 C), temperature source Tympanic, height 5' 1"  (1.549 m), weight 111 lb 1.8 oz (50.4  kg).  GENERAL:  Thin elderly woman sitting comfortably in the exam room in no acute distress. MENTAL STATUS:  Alert and oriented to person, place and time. HEAD:  Short gray hair.  Normocephalic, atraumatic, face symmetric, no Cushingoid features. EYES:  Blue eyes.  Pupils equal round and reactive to light and accomodation.  No conjunctivitis or scleral icterus. ENT:  Oropharynx clear without lesion.  Tongue normal. Mucous membranes moist.  RESPIRATORY:  Clear to auscultation without rales, wheezes or rhonchi. CARDIOVASCULAR:  Regular rate and rhythm without murmur, rub or gallop. BREAST:  Right mastectomy with drain in place.  Incision well healing.  No erythema or nodularity.  Left breast with fibrocystic changes.  No discrete masses, skin changes or nipple discharge. ABDOMEN:  Soft, non-tender, with active bowel sounds, and no hepatosplenomegaly.  No masses. BACK:  Kyphosis.  No tenderness on percussion  of the back or rib cage. SKIN:  Slight rash upper back. EXTREMITIES: No edema, no skin discoloration or tenderness.  No palpable cords. LYMPH NODES: No palpable cervical, supraclavicular, axillary or inguinal adenopathy  NEUROLOGICAL: Unremarkable. PSYCH:  Appropriate.   Appointment on 09/19/2014  Component Date Value Ref Range Status  . WBC 09/19/2014 4.3  3.6 - 11.0 K/uL Final  . RBC 09/19/2014 4.29  3.80 - 5.20 MIL/uL Final  . Hemoglobin 09/19/2014 8.7* 12.0 - 16.0 g/dL Final   RESULT REPEATED AND VERIFIED  . HCT 09/19/2014 29.9* 35.0 - 47.0 % Final  . MCV 09/19/2014 69.7* 80.0 - 100.0 fL Final  . MCH 09/19/2014 20.2* 26.0 - 34.0 pg Final  . MCHC 09/19/2014 29.1* 32.0 - 36.0 g/dL Final  . RDW 09/19/2014 19.1* 11.5 - 14.5 % Final  . Platelets 09/19/2014 343  150 - 440 K/uL Final  . Neutrophils Relative % 09/19/2014 67%   Final  . Neutro Abs 09/19/2014 2.9  1.4 - 6.5 K/uL Final  . Lymphocytes Relative 09/19/2014 20%   Final  . Lymphs Abs 09/19/2014 0.8* 1.0 - 3.6 K/uL Final  .  Monocytes Relative 09/19/2014 9%   Final  . Monocytes Absolute 09/19/2014 0.4  0.2 - 0.9 K/uL Final  . Eosinophils Relative 09/19/2014 2%   Final  . Eosinophils Absolute 09/19/2014 0.1  0 - 0.7 K/uL Final  . Basophils Relative 09/19/2014 2%   Final  . Basophils Absolute 09/19/2014 0.1  0 - 0.1 K/uL Final  . Retic Ct Pct 09/19/2014 1.6  0.4 - 3.1 % Final  . RBC. 09/19/2014 4.29  3.80 - 5.20 MIL/uL Final  . Retic Count, Manual 09/19/2014 68.6  19.0 - 183.0 K/uL Final  . Vitamin B-12 09/19/2014 265  180 - 914 pg/mL Final   Comment: PERFORMED BY [facility] BN [id number]   . Folate 09/19/2014 12.3  >5.9 ng/mL Final  . TSH 09/19/2014 1.943  0.350 - 4.500 uIU/mL Final  . Ferritin 09/19/2014 9* 11 - 307 ng/mL Final  . Iron 09/19/2014 159  28 - 170 ug/dL Final  . TIBC 09/19/2014 616* 250 - 450 ug/dL Final  . Saturation Ratios 09/19/2014 26  10.4 - 31.8 % Final  . UIBC 09/19/2014 457   Final  . CA 27.29 09/19/2014 90.2* 0.0 - 38.6 U/mL Final   Comment: (NOTE) [employer]/ACS methodology Performed At: [facility] [place], [place] [id number] Lindon Romp MD ZM:[id number]   . Sodium 09/19/2014 136  135 - 145 mmol/L Final  . Potassium 09/19/2014 3.5  3.5 - 5.1 mmol/L Final  . Chloride 09/19/2014 103  101 - 111 mmol/L Final  . CO2 09/19/2014 26  22 - 32 mmol/L Final  . Glucose, Bld 09/19/2014 168* 65 - 99 mg/dL Final  . BUN 09/19/2014 17  6 - 20 mg/dL Final  . Creatinine, Ser 09/19/2014 0.55  0.44 - 1.00 mg/dL Final  . Calcium 09/19/2014 9.2  8.9 - 10.3 mg/dL Final  . Total Protein 09/19/2014 7.4  6.5 - 8.1 g/dL Final  . Albumin 09/19/2014 4.3  3.5 - 5.0 g/dL Final  . AST 09/19/2014 19  15 - 41 U/L Final  . ALT 09/19/2014 9* 14 - 54 U/L Final  . Alkaline Phosphatase 09/19/2014 66  38 - 126 U/L Final  . Total Bilirubin 09/19/2014 0.4  0.3 - 1.2 mg/dL Final  . GFR calc non Af Amer 09/19/2014 >60  >60 mL/min Final  . GFR calc Af Amer 09/19/2014 >60  >  60 mL/min  Final   Comment: (NOTE) The eGFR has been calculated using the CKD EPI equation. This calculation has not been validated in all clinical situations. eGFR's persistently <60 mL/min signify possible Chronic Kidney Disease.   . Anion gap 09/19/2014 7  5 - 15 Final    Assessment:  Dawn Wiley is an 80 y.o. female with stage IIIC right breast cancer status post mastectomy and axillary lymph node dissection on 08/31/2014.  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  Carcinoma involved skeletal muscle and dermal lymphatics.  Tumor was less than 0.5 mm from the deep margin. Thirteen of 15 lymph nodes were involved. The largest metastatic focus was 2 cm. Tumor was ER positive (1-10%), PR negative, and HER-2/neu negative. Pathologic stage was T3N3aMx.  She has anemia.  Diet is modest.  She has never had a colonoscopy.  She denies any melena or hematochezia.  Symptomatically, she has lost 30# in the past 8-9 months.  She is weak and fatigued.  She is taking an iron pill.  Exam reveals post-operative changes with a drain in place.  Plan: 1. Review diagnosis of breast cancer. Review pathology in detail with patient and daughter. She has micropapillary disease which is aggressive and spreads to lymphatics quickly. She presented with a large breast mass and 13 lymph nodes positive. She is undergone resection with carcinoma involving muscle and dermal lymphatics. Current staging is IIIC. I am concerned about the risk for metastatic disease. I discussed a PET scan. I discussed her thoughts about therapy (chemotherapy, radiation therapy, and hormonal therapy) given her neglected breast cancer. I discussed aggressive chemotherapy followed by radiation and then hormonal therapy. The absolute benefit of hormonal therapy is likely small but given the slight ER positivity of her disease.  However, I would include hormonal therapy to provide additional  benefit given the aggressive nature of her disease. 2. Discuss the workup of anemia with labs (iron studies, B12, and folate) as well as guaiac cards. 3. Schedule PET scan to assess metastatic disease. 4. Schedule baseline echocardiogram and bone density study. 5. Discuss consultation with radiation oncology. Radiation would begin after completion of chemotherapy. 6. RTC to discuss results and direction of therapy.  [name], MD  09/19/2014, 4:06 PM

## 2014-10-05 ENCOUNTER — Encounter: Payer: Self-pay | Admitting: Surgery

## 2014-10-05 ENCOUNTER — Inpatient Hospital Stay: Payer: PPO | Attending: Hematology and Oncology | Admitting: Hematology and Oncology

## 2014-10-05 ENCOUNTER — Ambulatory Visit (INDEPENDENT_AMBULATORY_CARE_PROVIDER_SITE_OTHER): Payer: PPO | Admitting: Surgery

## 2014-10-05 ENCOUNTER — Ambulatory Visit: Payer: PPO | Admitting: Hematology and Oncology

## 2014-10-05 ENCOUNTER — Encounter: Payer: Self-pay | Admitting: Hematology and Oncology

## 2014-10-05 VITALS — BP 136/63 | HR 86 | Temp 98.6°F | Ht 62.0 in | Wt 112.4 lb

## 2014-10-05 VITALS — BP 136/74 | HR 84 | Temp 97.0°F | Resp 16 | Wt 111.8 lb

## 2014-10-05 DIAGNOSIS — Z7982 Long term (current) use of aspirin: Secondary | ICD-10-CM | POA: Diagnosis not present

## 2014-10-05 DIAGNOSIS — E538 Deficiency of other specified B group vitamins: Secondary | ICD-10-CM | POA: Diagnosis not present

## 2014-10-05 DIAGNOSIS — E119 Type 2 diabetes mellitus without complications: Secondary | ICD-10-CM | POA: Insufficient documentation

## 2014-10-05 DIAGNOSIS — R978 Other abnormal tumor markers: Secondary | ICD-10-CM | POA: Insufficient documentation

## 2014-10-05 DIAGNOSIS — Z170 Estrogen receptor positive status [ER+]: Secondary | ICD-10-CM | POA: Diagnosis not present

## 2014-10-05 DIAGNOSIS — E785 Hyperlipidemia, unspecified: Secondary | ICD-10-CM | POA: Diagnosis not present

## 2014-10-05 DIAGNOSIS — I10 Essential (primary) hypertension: Secondary | ICD-10-CM

## 2014-10-05 DIAGNOSIS — C779 Secondary and unspecified malignant neoplasm of lymph node, unspecified: Secondary | ICD-10-CM | POA: Insufficient documentation

## 2014-10-05 DIAGNOSIS — R5383 Other fatigue: Secondary | ICD-10-CM | POA: Diagnosis not present

## 2014-10-05 DIAGNOSIS — I2510 Atherosclerotic heart disease of native coronary artery without angina pectoris: Secondary | ICD-10-CM | POA: Insufficient documentation

## 2014-10-05 DIAGNOSIS — Z9011 Acquired absence of right breast and nipple: Secondary | ICD-10-CM | POA: Diagnosis not present

## 2014-10-05 DIAGNOSIS — Z87442 Personal history of urinary calculi: Secondary | ICD-10-CM | POA: Diagnosis not present

## 2014-10-05 DIAGNOSIS — Z79899 Other long term (current) drug therapy: Secondary | ICD-10-CM | POA: Diagnosis not present

## 2014-10-05 DIAGNOSIS — R531 Weakness: Secondary | ICD-10-CM | POA: Insufficient documentation

## 2014-10-05 DIAGNOSIS — R634 Abnormal weight loss: Secondary | ICD-10-CM | POA: Diagnosis not present

## 2014-10-05 DIAGNOSIS — C50911 Malignant neoplasm of unspecified site of right female breast: Secondary | ICD-10-CM

## 2014-10-05 DIAGNOSIS — M810 Age-related osteoporosis without current pathological fracture: Secondary | ICD-10-CM | POA: Insufficient documentation

## 2014-10-05 DIAGNOSIS — M818 Other osteoporosis without current pathological fracture: Secondary | ICD-10-CM | POA: Diagnosis not present

## 2014-10-05 DIAGNOSIS — D509 Iron deficiency anemia, unspecified: Secondary | ICD-10-CM | POA: Diagnosis not present

## 2014-10-05 NOTE — Progress Notes (Signed)
Outpatient Surgical Follow Up  10/05/2014  Dawn Wiley is an 80 y.o. female.   Chief Complaint  Patient presents with  . Follow-up    Right Brest Mastectomy    HPI: She returns for follow-up after her right modified radical mastectomy. Her drain has been in place for almost 5 weeks and she continues to have a little bit of serous drainage but does not appear to be any significant wound problems.  Past Medical History  Diagnosis Date  . Kidney stones   . Hyperlipidemia   . Coronary artery disease     Coronary calcifications noted on CT scan  . Diabetes mellitus without complication   . Nephrolithiasis   . Hypertension   . Arthritis   . Anemia   . Tachycardia     Dr. Fletcher Anon, cardiologist    Past Surgical History  Procedure Laterality Date  . Temporomandibular joint surgery    . Cataract extraction      bilateral  . Breast surgery      breast biopsy X 3  . Eye surgery      bilateral cataract extraction  . Mastectomy modified radical Right 08/31/2014    Procedure: MASTECTOMY MODIFIED RADICAL;  Surgeon: Molly Maduro, MD;  Location: [facility] ORS;  Service: General;  Laterality: Right;    Family History  Problem Relation Age of Onset  . Peripheral Artery Disease Sister     carotid artery stenosis   . Heart disease Sister   . Diabetes Sister     Social History:  reports that she has never smoked. She has never used smokeless tobacco. She reports that she does not drink alcohol or use illicit drugs.  Allergies: No Known Allergies  Medications reviewed.    ROS no change in her review of systems.    BP 136/63 mmHg  Pulse 86  Temp(Src) 98.6 F (37 C) (Oral)  Ht 5\' 2"  (1.575 m)  Wt 112 lb 6.4 oz (50.984 kg)  BMI 20.55 kg/m2  Physical Exam her breast looks good. There is no sign of any infection she has a little bit of swelling medially. The drain was removed.     No results found for this or any previous visit (from the past 48 hour(s)). No results  found.  Assessment/Plan:  1. Breast cancer, right We will plan to see her back as necessary following her evaluation with the oncologist. We did talk with her today about the possibility of needing a port in order to provide access for chemotherapy. If she chooses that option we would be able to set up a day surgery procedure for port placement. She is in agreement.     Dia Crawford III  10/05/2014,negative

## 2014-10-06 ENCOUNTER — Telehealth: Payer: Self-pay | Admitting: Surgery

## 2014-10-06 NOTE — Telephone Encounter (Signed)
Tube site is leaking and gauze is soaked. Please call today.

## 2014-10-06 NOTE — Telephone Encounter (Signed)
Patient called with concerns about continual drainage from her drain incision site. The drain tube was removed yesterday by Dr Pat Patrick. Patient reported that clear-yellow drainage has soaked through multiple guaze bandages since the removal. No blood reported in the fluid and no symptoms of fever or pain. Informed patient that once a drain tube is pulled it is not unusual for the remaining fluid to exit through the drain incision site. Patient directed to call our office on Monday if the amount of drainage fluid has not decreased. Patient confirmed understanding of information and directions.

## 2014-10-11 ENCOUNTER — Encounter
Admission: RE | Admit: 2014-10-11 | Discharge: 2014-10-11 | Disposition: A | Discharge status: Home / Self Care | Payer: PPO | Source: Ambulatory Visit | Attending: Hematology and Oncology | Admitting: Hematology and Oncology

## 2014-10-11 DIAGNOSIS — C50911 Malignant neoplasm of unspecified site of right female breast: Secondary | ICD-10-CM | POA: Diagnosis not present

## 2014-10-11 HISTORY — DX: Malignant (primary) neoplasm, unspecified: C80.1

## 2014-10-11 MED ORDER — TECHNETIUM TC 99M MEDRONATE IV KIT
25.0000 | PACK | Freq: Once | INTRAVENOUS | Status: AC | PRN
  Administered 2014-10-11: 23.02 via INTRAVENOUS

## 2014-10-12 ENCOUNTER — Ambulatory Visit: Payer: PPO

## 2014-10-12 ENCOUNTER — Encounter: Payer: Self-pay | Admitting: Hematology and Oncology

## 2014-10-12 ENCOUNTER — Encounter: Payer: Self-pay | Admitting: *Deleted

## 2014-10-12 ENCOUNTER — Telehealth: Payer: Self-pay | Admitting: *Deleted

## 2014-10-12 ENCOUNTER — Inpatient Hospital Stay (HOSPITAL_BASED_OUTPATIENT_CLINIC_OR_DEPARTMENT_OTHER): Payer: PPO | Admitting: Hematology and Oncology

## 2014-10-12 ENCOUNTER — Ambulatory Visit
Admission: RE | Admit: 2014-10-12 | Discharge: 2014-10-12 | Disposition: A | Discharge status: Home / Self Care | Payer: PPO | Source: Ambulatory Visit | Attending: Hematology and Oncology | Admitting: Hematology and Oncology

## 2014-10-12 VITALS — BP 136/77 | HR 81 | Temp 98.3°F | Resp 16 | Wt 111.3 lb

## 2014-10-12 DIAGNOSIS — I2510 Atherosclerotic heart disease of native coronary artery without angina pectoris: Secondary | ICD-10-CM

## 2014-10-12 DIAGNOSIS — Z9011 Acquired absence of right breast and nipple: Secondary | ICD-10-CM

## 2014-10-12 DIAGNOSIS — R531 Weakness: Secondary | ICD-10-CM

## 2014-10-12 DIAGNOSIS — C779 Secondary and unspecified malignant neoplasm of lymph node, unspecified: Secondary | ICD-10-CM

## 2014-10-12 DIAGNOSIS — Z7982 Long term (current) use of aspirin: Secondary | ICD-10-CM

## 2014-10-12 DIAGNOSIS — E538 Deficiency of other specified B group vitamins: Secondary | ICD-10-CM

## 2014-10-12 DIAGNOSIS — C50911 Malignant neoplasm of unspecified site of right female breast: Secondary | ICD-10-CM

## 2014-10-12 DIAGNOSIS — C50919 Malignant neoplasm of unspecified site of unspecified female breast: Secondary | ICD-10-CM

## 2014-10-12 DIAGNOSIS — Z87442 Personal history of urinary calculi: Secondary | ICD-10-CM

## 2014-10-12 DIAGNOSIS — I10 Essential (primary) hypertension: Secondary | ICD-10-CM

## 2014-10-12 DIAGNOSIS — M4856XA Collapsed vertebra, not elsewhere classified, lumbar region, initial encounter for fracture: Secondary | ICD-10-CM | POA: Insufficient documentation

## 2014-10-12 DIAGNOSIS — M818 Other osteoporosis without current pathological fracture: Secondary | ICD-10-CM

## 2014-10-12 DIAGNOSIS — C170 Malignant neoplasm of duodenum: Secondary | ICD-10-CM

## 2014-10-12 DIAGNOSIS — D509 Iron deficiency anemia, unspecified: Secondary | ICD-10-CM

## 2014-10-12 DIAGNOSIS — E119 Type 2 diabetes mellitus without complications: Secondary | ICD-10-CM

## 2014-10-12 DIAGNOSIS — Z79899 Other long term (current) drug therapy: Secondary | ICD-10-CM

## 2014-10-12 DIAGNOSIS — R634 Abnormal weight loss: Secondary | ICD-10-CM

## 2014-10-12 DIAGNOSIS — E785 Hyperlipidemia, unspecified: Secondary | ICD-10-CM

## 2014-10-12 DIAGNOSIS — M8580 Other specified disorders of bone density and structure, unspecified site: Secondary | ICD-10-CM | POA: Insufficient documentation

## 2014-10-12 DIAGNOSIS — X58XXXA Exposure to other specified factors, initial encounter: Secondary | ICD-10-CM | POA: Insufficient documentation

## 2014-10-12 DIAGNOSIS — R5383 Other fatigue: Secondary | ICD-10-CM

## 2014-10-12 DIAGNOSIS — R978 Other abnormal tumor markers: Secondary | ICD-10-CM

## 2014-10-12 MED ORDER — CYANOCOBALAMIN 1000 MCG/ML IJ SOLN
1000.0000 ug | Freq: Once | INTRAMUSCULAR | Status: AC
  Administered 2014-10-12: 1000 ug via INTRAMUSCULAR
  Filled 2014-10-12: qty 1

## 2014-10-12 NOTE — Patient Instructions (Addendum)
Doxorubicin injection What is this medicine? DOXORUBICIN (dox oh ROO bi sin) is a chemotherapy drug. It is used to treat many kinds of cancer like Hodgkin's disease, leukemia, non-Hodgkin's lymphoma, neuroblastoma, sarcoma, and Wilms' tumor. It is also used to treat bladder cancer, breast cancer, lung cancer, ovarian cancer, stomach cancer, and thyroid cancer. This medicine may be used for other purposes; ask your health care provider or pharmacist if you have questions. COMMON BRAND NAME(S): Adriamycin, Adriamycin PFS, Adriamycin RDF, Rubex What should I tell my health care provider before I take this medicine? They need to know if you have any of these conditions: -blood disorders -heart disease, recent heart attack -infection (especially a virus infection such as chickenpox, cold sores, or herpes) -irregular heartbeat -liver disease -recent or ongoing radiation therapy -an unusual or allergic reaction to doxorubicin, other chemotherapy agents, other medicines, foods, dyes, or preservatives -pregnant or trying to get pregnant -breast-feeding How should I use this medicine? This drug is given as an infusion into a vein. It is administered in a hospital or clinic by a specially trained health care professional. If you have pain, swelling, burning or any unusual feeling around the site of your injection, tell your health care professional right away. Talk to your pediatrician regarding the use of this medicine in children. Special care may be needed. Overdosage: If you think you have taken too much of this medicine contact a poison control center or emergency room at once. NOTE: This medicine is only for you. Do not share this medicine with others. What if I miss a dose? It is important not to miss your dose. Call your doctor or health care professional if you are unable to keep an appointment. What may interact with this medicine? Do not take this medicine with any of the following  medications: -cisapride -droperidol -halofantrine -pimozide -zidovudine This medicine may also interact with the following medications: -chloroquine -chlorpromazine -clarithromycin -cyclophosphamide -cyclosporine -erythromycin -medicines for depression, anxiety, or psychotic disturbances -medicines for irregular heart beat like amiodarone, bepridil, dofetilide, encainide, flecainide, propafenone, quinidine -medicines for seizures like ethotoin, fosphenytoin, phenytoin -medicines for nausea, vomiting like dolasetron, ondansetron, palonosetron -medicines to increase blood counts like filgrastim, pegfilgrastim, sargramostim -methadone -methotrexate -pentamidine -progesterone -vaccines -verapamil Talk to your doctor or health care professional before taking any of these medicines: -acetaminophen -aspirin -ibuprofen -ketoprofen -naproxen This list may not describe all possible interactions. Give your health care provider a list of all the medicines, herbs, non-prescription drugs, or dietary supplements you use. Also tell them if you smoke, drink alcohol, or use illegal drugs. Some items may interact with your medicine. What should I watch for while using this medicine? Your condition will be monitored carefully while you are receiving this medicine. You will need important blood work done while you are taking this medicine. This drug may make you feel generally unwell. This is not uncommon, as chemotherapy can affect healthy cells as well as cancer cells. Report any side effects. Continue your course of treatment even though you feel ill unless your doctor tells you to stop. Your urine may turn red for a few days after your dose. This is not blood. If your urine is dark or brown, call your doctor. In some cases, you may be given additional medicines to help with side effects. Follow all directions for their use. Call your doctor or health care professional for advice if you get a  fever, chills or sore throat, or other symptoms of a cold or flu. Do not   treat yourself. This drug decreases your body's ability to fight infections. Try to avoid being around people who are sick. This medicine may increase your risk to bruise or bleed. Call your doctor or health care professional if you notice any unusual bleeding. Be careful brushing and flossing your teeth or using a toothpick because you may get an infection or bleed more easily. If you have any dental work done, tell your dentist you are receiving this medicine. Avoid taking products that contain aspirin, acetaminophen, ibuprofen, naproxen, or ketoprofen unless instructed by your doctor. These medicines may hide a fever. Men and women of childbearing age should use effective birth control methods while using taking this medicine. Do not become pregnant while taking this medicine. There is a potential for serious side effects to an unborn child. Talk to your health care professional or pharmacist for more information. Do not breast-feed an infant while taking this medicine. Do not let others touch your urine or other body fluids for 5 days after each treatment with this medicine. Caregivers should wear latex gloves to avoid touching body fluids during this time. There is a maximum amount of this medicine you should receive throughout your life. The amount depends on the medical condition being treated and your overall health. Your doctor will watch how much of this medicine you receive in your lifetime. Tell your doctor if you have taken this medicine before. What side effects may I notice from receiving this medicine? Side effects that you should report to your doctor or health care professional as soon as possible: -allergic reactions like skin rash, itching or hives, swelling of the face, lips, or tongue -low blood counts - this medicine may decrease the number of white blood cells, red blood cells and platelets. You may be at  increased risk for infections and bleeding. -signs of infection - fever or chills, cough, sore throat, pain or difficulty passing urine -signs of decreased platelets or bleeding - bruising, pinpoint red spots on the skin, black, tarry stools, blood in the urine -signs of decreased red blood cells - unusually weak or tired, fainting spells, lightheadedness -breathing problems -chest pain -fast, irregular heartbeat -mouth sores -nausea, vomiting -pain, swelling, redness at site where injected -pain, tingling, numbness in the hands or feet -swelling of ankles, feet, or hands -unusual bleeding or bruising Side effects that usually do not require medical attention (report to your doctor or health care professional if they continue or are bothersome): -diarrhea -facial flushing -hair loss -loss of appetite -missed menstrual periods -nail discoloration or damage -red or watery eyes -red colored urine -stomach upset This list may not describe all possible side effects. Call your doctor for medical advice about side effects. You may report side effects to FDA at 1-800-FDA-1088. Where should I keep my medicine? This drug is given in a hospital or clinic and will not be stored at home. NOTE: This sheet is a summary. It may not cover all possible information. If you have questions about this medicine, talk to your doctor, pharmacist, or health care provider.  2015, [employer]. (2012-08-10 09:54:34) Cyclophosphamide injection What is this medicine? CYCLOPHOSPHAMIDE (sye kloe FOSS fa mide) is a chemotherapy drug. It slows the growth of cancer cells. This medicine is used to treat many types of cancer like lymphoma, myeloma, leukemia, breast cancer, and ovarian cancer, to name a few. This medicine may be used for other purposes; ask your health care provider or pharmacist if you have questions. COMMON BRAND NAME(S): Cytoxan,  Neosar What should I tell my health care provider before I  take this medicine? They need to know if you have any of these conditions: -blood disorders -history of other chemotherapy -infection -kidney disease -liver disease -recent or ongoing radiation therapy -tumors in the bone marrow -an unusual or allergic reaction to cyclophosphamide, other chemotherapy, other medicines, foods, dyes, or preservatives -pregnant or trying to get pregnant -breast-feeding How should I use this medicine? This drug is usually given as an injection into a vein or muscle or by infusion into a vein. It is administered in a hospital or clinic by a specially trained health care professional. Talk to your pediatrician regarding the use of this medicine in children. Special care may be needed. Overdosage: If you think you have taken too much of this medicine contact a poison control center or emergency room at once. NOTE: This medicine is only for you. Do not share this medicine with others. What if I miss a dose? It is important not to miss your dose. Call your doctor or health care professional if you are unable to keep an appointment. What may interact with this medicine? This medicine may interact with the following medications: -amiodarone -amphotericin B -azathioprine -certain antiviral medicines for HIV or AIDS such as protease inhibitors (e.g., indinavir, ritonavir) and zidovudine -certain blood pressure medications such as benazepril, captopril, enalapril, fosinopril, lisinopril, moexipril, monopril, perindopril, quinapril, ramipril, trandolapril -certain cancer medications such as anthracyclines (e.g., daunorubicin, doxorubicin), busulfan, cytarabine, paclitaxel, pentostatin, tamoxifen, trastuzumab -certain diuretics such as chlorothiazide, chlorthalidone, hydrochlorothiazide, indapamide, metolazone -certain medicines that treat or prevent blood clots like warfarin -certain muscle relaxants such as  succinylcholine -cyclosporine -etanercept -indomethacin -medicines to increase blood counts like filgrastim, pegfilgrastim, sargramostim -medicines used as general anesthesia -metronidazole -natalizumab This list may not describe all possible interactions. Give your health care provider a list of all the medicines, herbs, non-prescription drugs, or dietary supplements you use. Also tell them if you smoke, drink alcohol, or use illegal drugs. Some items may interact with your medicine. What should I watch for while using this medicine? Visit your doctor for checks on your progress. This drug may make you feel generally unwell. This is not uncommon, as chemotherapy can affect healthy cells as well as cancer cells. Report any side effects. Continue your course of treatment even though you feel ill unless your doctor tells you to stop. Drink water or other fluids as directed. Urinate often, even at night. In some cases, you may be given additional medicines to help with side effects. Follow all directions for their use. Call your doctor or health care professional for advice if you get a fever, chills or sore throat, or other symptoms of a cold or flu. Do not treat yourself. This drug decreases your body's ability to fight infections. Try to avoid being around people who are sick. This medicine may increase your risk to bruise or bleed. Call your doctor or health care professional if you notice any unusual bleeding. Be careful brushing and flossing your teeth or using a toothpick because you may get an infection or bleed more easily. If you have any dental work done, tell your dentist you are receiving this medicine. You may get drowsy or dizzy. Do not drive, use machinery, or do anything that needs mental alertness until you know how this medicine affects you. Do not become pregnant while taking this medicine or for 1 year after stopping it. Women should inform their doctor if they wish to become  pregnant or think they might be pregnant. Men should not father a child while taking this medicine and for 4 months after stopping it. There is a potential for serious side effects to an unborn child. Talk to your health care professional or pharmacist for more information. Do not breast-feed an infant while taking this medicine. This medicine may interfere with the ability to have a child. This medicine has caused ovarian failure in some women. This medicine has caused reduced sperm counts in some men. You should talk with your doctor or health care professional if you are concerned about your fertility. If you are going to have surgery, tell your doctor or health care professional that you have taken this medicine. What side effects may I notice from receiving this medicine? Side effects that you should report to your doctor or health care professional as soon as possible: -allergic reactions like skin rash, itching or hives, swelling of the face, lips, or tongue -low blood counts - this medicine may decrease the number of white blood cells, red blood cells and platelets. You may be at increased risk for infections and bleeding. -signs of infection - fever or chills, cough, sore throat, pain or difficulty passing urine -signs of decreased platelets or bleeding - bruising, pinpoint red spots on the skin, black, tarry stools, blood in the urine -signs of decreased red blood cells - unusually weak or tired, fainting spells, lightheadedness -breathing problems -dark urine -dizziness -palpitations -swelling of the ankles, feet, hands -trouble passing urine or change in the amount of urine -weight gain -yellowing of the eyes or skin Side effects that usually do not require medical attention (report to your doctor or health care professional if they continue or are bothersome): -changes in nail or skin color -hair loss -missed menstrual periods -mouth sores -nausea, vomiting This list may not  describe all possible side effects. Call your doctor for medical advice about side effects. You may report side effects to FDA at 1-800-FDA-1088. Where should I keep my medicine? This drug is given in a hospital or clinic and will not be stored at home. NOTE: This sheet is a summary. It may not cover all possible information. If you have questions about this medicine, talk to your doctor, pharmacist, or health care provider.  2015, [employer]. (2012-02-27 16:22:58) Paclitaxel injection What is this medicine? PACLITAXEL (PAK li TAX el) is a chemotherapy drug. It targets fast dividing cells, like cancer cells, and causes these cells to die. This medicine is used to treat ovarian cancer, breast cancer, and other cancers. This medicine may be used for other purposes; ask your health care provider or pharmacist if you have questions. COMMON BRAND NAME(S): Onxol, Taxol What should I tell my health care provider before I take this medicine? They need to know if you have any of these conditions: -blood disorders -irregular heartbeat -infection (especially a virus infection such as chickenpox, cold sores, or herpes) -liver disease -previous or ongoing radiation therapy -an unusual or allergic reaction to paclitaxel, alcohol, polyoxyethylated castor oil, other chemotherapy agents, other medicines, foods, dyes, or preservatives -pregnant or trying to get pregnant -breast-feeding How should I use this medicine? This drug is given as an infusion into a vein. It is administered in a hospital or clinic by a specially trained health care professional. Talk to your pediatrician regarding the use of this medicine in children. Special care may be needed. Overdosage: If you think you have taken too much of this medicine contact a poison  control center or emergency room at once. NOTE: This medicine is only for you. Do not share this medicine with others. What if I miss a dose? It is important not  to miss your dose. Call your doctor or health care professional if you are unable to keep an appointment. What may interact with this medicine? Do not take this medicine with any of the following medications: -disulfiram -metronidazole This medicine may also interact with the following medications: -cyclosporine -diazepam -ketoconazole -medicines to increase blood counts like filgrastim, pegfilgrastim, sargramostim -other chemotherapy drugs like cisplatin, doxorubicin, epirubicin, etoposide, teniposide, vincristine -quinidine -testosterone -vaccines -verapamil Talk to your doctor or health care professional before taking any of these medicines: -acetaminophen -aspirin -ibuprofen -ketoprofen -naproxen This list may not describe all possible interactions. Give your health care provider a list of all the medicines, herbs, non-prescription drugs, or dietary supplements you use. Also tell them if you smoke, drink alcohol, or use illegal drugs. Some items may interact with your medicine. What should I watch for while using this medicine? Your condition will be monitored carefully while you are receiving this medicine. You will need important blood work done while you are taking this medicine. This drug may make you feel generally unwell. This is not uncommon, as chemotherapy can affect healthy cells as well as cancer cells. Report any side effects. Continue your course of treatment even though you feel ill unless your doctor tells you to stop. In some cases, you may be given additional medicines to help with side effects. Follow all directions for their use. Call your doctor or health care professional for advice if you get a fever, chills or sore throat, or other symptoms of a cold or flu. Do not treat yourself. This drug decreases your body's ability to fight infections. Try to avoid being around people who are sick. This medicine may increase your risk to bruise or bleed. Call your doctor or  health care professional if you notice any unusual bleeding. Be careful brushing and flossing your teeth or using a toothpick because you may get an infection or bleed more easily. If you have any dental work done, tell your dentist you are receiving this medicine. Avoid taking products that contain aspirin, acetaminophen, ibuprofen, naproxen, or ketoprofen unless instructed by your doctor. These medicines may hide a fever. Do not become pregnant while taking this medicine. Women should inform their doctor if they wish to become pregnant or think they might be pregnant. There is a potential for serious side effects to an unborn child. Talk to your health care professional or pharmacist for more information. Do not breast-feed an infant while taking this medicine. Men are advised not to father a child while receiving this medicine. What side effects may I notice from receiving this medicine? Side effects that you should report to your doctor or health care professional as soon as possible: -allergic reactions like skin rash, itching or hives, swelling of the face, lips, or tongue -low blood counts - This drug may decrease the number of white blood cells, red blood cells and platelets. You may be at increased risk for infections and bleeding. -signs of infection - fever or chills, cough, sore throat, pain or difficulty passing urine -signs of decreased platelets or bleeding - bruising, pinpoint red spots on the skin, black, tarry stools, nosebleeds -signs of decreased red blood cells - unusually weak or tired, fainting spells, lightheadedness -breathing problems -chest pain -high or low blood pressure -mouth sores -nausea and vomiting -pain,  swelling, redness or irritation at the injection site -pain, tingling, numbness in the hands or feet -slow or irregular heartbeat -swelling of the ankle, feet, hands Side effects that usually do not require medical attention (report to your doctor or health  care professional if they continue or are bothersome): -bone pain -complete hair loss including hair on your head, underarms, pubic hair, eyebrows, and eyelashes -changes in the color of fingernails -diarrhea -loosening of the fingernails -loss of appetite -muscle or joint pain -red flush to skin -sweating This list may not describe all possible side effects. Call your doctor for medical advice about side effects. You may report side effects to FDA at 1-800-FDA-1088. Where should I keep my medicine? This drug is given in a hospital or clinic and will not be stored at home. NOTE: This sheet is a summary. It may not cover all possible information. If you have questions about this medicine, talk to your doctor, pharmacist, or health care provider.  2015, [employer]. (2012-06-07 16:41:21) Carboplatin injection What is this medicine? CARBOPLATIN (KAR boe pla tin) is a chemotherapy drug. It targets fast dividing cells, like cancer cells, and causes these cells to die. This medicine is used to treat ovarian cancer and many other cancers. This medicine may be used for other purposes; ask your health care provider or pharmacist if you have questions. COMMON BRAND NAME(S): Paraplatin What should I tell my health care provider before I take this medicine? They need to know if you have any of these conditions: -blood disorders -hearing problems -kidney disease -recent or ongoing radiation therapy -an unusual or allergic reaction to carboplatin, cisplatin, other chemotherapy, other medicines, foods, dyes, or preservatives -pregnant or trying to get pregnant -breast-feeding How should I use this medicine? This drug is usually given as an infusion into a vein. It is administered in a hospital or clinic by a specially trained health care professional. Talk to your pediatrician regarding the use of this medicine in children. Special care may be needed. Overdosage: If you think you have  taken too much of this medicine contact a poison control center or emergency room at once. NOTE: This medicine is only for you. Do not share this medicine with others. What if I miss a dose? It is important not to miss a dose. Call your doctor or health care professional if you are unable to keep an appointment. What may interact with this medicine? -medicines for seizures -medicines to increase blood counts like filgrastim, pegfilgrastim, sargramostim -some antibiotics like amikacin, gentamicin, neomycin, streptomycin, tobramycin -vaccines Talk to your doctor or health care professional before taking any of these medicines: -acetaminophen -aspirin -ibuprofen -ketoprofen -naproxen This list may not describe all possible interactions. Give your health care provider a list of all the medicines, herbs, non-prescription drugs, or dietary supplements you use. Also tell them if you smoke, drink alcohol, or use illegal drugs. Some items may interact with your medicine. What should I watch for while using this medicine? Your condition will be monitored carefully while you are receiving this medicine. You will need important blood work done while you are taking this medicine. This drug may make you feel generally unwell. This is not uncommon, as chemotherapy can affect healthy cells as well as cancer cells. Report any side effects. Continue your course of treatment even though you feel ill unless your doctor tells you to stop. In some cases, you may be given additional medicines to help with side effects. Follow all directions for their use.  Call your doctor or health care professional for advice if you get a fever, chills or sore throat, or other symptoms of a cold or flu. Do not treat yourself. This drug decreases your body's ability to fight infections. Try to avoid being around people who are sick. This medicine may increase your risk to bruise or bleed. Call your doctor or health care professional  if you notice any unusual bleeding. Be careful brushing and flossing your teeth or using a toothpick because you may get an infection or bleed more easily. If you have any dental work done, tell your dentist you are receiving this medicine. Avoid taking products that contain aspirin, acetaminophen, ibuprofen, naproxen, or ketoprofen unless instructed by your doctor. These medicines may hide a fever. Do not become pregnant while taking this medicine. Women should inform their doctor if they wish to become pregnant or think they might be pregnant. There is a potential for serious side effects to an unborn child. Talk to your health care professional or pharmacist for more information. Do not breast-feed an infant while taking this medicine. What side effects may I notice from receiving this medicine? Side effects that you should report to your doctor or health care professional as soon as possible: -allergic reactions like skin rash, itching or hives, swelling of the face, lips, or tongue -signs of infection - fever or chills, cough, sore throat, pain or difficulty passing urine -signs of decreased platelets or bleeding - bruising, pinpoint red spots on the skin, black, tarry stools, nosebleeds -signs of decreased red blood cells - unusually weak or tired, fainting spells, lightheadedness -breathing problems -changes in hearing -changes in vision -chest pain -high blood pressure -low blood counts - This drug may decrease the number of white blood cells, red blood cells and platelets. You may be at increased risk for infections and bleeding. -nausea and vomiting -pain, swelling, redness or irritation at the injection site -pain, tingling, numbness in the hands or feet -problems with balance, talking, walking -trouble passing urine or change in the amount of urine Side effects that usually do not require medical attention (report to your doctor or health care professional if they continue or are  bothersome): -hair loss -loss of appetite -metallic taste in the mouth or changes in taste This list may not describe all possible side effects. Call your doctor for medical advice about side effects. You may report side effects to FDA at 1-800-FDA-1088. Where should I keep my medicine? This drug is given in a hospital or clinic and will not be stored at home. NOTE: This sheet is a summary. It may not cover all possible information. If you have questions about this medicine, talk to your doctor, pharmacist, or health care provider.  2015, Elsevier/Gold Standard. (2007-07-20 14:38:05)

## 2014-10-12 NOTE — Progress Notes (Signed)
[facility] day:  10/12/2014  Chief Complaint: Dawn Wiley is an 80 y.o. female with stage IIIc right breast cancer who is seen for review of interval bone scan and finalization of treatment plan.  HPI: The patient was last seen in the medical oncology clinic on 10/05/2014.  At that time, interval PET scan, bone density study and echocardiogram were reviewed. PET scan did not reveal evidence of metastatic disease. As of her elevated tumor marker was elevated and she had vague bone symptoms, decision was made to pursue a bone scan.  Bone scan on 10/11/2014 revealed no evidence of metastatic disease.  At last visit, we also discussed her iron deficiency anemia. Iron rich foods and taking oral iron with OJ or vitamin C was discussed. Guaiac cards sent. We discussed her B12 deficiency and no supplementation since 06/2014. B12 was to be preauthorized.  At last visit, the patient declined follow-up with radiation oncology.  Since last visit, she has thought about our conversation. She does not like the side effects associated with various chemotherapy agents discussed.   According to her daughter and confirmed by the patient, at the back of her mind she is considering no treatment at all. She is concerned about wasting 6 months of time where her quality of life declines and her disease may not be cured. She states that she is leaning toward "easy chemotherapy".  In discussing a potential neuropathy associated with taxanes, the patient notes that her toes stay cold.  She also notes a little bit of burning.  Past Medical History  Diagnosis Date  . Kidney stones   . Hyperlipidemia   . Coronary artery disease     Coronary calcifications noted on CT scan  . Diabetes mellitus without complication   . Nephrolithiasis   . Hypertension   . Arthritis   . Anemia   . Tachycardia     Dr. Fletcher Anon, cardiologist  . Cancer     breast (right)    Past Surgical  History  Procedure Laterality Date  . Temporomandibular joint surgery    . Cataract extraction      bilateral  . Breast surgery      breast biopsy X 3  . Eye surgery      bilateral cataract extraction  . Mastectomy modified radical Right 08/31/2014    Procedure: MASTECTOMY MODIFIED RADICAL;  Surgeon: Molly Maduro, MD;  Location: [facility] ORS;  Service: General;  Laterality: Right;    Family History  Problem Relation Age of Onset  . Peripheral Artery Disease Sister     carotid artery stenosis   . Heart disease Sister   . Diabetes Sister     Social History:  reports that she has never smoked. She has never used smokeless tobacco. She reports that she does not drink alcohol or use illicit drugs.  The patient is accompanied by her daughter.  Allergies: No Known Allergies  Current Medications: Current Outpatient Prescriptions  Medication Sig Dispense Refill  . acetaminophen (TYLENOL) 500 MG tablet Take 500 mg by mouth every 6 (six) hours as needed for mild pain or moderate pain.     [name] Kitchen amLODipine (NORVASC) 5 MG tablet Take 1 tablet (5 mg total) by mouth daily. 90 tablet 1  . aspirin 81 MG tablet Take 81 mg by mouth every other day.    . diphenhydrAMINE (BENADRYL) 25 mg capsule Take 25 mg by mouth every 6 (six) hours as needed for allergies.    [name] Kitchen  ferrous sulfate 325 (65 FE) MG tablet Take 325 mg by mouth daily with breakfast.    . hydrocortisone valerate ointment (WESTCORT) 0.2 % Apply 1 application topically 2 (two) times daily. 45 g 0  . ibuprofen (ADVIL,MOTRIN) 400 MG tablet Take 1 tablet (400 mg total) by mouth every 4 (four) hours as needed for mild pain or moderate pain. 30 tablet 0  . metFORMIN (GLUCOPHAGE) 500 MG tablet Take 1 tablet (500 mg total) by mouth 2 (two) times daily with a meal. 180 tablet 1  . metoprolol tartrate (LOPRESSOR) 25 MG tablet Take 1 tablet (25 mg total) by mouth 2 (two) times daily. 180 tablet 1   No current facility-administered medications for this  visit.    Review of Systems:  GENERAL: Feels the same. No fevers or sweats. Weight loss of 30 pounds since 01/2014. PERFORMANCE STATUS (ECOG): 1-2 HEENT: Dental issues. No visual changes, runny nose, sore throat, mouth sores or tenderness. Lungs: Shortness of breath. No cough. No hemoptysis. Cardiac: No chest pain, palpitations, orthopnea, or PND. GI: No nausea, vomiting, diarrhea, constipation, melena or hematochezia. GU: No urgency, frequency, dysuria, or hematuria. Musculoskeletal: No back pain. No joint pain. No muscle tenderness. Extremities: No pain or swelling. Skin: Rash on back. Neuro: Toes stay cold and burn a little (neuropathy).  No headache, numbness or weakness, balance or coordination issues. Endocrine: No diabetes, thyroid issues, hot flashes or night sweats. Psych: No mood changes, depression or anxiety. Pain: No focal pain. Review of systems: All other systems reviewed and found to be negative.  Physical Exam: Blood pressure 136/77, pulse 81, temperature 98.3 F (36.8 C), temperature source Tympanic, resp. rate 16, weight 111 lb 5.3 oz (50.5 kg), SpO2 99 %. GENERAL: Thin elderly woman sitting comfortably in the exam room in no acute distress. MENTAL STATUS: Alert and oriented to person, place and time. HEAD: Short gray hair. Normocephalic, atraumatic, face symmetric, no Cushingoid features. EYES: Blue eyes. No conjunctivitis or scleral icterus. PSYCH: Appropriate.   No visits with results within 3 Day(s) from this visit. Latest known visit with results is:  Orders Only on 09/26/2014  Component Date Value Ref Range Status  . Fecal Occult Bld 09/25/2014 NEGATIVE  NEGATIVE Final    Assessment:  Dawn Wiley is an 80 y.o. female with stage IIIC right breast cancer status post mastectomy and axillary lymph node dissection on 08/31/2014. 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 Carcinoma involved skeletal muscle and dermal lymphatics. Tumor was less than 0.5 mm from the deep margin. Thirteen of 15 lymph nodes were involved. The largest metastatic focus was 2 cm. Tumor was ER positive (1-10%), PR negative, and HER-2/neu negative. Pathologic stage was T3N3aMx.  PET scan on 09/21/2014 revealed postoperative changes in the right chest with no suspicious findings or residual tumor. Bone scan on 10/11/2014 revealed no evidence of metastatic disease.  Bone density study on 10/02/2014 revealed a T score of -4.7 in the forearm consistent with osteoporosis. T-score was less than 2.5 in the spine or hip. Echocardiogram on 10/02/2014 revealed ejection fraction of 60-65%.  She has iron deficiency anemia. Labs on 09/19/2014 revealed a hematocrit of 29.9, hemoglobin 8.7, MCV 69.7, ferritin 9, and TIBC 616. B12 was low (265) with a prior history of B12 deficiency and need for supplementation (stopped 06/2014). Folate was normal. Her diet is modest. She has never had a colonoscopy. She denies any melena or hematochezia.  Symptomatically, she has lost 30# in the  past 8-9 months. She is weak and fatigued. She is hesitant about chemotherapy given the potential side effects.  Plan: 1. Discuss results of bone scan. 2. Discuss patient's thoughts about chemotherapy. We reviewed side effects associated with Adriamycin and Cytoxan as well as Taxol and Taxotere. As I discussed adriamycin, she shook her head and stated that she did not want strong chemotherapy. She is also very hesitant about taxanes given her baseline neuropathy. She appeared interested in CMF chemotherapy.  We discussed trying chemotherapy and if unacceptable possibly switching to something else. Unfortunately all chemotherapy has side effects. I discussed attending a chemotherapy class. We also discussed port placement for chemotherapy administration. Patient initially was thinking about trying chemotherapy first and  then seeing if she wanted a port. After going back and forth for some time, the patient stated that she was willing to have a port placed.  After readdressing that she also needed radiation, the patient agreed to meet with radiation oncology. Over 25 minutes were spent addressing all of these issues with the patient. 3. Preauthorize CMF (Cytoxan, methotrexate and 5-fluorouracil) chemotherapy. 4. Schedule chemotherapy class 5. Port-A-Cath placement with Dr. Leanora Cover. 6. Radiation oncology consult 7. Encourage follow-up with dentistry in order to pursue oral bisphosphonate for osteoporosis. 8. B12 today and weekly x 3 then monthly. 9. Return to clinic for M.D. assessment, labs (CBC with differential, CMP, magnesium), and cycle #1 CMF chemotherapy.  [name], MD  10/12/2014, 11:57 PM

## 2014-10-12 NOTE — Telephone Encounter (Signed)
Pt given information via [employer] on Cyclophosphamide oral tablets, Methotrexate for Injection, and Fluorouracil for Injection; along with information on implanted port access. Referral to Dr. Rexene Edison has been scheduled for 10/20/14; Dr. Sharlyne Cai is no longer with the practice. Her radiation therapy consult with Dr. Baruch Gouty is scheduled for 10/25/14; as Dr. Baruch Gouty will be on vacation the week of 10/16/14...and chemo class is on 10/20/14.[name] KitchenMarland Kitchen

## 2014-10-13 ENCOUNTER — Other Ambulatory Visit: Payer: Self-pay

## 2014-10-13 ENCOUNTER — Telehealth: Payer: Self-pay | Admitting: Surgery

## 2014-10-13 NOTE — Telephone Encounter (Signed)
Pt feels she needs to be seen today. Fluid build up

## 2014-10-13 NOTE — Telephone Encounter (Signed)
Patient called with complaint of possible fluid build up at her mastectomy site. Directed patient to watch for significant fluid build up, fever, severe pain, and if she has any of the previously mentioned symptoms to go immediately to the ED. She does have a visit with Dr Rexene Edison on 10/19/14, and any collected fluid can be removed at that time. Patient confirmed understanding of directions.

## 2014-10-14 NOTE — Progress Notes (Signed)
[facility] day:  10/05/2014  Chief Complaint: Dawn Wiley is a 80 y.o. female with clinical stage IIIC right breast cancer who is seen for review of interval studies and discussion regarding direction of therapy.  HPI: The patient was last seen in the medical oncology clinic on 05/242016.  At that time, she was seen for initial consultation. She had undergone mastectomy and axillary lymph node dissection. 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 Tumor involved skeletal muscle and dermal lymphatics.  Margins were closed. Thirteen of 15 lymph nodes were positive.  At last visit we discussed the management of breast cancer. We discussed staging studies including a PET scan. We also discussed workup of her anemia. Baseline echocardiogram and bone density study were ordered.  CBC showed a hematocrit of 29.9, hemoglobin 8.7, and MCV 69.7. B12 was low at 265. Folate was normal at 12.3. TSH was normal. Ferritin was low at 9% with an elevated TIBC of 616 consistent with iron deficiency anemia.  CA 27.29 was elevated at 90.2 (0-30 8.6).   PET scan on 09/21/2014 to postoperative changes in the right chest with no suspicious findings or residual tumor. Bone density study on 10/02/2014 revealed a T score of -4.7 in the forearm consistent with osteoporosis. T-scores was less than 2.5 in the spine or hip. Echocardiogram on 10/02/2014 revealed ejection fraction of 60-65 %.  Symptomatically, she denies any concerns. She states that she doesn't eat a lot of red meat, mostly vegetable and beans. She was on B12 from October to March 2016. She has not had any B12 since that time. Regarding her dental health, she states that a tooth broke off at the gum and she needs to see a dentist.  Past Medical History  Diagnosis Date  . Kidney stones   . Hyperlipidemia   . Coronary artery disease     Coronary calcifications  noted on CT scan  . Diabetes mellitus without complication   . Nephrolithiasis   . Hypertension   . Arthritis   . Anemia   . Tachycardia     Dr. Fletcher Anon, cardiologist  . Cancer     breast (right)    Past Surgical History  Procedure Laterality Date  . Temporomandibular joint surgery    . Cataract extraction      bilateral  . Breast surgery      breast biopsy X 3  . Eye surgery      bilateral cataract extraction  . Mastectomy modified radical Right 08/31/2014    Procedure: MASTECTOMY MODIFIED RADICAL;  Surgeon: Molly Maduro, MD;  Location: [facility] ORS;  Service: General;  Laterality: Right;    Family History  Problem Relation Age of Onset  . Peripheral Artery Disease Sister     carotid artery stenosis   . Heart disease Sister   . Diabetes Sister     Social History:  reports that she has never smoked. She has never used smokeless tobacco. She reports that she does not drink alcohol or use illicit drugs.  The patient is accompanied by [place].  Allergies: No Known Allergies  Current Medications: Current Outpatient Prescriptions  Medication Sig Dispense Refill  . acetaminophen (TYLENOL) 500 MG tablet Take 500 mg by mouth every 6 (six) hours as needed for mild pain or moderate pain.     [name] Kitchen amLODipine (NORVASC) 5 MG tablet Take 1 tablet (5 mg total) by mouth daily. 90 tablet 1  . aspirin 81  MG tablet Take 81 mg by mouth every other day.    . diphenhydrAMINE (BENADRYL) 25 mg capsule Take 25 mg by mouth every 6 (six) hours as needed for allergies.    . ferrous sulfate 325 (65 FE) MG tablet Take 325 mg by mouth daily with breakfast.    . hydrocortisone valerate ointment (WESTCORT) 0.2 % Apply 1 application topically 2 (two) times daily. 45 g 0  . metFORMIN (GLUCOPHAGE) 500 MG tablet Take 1 tablet (500 mg total) by mouth 2 (two) times daily with a meal. 180 tablet 1  . metoprolol tartrate (LOPRESSOR) 25 MG tablet Take 1 tablet (25 mg total) by mouth 2 (two) times daily. 180 tablet 1   . ibuprofen (ADVIL,MOTRIN) 400 MG tablet Take 1 tablet (400 mg total) by mouth every 4 (four) hours as needed for mild pain or moderate pain. 30 tablet 0   No current facility-administered medications for this visit.    Review of Systems:  GENERAL: Fatigue. No fevers or sweats. Weight loss of 30 pounds since 01/2014. PERFORMANCE STATUS (ECOG): 1-2 HEENT: Dental issues.  No visual changes, runny nose, sore throat, mouth sores or tenderness. Lungs: Shortness of breath. No cough. No hemoptysis. Cardiac: No chest pain, palpitations, orthopnea, or PND. GI: No nausea, vomiting, diarrhea, constipation, melena or hematochezia. GU: No urgency, frequency, dysuria, or hematuria. Musculoskeletal: No back pain. No joint pain. No muscle tenderness. Extremities: No pain or swelling. Skin: Rash on back x 3-4 weeks. Neuro: No headache, numbness or weakness, balance or coordination issues. Endocrine: No diabetes, thyroid issues, hot flashes or night sweats. Psych: No mood changes, depression or anxiety. Pain: No focal pain. Review of systems: All other systems reviewed and found to be negative.  Physical Exam: Blood pressure 136/74, pulse 84, temperature 97 F (36.1 C), temperature source Tympanic, resp. rate 16, weight 111 lb 12.4 oz (50.7 kg). GENERAL:  Thin elderly woman sitting comfortably in the exam room in no acute distress. MENTAL STATUS:  Alert and oriented to person, place and time. HEAD:  Short gray hair.  Normocephalic, atraumatic, face symmetric, no Cushingoid features. EYES:  Blue eyes.  No conjunctivitis or scleral icterus. PSYCH:  Appropriate.   No visits with results within 3 Day(s) from this visit. Latest known visit with results is:  Orders Only on 09/26/2014  Component Date Value Ref Range Status  . Fecal Occult Bld 09/25/2014 NEGATIVE  NEGATIVE Final    Assessment:  Dawn Wiley is a 80 y.o. female with stage IIIC right breast cancer status post  mastectomy and axillary lymph node dissection on 08/31/2014. 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 Carcinoma involved skeletal muscle and dermal lymphatics. Tumor was less than 0.5 mm from the deep margin. Thirteen of 15 lymph nodes were involved. The largest metastatic focus was 2 cm. Tumor was ER positive (1-10%), PR negative, and HER-2/neu negative. Pathologic stage was T3N3aMx.  PET scan on 09/21/2014 revealed postoperative changes in the right chest with no suspicious findings or residual tumor. Bone density study on 10/02/2014 revealed a T score of -4.7 in the forearm consistent with osteoporosis. T-score was  less than 2.5 in the spine or hip. Echocardiogram on 10/02/2014 revealed ejection fraction of 60-65%.  She has iron deficiency anemia. Labs on 09/19/2014 revealed a hematocrit of 29.9, hemoglobin 8.7, MCV 69.7, ferritin 9, and TIBC 616. B12 was low (265) with a prior history of B12 deficiency and need for supplementation (stopped 06/2014).  Folate was normal.  Her diet is modest. She has never had a colonoscopy. She denies any melena or hematochezia.  Symptomatically, she has lost 30# in the past 8-9 months. She is weak and fatigued. She is taking an iron pill. Exam reveals post-operative changes.  Plan: 1. Discuss studies including PET scan, bone density study and echocardiogram. I discussed that imaging studies at present indicates stage IIIC disease. Discuss aggressive chemotherapy given her aggressive  tumor. Discuss elevated tumor marker which could indicate disease not picked up by PET scan. Given her vague bone complaints, discuss a bone scan. Discuss difference in treatment between adjuvant therapy and metastatic disease. 2. Discuss iron deficiency anemia. Encouraged her to take her iron with OJ or vitamin C. Take oral iron twice daily at tolerated.  Discuss patient's reluctance for undergoing a  colonoscopy. 3. Diiscuss B12 deficiency and need for ongoing supplementation. Patient wishes to be treated in our clinic. Preauthorize. 4. Discuss osteoporosis.  She is to take a calcium 1200 mg a day and vitamin D 800 international units.  Discuss evaluation at dentistry prior to initiation of bisphosphonate.   5. Discuss referral to radiation oncology. Patient wishes to defer 6. Schedule bone scan. 7. RTC after bone scan to finalize treatment plan and begin B12.   [name], MD  10/05/2014, 5:18 PM

## 2014-10-16 ENCOUNTER — Ambulatory Visit: Payer: PPO

## 2014-10-16 ENCOUNTER — Other Ambulatory Visit
Admission: RE | Admit: 2014-10-16 | Discharge: 2014-10-16 | Disposition: A | Discharge status: Home / Self Care | Payer: PPO | Source: Ambulatory Visit | Attending: Surgery | Admitting: Surgery

## 2014-10-16 ENCOUNTER — Telehealth: Payer: Self-pay | Admitting: Surgery

## 2014-10-16 ENCOUNTER — Ambulatory Visit (INDEPENDENT_AMBULATORY_CARE_PROVIDER_SITE_OTHER): Payer: PPO | Admitting: Surgery

## 2014-10-16 ENCOUNTER — Encounter: Payer: Self-pay | Admitting: Surgery

## 2014-10-16 ENCOUNTER — Ambulatory Visit: Payer: PPO | Admitting: Hematology and Oncology

## 2014-10-16 VITALS — BP 117/67 | HR 86 | Temp 98.5°F

## 2014-10-16 DIAGNOSIS — N63 Unspecified lump in breast: Secondary | ICD-10-CM | POA: Insufficient documentation

## 2014-10-16 DIAGNOSIS — T814XXA Infection following a procedure, initial encounter: Secondary | ICD-10-CM

## 2014-10-16 DIAGNOSIS — T8140XA Infection following a procedure, unspecified, initial encounter: Secondary | ICD-10-CM

## 2014-10-16 LAB — CBC WITH DIFFERENTIAL/PLATELET
BASOS ABS: 0 10*3/uL (ref 0–0.1)
Basophils Relative: 0 %
Eosinophils Absolute: 0 10*3/uL (ref 0–0.7)
Eosinophils Relative: 0 %
HEMATOCRIT: 30.2 % — AB (ref 35.0–47.0)
Hemoglobin: 8.8 g/dL — ABNORMAL LOW (ref 12.0–16.0)
Lymphs Abs: 1.3 10*3/uL (ref 1.0–3.6)
MCH: 20.8 pg — ABNORMAL LOW (ref 26.0–34.0)
MCHC: 29.1 g/dL — AB (ref 32.0–36.0)
MCV: 71.5 fL — ABNORMAL LOW (ref 80.0–100.0)
Monocytes Absolute: 1.4 10*3/uL — ABNORMAL HIGH (ref 0.2–0.9)
Monocytes Relative: 11 %
NEUTROS ABS: 9.9 10*3/uL — AB (ref 1.4–6.5)
Platelets: 274 10*3/uL (ref 150–440)
RBC: 4.22 MIL/uL (ref 3.80–5.20)
RDW: 19.7 % — AB (ref 11.5–14.5)
WBC: 12.7 10*3/uL — AB (ref 3.6–11.0)

## 2014-10-16 LAB — BASIC METABOLIC PANEL
Anion gap: 10 (ref 5–15)
BUN: 11 mg/dL (ref 6–20)
CHLORIDE: 101 mmol/L (ref 101–111)
CO2: 26 mmol/L (ref 22–32)
CREATININE: 0.49 mg/dL (ref 0.44–1.00)
Calcium: 9.3 mg/dL (ref 8.9–10.3)
GFR calc Af Amer: >60 mL/min (ref >60)
GFR calc non Af Amer: >60 mL/min (ref >60)
Glucose, Bld: 184 mg/dL — ABNORMAL HIGH (ref 65–99)
Potassium: 3.9 mmol/L (ref 3.5–5.1)
SODIUM: 137 mmol/L (ref 135–145)

## 2014-10-16 NOTE — Patient Instructions (Signed)
Please have your labs completed today.  Next appointment is scheduled with Dr. Rexene Edison for a wound recheck on 10/18/14 at 3pm in [place] office.  Please call with any questions or concerns prior to your appointment- [phone].

## 2014-10-16 NOTE — Progress Notes (Signed)
Pt has been scheduled for [facility] Placement with Dr. Rexene Edison on 10/20/14.

## 2014-10-16 NOTE — Progress Notes (Signed)
CC: Right breast swelling per patient, need for port a cath  HPI: [name] is a pleasant 80 yo F who is s/p MRM of right breast, stage III who presents with 1 week of increased breast swelling, mild tenderness.  She reports that her heart rate has been faster (currently 86) and her BG has been increased.  Drain removed approx 1.5 weeks ago.  No drainage.  No fevers/chills, night sweats, shortness of breath, cough, chest pain, nausea/vomiting, diarrhea/constipation, dysuria/hematuria.  Active Ambulatory Problems    Diagnosis Date Noted  . Tachycardia 03/15/2012  . Dyspnea 03/15/2012  . Hypertension 03/15/2012  . Hyperlipidemia   . Diabetes mellitus without complication   . Preop cardiovascular exam 04/01/2012  . 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 Nephrolithiasis   . Carotid artery bruit 01/13/2013  . Statin intolerance 08/08/2013  . Fatigue 02/12/2014  . Anemia 02/12/2014  . Dermatitis 03/30/2014  . Mass of right breast 08/15/2014  . Breast CA 08/31/2014  . Calculus of kidney 02/19/2012  . Calculi, ureter 02/19/2012  . Diabetes 10/05/2014  . BP (high blood pressure) 10/05/2014  . Hydronephrosis 02/19/2012  . Excessive urination at night 09/08/2012  . OP (osteoporosis) 10/05/2014  . Iron deficiency anemia 10/05/2014  . B12 deficiency 10/05/2014   Resolved Ambulatory Problems    Diagnosis Date Noted  . No Resolved Ambulatory Problems   Past Medical History  Diagnosis Date  . Kidney stones   . Coronary artery disease   . Arthritis   . Cancer    History   Social History  . Marital Status: Widowed    Spouse Name: N/A  . Number of Children: N/A  . Years of Education: N/A   Occupational History  . Not on file.   Social History Main Topics  . Smoking status: Never Smoker   . Smokeless tobacco: Never Used  . Alcohol Use: No  . Drug Use: No  . Sexual Activity: Not on file   Other Topics Concern  . Not on file   Social History Narrative   Current  Outpatient Prescriptions on File Prior to Visit  Medication Sig Dispense Refill  . acetaminophen (TYLENOL) 500 MG tablet Take 500 mg by mouth every 6 (six) hours as needed for mild pain or moderate pain.     [name] Kitchen amLODipine (NORVASC) 5 MG tablet Take 1 tablet (5 mg total) by mouth daily. 90 tablet 1  . aspirin 81 MG tablet Take 81 mg by mouth every other day.    . diphenhydrAMINE (BENADRYL) 25 mg capsule Take 25 mg by mouth every 6 (six) hours as needed for allergies.    . ferrous sulfate 325 (65 FE) MG tablet Take 325 mg by mouth daily with breakfast.    . hydrocortisone valerate ointment (WESTCORT) 0.2 % Apply 1 application topically 2 (two) times daily. 45 g 0  . ibuprofen (ADVIL,MOTRIN) 400 MG tablet Take 1 tablet (400 mg total) by mouth every 4 (four) hours as needed for mild pain or moderate pain. 30 tablet 0  . metFORMIN (GLUCOPHAGE) 500 MG tablet Take 1 tablet (500 mg total) by mouth 2 (two) times daily with a meal. 180 tablet 1  . metoprolol tartrate (LOPRESSOR) 25 MG tablet Take 1 tablet (25 mg total) by mouth 2 (two) times daily. 180 tablet 1   No current facility-administered medications on file prior to visit.   No Known Allergies   ROS: Full review of systems obtained.  Pertinent positives and negatives as above.  Blood pressure 117/67,  pulse 86, temperature 98.5 F (36.9 C), temperature source Oral. GEN: NAD/A&Ox3 FACE: no obvious facial trauma, normal external nose, normal external ears EYES: no scleral icterus, no conjunctivitis HEAD: normocephalic atraumatic CV: RRR, no MRG RESP: moving air well, lungs clear BREAST: mild swelling right breast, nontender, no erythema/induration ABD: soft, nontender, nondistended EXT: moving all ext well, strength 5/5 NEURO: cnII-XII grossly intact, sensation intact all 4 ext  Labs: none Imaging: none  A/P 80 yo F with likely uninfected seroma.  Will put on abx as patient will be placed on OR schedule for port a cath.  Will get labs and  f/u in 2 days to ensure improvement

## 2014-10-16 NOTE — Telephone Encounter (Signed)
Patient came into office at this time and states that her mastectomy incision is red, inflamed, and she is running a fever of 101.5.   Called Dr. Rexene Edison at this time to see if he could come see patient at this time as he is on dayshift.   Will await return phone call.

## 2014-10-16 NOTE — H&P (Signed)
CC: Right breast swelling per patient, need for port a cath  HPI: [name] is a pleasant 80 yo F who is s/p MRM of right breast, stage III who presents with 1 week of increased breast swelling, mild tenderness.  She reports that her heart rate has been faster (currently 86) and her BG has been increased.  Drain removed approx 1.5 weeks ago.  No drainage.  No fevers/chills, night sweats, shortness of breath, cough, chest pain, nausea/vomiting, diarrhea/constipation, dysuria/hematuria.  Active Ambulatory Problems    Diagnosis Date Noted  . Tachycardia 03/15/2012  . Dyspnea 03/15/2012  . Hypertension 03/15/2012  . Hyperlipidemia   . Diabetes mellitus without complication   . Preop cardiovascular exam 04/01/2012  . 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 Nephrolithiasis   . Carotid artery bruit 01/13/2013  . Statin intolerance 08/08/2013  . Fatigue 02/12/2014  . Anemia 02/12/2014  . Dermatitis 03/30/2014  . Mass of right breast 08/15/2014  . Breast CA 08/31/2014  . Calculus of kidney 02/19/2012  . Calculi, ureter 02/19/2012  . Diabetes 10/05/2014  . BP (high blood pressure) 10/05/2014  . Hydronephrosis 02/19/2012  . Excessive urination at night 09/08/2012  . OP (osteoporosis) 10/05/2014  . Iron deficiency anemia 10/05/2014  . B12 deficiency 10/05/2014   Resolved Ambulatory Problems    Diagnosis Date Noted  . No Resolved Ambulatory Problems   Past Medical History  Diagnosis Date  . Kidney stones   . Coronary artery disease   . Arthritis   . Cancer    History   Social History  . Marital Status: Widowed    Spouse Name: N/A  . Number of Children: N/A  . Years of Education: N/A   Occupational History  . Not on file.   Social History Main Topics  . Smoking status: Never Smoker   . Smokeless tobacco: Never Used  . Alcohol Use: No  . Drug Use: No  . Sexual Activity: Not on file   Other Topics Concern  . Not on file   Social History Narrative   Current  Outpatient Prescriptions on File Prior to Visit  Medication Sig Dispense Refill  . acetaminophen (TYLENOL) 500 MG tablet Take 500 mg by mouth every 6 (six) hours as needed for mild pain or moderate pain.     [name] Kitchen amLODipine (NORVASC) 5 MG tablet Take 1 tablet (5 mg total) by mouth daily. 90 tablet 1  . aspirin 81 MG tablet Take 81 mg by mouth every other day.    . diphenhydrAMINE (BENADRYL) 25 mg capsule Take 25 mg by mouth every 6 (six) hours as needed for allergies.    . ferrous sulfate 325 (65 FE) MG tablet Take 325 mg by mouth daily with breakfast.    . hydrocortisone valerate ointment (WESTCORT) 0.2 % Apply 1 application topically 2 (two) times daily. 45 g 0  . ibuprofen (ADVIL,MOTRIN) 400 MG tablet Take 1 tablet (400 mg total) by mouth every 4 (four) hours as needed for mild pain or moderate pain. 30 tablet 0  . metFORMIN (GLUCOPHAGE) 500 MG tablet Take 1 tablet (500 mg total) by mouth 2 (two) times daily with a meal. 180 tablet 1  . metoprolol tartrate (LOPRESSOR) 25 MG tablet Take 1 tablet (25 mg total) by mouth 2 (two) times daily. 180 tablet 1   No current facility-administered medications on file prior to visit.   No Known Allergies   ROS: Full review of systems obtained.  Pertinent positives and negatives as above.  Blood pressure 117/67,  pulse 86, temperature 98.5 F (36.9 C), temperature source Oral. GEN: NAD/A&Ox3 FACE: no obvious facial trauma, normal external nose, normal external ears EYES: no scleral icterus, no conjunctivitis HEAD: normocephalic atraumatic CV: RRR, no MRG RESP: moving air well, lungs clear BREAST: mild swelling right breast, nontender, no erythema/induration ABD: soft, nontender, nondistended EXT: moving all ext well, strength 5/5 NEURO: cnII-XII grossly intact, sensation intact all 4 ext  Labs: none Imaging: none  A/P 80 yo F with likely uninfected seroma.  Will put on abx as patient will be placed on OR schedule for port a cath.  Will get labs and  f/u in 2 days to ensure improvement

## 2014-10-16 NOTE — Telephone Encounter (Signed)
Pt seen in office at this time. Please see office visit.

## 2014-10-16 NOTE — Telephone Encounter (Signed)
Pt talked to Annie Main last week but now has soreness, redness and has a fever of 100.5. She is concerned and doesn't want to wait until her appt on Thursday. Please call

## 2014-10-17 ENCOUNTER — Inpatient Hospital Stay: Payer: PPO

## 2014-10-17 NOTE — Telephone Encounter (Signed)
====  Correction: Pre op time: 12:00 pm.====

## 2014-10-17 NOTE — Telephone Encounter (Signed)
I have called pt to go over appt date for sx and pre care date.  No answer, left msg.  Sx: 10/20/14 with Dr Rexene Edison for [place]. Office pre op: 10/20/14 @ 12:30pm.

## 2014-10-18 ENCOUNTER — Inpatient Hospital Stay: Admission: RE | Admit: 2014-10-18 | Payer: PPO | Source: Ambulatory Visit

## 2014-10-18 ENCOUNTER — Telehealth: Payer: Self-pay

## 2014-10-18 ENCOUNTER — Encounter: Payer: Self-pay | Admitting: Surgery

## 2014-10-18 NOTE — Telephone Encounter (Signed)
Called patient to move appointment from today until tomorrow due to Emergency surgery add-on's at the hospital. Also calling to review labs and get an update on patient condition.  Called Home and Emergency contact numbers. No answer at either number. Left messages for return phone call.

## 2014-10-19 ENCOUNTER — Inpatient Hospital Stay: Admission: RE | Admit: 2014-10-19 | Payer: PPO | Source: Ambulatory Visit

## 2014-10-19 ENCOUNTER — Ambulatory Visit: Payer: PPO | Admitting: Surgery

## 2014-10-19 ENCOUNTER — Ambulatory Visit (INDEPENDENT_AMBULATORY_CARE_PROVIDER_SITE_OTHER): Payer: PPO | Admitting: Surgery

## 2014-10-19 ENCOUNTER — Encounter: Payer: Self-pay | Admitting: Surgery

## 2014-10-19 ENCOUNTER — Encounter: Payer: Self-pay | Admitting: *Deleted

## 2014-10-19 VITALS — BP 132/71 | HR 94 | Temp 98.4°F | Ht 60.0 in | Wt 110.8 lb

## 2014-10-19 DIAGNOSIS — D509 Iron deficiency anemia, unspecified: Secondary | ICD-10-CM | POA: Diagnosis not present

## 2014-10-19 DIAGNOSIS — I10 Essential (primary) hypertension: Secondary | ICD-10-CM | POA: Diagnosis not present

## 2014-10-19 DIAGNOSIS — Z791 Long term (current) use of non-steroidal anti-inflammatories (NSAID): Secondary | ICD-10-CM | POA: Diagnosis not present

## 2014-10-19 DIAGNOSIS — M1990 Unspecified osteoarthritis, unspecified site: Secondary | ICD-10-CM | POA: Diagnosis not present

## 2014-10-19 DIAGNOSIS — T792XXA Traumatic secondary and recurrent hemorrhage and seroma, initial encounter: Secondary | ICD-10-CM

## 2014-10-19 DIAGNOSIS — E119 Type 2 diabetes mellitus without complications: Secondary | ICD-10-CM | POA: Diagnosis not present

## 2014-10-19 DIAGNOSIS — Z7982 Long term (current) use of aspirin: Secondary | ICD-10-CM | POA: Diagnosis not present

## 2014-10-19 DIAGNOSIS — E785 Hyperlipidemia, unspecified: Secondary | ICD-10-CM | POA: Diagnosis not present

## 2014-10-19 DIAGNOSIS — C50912 Malignant neoplasm of unspecified site of left female breast: Secondary | ICD-10-CM | POA: Diagnosis present

## 2014-10-19 DIAGNOSIS — I2510 Atherosclerotic heart disease of native coronary artery without angina pectoris: Secondary | ICD-10-CM | POA: Diagnosis not present

## 2014-10-19 DIAGNOSIS — Z79899 Other long term (current) drug therapy: Secondary | ICD-10-CM | POA: Diagnosis not present

## 2014-10-19 DIAGNOSIS — T888XXA Other specified complications of surgical and medical care, not elsewhere classified, initial encounter: Secondary | ICD-10-CM

## 2014-10-19 MED ORDER — OXYCODONE-ACETAMINOPHEN 5-325 MG PO TABS: 1.0000 | ORAL_TABLET | ORAL | Status: DC | PRN

## 2014-10-19 NOTE — Patient Instructions (Signed)
Your breast may drain slightly tonight from removing fluid today. You may change the dressing if it is soiled.  Continue with surgery as planned tomorrow for your port placement.  You may wash your breast with soap and water daily but do not apply any ointments, creams, etc.  Please keep binder on at all times except when showering for 2 weeks.  If you develop a fever or severe pain, call our office or go straight to ER is after office hours.

## 2014-10-19 NOTE — Patient Instructions (Signed)
  Your procedure is scheduled on:6/24/16Report to Day Surgery. To find out your arrival time please call [phone] between 1PM - 3PM on 10/19/14.  Remember: Instructions that are not followed completely may result in serious medical risk, up to and including death, or upon the discretion of your surgeon and anesthesiologist your surgery may need to be rescheduled.    _x___ 1. Do not eat food or drink liquids after midnight. No gum chewing or hard candies.     __x__ 2. No Alcohol for 24 hours before or after surgery.   ____ 3. Bring all medications with you on the day of surgery if instructed.    _x___ 4. Notify your doctor if there is any change in your medical condition     (cold, fever, infections).     Do not wear jewelry, make-up, hairpins, clips or nail polish.  Do not wear lotions, powders, or perfumes. You may wear deodorant.  Do not shave 48 hours prior to surgery. Men may shave face and neck.  Do not bring valuables to the hospital.    [facility] is not responsible for any belongings or valuables.               Contacts, dentures or bridgework may not be worn into surgery.  Leave your suitcase in the car. After surgery it may be brought to your room.  For patients admitted to the hospital, discharge time is determined by your                treatment team.   Patients discharged the day of surgery will not be allowed to drive home.   Please read over the following fact sheets that you were given:   Surgical Site Infection Prevention   ____ Take these medicines the morning of surgery with A SIP OF WATER:    1.   2.   3.   4.  5.  6.  ____ Fleet Enema (as directed)   ____ Use CHG Soap as directed  ____ Use inhalers on the day of surgery  _x___ Stop metformin 2 days prior to surgery    ____ Take 1/2 of usual insulin dose the night before surgery and none on the morning of surgery.   ____ Stop Coumadin/Plavix/aspirin on  ____ Stop Anti-inflammatories  on   ____ Stop supplements until after surgery.    ____ Bring C-Pap to the hospital.

## 2014-10-20 ENCOUNTER — Ambulatory Visit: Payer: PPO

## 2014-10-20 ENCOUNTER — Encounter
Admission: RE | Disposition: A | Discharge status: Home / Self Care | Payer: Self-pay | Source: Ambulatory Visit | Attending: Surgery

## 2014-10-20 ENCOUNTER — Ambulatory Visit
Admission: RE | Admit: 2014-10-20 | Discharge: 2014-10-20 | Disposition: A | Discharge status: Home / Self Care | Payer: PPO | Source: Ambulatory Visit | Attending: Surgery | Admitting: Surgery

## 2014-10-20 ENCOUNTER — Ambulatory Visit: Payer: PPO | Admitting: Anesthesiology

## 2014-10-20 DIAGNOSIS — Z09 Encounter for follow-up examination after completed treatment for conditions other than malignant neoplasm: Secondary | ICD-10-CM

## 2014-10-20 DIAGNOSIS — E785 Hyperlipidemia, unspecified: Secondary | ICD-10-CM | POA: Insufficient documentation

## 2014-10-20 DIAGNOSIS — C50911 Malignant neoplasm of unspecified site of right female breast: Secondary | ICD-10-CM

## 2014-10-20 DIAGNOSIS — C50912 Malignant neoplasm of unspecified site of left female breast: Secondary | ICD-10-CM | POA: Insufficient documentation

## 2014-10-20 DIAGNOSIS — Z791 Long term (current) use of non-steroidal anti-inflammatories (NSAID): Secondary | ICD-10-CM | POA: Insufficient documentation

## 2014-10-20 DIAGNOSIS — I2510 Atherosclerotic heart disease of native coronary artery without angina pectoris: Secondary | ICD-10-CM | POA: Insufficient documentation

## 2014-10-20 DIAGNOSIS — E119 Type 2 diabetes mellitus without complications: Secondary | ICD-10-CM | POA: Insufficient documentation

## 2014-10-20 DIAGNOSIS — M1990 Unspecified osteoarthritis, unspecified site: Secondary | ICD-10-CM | POA: Insufficient documentation

## 2014-10-20 DIAGNOSIS — Z7982 Long term (current) use of aspirin: Secondary | ICD-10-CM | POA: Insufficient documentation

## 2014-10-20 DIAGNOSIS — D509 Iron deficiency anemia, unspecified: Secondary | ICD-10-CM | POA: Insufficient documentation

## 2014-10-20 DIAGNOSIS — Z79899 Other long term (current) drug therapy: Secondary | ICD-10-CM | POA: Insufficient documentation

## 2014-10-20 DIAGNOSIS — I10 Essential (primary) hypertension: Secondary | ICD-10-CM | POA: Insufficient documentation

## 2014-10-20 HISTORY — DX: Rash and other nonspecific skin eruption: R21

## 2014-10-20 HISTORY — DX: Other specified postprocedural states: Z98.890

## 2014-10-20 HISTORY — DX: Other specified postprocedural states: R11.2

## 2014-10-20 HISTORY — PX: PORTACATH PLACEMENT: SHX2246

## 2014-10-20 LAB — GLUCOSE, CAPILLARY: Glucose-Capillary: 99 mg/dL (ref 65–99)

## 2014-10-20 SURGERY — INSERTION, TUNNELED CENTRAL VENOUS DEVICE, WITH PORT
Anesthesia: General | Site: Chest | Laterality: Left | Wound class: Clean

## 2014-10-20 MED ORDER — HEPARIN SODIUM (PORCINE) 5000 UNIT/ML IJ SOLN
INTRAMUSCULAR | Status: AC
  Filled 2014-10-20: qty 1

## 2014-10-20 MED ORDER — PROPOFOL INFUSION 10 MG/ML OPTIME
INTRAVENOUS | Status: DC | PRN
  Administered 2014-10-20: 50 ug/kg/min via INTRAVENOUS

## 2014-10-20 MED ORDER — LIDOCAINE HCL 1 % IJ SOLN
INTRAMUSCULAR | Status: DC | PRN
  Administered 2014-10-20: 8 mL via INTRAMUSCULAR

## 2014-10-20 MED ORDER — FAMOTIDINE 20 MG PO TABS
20.0000 mg | ORAL_TABLET | Freq: Once | ORAL | Status: AC
  Administered 2014-10-20: 20 mg via ORAL

## 2014-10-20 MED ORDER — FAMOTIDINE 20 MG PO TABS
ORAL_TABLET | ORAL | Status: AC
  Filled 2014-10-20: qty 1

## 2014-10-20 MED ORDER — CEFAZOLIN SODIUM-DEXTROSE 2-3 GM-% IV SOLR
2.0000 g | INTRAVENOUS | Status: AC
  Administered 2014-10-20: 2 g via INTRAVENOUS

## 2014-10-20 MED ORDER — PROPOFOL 10 MG/ML IV BOLUS
INTRAVENOUS | Status: DC | PRN
  Administered 2014-10-20: 30 mg via INTRAVENOUS

## 2014-10-20 MED ORDER — FENTANYL CITRATE (PF) 100 MCG/2ML IJ SOLN
INTRAMUSCULAR | Status: DC | PRN
  Administered 2014-10-20 (×2): 25 ug via INTRAVENOUS
  Administered 2014-10-20: 50 ug via INTRAVENOUS

## 2014-10-20 MED ORDER — OXYCODONE-ACETAMINOPHEN 5-325 MG PO TABS: 1.0000 | ORAL_TABLET | ORAL | Status: DC | PRN

## 2014-10-20 MED ORDER — ONDANSETRON HCL 4 MG/2ML IJ SOLN
INTRAMUSCULAR | Status: DC | PRN
  Administered 2014-10-20: 4 mg via INTRAVENOUS

## 2014-10-20 MED ORDER — SODIUM CHLORIDE 0.9 % IV SOLN
INTRAVENOUS | Status: DC | PRN
  Administered 2014-10-20: 10 mL via INTRAMUSCULAR

## 2014-10-20 MED ORDER — ONDANSETRON HCL 4 MG/2ML IJ SOLN: 4.0000 mg | Freq: Once | INTRAMUSCULAR | Status: DC | PRN

## 2014-10-20 MED ORDER — SODIUM CHLORIDE 0.9 % IJ SOLN
INTRAMUSCULAR | Status: AC
  Filled 2014-10-20: qty 50

## 2014-10-20 MED ORDER — LIDOCAINE HCL (PF) 1 % IJ SOLN
INTRAMUSCULAR | Status: AC
  Filled 2014-10-20: qty 30

## 2014-10-20 MED ORDER — FENTANYL CITRATE (PF) 100 MCG/2ML IJ SOLN: 25.0000 ug | INTRAMUSCULAR | Status: DC | PRN

## 2014-10-20 MED ORDER — BUPIVACAINE HCL (PF) 0.25 % IJ SOLN
INTRAMUSCULAR | Status: AC
  Filled 2014-10-20: qty 30

## 2014-10-20 MED ORDER — SODIUM CHLORIDE 0.9 % IV SOLN
INTRAVENOUS | Status: DC
  Administered 2014-10-20: 09:00:00 via INTRAVENOUS

## 2014-10-20 MED ORDER — MIDAZOLAM HCL 2 MG/2ML IJ SOLN
INTRAMUSCULAR | Status: DC | PRN
  Administered 2014-10-20 (×2): 1 mg via INTRAVENOUS

## 2014-10-20 MED ORDER — CEFAZOLIN SODIUM-DEXTROSE 2-3 GM-% IV SOLR
INTRAVENOUS | Status: AC
  Filled 2014-10-20: qty 50

## 2014-10-20 SURGICAL SUPPLY — 26 items
BANDAGE ELASTIC 6 CLIP ST LF (GAUZE/BANDAGES/DRESSINGS) ×3 IMPLANT
BLADE SURG SZ11 CARB STEEL (BLADE) ×3 IMPLANT
CANISTER SUCT 1200ML W/VALVE (MISCELLANEOUS) ×3 IMPLANT
CHLORAPREP W/TINT 26ML (MISCELLANEOUS) ×3 IMPLANT
COVER LIGHT HANDLE STERIS (MISCELLANEOUS) ×6 IMPLANT
DRAPE C-ARM XRAY 36X54 (DRAPES) ×3 IMPLANT
GLOVE BIO SURGEON STRL SZ7.5 (GLOVE) ×6 IMPLANT
GOWN STRL REUS W/ TWL LRG LVL3 (GOWN DISPOSABLE) ×2 IMPLANT
GOWN STRL REUS W/TWL LRG LVL3 (GOWN DISPOSABLE) ×4
KIT RM TURNOVER STRD PROC AR (KITS) ×3 IMPLANT
LABEL OR SOLS (LABEL) ×3 IMPLANT
LIQUID BAND (GAUZE/BANDAGES/DRESSINGS) ×3 IMPLANT
NEEDLE FILTER BLUNT 18X 1/2SAF (NEEDLE) ×2
NEEDLE FILTER BLUNT 18X1 1/2 (NEEDLE) ×1 IMPLANT
PACK PORT-A-CATH (MISCELLANEOUS) ×3 IMPLANT
PAD ABD DERMACEA PRESS 5X9 (GAUZE/BANDAGES/DRESSINGS) ×3 IMPLANT
PAD GROUND ADULT SPLIT (MISCELLANEOUS) ×3 IMPLANT
PORTACATH POWER 8F (Port) ×3 IMPLANT
SUT MNCRL 4-0 (SUTURE) ×2
SUT MNCRL 4-0 27XMFL (SUTURE) ×1
SUT PROLENE 3 0 SH DA (SUTURE) ×6 IMPLANT
SUT VIC AB 3-0 SH 27 (SUTURE) ×2
SUT VIC AB 3-0 SH 27X BRD (SUTURE) ×1 IMPLANT
SUTURE MNCRL 4-0 27XMF (SUTURE) ×1 IMPLANT
SYR 3ML LL SCALE MARK (SYRINGE) ×3 IMPLANT
SYRINGE 10CC LL (SYRINGE) ×3 IMPLANT

## 2014-10-20 NOTE — Anesthesia Preprocedure Evaluation (Signed)
Anesthesia Evaluation  Patient identified by MRN, date of birth, ID band Patient awake    Reviewed: Allergy & Precautions, NPO status , Patient's Chart, lab work & pertinent test results, reviewed documented beta blocker date and time   History of Anesthesia Complications (+) PONV and history of anesthetic complications  Airway Mallampati: III  TM Distance: >3 FB Neck ROM: Full  Mouth opening: Limited Mouth Opening  Dental no notable dental hx.    Pulmonary neg pulmonary ROS,  breath sounds clear to auscultation  Pulmonary exam normal       Cardiovascular Exercise Tolerance: Poor hypertension, Pt. on medications and Pt. on home beta blockers + CAD negative cardio ROS Normal cardiovascular examRhythm:Regular Rate:Normal     Neuro/Psych negative neurological ROS  negative psych ROS   GI/Hepatic negative GI ROS, Neg liver ROS,   Endo/Other  diabetes, Well Controlled, Type 2, Oral Hypoglycemic Agents  Renal/GU negative Renal ROS  negative genitourinary   Musculoskeletal  (+) Arthritis -, Osteoarthritis,    Abdominal   Peds negative pediatric ROS (+)  Hematology  (+) anemia ,   Anesthesia Other Findings R breast cancer S/p TMJ surgery  Reproductive/Obstetrics negative OB ROS                             Anesthesia Physical Anesthesia Plan  ASA: III  Anesthesia Plan: General   Post-op Pain Management:    Induction: Intravenous  Airway Management Planned: Simple Face Mask  Additional Equipment:   Intra-op Plan:   Post-operative Plan:   Informed Consent: I have reviewed the patients History and Physical, chart, labs and discussed the procedure including the risks, benefits and alternatives for the proposed anesthesia with the patient or authorized representative who has indicated his/her understanding and acceptance.   Dental advisory given  Plan Discussed with: CRNA and  Surgeon  Anesthesia Plan Comments:         Anesthesia Quick Evaluation

## 2014-10-20 NOTE — Addendum Note (Signed)
Addendum  created 10/20/14 1303 by Doreen Salvage, CRNA   Modules edited: Charges VN

## 2014-10-20 NOTE — Op Note (Signed)
Preoperative diagnosis: Breast cancer Postop dx: Breast cancer Procedure performed: Ultrasound guided left internal jugular port a cath placement Anesthesia: Mac/Local Assistant: none EBL: minimal Complications: None  Indication for Surgery:  Mr. Spoerl is a pleasant 80 yo with breast cancer.  I am placing a port a cath for chemotherapy.  Details of procedure.  Informed consent was obtained.  She was brought to the operating room suite and given IV sedation.  Her left neck was prepped and draped.  Time out was then performed identifying patient name, operative site and procedure to be performed.  I then used the ultrasound to access her left IJ.  I then placed the wire down the needle and used fluoro to show that it was correctly going down her vena cava.  I then created a pocket on her left chest and placed two 3-0 Prolene stay sutures to secure the port.  I then tunneled the port a cath catheter to his neck access point using a tunneling device.  I then dilated the tract over the wire and placed the trocar/dilator over the wire under direct fluoroscopic visualization.  I then removed the dilator and wire and placed the catheter to the cavoatrial junction.  I then attached the catheter to the port and secured the port with the previously placed stay sutures.  I then irrigated the pocked and closed the neck incision with a simple interrupted deep dermal 3-0 vicryl.  The port site was then closed in layers with 3-0 vicryl deep dermal interrupted sutures and a 4-0 monocryl running subcuticular suture.  Dermabond was placed over all incisions.  Patient was then awoken, drapes taken down and he was transferred to the PACU.  There were no immediate complications.  Needle, sponge and instrument count was correct at then end of the procedure.  Post op chest xray showed catheter in the correct position without pneumothorax.

## 2014-10-20 NOTE — Progress Notes (Signed)
I have seen and evaluated Dawn Wiley.  No acute changes.  Proceed with port a cath placement

## 2014-10-20 NOTE — Progress Notes (Signed)
Still with right post mastectomy tissue swelling.  Some erythema.  Plan for port tomorrow.  Performed diagnostic and therapeutic aspiration of possible seroma vs abscess.  Procedure as follows.    Informed consent obtained.  Dawn Wiley was laid supine on the office table.  Right breast was prepped and draped.  Informed consent obtained.  Right breast fluctuance infiltrated with 1% lidocaine.  18G needle inserted into fluctuance and obtained approx 250 cc from fluctuance part and by providing pressure to axilla where there is swelling as well.  Fluid was serous.  Dressing placed and ace bandage placed around chest.  No immediate complications.  Plan for port tomorrow.  Will see back to readdress seroma in 2-3 weeks.

## 2014-10-20 NOTE — Discharge Instructions (Signed)
Do not drive on pain medications Do not lift greater than 15 lbs for a period of 6 weeks Call or return to ER if you develop fever greater than 101.5, nausea/vomiting, increased pain, redness/drainage from incisions Okay to shower, no tub baths

## 2014-10-20 NOTE — Anesthesia Postprocedure Evaluation (Signed)
  Anesthesia Post-op Note  Patient: Dawn Wiley  Procedure(s) Performed: Procedure(s): INSERTION PORT-A-CATH (Left)  Anesthesia type:General  Patient location: PACU  Post pain: Pain level controlled  Post assessment: Post-op Vital signs reviewed, Patient's Cardiovascular Status Stable, Respiratory Function Stable, Patent Airway and No signs of Nausea or vomiting  Post vital signs: Reviewed and stable  Last Vitals:  Filed Vitals:   10/20/14 1213  BP: 151/62  Pulse: 76  Temp: 35.8 C  Resp: 16    Level of consciousness: awake, alert  and patient cooperative  Complications: No apparent anesthesia complications

## 2014-10-20 NOTE — Brief Op Note (Signed)
10/20/2014  11:12 AM  PATIENT:  Dawn Wiley  80 y.o. female  PRE-OPERATIVE DIAGNOSIS:  Breast cancer  POST-OPERATIVE DIAGNOSIS:  Breast cancer  PROCEDURE:  Procedure(s): INSERTION PORT-A-CATH (Left)  Left internal jugular portacath placement for treatment of breast cancer  SURGEON:  Surgeon(s) and Role:    * Marlyce Huge, MD - Primary  PHYSICIAN ASSISTANT:   ASSISTANTS: none   ANESTHESIA:   IV sedation  EBL:     BLOOD ADMINISTERED:none  DRAINS: none   LOCAL MEDICATIONS USED:  LIDOCAINE   SPECIMEN:  No Specimen  DISPOSITION OF SPECIMEN:  N/A  COUNTS:  YES  TOURNIQUET:  * No tourniquets in log *  DICTATION: .Note written in EPIC  PLAN OF CARE: Discharge to home after PACU  PATIENT DISPOSITION:  PACU - hemodynamically stable.   Delay start of Pharmacological VTE agent (>24hrs) due to surgical blood loss or risk of bleeding: not applicable

## 2014-10-20 NOTE — Anesthesia Procedure Notes (Signed)
Date/Time: 10/20/2014 9:54 AM Performed by: Doreen Salvage Pre-anesthesia Checklist: Patient identified, Emergency Drugs available, Suction available and Patient being monitored Patient Re-evaluated:Patient Re-evaluated prior to inductionOxygen Delivery Method: Simple face mask

## 2014-10-20 NOTE — Transfer of Care (Signed)
  Immediate Anesthesia Transfer of Care Note  Patient: Dawn Wiley  Procedure(s) Performed: Procedure(s): INSERTION PORT-A-CATH (Left)  Patient Location: PACU  Anesthesia Type:General  Level of Consciousness: sedated  Airway & Oxygen Therapy: Patient Spontanous Breathing and Patient connected to face mask oxygen  Post-op Assessment: Report given to RN and Post -op Vital signs reviewed and stable  Post vital signs: Reviewed and stable  Last Vitals:  Filed Vitals:   10/20/14 1115  BP: 117/72  Pulse: 81  Temp: 36.8 C  Resp: 16    Complications: No apparent anesthesia complications

## 2014-10-23 ENCOUNTER — Encounter: Payer: Self-pay | Admitting: Surgery

## 2014-10-23 ENCOUNTER — Telehealth: Payer: Self-pay | Admitting: *Deleted

## 2014-10-23 ENCOUNTER — Other Ambulatory Visit: Payer: Self-pay

## 2014-10-23 MED ORDER — LIDOCAINE-PRILOCAINE 2.5-2.5 % EX CREA: 1.0000 "application " | TOPICAL_CREAM | CUTANEOUS | Status: DC | PRN

## 2014-10-23 NOTE — Telephone Encounter (Signed)
No Josem Kaufmann is req for CPT: 89169 per [employer] online portal. REF: [id number] per [facility].

## 2014-10-23 NOTE — Telephone Encounter (Signed)
Patient called today and states she would like a prescription for the EMLA cream prior to her first chemo treatment on Friday.  Notified Mallory Shirk, RN, Dr. Kem Parkinson nurse to put in order for prescription.

## 2014-10-25 ENCOUNTER — Encounter: Payer: Self-pay | Admitting: Radiation Oncology

## 2014-10-25 ENCOUNTER — Ambulatory Visit
Admission: RE | Admit: 2014-10-25 | Discharge: 2014-10-25 | Disposition: A | Discharge status: Home / Self Care | Payer: PPO | Source: Ambulatory Visit | Attending: Radiation Oncology | Admitting: Radiation Oncology

## 2014-10-25 VITALS — BP 156/72 | HR 76 | Temp 96.4°F | Resp 18 | Wt 110.5 lb

## 2014-10-25 DIAGNOSIS — C50911 Malignant neoplasm of unspecified site of right female breast: Secondary | ICD-10-CM

## 2014-10-25 NOTE — Consult Note (Signed)
Radiation Oncology NEW PATIENT EVALUATION  Name: Dawn Wiley  MRN: [id number]  Date:   10/25/2014     DOB: [birth year]   This 80 y.o. female patient presents to the clinic for initial evaluation of breast cancer stage IIIc (pathologic stage TIV N3 M0). Borderline ER positive PR negative HER-2/neu negative status post right modified mastectomy and axillary lymph node dissection  REFERRING PHYSICIAN: Crecencio Mc, MD  CHIEF COMPLAINT:  Chief Complaint  Patient presents with  . Breast Cancer    Pt is here for initial evaluation of breast cancer.      DIAGNOSIS: The encounter diagnosis was Malignant neoplasm of female breast, right.   PREVIOUS INVESTIGATIONS:  PET CT scan reviewed Surgical pathology reports reviewed Clinical notes reviewed  HPI: Patient is a 80 year old female who noticed enlargement her right breast over several months. She states she's had 3 previous biopsies which were all negative. She was seen by surgeon presenting with a 14.7 cm in meter tumor status post right modified radical mastectomy with axillary lymph node dissection. 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 Tumor involved the skeletal muscle and dermal lymphatics making it inflammatory carcinoma. Margins were close. 13 of 15 lymph nodes were positive for metastatic disease. Postop PET CT scan showed residual hypermetabolic activity in the right chest wall no suspicious findings for residual disease or distant disease. She's been seen by medical oncology and is planning to start systemic chemotherapy in about a week. She's had a port placed. She's had some fluid under her mastectomy scar which is being drained and managed by surgeon. She seen today for radiation oncology opinion. She states the chest wall is doing well she is in no pain cough.  PLANNED TREATMENT REGIMEN: Systemic chemotherapy plus chest wall and peripheral lymphatic radiation  therapy  PAST MEDICAL HISTORY:  has a past medical history of Kidney stones; Hyperlipidemia; Coronary artery disease; Diabetes mellitus without complication; Nephrolithiasis; Hypertension; Arthritis; Anemia; Tachycardia; Cancer; PONV (postoperative nausea and vomiting); and Rash.    PAST SURGICAL HISTORY:  Past Surgical History  Procedure Laterality Date  . Temporomandibular joint surgery    . Cataract extraction      bilateral  . Breast surgery      breast biopsy X 3  . Eye surgery      bilateral cataract extraction  . Mastectomy modified radical Right 08/31/2014    Procedure: MASTECTOMY MODIFIED RADICAL;  Surgeon: Molly Maduro, MD;  Location: [facility] ORS;  Service: General;  Laterality: Right;  . Portacath placement Left 10/20/2014    Procedure: INSERTION PORT-A-CATH;  Surgeon: Marlyce Huge, MD;  Location: [facility] ORS;  Service: General;  Laterality: Left;    FAMILY HISTORY: family history includes Diabetes in her sister; Heart disease in her sister; Peripheral Artery Disease in her sister.  SOCIAL HISTORY:  reports that she has never smoked. She has never used smokeless tobacco. She reports that she does not drink alcohol or use illicit drugs.  ALLERGIES: Review of patient's allergies indicates no known allergies.  MEDICATIONS:  Current Outpatient Prescriptions  Medication Sig Dispense Refill  . acetaminophen (TYLENOL) 500 MG tablet Take 500 mg by mouth every 6 (six) hours as needed for mild pain or moderate pain.     [name] Kitchen amLODipine (NORVASC) 5 MG tablet Take 1 tablet (5 mg total) by mouth daily. 90 tablet 1  . aspirin 81 MG tablet Take 81 mg by mouth every other day.    . Calcium Carbonate (CALTRATE 600 PO)  Take by mouth.    . cholecalciferol (VITAMIN D) 1000 UNITS tablet Take 1,000 Units by mouth daily.    . diphenhydrAMINE (BENADRYL) 25 mg capsule Take 25 mg by mouth every 6 (six) hours as needed for allergies.    . ferrous sulfate 325 (65 FE) MG tablet Take 325 mg by mouth  daily with breakfast.    . lidocaine-prilocaine (EMLA) cream Apply 1 application topically as needed. Apply 1-2 hours prior to chemotherapy 30 g 1  . metFORMIN (GLUCOPHAGE) 500 MG tablet Take 1 tablet (500 mg total) by mouth 2 (two) times daily with a meal. 180 tablet 1  . metoprolol tartrate (LOPRESSOR) 25 MG tablet Take 1 tablet (25 mg total) by mouth 2 (two) times daily. 180 tablet 1  . oxyCODONE-acetaminophen (PERCOCET/ROXICET) 5-325 MG per tablet Take 1 tablet by mouth every 4 (four) hours as needed for severe pain. 20 tablet 0  . hydrocortisone valerate ointment (WESTCORT) 0.2 % Apply 1 application topically 2 (two) times daily. (Patient not taking: Reported on 10/25/2014) 45 g 0  . ibuprofen (ADVIL,MOTRIN) 400 MG tablet Take 1 tablet (400 mg total) by mouth every 4 (four) hours as needed for mild pain or moderate pain. (Patient not taking: Reported on 10/20/2014) 30 tablet 0   No current facility-administered medications for this encounter.    ECOG PERFORMANCE STATUS:  0 - Asymptomatic  REVIEW OF SYSTEMS:  Patient denies any weight loss, fatigue, weakness, fever, chills or night sweats. Patient denies any loss of vision, blurred vision. Patient denies any ringing  of the ears or hearing loss. No irregular heartbeat. Patient denies heart murmur or history of fainting. Patient denies any chest pain or pain radiating to her upper extremities. Patient denies any shortness of breath, difficulty breathing at night, cough or hemoptysis. Patient denies any swelling in the lower legs. Patient denies any nausea vomiting, vomiting of blood, or coffee ground material in the vomitus. Patient denies any stomach pain. Patient states has had normal bowel movements no significant constipation or diarrhea. Patient denies any dysuria, hematuria or significant nocturia. Patient denies any problems walking, swelling in the joints or loss of balance. Patient denies any skin changes, loss of hair or loss of weight.  Patient denies any excessive worrying or anxiety or significant depression. Patient denies any problems with insomnia. Patient denies excessive thirst, polyuria, polydipsia. Patient denies any swollen glands, patient denies easy bruising or easy bleeding. Patient denies any recent infections, allergies or URI. Patient "s visual fields have not changed significantly in recent time.    PHYSICAL EXAM: BP 156/72 mmHg  Pulse 76  Temp(Src) 96.4 F (35.8 C)  Resp 18  Wt 110 lb 7.2 oz (50.1 kg) Patient is status post right modified radical mastectomy with incision healing well. There are still some erythematous areas of skin on her chest wall. No evidence of mass or nodularity is noted. No axillary or supraclavicular adenopathy is appreciated. No evidence of lymphedema of her right upper extremity is noted. Well-developed well-nourished patient in NAD. HEENT reveals PERLA, EOMI, discs not visualized.  Oral cavity is clear. No oral mucosal lesions are identified. Neck is clear without evidence of cervical or supraclavicular adenopathy. Lungs are clear to A&P. Cardiac examination is essentially unremarkable with regular rate and rhythm without murmur rub or thrill. Abdomen is benign with no organomegaly or masses noted. Motor sensory and DTR levels are equal and symmetric in the upper and lower extremities. Cranial nerves II through XII are grossly intact. Proprioception is  intact. No peripheral adenopathy or edema is identified. No motor or sensory levels are noted. Crude visual fields are within normal range.   LABORATORY DATA:  No results found for this or any previous visit (from the past 72 hour(s)).   RADIOLOGY RESULTS: No results found.  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  PLAN: At this time patient has stage IIIc inflammatory breast cancer status post right modified  radical mastectomy. She scheduled to start systemic chemotherapy which I agree with. She also will be candidate for right chest wall and peripheral lymphatic radiation after completion of chemotherapy. Based on the close margins large tumor size extensive lymph node involvement would plan on delivering 5000 cGy to her right chest wall and peripheral lymphatics boosting her scar another 1600 cGy using electron beam. Risks and benefits of treatment including skin changes, fatigue, inclusion of superficial lung, and possibility of increasing chances of lymphedema of her right upper extremity all were discussed in detail. I've asked her to use her right arm is much is possible to help prevent lymphedema. I will reevaluate the patient after completion of chemotherapy. Patient is to call sooner with any questions.  I would like to take this opportunity for allowing me to participate in the care of your patient.Armstead Peaks., MD

## 2014-10-26 ENCOUNTER — Ambulatory Visit (INDEPENDENT_AMBULATORY_CARE_PROVIDER_SITE_OTHER): Payer: PPO | Admitting: Surgery

## 2014-10-26 ENCOUNTER — Encounter: Payer: Self-pay | Admitting: Surgery

## 2014-10-26 VITALS — BP 151/67 | HR 78 | Temp 98.6°F | Ht 60.0 in | Wt 110.8 lb

## 2014-10-26 DIAGNOSIS — T148 Other injury of unspecified body region: Secondary | ICD-10-CM

## 2014-10-26 DIAGNOSIS — IMO0002 Reserved for concepts with insufficient information to code with codable children: Secondary | ICD-10-CM

## 2014-10-26 NOTE — Patient Instructions (Signed)
Please call our office if you feel like you need advice or need to be seen, we would glad to see you!  You may leave the wrap off and see if the fluid in your breast returns. If you feel like the fluid is coming back or you develop a fever or drainage; Call our office and speak with a nurse.

## 2014-10-26 NOTE — Progress Notes (Signed)
Doing well.  Min pain from port.  Has been wearing dressing on chest to attempt to resolve seroma cavity.  No swelling, no pain.  No acute issues  O: Blood pressure 125/66, pulse 72, temperature 97.9 F (36.6 C), temperature source Oral, height 5\' 7"  (1.702 m), weight 162 lb (Dawn.483 kg). GEN: NAD/A&Ox3 CHEST: port a site c/d/i with dermabond Right mastectomy site without seroma  A/P 80 Wiley s/p right mastectomy with postop seroma s/p aspiration s/p port a cath, doing well - f/u prn - wrap chest during day x 1 week

## 2014-10-27 ENCOUNTER — Encounter: Payer: Self-pay | Admitting: Hematology and Oncology

## 2014-10-27 ENCOUNTER — Inpatient Hospital Stay: Payer: PPO | Attending: Hematology and Oncology

## 2014-10-27 ENCOUNTER — Inpatient Hospital Stay (HOSPITAL_BASED_OUTPATIENT_CLINIC_OR_DEPARTMENT_OTHER): Payer: PPO | Admitting: Hematology and Oncology

## 2014-10-27 ENCOUNTER — Encounter: Payer: Self-pay | Admitting: *Deleted

## 2014-10-27 ENCOUNTER — Inpatient Hospital Stay: Payer: PPO

## 2014-10-27 VITALS — BP 125/69 | HR 71 | Temp 97.7°F | Wt 110.2 lb

## 2014-10-27 DIAGNOSIS — E538 Deficiency of other specified B group vitamins: Secondary | ICD-10-CM | POA: Insufficient documentation

## 2014-10-27 DIAGNOSIS — D509 Iron deficiency anemia, unspecified: Secondary | ICD-10-CM | POA: Diagnosis not present

## 2014-10-27 DIAGNOSIS — Z87442 Personal history of urinary calculi: Secondary | ICD-10-CM

## 2014-10-27 DIAGNOSIS — Z7982 Long term (current) use of aspirin: Secondary | ICD-10-CM

## 2014-10-27 DIAGNOSIS — C50911 Malignant neoplasm of unspecified site of right female breast: Secondary | ICD-10-CM | POA: Insufficient documentation

## 2014-10-27 DIAGNOSIS — I10 Essential (primary) hypertension: Secondary | ICD-10-CM | POA: Insufficient documentation

## 2014-10-27 DIAGNOSIS — E785 Hyperlipidemia, unspecified: Secondary | ICD-10-CM | POA: Insufficient documentation

## 2014-10-27 DIAGNOSIS — I2510 Atherosclerotic heart disease of native coronary artery without angina pectoris: Secondary | ICD-10-CM | POA: Insufficient documentation

## 2014-10-27 DIAGNOSIS — M818 Other osteoporosis without current pathological fracture: Secondary | ICD-10-CM

## 2014-10-27 DIAGNOSIS — Z9011 Acquired absence of right breast and nipple: Secondary | ICD-10-CM | POA: Diagnosis not present

## 2014-10-27 DIAGNOSIS — R634 Abnormal weight loss: Secondary | ICD-10-CM | POA: Insufficient documentation

## 2014-10-27 DIAGNOSIS — E119 Type 2 diabetes mellitus without complications: Secondary | ICD-10-CM

## 2014-10-27 DIAGNOSIS — M129 Arthropathy, unspecified: Secondary | ICD-10-CM | POA: Diagnosis not present

## 2014-10-27 DIAGNOSIS — C779 Secondary and unspecified malignant neoplasm of lymph node, unspecified: Secondary | ICD-10-CM | POA: Insufficient documentation

## 2014-10-27 DIAGNOSIS — Z5111 Encounter for antineoplastic chemotherapy: Secondary | ICD-10-CM | POA: Insufficient documentation

## 2014-10-27 DIAGNOSIS — Z171 Estrogen receptor negative status [ER-]: Secondary | ICD-10-CM

## 2014-10-27 DIAGNOSIS — Z79899 Other long term (current) drug therapy: Secondary | ICD-10-CM | POA: Insufficient documentation

## 2014-10-27 DIAGNOSIS — R000 Tachycardia, unspecified: Secondary | ICD-10-CM | POA: Insufficient documentation

## 2014-10-27 DIAGNOSIS — C50919 Malignant neoplasm of unspecified site of unspecified female breast: Secondary | ICD-10-CM

## 2014-10-27 NOTE — Progress Notes (Signed)
[place] [facility] day:  10/27/2014  Chief Complaint: Dawn Wiley is an 80 y.o. female with stage IIIC right breast cancer who is seen for further discussion regarding direction of therapy.  HPI: The patient was last seen in the medical oncology clinic on 10/12/2014.  At that time, bone scan revealed no evidence of metastatic disease.  We discussed treatment options.  Patient was adamant that she not want to pursue aggressive chemotherapy with side effects.  She was considering no therapy at all.  She was agreeable to CMF chemotherapy.  The patient was discussed at tumor board.  Given her aggressive disease, aggressive treatment was recommended.  Given her near triple negative disease, taxanes and well as platinum agents were discussed.  Repeat estrogen receptor testing was performed.  Tumor was confirmed to be triple negative.  Clinical trials evaluated her for potential clinical trial enrollment.  She was ineligible secondary to her anemia.  Symptomatically, the patient notes no new complaints.  She had her port-a-cath placed on 10/16/2014.  She had fluid drained from her mastectomy site.  She was placed on antibiotics x 1 week.    Past Medical History  Diagnosis Date  . Kidney stones   . Hyperlipidemia   . Coronary artery disease     Coronary calcifications noted on CT scan  . Diabetes mellitus without complication   . Nephrolithiasis   . Hypertension   . Arthritis   . Anemia   . Tachycardia     Dr. Fletcher Anon, cardiologist  . Cancer     breast (right)  . PONV (postoperative nausea and vomiting)   . Rash     back    Past Surgical History  Procedure Laterality Date  . Temporomandibular joint surgery    . Cataract extraction      bilateral  . Breast surgery      breast biopsy X 3  . Eye surgery      bilateral cataract extraction  . Mastectomy modified radical Right 08/31/2014    Procedure: MASTECTOMY MODIFIED RADICAL;  Surgeon: Molly Maduro, MD;  Location: [facility] OR[facility];  Service: General;  Laterality: Right;  . Portacath placement Left 10/20/2014    Procedure: INSERTION PORT-A-CATH;  Surgeon: Marlyce Huge, MD;  Location: [facility] ORS;  Service: General;  Laterality: Left;    Family History  Problem Relation Age of Onset  . Peripheral Artery Disease Sister     carotid artery stenosis   . Heart disease Sister   . Diabetes Sister     Social History:  reports that she has never smoked. She has never used smokeless tobacco. She reports that she does not drink alcohol or use illicit drugs.  The patient is accompanied by her daughter.  Allergies: No Known Allergies  Current Medications: Current Outpatient Prescriptions  Medication Sig Dispense Refill  . acetaminophen (TYLENOL) 500 MG tablet Take 500 mg by mouth every 6 (six) hours as needed for mild pain or moderate pain.     [name] Kitchen amLODipine (NORVASC) 5 MG tablet Take 1 tablet (5 mg total) by mouth daily. 90 tablet 1  . aspirin 81 MG tablet Take 81 mg by mouth every other day.    . Calcium Carbonate (CALTRATE 600 PO) Take by mouth.    . cholecalciferol (VITAMIN D) 1000 UNITS tablet Take 1,000 Units by mouth daily.    . diphenhydrAMINE (BENADRYL) 25 mg capsule Take 25 mg by mouth every 6 (six) hours as needed for allergies.    [name] Kitchen  ferrous sulfate 325 (65 FE) MG tablet Take 325 mg by mouth daily with breakfast.    . hydrocortisone valerate ointment (WESTCORT) 0.2 % Apply 1 application topically 2 (two) times daily. (Patient not taking: Reported on 10/25/2014) 45 g 0  . ibuprofen (ADVIL,MOTRIN) 400 MG tablet Take 1 tablet (400 mg total) by mouth every 4 (four) hours as needed for mild pain or moderate pain. (Patient not taking: Reported on 10/20/2014) 30 tablet 0  . lidocaine-prilocaine (EMLA) cream Apply 1 application topically as needed. Apply 1-2 hours prior to chemotherapy 30 g 1  . metFORMIN (GLUCOPHAGE) 500 MG tablet Take 1 tablet (500 mg total) by mouth 2 (two) times daily  with a meal. 180 tablet 1  . metoprolol tartrate (LOPRESSOR) 25 MG tablet Take 1 tablet (25 mg total) by mouth 2 (two) times daily. 180 tablet 1  . oxyCODONE-acetaminophen (PERCOCET/ROXICET) 5-325 MG per tablet Take 1 tablet by mouth every 4 (four) hours as needed for severe pain. 20 tablet 0   No current facility-administered medications for this visit.    Review of Systems:  GENERAL: Feels the same. No fevers or sweats. Weight loss of 30 pounds since 01/2014. PERFORMANCE STATUS (ECOG): 1-2 HEENT: Dental issues. No visual changes, runny nose, sore throat, mouth sores or tenderness. Lungs: Shortness of breath. No cough. No hemoptysis. Cardiac: No chest pain, palpitations, orthopnea, or PND. GI: No nausea, vomiting, diarrhea, constipation, melena or hematochezia. GU: No urgency, frequency, dysuria, or hematuria. Musculoskeletal: No back pain. No joint pain. No muscle tenderness. Extremities: No pain or swelling. Skin: Fluid taken off mastectomy site (culture negative). Neuro: Toes stay cold and burn a little (neuropathy).  No headache, numbness or weakness, balance or coordination issues. Endocrine: No diabetes, thyroid issues, hot flashes or night sweats. Psych: No mood changes, depression or anxiety. Pain: No focal pain. Review of systems: All other systems reviewed and found to be negative.  Physical Exam: There were no vitals taken for this visit. GENERAL: Thin elderly woman sitting comfortably in the exam room in no acute distress. MENTAL STATUS: Alert and oriented to person, place and time. HEAD: Short gray hair. Normocephalic, atraumatic, face symmetric, no Cushingoid features. EYES: Blue eyes. No conjunctivitis or scleral icterus. PSYCH: Appropriate.   No visits with results within 3 Day(s) from this visit. Latest known visit with results is:  Admission on 10/20/2014, Discharged on 10/20/2014  Component Date Value Ref Range Status  .  Glucose-Capillary 10/20/2014 99  65 - 99 mg/dL Final    Assessment:  Dawn Wiley is an 81 y.o. female with stage IIIC right breast cancer status post mastectomy and axillary lymph node dissection on 08/31/2014. 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 Carcinoma involved skeletal muscle and dermal lymphatics. Tumor was less than 0.5 mm from the deep margin. Thirteen of 15 lymph nodes were involved. The largest metastatic focus was 2 cm. Tumor is triple negative (ER negative, PR negative, and HER-2/neu negative). Pathologic stage was [id number].  PET scan on 09/21/2014 revealed postoperative changes in the right chest with no suspicious findings or residual tumor. Bone scan on 10/11/2014 revealed no evidence of metastatic disease.  Bone density study on 10/02/2014 revealed a T score of -4.7 in the forearm consistent with osteoporosis. T-score was less than 2.5 in the spine or hip. Echocardiogram on 10/02/2014 revealed ejection fraction of 60-65%.  She has iron deficiency anemia. Labs on 09/19/2014 revealed a hematocrit of 29.9, hemoglobin 8.7, MCV 69.7, ferritin 9,  and TIBC 616. B12 was low (265) with a prior history of B12 deficiency and need for supplementation (stopped 06/2014). Folate was normal. Her diet is modest. She has never had a colonoscopy. She denies any melena or hematochezia.  Symptomatically, she has lost 30# in the past 8-9 months. She is weak and fatigued. She is hesitant about chemotherapy given the potential side effects.  Plan: 1. Discuss tumor board discussions. 2. Discuss repeat estrogen receptor testing (negative). 3. Discuss patient's thoughts about chemotherapy. Discuss consideration of weekly Taxol x 12 followed by weekly adriamycin (if tolerated) or carboplatin.  Side effects reviewed in detail. 4. Discuss radiation therapy after completion of chemotherapy. 5. Discuss no hormonal therapy after  radiation therapy (triple negative). 6. Encourage patient to follow-up with dentistry and call clinic when able to pursue oral bisphosphonate for osteoporosis. 7. Continue B12 weekly x 4 (total) then monthly. 8. Return to clinic for M.D. assessment, labs (CBC with differential, CMP, magnesium), and week #1 Taxol.    [name], MD  10/27/2014, 9:56 AM

## 2014-10-27 NOTE — Progress Notes (Signed)
  Oncology Nurse Navigator Documentation  Oncology Nurse Navigator Flowsheets 10/27/2014  Referral date to RadOnc/MedOnc 10/27/2014  Patient Visit Type Follow-up  Treatment Phase First Chemo Tx  Time Spent with Patient 30    Met patient and her daughter today prior to her first chemo treatment.  Offered support and answered questions.  No needs at this time.

## 2014-11-01 ENCOUNTER — Ambulatory Visit (INDEPENDENT_AMBULATORY_CARE_PROVIDER_SITE_OTHER): Payer: PPO | Admitting: Internal Medicine

## 2014-11-01 ENCOUNTER — Encounter: Payer: Self-pay | Admitting: Internal Medicine

## 2014-11-01 VITALS — BP 135/69 | HR 73 | Temp 98.2°F | Ht 60.5 in | Wt 112.0 lb

## 2014-11-01 DIAGNOSIS — Z79899 Other long term (current) drug therapy: Secondary | ICD-10-CM | POA: Diagnosis not present

## 2014-11-01 DIAGNOSIS — C50911 Malignant neoplasm of unspecified site of right female breast: Secondary | ICD-10-CM | POA: Diagnosis not present

## 2014-11-01 DIAGNOSIS — I10 Essential (primary) hypertension: Secondary | ICD-10-CM

## 2014-11-01 DIAGNOSIS — D509 Iron deficiency anemia, unspecified: Secondary | ICD-10-CM

## 2014-11-01 DIAGNOSIS — E119 Type 2 diabetes mellitus without complications: Secondary | ICD-10-CM | POA: Diagnosis not present

## 2014-11-01 NOTE — Progress Notes (Signed)
Pre visit review using our clinic review tool, if applicable. No additional management support is needed unless otherwise documented below in the visit note. 

## 2014-11-01 NOTE — Patient Instructions (Signed)
You are doing very well.  I have entered the labs that are needed.  Please discuss the iron supplements with your oncologist and  See if she can use an iron infusion instead of the tablets since you are not tolerating it.

## 2014-11-01 NOTE — Progress Notes (Signed)
Subjective:  Patient ID: Dawn Wiley, female    DOB: Sep 16, 1934  Age: 80 y.o. MRN: [id number]  CC: The primary encounter diagnosis was Long-term use of high-risk medication. Diagnoses of Breast cancer, stage 3, right, Diabetes mellitus without complication, Iron deficiency anemia, and Essential hypertension were also pertinent to this visit.  HPI Dawn Wiley presents for follow up on multiple issues.  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  The tumor was ER positive,  Pr and HER 2/neu negative. , done several weeks ago, all tubes removed, followed by placement of a port on her left anterior chest wall last month..  She states that she is doing very well, denies pain.  She has had a moderate amount of lymph fluid accumualation at the dependent areas of her surgical site which  has been drained once.  Wants to have cancer center draw labs from port .   She was found to be anemic and is receiving B12 IM injections and iron supplelementation .  Her energy level has  improved and she notes resolution of previously experienced weak spells,  And plans to discontinue the iron pill due to soft stools since they  improved when she stopped the iron and calcium so she has resumed calcium and Vit D only.    She wants to have her other breast removed but has been deferred this option for now,  There has been no spread of cancer , miraculously, to her bones or left breast. She has not yet decided on chemotherapy and has  deferred radiation.   Outpatient Prescriptions Prior to Visit  Medication Sig Dispense Refill  . acetaminophen (TYLENOL) 500 MG tablet Take 500 mg by mouth every 6 (six) hours as needed for mild pain or moderate pain.     [name] Kitchen amLODipine  (NORVASC) 5 MG tablet Take 1 tablet (5 mg total) by mouth daily. 90 tablet 1  . aspirin 81 MG tablet Take 81 mg by mouth every other day.    . Calcium Carbonate (CALTRATE 600 PO) Take 1,200 mg by mouth 2 (two) times daily.     . cholecalciferol (VITAMIN D) 1000 UNITS tablet Take 800 Units by mouth 2 (two) times daily.     . diphenhydrAMINE (BENADRYL) 25 mg capsule Take 25 mg by mouth every 6 (six) hours as needed for allergies.    . ferrous sulfate 325 (65 FE) MG tablet Take 650 mg by mouth daily with breakfast.     . ibuprofen (ADVIL,MOTRIN) 400 MG tablet Take 1 tablet (400 mg total) by mouth every 4 (four) hours as needed for mild pain or moderate pain. 30 tablet 0  . lidocaine-prilocaine (EMLA) cream Apply 1 application topically as needed. Apply 1-2 hours prior to chemotherapy 30 g 1  . metFORMIN (GLUCOPHAGE) 500 MG tablet Take 1 tablet (500 mg total) by mouth 2 (two) times daily with a meal. 180 tablet 1  . metoprolol tartrate (LOPRESSOR) 25 MG tablet Take 1 tablet (25 mg total) by mouth 2 (two) times daily. 180 tablet 1  . oxyCODONE-acetaminophen (PERCOCET/ROXICET) 5-325 MG per tablet Take 1 tablet by mouth every 4 (four) hours as needed for severe pain. 20 tablet 0  . hydrocortisone valerate ointment (WESTCORT) 0.2 % Apply 1 application topically 2 (two) times  daily. 45 g 0   No facility-administered medications prior to visit.    Review of Systems;  Patient denies headache, fevers, malaise, unintentional weight loss, skin rash, eye pain, sinus congestion and sinus pain, sore throat, dysphagia,  hemoptysis , cough, dyspnea, wheezing, chest pain, palpitations, orthopnea, edema, abdominal pain, nausea, melena, diarrhea, constipation, flank pain, dysuria, hematuria, urinary  Frequency, nocturia, numbness, tingling, seizures,  Focal weakness, Loss of consciousness,  Tremor, insomnia, depression, anxiety, and suicidal ideation.      Objective:  BP 135/69 mmHg  Pulse 73  Temp(Src) 98.2 F  (36.8 C) (Oral)  Ht 5' 0.5" (1.537 m)  Wt 112 lb (50.803 kg)  BMI 21.51 kg/m2  SpO2 99%  BP Readings from Last 3 Encounters:  11/03/14 150/76  11/02/14 166/72  11/01/14 135/69    Wt Readings from Last 3 Encounters:  11/02/14 111 lb 12.4 oz (50.7 kg)  11/01/14 112 lb (50.803 kg)  10/27/14 110 lb 3.7 oz (50 kg)    General appearance: alert, cooperative and appears stated age Ears: normal TM's and external ear canals both ears Throat: lips, mucosa, and tongue normal; teeth and gums normal Neck: no adenopathy, no carotid bruit, supple, symmetrical, trachea midline and thyroid not enlarged, symmetric, no tenderness/mass/nodules Back: symmetric, no curvature. ROM normal. No CVA tenderness. Lungs: clear to auscultation bilaterally Heart: regular rate and rhythm, S1, S2 normal, no murmur, click, rub or gallop Abdomen: soft, non-tender; bowel sounds normal; no masses,  no organomegaly Pulses: 2+ and symmetric Skin: Skin color, texture, turgor normal. No rashes or lesions Lymph nodes: Cervical, supraclavicular, and axillary nodes normal.  Lab Results  Component Value Date   HGBA1C 6.3* 11/02/2014   HGBA1C 6.7* 08/14/2014   HGBA1C 6.8* 02/09/2014    Lab Results  Component Value Date   CREATININE 0.53 11/02/2014   CREATININE 0.49 10/16/2014   CREATININE 0.55 09/19/2014    Lab Results  Component Value Date   WBC 5.2 11/02/2014   HGB 8.3* 11/02/2014   HCT 28.2* 11/02/2014   PLT 400 11/02/2014   GLUCOSE 105* 11/02/2014   CHOL 173 08/14/2014   TRIG 82.0 08/14/2014   HDL 68.10 08/14/2014   LDLCALC 89 08/14/2014   ALT 9* 11/02/2014   AST 15 11/02/2014   NA 134* 11/02/2014   K 3.8 11/02/2014   CL 105 11/02/2014   CREATININE 0.53 11/02/2014   BUN 17 11/02/2014   CO2 24 11/02/2014   TSH 1.943 09/19/2014   INR 0.9 08/24/2014   HGBA1C 6.3* 11/02/2014   MICROALBUR 0.9 08/14/2014    No results found.  Assessment & Plan:   Problem List Items Addressed This Visit       Unprioritized   Hypertension    Well controlled on current regimen. Renal function is due, no changes today.   Lab Results  Component Value Date   CREATININE 0.53 11/02/2014   Lab Results  Component Value Date   NA 134* 11/02/2014   K 3.8 11/02/2014   CL 105 11/02/2014   CO2 24 11/02/2014   Lab Results  Component Value Date   MICROALBUR 0.9 08/14/2014           Diabetes mellitus without complication    Well-controlled on current medications.  hemoglobin A1c has been consistently less than 7.0 . She is  NOT up-to-date on eye exams .  Her  foot exam is normal. No history of proteinuria, so amlodipine has been her BP of choice,  Discussed changing to ACE Inhibitor but she  is resistant to change. l Lab Results  Component Value Date   HGBA1C 6.3* 11/02/2014   Lab Results  Component Value Date   MICROALBUR 0.9 08/14/2014               Breast cancer, stage 3    Involving the right breast, skeletal muscle and dermal lymphatics.  Stage IIIC  Micropapillary,  ER positive. Managed with mastectoy and axillar LND, awaiting decision on chemotherapy.        Iron deficiency anemia    Advised to discuss iron infusions with oncologist given her apparent intolerance to oral iron        Other Visit Diagnoses    Long-term use of high-risk medication    -  Primary    Relevant Orders    CBC with Differential/Platelet    Comprehensive metabolic panel    Hemoglobin A1c       I have discontinued Ms. Lords's hydrocortisone valerate ointment. I am also having her maintain her acetaminophen, ferrous sulfate, amLODipine, metFORMIN, metoprolol tartrate, ibuprofen, aspirin, diphenhydrAMINE, cholecalciferol, Calcium Carbonate (CALTRATE 600 PO), oxyCODONE-acetaminophen, and lidocaine-prilocaine.  No orders of the defined types were placed in this encounter.    Medications Discontinued During This Encounter  Medication Reason  . hydrocortisone valerate ointment (WESTCORT) 0.2 %  Patient Preference   A total of 40 minutes was spent with patient more than half of which was spent in counseling patient on the above mentioned issues , reviewing and explaining recent labs and imaging studies done, and coordination of care.  Follow-up: No Follow-up on file.   Crecencio Mc, MD

## 2014-11-02 ENCOUNTER — Inpatient Hospital Stay: Payer: PPO

## 2014-11-02 ENCOUNTER — Other Ambulatory Visit: Payer: Self-pay

## 2014-11-02 ENCOUNTER — Inpatient Hospital Stay (HOSPITAL_BASED_OUTPATIENT_CLINIC_OR_DEPARTMENT_OTHER): Payer: PPO | Admitting: Hematology and Oncology

## 2014-11-02 DIAGNOSIS — C50919 Malignant neoplasm of unspecified site of unspecified female breast: Secondary | ICD-10-CM

## 2014-11-02 DIAGNOSIS — R000 Tachycardia, unspecified: Secondary | ICD-10-CM

## 2014-11-02 DIAGNOSIS — E785 Hyperlipidemia, unspecified: Secondary | ICD-10-CM

## 2014-11-02 DIAGNOSIS — I10 Essential (primary) hypertension: Secondary | ICD-10-CM

## 2014-11-02 DIAGNOSIS — M818 Other osteoporosis without current pathological fracture: Secondary | ICD-10-CM

## 2014-11-02 DIAGNOSIS — Z5111 Encounter for antineoplastic chemotherapy: Secondary | ICD-10-CM | POA: Diagnosis not present

## 2014-11-02 DIAGNOSIS — C779 Secondary and unspecified malignant neoplasm of lymph node, unspecified: Secondary | ICD-10-CM

## 2014-11-02 DIAGNOSIS — Z79899 Other long term (current) drug therapy: Secondary | ICD-10-CM

## 2014-11-02 DIAGNOSIS — C50911 Malignant neoplasm of unspecified site of right female breast: Secondary | ICD-10-CM

## 2014-11-02 DIAGNOSIS — E119 Type 2 diabetes mellitus without complications: Secondary | ICD-10-CM

## 2014-11-02 DIAGNOSIS — Z171 Estrogen receptor negative status [ER-]: Secondary | ICD-10-CM | POA: Diagnosis not present

## 2014-11-02 DIAGNOSIS — I2510 Atherosclerotic heart disease of native coronary artery without angina pectoris: Secondary | ICD-10-CM

## 2014-11-02 DIAGNOSIS — R634 Abnormal weight loss: Secondary | ICD-10-CM

## 2014-11-02 DIAGNOSIS — Z9011 Acquired absence of right breast and nipple: Secondary | ICD-10-CM

## 2014-11-02 DIAGNOSIS — Z7982 Long term (current) use of aspirin: Secondary | ICD-10-CM

## 2014-11-02 DIAGNOSIS — D509 Iron deficiency anemia, unspecified: Secondary | ICD-10-CM | POA: Diagnosis not present

## 2014-11-02 DIAGNOSIS — E538 Deficiency of other specified B group vitamins: Secondary | ICD-10-CM

## 2014-11-02 DIAGNOSIS — Z87442 Personal history of urinary calculi: Secondary | ICD-10-CM

## 2014-11-02 DIAGNOSIS — M129 Arthropathy, unspecified: Secondary | ICD-10-CM

## 2014-11-02 LAB — COMPREHENSIVE METABOLIC PANEL
ALT: 9 U/L — ABNORMAL LOW (ref 14–54)
AST: 15 U/L (ref 15–41)
Albumin: 3.8 g/dL (ref 3.5–5.0)
Alkaline Phosphatase: 61 U/L (ref 38–126)
Anion gap: 5 (ref 5–15)
BUN: 17 mg/dL (ref 6–20)
CO2: 24 mmol/L (ref 22–32)
Calcium: 8.4 mg/dL — ABNORMAL LOW (ref 8.9–10.3)
Chloride: 105 mmol/L (ref 101–111)
Creatinine, Ser: 0.53 mg/dL (ref 0.44–1.00)
GFR calc Af Amer: >60 mL/min (ref >60)
GFR calc non Af Amer: >60 mL/min (ref >60)
Glucose, Bld: 105 mg/dL — ABNORMAL HIGH (ref 65–99)
Potassium: 3.8 mmol/L (ref 3.5–5.1)
Sodium: 134 mmol/L — ABNORMAL LOW (ref 135–145)
Total Bilirubin: 0.5 mg/dL (ref 0.3–1.2)
Total Protein: 7.1 g/dL (ref 6.5–8.1)

## 2014-11-02 LAB — CBC WITH DIFFERENTIAL/PLATELET
Basophils Absolute: 0.1 10*3/uL (ref 0–0.1)
Basophils Relative: 2 %
Eosinophils Absolute: 0.1 10*3/uL (ref 0–0.7)
Eosinophils Relative: 3 %
HCT: 28.2 % — ABNORMAL LOW (ref 35.0–47.0)
Hemoglobin: 8.3 g/dL — ABNORMAL LOW (ref 12.0–16.0)
Lymphocytes Relative: 23 %
Lymphs Abs: 1.2 10*3/uL (ref 1.0–3.6)
MCH: 21.2 pg — ABNORMAL LOW (ref 26.0–34.0)
MCHC: 29.4 g/dL — ABNORMAL LOW (ref 32.0–36.0)
MCV: 72.2 fL — ABNORMAL LOW (ref 80.0–100.0)
Monocytes Absolute: 0.5 10*3/uL (ref 0.2–0.9)
Monocytes Relative: 9 %
Neutro Abs: 3.3 10*3/uL (ref 1.4–6.5)
Neutrophils Relative %: 63 %
Platelets: 400 10*3/uL (ref 150–440)
RBC: 3.9 MIL/uL (ref 3.80–5.20)
RDW: 20.1 % — ABNORMAL HIGH (ref 11.5–14.5)
WBC: 5.2 10*3/uL (ref 3.6–11.0)

## 2014-11-02 NOTE — Progress Notes (Signed)
Pt here today for Taxol; DR. Mike Gip decided she wanted to see patient to discuss prior to TAxol infusion and review of labs

## 2014-11-03 ENCOUNTER — Other Ambulatory Visit: Payer: PPO

## 2014-11-03 ENCOUNTER — Inpatient Hospital Stay: Payer: PPO

## 2014-11-03 ENCOUNTER — Telehealth: Payer: Self-pay

## 2014-11-03 ENCOUNTER — Ambulatory Visit: Payer: PPO

## 2014-11-03 ENCOUNTER — Encounter: Payer: Self-pay | Admitting: *Deleted

## 2014-11-03 VITALS — BP 150/76 | HR 73 | Temp 96.5°F

## 2014-11-03 DIAGNOSIS — C50911 Malignant neoplasm of unspecified site of right female breast: Secondary | ICD-10-CM

## 2014-11-03 DIAGNOSIS — Z5111 Encounter for antineoplastic chemotherapy: Secondary | ICD-10-CM | POA: Diagnosis not present

## 2014-11-03 DIAGNOSIS — E538 Deficiency of other specified B group vitamins: Secondary | ICD-10-CM

## 2014-11-03 LAB — HEMOGLOBIN A1C: Hgb A1c MFr Bld: 6.3 % — ABNORMAL HIGH (ref 4.0–6.0)

## 2014-11-03 MED ORDER — HEPARIN SOD (PORK) LOCK FLUSH 100 UNIT/ML IV SOLN
500.0000 [IU] | Freq: Once | INTRAVENOUS | Status: AC | PRN
  Administered 2014-11-03: 500 [IU]
  Filled 2014-11-03: qty 5

## 2014-11-03 MED ORDER — SODIUM CHLORIDE 0.9 % IV SOLN
Freq: Once | INTRAVENOUS | Status: AC
  Administered 2014-11-03: 10:00:00 via INTRAVENOUS
  Filled 2014-11-03: qty 4

## 2014-11-03 MED ORDER — SODIUM CHLORIDE 0.9 % IV SOLN
Freq: Once | INTRAVENOUS | Status: AC
  Administered 2014-11-03: 10:00:00 via INTRAVENOUS
  Filled 2014-11-03: qty 1000

## 2014-11-03 MED ORDER — ONDANSETRON HCL 8 MG PO TABS: 8.0000 mg | ORAL_TABLET | Freq: Three times a day (TID) | ORAL | Status: DC | PRN

## 2014-11-03 MED ORDER — DIPHENHYDRAMINE HCL 50 MG/ML IJ SOLN
50.0000 mg | Freq: Once | INTRAMUSCULAR | Status: AC
  Administered 2014-11-03: 50 mg via INTRAVENOUS
  Filled 2014-11-03: qty 1

## 2014-11-03 MED ORDER — SODIUM CHLORIDE 0.9 % IJ SOLN
10.0000 mL | INTRAMUSCULAR | Status: DC | PRN
  Filled 2014-11-03: qty 10

## 2014-11-03 MED ORDER — FAMOTIDINE IN NACL 20-0.9 MG/50ML-% IV SOLN
20.0000 mg | Freq: Once | INTRAVENOUS | Status: AC
  Administered 2014-11-03: 20 mg via INTRAVENOUS
  Filled 2014-11-03: qty 50

## 2014-11-03 MED ORDER — PACLITAXEL CHEMO INJECTION 300 MG/50ML
80.0000 mg/m2 | Freq: Once | INTRAVENOUS | Status: AC
  Administered 2014-11-03: 114 mg via INTRAVENOUS
  Filled 2014-11-03: qty 19

## 2014-11-03 MED ORDER — CYANOCOBALAMIN 1000 MCG/ML IJ SOLN
1000.0000 ug | Freq: Once | INTRAMUSCULAR | Status: AC
Start: 2014-11-03 — End: 2014-11-03
  Administered 2014-11-03: 1000 ug via INTRAMUSCULAR
  Filled 2014-11-03: qty 1

## 2014-11-03 NOTE — Assessment & Plan Note (Signed)
Well controlled on current regimen. Renal function is due, no changes today.   Lab Results  Component Value Date   CREATININE 0.53 11/02/2014   Lab Results  Component Value Date   NA 134* 11/02/2014   K 3.8 11/02/2014   CL 105 11/02/2014   CO2 24 11/02/2014   Lab Results  Component Value Date   MICROALBUR 0.9 08/14/2014

## 2014-11-03 NOTE — Telephone Encounter (Signed)
Spoke with Lonn Georgia at [employer] [phone] about prior authorization request for  Ondansetron HCL 8 mg tablets; she stated they would initiate the process and send [place] authorization within 72 hours

## 2014-11-03 NOTE — Assessment & Plan Note (Signed)
Involving the right breast, skeletal muscle and dermal lymphatics.  Stage IIIC  Micropapillary,  ER positive. Managed with mastectoy and axillar LND, awaiting decision on chemotherapy.

## 2014-11-03 NOTE — Assessment & Plan Note (Signed)
Advised to discuss iron infusions with oncologist given her apparent intolerance to oral iron

## 2014-11-03 NOTE — Assessment & Plan Note (Signed)
Well-controlled on current medications.  hemoglobin A1c has been consistently less than 7.0 . She is  NOT up-to-date on eye exams .  Her  foot exam is normal. No history of proteinuria, so amlodipine has been her BP of choice,  Discussed changing to ACE Inhibitor but she is resistant to change. l Lab Results  Component Value Date   HGBA1C 6.3* 11/02/2014   Lab Results  Component Value Date   MICROALBUR 0.9 08/14/2014

## 2014-11-04 ENCOUNTER — Other Ambulatory Visit: Payer: Self-pay | Admitting: Family Medicine

## 2014-11-04 MED ORDER — DOXYCYCLINE HYCLATE 100 MG PO TBEC: 100.0000 mg | DELAYED_RELEASE_TABLET | Freq: Two times a day (BID) | ORAL | Status: DC

## 2014-11-07 ENCOUNTER — Inpatient Hospital Stay (HOSPITAL_BASED_OUTPATIENT_CLINIC_OR_DEPARTMENT_OTHER): Payer: PPO | Admitting: Hematology and Oncology

## 2014-11-07 ENCOUNTER — Ambulatory Visit: Payer: PPO

## 2014-11-07 ENCOUNTER — Other Ambulatory Visit: Payer: PPO

## 2014-11-07 VITALS — BP 155/75 | HR 76 | Temp 95.5°F | Ht 60.0 in | Wt 109.6 lb

## 2014-11-07 DIAGNOSIS — Z79899 Other long term (current) drug therapy: Secondary | ICD-10-CM

## 2014-11-07 DIAGNOSIS — I2510 Atherosclerotic heart disease of native coronary artery without angina pectoris: Secondary | ICD-10-CM

## 2014-11-07 DIAGNOSIS — E119 Type 2 diabetes mellitus without complications: Secondary | ICD-10-CM

## 2014-11-07 DIAGNOSIS — Z9011 Acquired absence of right breast and nipple: Secondary | ICD-10-CM

## 2014-11-07 DIAGNOSIS — C50911 Malignant neoplasm of unspecified site of right female breast: Secondary | ICD-10-CM | POA: Diagnosis not present

## 2014-11-07 DIAGNOSIS — E538 Deficiency of other specified B group vitamins: Secondary | ICD-10-CM

## 2014-11-07 DIAGNOSIS — C779 Secondary and unspecified malignant neoplasm of lymph node, unspecified: Secondary | ICD-10-CM

## 2014-11-07 DIAGNOSIS — M818 Other osteoporosis without current pathological fracture: Secondary | ICD-10-CM

## 2014-11-07 DIAGNOSIS — Z171 Estrogen receptor negative status [ER-]: Secondary | ICD-10-CM | POA: Diagnosis not present

## 2014-11-07 DIAGNOSIS — Z87442 Personal history of urinary calculi: Secondary | ICD-10-CM

## 2014-11-07 DIAGNOSIS — D509 Iron deficiency anemia, unspecified: Secondary | ICD-10-CM | POA: Diagnosis not present

## 2014-11-07 DIAGNOSIS — M129 Arthropathy, unspecified: Secondary | ICD-10-CM

## 2014-11-07 DIAGNOSIS — I10 Essential (primary) hypertension: Secondary | ICD-10-CM

## 2014-11-07 DIAGNOSIS — R000 Tachycardia, unspecified: Secondary | ICD-10-CM

## 2014-11-07 DIAGNOSIS — E785 Hyperlipidemia, unspecified: Secondary | ICD-10-CM

## 2014-11-07 DIAGNOSIS — Z7982 Long term (current) use of aspirin: Secondary | ICD-10-CM

## 2014-11-07 DIAGNOSIS — Z5111 Encounter for antineoplastic chemotherapy: Secondary | ICD-10-CM | POA: Diagnosis not present

## 2014-11-07 DIAGNOSIS — L989 Disorder of the skin and subcutaneous tissue, unspecified: Secondary | ICD-10-CM

## 2014-11-07 DIAGNOSIS — R634 Abnormal weight loss: Secondary | ICD-10-CM

## 2014-11-07 NOTE — Progress Notes (Signed)
Pt here today for follow up; spoke with L Herring, NP on Saturday regarding redness and drainage around right mastectomy site; started on Doxycycline 100 mg BID for 10 days; here for Dr. Mike Gip to assess; no fever; no pain

## 2014-11-07 NOTE — Progress Notes (Signed)
[facility] day:  11/02/2014  Chief Complaint: Dawn Wiley is an 80 y.o. female with stage IIIC right breast cancer who is seen for further discussion regarding direction of therapy.  HPI: The patient was last seen in the medical oncology clinic on 10/27/2014.  At that time, we discussed weekly Taxol.  Tumor had been tested and confirmed triple negative.  We discussed radiation at the completion of chemotherapy.  She was encouraged to follow-up with dentistry prior to starting a bisphosphonate for her osteoporosis.  She continued weekly B12 for known B12 deficiency.  During the interim, she denies any new complaint.  She states that ferrous sulfate causes diarrhea.  She quit calcium, vitamin D, and iron.  She is ready to begin chemotherapy.  Past Medical History  Diagnosis Date  . Kidney stones   . Hyperlipidemia   . Coronary artery disease     Coronary calcifications noted on CT scan  . Diabetes mellitus without complication   . Nephrolithiasis   . Hypertension   . Arthritis   . Anemia   . Tachycardia     Dr. Fletcher Anon, cardiologist  . Cancer     breast (right)  . PONV (postoperative nausea and vomiting)   . Rash     back  . Breast cancer     Past Surgical History  Procedure Laterality Date  . Temporomandibular joint surgery    . Cataract extraction      bilateral  . Breast surgery      breast biopsy X 3  . Eye surgery      bilateral cataract extraction  . Mastectomy modified radical Right 08/31/2014    Procedure: MASTECTOMY MODIFIED RADICAL;  Surgeon: Molly Maduro, MD;  Location: [facility] ORS;  Service: General;  Laterality: Right;  . Portacath placement Left 10/20/2014    Procedure: INSERTION PORT-A-CATH;  Surgeon: Marlyce Huge, MD;  Location: [facility] ORS;  Service: General;  Laterality: Left;    Family History  Problem Relation Age of Onset  . Peripheral Artery Disease Sister     carotid artery stenosis   . Heart  disease Sister   . Diabetes Sister     Social History:  reports that she has never smoked. She has never used smokeless tobacco. She reports that she does not drink alcohol or use illicit drugs.  The patient is accompanied by her daughter.  Allergies: No Known Allergies  Current Medications: Current Outpatient Prescriptions  Medication Sig Dispense Refill  . acetaminophen (TYLENOL) 500 MG tablet Take 500 mg by mouth every 6 (six) hours as needed for mild pain or moderate pain.     [name] Kitchen amLODipine (NORVASC) 5 MG tablet Take 1 tablet (5 mg total) by mouth daily. 90 tablet 1  . aspirin 81 MG tablet Take 81 mg by mouth every other day.    . Calcium Carbonate (CALTRATE 600 PO) Take 1,200 mg by mouth 2 (two) times daily.     . cholecalciferol (VITAMIN D) 400 UNITS TABS tablet Take 800 Units by mouth.    . Cyanocobalamin (VITAMIN B-12 IJ) Inject as directed once a week.    . diphenhydrAMINE (BENADRYL) 25 mg capsule Take 25 mg by mouth every 6 (six) hours as needed for allergies.    [name] Kitchen doxycycline (DORYX) 100 MG EC tablet Take 1 tablet (100 mg total) by mouth 2 (two) times daily. (Patient not taking: Reported on 12/08/2014) 20 tablet 0  . doxycycline (VIBRA-TABS) 100 MG tablet Take 100  mg by mouth 2 (two) times daily.  0  . ferrous sulfate 325 (65 FE) MG tablet Take 650 mg by mouth daily with breakfast.     . ibuprofen (ADVIL,MOTRIN) 400 MG tablet Take 1 tablet (400 mg total) by mouth every 4 (four) hours as needed for mild pain or moderate pain. 30 tablet 0  . lidocaine-prilocaine (EMLA) cream Apply 1 application topically as needed. Apply 1-2 hours prior to chemotherapy 30 g 1  . metFORMIN (GLUCOPHAGE) 500 MG tablet Take 1 tablet (500 mg total) by mouth 2 (two) times daily with a meal. 180 tablet 1  . metoprolol tartrate (LOPRESSOR) 25 MG tablet Take 1 tablet (25 mg total) by mouth 2 (two) times daily. 180 tablet 1  . ondansetron (ZOFRAN) 8 MG tablet Take 1 tablet (8 mg total) by mouth every 8 (eight)  hours as needed for nausea or vomiting. 20 tablet 0  . ONE TOUCH ULTRA TEST test strip   1  . oxyCODONE-acetaminophen (PERCOCET/ROXICET) 5-325 MG per tablet Take 1 tablet by mouth every 4 (four) hours as needed for severe pain. 20 tablet 0   No current facility-administered medications for this visit.    Review of Systems:  GENERAL: Feels "ok". No fevers or sweats. Weight loss of 30 pounds since 01/2014. PERFORMANCE STATUS (ECOG): 1-2 HEENT: Dental issues. No visual changes, runny nose, sore throat, mouth sores or tenderness. Lungs: Shortness of breath. No cough. No hemoptysis. Cardiac: No chest pain, palpitations, orthopnea, or PND. GI: No nausea, vomiting, diarrhea, constipation, melena or hematochezia. GU: No urgency, frequency, dysuria, or hematuria. Musculoskeletal: No back pain. No joint pain. No muscle tenderness. Extremities: No pain or swelling. Skin: No rashes or ulcers. Neuro: Toes stay cold and burn a little (neuropathy).  No headache, numbness or weakness, balance or coordination issues. Endocrine: No diabetes, thyroid issues, hot flashes or night sweats. Psych: No mood changes, depression or anxiety. Pain: No focal pain. Review of systems: All other systems reviewed and found to be negative.  Physical Exam: There were no vitals taken for this visit. GENERAL: Thin elderly woman sitting comfortably in the exam room in no acute distress. MENTAL STATUS: Alert and oriented to person, place and time. HEAD: Short gray hair. Normocephalic, atraumatic, face symmetric, no Cushingoid features. EYES:  Blue eyes.  Pupils equal round and reactive to light and accomodation.  No conjunctivitis or scleral icterus. ENT:  Oropharynx clear without lesion.  Tongue normal. Mucous membranes moist.  RESPIRATORY:  Clear to auscultation without rales, wheezes or rhonchi. CARDIOVASCULAR:  Regular rate and rhythm without murmur, rub or gallop. BREAST:  Right sided  mastectomy incision with 2.5 x 4 cm fullness medially and 3 x 2 cm somewhat nodular area laterally.  Left breast without masses, skin changes or nipple discharge. ABDOMEN:  Soft, non-tender, with active bowel sounds, and no hepatosplenomegaly.  No masses. SKIN:  Breast exam as above.  No rashes, ulcers or lesions. EXTREMITIES: No edema, no skin discoloration or tenderness.  No palpable cords. LYMPH NODES: No palpable cervical, supraclavicular, axillary or inguinal adenopathy  NEUROLOGICAL: Unremarkable. PSYCH:  Appropriate.  Clinical Support on 11/02/2014  Component Date Value Ref Range Status  . WBC 11/02/2014 5.2  3.6 - 11.0 K/uL Final   A-LINE DRAW  . RBC 11/02/2014 3.90  3.80 - 5.20 MIL/uL Final  . Hemoglobin 11/02/2014 8.3* 12.0 - 16.0 g/dL Final   RESULT REPEATED AND VERIFIED  . HCT 11/02/2014 28.2* 35.0 - 47.0 % Final   RESULT REPEATED AND  VERIFIED  . MCV 11/02/2014 72.2* 80.0 - 100.0 fL Final  . MCH 11/02/2014 21.2* 26.0 - 34.0 pg Final  . MCHC 11/02/2014 29.4* 32.0 - 36.0 g/dL Final  . RDW 11/02/2014 20.1* 11.5 - 14.5 % Final  . Platelets 11/02/2014 400  150 - 440 K/uL Final  . Neutrophils Relative % 11/02/2014 63%   Final  . Neutro Abs 11/02/2014 3.3  1.4 - 6.5 K/uL Final  . Lymphocytes Relative 11/02/2014 23%   Final  . Lymphs Abs 11/02/2014 1.2  1.0 - 3.6 K/uL Final  . Monocytes Relative 11/02/2014 9%   Final  . Monocytes Absolute 11/02/2014 0.5  0.2 - 0.9 K/uL Final  . Eosinophils Relative 11/02/2014 3%   Final  . Eosinophils Absolute 11/02/2014 0.1  0 - 0.7 K/uL Final  . Basophils Relative 11/02/2014 2%   Final  . Basophils Absolute 11/02/2014 0.1  0 - 0.1 K/uL Final  . Sodium 11/02/2014 134* 135 - 145 mmol/L Final  . Potassium 11/02/2014 3.8  3.5 - 5.1 mmol/L Final  . Chloride 11/02/2014 105  101 - 111 mmol/L Final  . CO2 11/02/2014 24  22 - 32 mmol/L Final  . Glucose, Bld 11/02/2014 105* 65 - 99 mg/dL Final  . BUN 11/02/2014 17  6 - 20 mg/dL Final  . Creatinine,  Ser 11/02/2014 0.53  0.44 - 1.00 mg/dL Final  . Calcium 11/02/2014 8.4* 8.9 - 10.3 mg/dL Final  . Total Protein 11/02/2014 7.1  6.5 - 8.1 g/dL Final  . Albumin 11/02/2014 3.8  3.5 - 5.0 g/dL Final  . AST 11/02/2014 15  15 - 41 U/L Final  . ALT 11/02/2014 9* 14 - 54 U/L Final  . Alkaline Phosphatase 11/02/2014 61  38 - 126 U/L Final  . Total Bilirubin 11/02/2014 0.5  0.3 - 1.2 mg/dL Final  . GFR calc non Af Amer 11/02/2014 >60  >60 mL/min Final  . GFR calc Af Amer 11/02/2014 >60  >60 mL/min Final   Comment: (NOTE) The eGFR has been calculated using the CKD EPI equation. This calculation has not been validated in all clinical situations. eGFR's persistently <60 mL/min signify possible Chronic Kidney Disease.   . Anion gap 11/02/2014 5  5 - 15 Final  . Hgb A1c MFr Bld 11/02/2014 6.3* 4.0 - 6.0 % Final    Assessment:  ZELDA REAMES is an 80 y.o. female with stage IIIC right breast cancer status post mastectomy and axillary lymph node dissection on 08/31/2014. 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 Carcinoma involved skeletal muscle and dermal lymphatics. Tumor was less than 0.5 mm from the deep margin. Thirteen of 15 lymph nodes were involved. The largest metastatic focus was 2 cm. Tumor is triple negative (ER negative, PR negative, and HER-2/neu negative). Pathologic stage was [id number].  PET scan on 09/21/2014 revealed postoperative changes in the right chest with no suspicious findings or residual tumor. Bone scan on 10/11/2014 revealed no evidence of metastatic disease.  Bone density study on 10/02/2014 revealed a T score of -4.7 in the forearm consistent with osteoporosis. T-score was less than 2.5 in the spine or hip. Echocardiogram on 10/02/2014 revealed ejection fraction of 60-65%.  She has iron deficiency anemia. Labs on 09/19/2014 revealed a hematocrit of 29.9, hemoglobin 8.7, MCV 69.7, ferritin 9, and TIBC 616. B12  was low (265) with a prior history of B12 deficiency and need for supplementation (stopped 06/2014). She began B12 on 10/15/2014.  Folate was normal. Her  diet is modest. She has never had a colonoscopy. She denies any melena or hematochezia.  Oral iron causes diarrhea.  Symptomatically, she denies any new complaints. Exam reveals chest wall changes.  Plan: 1. Labs today:  CBC with diff, CMP. 2. Review side effects of chemotherapy.  Patient consented to treatment. 3. Week #1 Taxol tomorrow. 4. Discuss anti-emetics.  Rx: ondansetron.  Discuss need for eye check if scopolamine used. 5. B12 tomorrow. 6. Discuss trying 1 iron pill a day with OJ or vitamin C. 7. Discuss importance of calcium and vitamin D. 8. Return to clinic on 11/10/2014 for MD assessment, labs (CBC with diff, CMP, magnesium), week #2 Taxol, and B12.    [name], MD  11/02/2014

## 2014-11-07 NOTE — Progress Notes (Signed)
[facility] day:  11/07/2014  Chief Complaint: Dawn Wiley is an 80 y.o. female with stage IIIC right breast cancer currently day 5 of week #1 Taxol who is seen for sick call visit.  HPI: The patient was last seen in the medical oncology clinic on 11/02/2014.  At that time, she received week #1 Taxol.  She tolerated her chemotherapy well.  She denied any nausea.  She notes that during the interim, she has had yellow/white spots come up on the medial lesion on her right chest wall.  She spoke with Georgeanne Nim, NP who placed her on doxycycline on 11/05/2014.  She notes that the redness went away yesterday.  She denies any fever or tenderness.    Past Medical History  Diagnosis Date  . Kidney stones   . Hyperlipidemia   . Coronary artery disease     Coronary calcifications noted on CT scan  . Diabetes mellitus without complication   . Nephrolithiasis   . Hypertension   . Arthritis   . Anemia   . Tachycardia     Dr. Fletcher Anon, cardiologist  . Cancer     breast (right)  . PONV (postoperative nausea and vomiting)   . Rash     back  . Breast cancer     Past Surgical History  Procedure Laterality Date  . Temporomandibular joint surgery    . Cataract extraction      bilateral  . Breast surgery      breast biopsy X 3  . Eye surgery      bilateral cataract extraction  . Mastectomy modified radical Right 08/31/2014    Procedure: MASTECTOMY MODIFIED RADICAL;  Surgeon: Molly Maduro, MD;  Location: [facility] ORS;  Service: General;  Laterality: Right;  . Portacath placement Left 10/20/2014    Procedure: INSERTION PORT-A-CATH;  Surgeon: Marlyce Huge, MD;  Location: [facility] ORS;  Service: General;  Laterality: Left;    Family History  Problem Relation Age of Onset  . Peripheral Artery Disease Sister     carotid artery stenosis   . Heart disease Sister   . Diabetes Sister     Social History:  reports that she has never smoked. She  has never used smokeless tobacco. She reports that she does not drink alcohol or use illicit drugs.  The patient is alone today.  Allergies: No Known Allergies  Current Medications: Current Outpatient Prescriptions  Medication Sig Dispense Refill  . acetaminophen (TYLENOL) 500 MG tablet Take 500 mg by mouth every 6 (six) hours as needed for mild pain or moderate pain.     [name] Kitchen amLODipine (NORVASC) 5 MG tablet Take 1 tablet (5 mg total) by mouth daily. 90 tablet 1  . aspirin 81 MG tablet Take 81 mg by mouth every other day.    . Calcium Carbonate (CALTRATE 600 PO) Take 1,200 mg by mouth 2 (two) times daily.     . cholecalciferol (VITAMIN D) 1000 UNITS tablet Take 800 Units by mouth 2 (two) times daily.     . diphenhydrAMINE (BENADRYL) 25 mg capsule Take 25 mg by mouth every 6 (six) hours as needed for allergies.    [name] Kitchen doxycycline (DORYX) 100 MG EC tablet Take 1 tablet (100 mg total) by mouth 2 (two) times daily. 20 tablet 0  . doxycycline (VIBRA-TABS) 100 MG tablet Take 100 mg by mouth 2 (two) times daily.  0  . ferrous sulfate 325 (65 FE) MG tablet Take 650 mg by  mouth daily with breakfast.     . ibuprofen (ADVIL,MOTRIN) 400 MG tablet Take 1 tablet (400 mg total) by mouth every 4 (four) hours as needed for mild pain or moderate pain. 30 tablet 0  . lidocaine-prilocaine (EMLA) cream Apply 1 application topically as needed. Apply 1-2 hours prior to chemotherapy 30 g 1  . metFORMIN (GLUCOPHAGE) 500 MG tablet Take 1 tablet (500 mg total) by mouth 2 (two) times daily with a meal. 180 tablet 1  . metoprolol tartrate (LOPRESSOR) 25 MG tablet Take 1 tablet (25 mg total) by mouth 2 (two) times daily. 180 tablet 1  . ondansetron (ZOFRAN) 8 MG tablet Take 1 tablet (8 mg total) by mouth every 8 (eight) hours as needed for nausea or vomiting. 20 tablet 0  . oxyCODONE-acetaminophen (PERCOCET/ROXICET) 5-325 MG per tablet Take 1 tablet by mouth every 4 (four) hours as needed for severe pain. 20 tablet 0   No  current facility-administered medications for this visit.    Review of Systems:  GENERAL: Feels "ok". No fevers or sweats.  Weight stable. PERFORMANCE STATUS (ECOG): 1-2 HEENT: No visual changes, runny nose, sore throat, mouth sores or tenderness. Lungs:  No increased shortness of breath. No cough. No hemoptysis. Cardiac: No chest pain, palpitations, orthopnea, or PND. GI: No nausea, vomiting, diarrhea, constipation, melena or hematochezia. GU: No urgency, frequency, dysuria, or hematuria. Musculoskeletal: No back pain. No joint pain. No muscle tenderness. Extremities: No pain or swelling. Skin: Right chest wall changes (see HPI). Neuro: No headache, numbness or weakness, balance or coordination issues. Endocrine: No diabetes, thyroid issues, hot flashes or night sweats. Psych: No mood changes, depression or anxiety. Pain: No focal pain. Review of systems: All other systems reviewed and found to be negative.  Physical Exam: Blood pressure 155/75, pulse 76, temperature 95.5 F (35.3 C), temperature source Tympanic, height 5' (1.524 m), weight 109 lb 9.1 oz (49.7 kg). GENERAL: Thin elderly woman sitting comfortably in the exam room in no acute distress. MENTAL STATUS: Alert and oriented to person, place and time. HEAD: Short gray hair. Normocephalic, atraumatic, face symmetric, no Cushingoid features. EYES: Blue eyes. No conjunctivitis or scleral icterus. BREAST: Right sided mastectomy incision with 2.5 x 2 cm nodular area with central crusting medially and 2 x 2 cm somewhat nodular area laterally. lesions are light pink without increased warmth or tenderness.  0.5 cm lesion superior to incision between 2 nodular areas.  No other chest wall changes.  PSYCH: Appropriate.   No visits with results within 3 Day(s) from this visit. Latest known visit with results is:  Clinical Support on 11/02/2014  Component Date Value Ref Range Status  . WBC 11/02/2014 5.2   3.6 - 11.0 K/uL Final   A-LINE DRAW  . RBC 11/02/2014 3.90  3.80 - 5.20 MIL/uL Final  . Hemoglobin 11/02/2014 8.3* 12.0 - 16.0 g/dL Final   RESULT REPEATED AND VERIFIED  . HCT 11/02/2014 28.2* 35.0 - 47.0 % Final   RESULT REPEATED AND VERIFIED  . MCV 11/02/2014 72.2* 80.0 - 100.0 fL Final  . MCH 11/02/2014 21.2* 26.0 - 34.0 pg Final  . MCHC 11/02/2014 29.4* 32.0 - 36.0 g/dL Final  . RDW 11/02/2014 20.1* 11.5 - 14.5 % Final  . Platelets 11/02/2014 400  150 - 440 K/uL Final  . Neutrophils Relative % 11/02/2014 63%   Final  . Neutro Abs 11/02/2014 3.3  1.4 - 6.5 K/uL Final  . Lymphocytes Relative 11/02/2014 23%   Final  . Lymphs Abs  11/02/2014 1.2  1.0 - 3.6 K/uL Final  . Monocytes Relative 11/02/2014 9%   Final  . Monocytes Absolute 11/02/2014 0.5  0.2 - 0.9 K/uL Final  . Eosinophils Relative 11/02/2014 3%   Final  . Eosinophils Absolute 11/02/2014 0.1  0 - 0.7 K/uL Final  . Basophils Relative 11/02/2014 2%   Final  . Basophils Absolute 11/02/2014 0.1  0 - 0.1 K/uL Final  . Sodium 11/02/2014 134* 135 - 145 mmol/L Final  . Potassium 11/02/2014 3.8  3.5 - 5.1 mmol/L Final  . Chloride 11/02/2014 105  101 - 111 mmol/L Final  . CO2 11/02/2014 24  22 - 32 mmol/L Final  . Glucose, Bld 11/02/2014 105* 65 - 99 mg/dL Final  . BUN 11/02/2014 17  6 - 20 mg/dL Final  . Creatinine, Ser 11/02/2014 0.53  0.44 - 1.00 mg/dL Final  . Calcium 11/02/2014 8.4* 8.9 - 10.3 mg/dL Final  . Total Protein 11/02/2014 7.1  6.5 - 8.1 g/dL Final  . Albumin 11/02/2014 3.8  3.5 - 5.0 g/dL Final  . AST 11/02/2014 15  15 - 41 U/L Final  . ALT 11/02/2014 9* 14 - 54 U/L Final  . Alkaline Phosphatase 11/02/2014 61  38 - 126 U/L Final  . Total Bilirubin 11/02/2014 0.5  0.3 - 1.2 mg/dL Final  . GFR calc non Af Amer 11/02/2014 >60  >60 mL/min Final  . GFR calc Af Amer 11/02/2014 >60  >60 mL/min Final   Comment: (NOTE) The eGFR has been calculated using the CKD EPI equation. This calculation has not been validated in all  clinical situations. eGFR's persistently <60 mL/min signify possible Chronic Kidney Disease.   . Anion gap 11/02/2014 5  5 - 15 Final  . Hgb A1c MFr Bld 11/02/2014 6.3* 4.0 - 6.0 % Final    Assessment:  Dawn Wiley is an 81 y.o. female with stage IIIC right breast cancer status post mastectomy and axillary lymph node dissection on 08/31/2014. 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 Carcinoma involved skeletal muscle and dermal lymphatics. Tumor was less than 0.5 mm from the deep margin. Thirteen of 15 lymph nodes were involved. The largest metastatic focus was 2 cm. Tumor is triple negative (ER negative, PR negative, and HER-2/neu negative). Pathologic stage was [id number].  PET scan on 09/21/2014 revealed postoperative changes in the right chest with no suspicious findings or residual tumor. Bone scan on 10/11/2014 revealed no evidence of metastatic disease.  Bone density study on 10/02/2014 revealed a T score of -4.7 in the forearm consistent with osteoporosis. T-score was less than 2.5 in the spine or hip. Echocardiogram on 10/02/2014 revealed ejection fraction of 60-65%.  She has iron deficiency anemia. Labs on 09/19/2014 revealed a hematocrit of 29.9, hemoglobin 8.7, MCV 69.7, ferritin 9, and TIBC 616. B12 was low (265) with a prior history of B12 deficiency and need for supplementation (stopped 06/2014). She began B12 on 10/15/2014.  Folate was normal. Her diet is modest. She has never had a colonoscopy. She denies any melena or hematochezia.  Oral iron causes diarrhea.  She began weekly Taxol on 11/03/2014.  She has evolving nodular changes medially and laterally associated with the right chest wall mastectomy incision worrisome for tumor growth.  Clinically, she does not appear to have an infection.  She began doxycycline on 11/05/2014.    Plan: 1. Continue doxycycline. 2. Schedule follow-up with Dr. Rexene Edison (taken  over for Dr. Leanora Cover). 3. RTC on 07/15 as  previously scheduled for week #2 Taxol.    [name], MD  11/07/2014, 8:39 AM

## 2014-11-08 ENCOUNTER — Encounter: Payer: Self-pay | Admitting: Surgery

## 2014-11-08 ENCOUNTER — Ambulatory Visit (INDEPENDENT_AMBULATORY_CARE_PROVIDER_SITE_OTHER): Payer: PPO | Admitting: Surgery

## 2014-11-08 VITALS — BP 148/69 | HR 76 | Temp 97.7°F | Resp 20 | Ht 60.0 in | Wt 110.0 lb

## 2014-11-08 DIAGNOSIS — C50911 Malignant neoplasm of unspecified site of right female breast: Secondary | ICD-10-CM

## 2014-11-08 NOTE — Progress Notes (Signed)
Surgery Progress Note  S: Doing well.  Tolerated first dose of chemotherapy.  Concern for some hardness along incision O: Blood pressure 148/69, pulse 76, temperature 97.7 F (36.5 C), temperature source Oral, resp. rate 20, height 5' (1.524 m), weight 49.896 kg (110 lb). GEN: NAD/A&Ox3 BREAST: Nonhealing wound with firmness at medial aspect of incision approx 2.5 x 2 cm, firmess at lateral aspect of incision approx 1.5 x 1.5 cm, approx 1 x 1 cm nodule superior to incision  A/P 80 yo F s/p right mastectomy, port a cath.  I am concerned for possible recurrence superior to incision and at lateral aspects.  Will plan on biopsy in office later today.  Biopsy Procedure Note  Informed consent obtained.  Right chest prepped and draped.  Timeout performed.  All 3 sites, Medial, middle, lateral, infiltrated with 1% lidocaine. Each had 1 punch biopsy (35mm) performed.  Wounds closed with interrupted 3-0 nylon suture.  Dressing placed.  No acute complications, needle, sponge and instrument count correct at end of procedure.  Total encounter time with patient 45 minutes.

## 2014-11-10 ENCOUNTER — Inpatient Hospital Stay (HOSPITAL_BASED_OUTPATIENT_CLINIC_OR_DEPARTMENT_OTHER): Payer: PPO | Admitting: Hematology and Oncology

## 2014-11-10 ENCOUNTER — Inpatient Hospital Stay: Payer: PPO

## 2014-11-10 ENCOUNTER — Other Ambulatory Visit: Payer: Self-pay

## 2014-11-10 VITALS — BP 149/68 | HR 80 | Temp 96.7°F | Ht 61.0 in | Wt 109.3 lb

## 2014-11-10 VITALS — BP 135/62 | HR 74 | Resp 20

## 2014-11-10 DIAGNOSIS — M818 Other osteoporosis without current pathological fracture: Secondary | ICD-10-CM

## 2014-11-10 DIAGNOSIS — D509 Iron deficiency anemia, unspecified: Secondary | ICD-10-CM

## 2014-11-10 DIAGNOSIS — R000 Tachycardia, unspecified: Secondary | ICD-10-CM

## 2014-11-10 DIAGNOSIS — E785 Hyperlipidemia, unspecified: Secondary | ICD-10-CM

## 2014-11-10 DIAGNOSIS — C50911 Malignant neoplasm of unspecified site of right female breast: Secondary | ICD-10-CM

## 2014-11-10 DIAGNOSIS — Z7982 Long term (current) use of aspirin: Secondary | ICD-10-CM

## 2014-11-10 DIAGNOSIS — C779 Secondary and unspecified malignant neoplasm of lymph node, unspecified: Secondary | ICD-10-CM | POA: Diagnosis not present

## 2014-11-10 DIAGNOSIS — E538 Deficiency of other specified B group vitamins: Secondary | ICD-10-CM

## 2014-11-10 DIAGNOSIS — E119 Type 2 diabetes mellitus without complications: Secondary | ICD-10-CM

## 2014-11-10 DIAGNOSIS — Z87442 Personal history of urinary calculi: Secondary | ICD-10-CM

## 2014-11-10 DIAGNOSIS — Z79899 Other long term (current) drug therapy: Secondary | ICD-10-CM

## 2014-11-10 DIAGNOSIS — M129 Arthropathy, unspecified: Secondary | ICD-10-CM

## 2014-11-10 DIAGNOSIS — I10 Essential (primary) hypertension: Secondary | ICD-10-CM

## 2014-11-10 DIAGNOSIS — I2510 Atherosclerotic heart disease of native coronary artery without angina pectoris: Secondary | ICD-10-CM

## 2014-11-10 DIAGNOSIS — Z171 Estrogen receptor negative status [ER-]: Secondary | ICD-10-CM | POA: Diagnosis not present

## 2014-11-10 DIAGNOSIS — Z9011 Acquired absence of right breast and nipple: Secondary | ICD-10-CM

## 2014-11-10 DIAGNOSIS — Z5111 Encounter for antineoplastic chemotherapy: Secondary | ICD-10-CM | POA: Diagnosis not present

## 2014-11-10 DIAGNOSIS — R634 Abnormal weight loss: Secondary | ICD-10-CM

## 2014-11-10 LAB — CBC WITH DIFFERENTIAL/PLATELET
Basophils Absolute: 0.1 10*3/uL (ref 0–0.1)
Basophils Relative: 2 %
Eosinophils Absolute: 0.2 10*3/uL (ref 0–0.7)
Eosinophils Relative: 5 %
HCT: 28.3 % — ABNORMAL LOW (ref 35.0–47.0)
Hemoglobin: 8.6 g/dL — ABNORMAL LOW (ref 12.0–16.0)
Lymphocytes Relative: 28 %
Lymphs Abs: 0.9 10*3/uL — ABNORMAL LOW (ref 1.0–3.6)
MCH: 21.8 pg — ABNORMAL LOW (ref 26.0–34.0)
MCHC: 30.2 g/dL — ABNORMAL LOW (ref 32.0–36.0)
MCV: 72.1 fL — ABNORMAL LOW (ref 80.0–100.0)
Monocytes Absolute: 0.2 10*3/uL (ref 0.2–0.9)
Monocytes Relative: 7 %
Neutro Abs: 1.9 10*3/uL (ref 1.4–6.5)
Neutrophils Relative %: 58 %
Platelets: 326 10*3/uL (ref 150–440)
RBC: 3.93 MIL/uL (ref 3.80–5.20)
RDW: 19.4 % — ABNORMAL HIGH (ref 11.5–14.5)
WBC: 3.4 10*3/uL — ABNORMAL LOW (ref 3.6–11.0)

## 2014-11-10 LAB — COMPREHENSIVE METABOLIC PANEL
ALT: 10 U/L — ABNORMAL LOW (ref 14–54)
AST: 15 U/L (ref 15–41)
Albumin: 4.1 g/dL (ref 3.5–5.0)
Alkaline Phosphatase: 55 U/L (ref 38–126)
Anion gap: 9 (ref 5–15)
BUN: 17 mg/dL (ref 6–20)
CO2: 24 mmol/L (ref 22–32)
Calcium: 8.9 mg/dL (ref 8.9–10.3)
Chloride: 104 mmol/L (ref 101–111)
Creatinine, Ser: 0.6 mg/dL (ref 0.44–1.00)
GFR calc Af Amer: >60 mL/min (ref >60)
GFR calc non Af Amer: >60 mL/min (ref >60)
Glucose, Bld: 139 mg/dL — ABNORMAL HIGH (ref 65–99)
Potassium: 3.9 mmol/L (ref 3.5–5.1)
Sodium: 137 mmol/L (ref 135–145)
Total Bilirubin: 0.5 mg/dL (ref 0.3–1.2)
Total Protein: 7 g/dL (ref 6.5–8.1)

## 2014-11-10 MED ORDER — CYANOCOBALAMIN 1000 MCG/ML IJ SOLN
1000.0000 ug | Freq: Once | INTRAMUSCULAR | Status: AC
  Administered 2014-11-10: 1000 ug via INTRAMUSCULAR
  Filled 2014-11-10: qty 1

## 2014-11-10 MED ORDER — HEPARIN SOD (PORK) LOCK FLUSH 100 UNIT/ML IV SOLN
500.0000 [IU] | Freq: Once | INTRAVENOUS | Status: AC | PRN
  Administered 2014-11-10: 500 [IU]
  Filled 2014-11-10: qty 5

## 2014-11-10 MED ORDER — SODIUM CHLORIDE 0.9 % IV SOLN
Freq: Once | INTRAVENOUS | Status: AC
  Administered 2014-11-10: 12:00:00 via INTRAVENOUS
  Filled 2014-11-10: qty 4

## 2014-11-10 MED ORDER — SODIUM CHLORIDE 0.9 % IJ SOLN
10.0000 mL | INTRAMUSCULAR | Status: DC | PRN
  Administered 2014-11-10: 10 mL
  Filled 2014-11-10: qty 10

## 2014-11-10 MED ORDER — PACLITAXEL CHEMO INJECTION 300 MG/50ML
80.0000 mg/m2 | Freq: Once | INTRAVENOUS | Status: AC
  Administered 2014-11-10: 114 mg via INTRAVENOUS
  Filled 2014-11-10: qty 19

## 2014-11-10 MED ORDER — FAMOTIDINE IN NACL 20-0.9 MG/50ML-% IV SOLN
20.0000 mg | Freq: Once | INTRAVENOUS | Status: AC
  Administered 2014-11-10: 20 mg via INTRAVENOUS
  Filled 2014-11-10: qty 50

## 2014-11-10 MED ORDER — SODIUM CHLORIDE 0.9 % IV SOLN
Freq: Once | INTRAVENOUS | Status: AC
  Administered 2014-11-10: 12:00:00 via INTRAVENOUS
  Filled 2014-11-10: qty 1000

## 2014-11-10 MED ORDER — DIPHENHYDRAMINE HCL 50 MG/ML IJ SOLN
50.0000 mg | Freq: Once | INTRAMUSCULAR | Status: AC
  Administered 2014-11-10: 50 mg via INTRAVENOUS
  Filled 2014-11-10: qty 1

## 2014-11-10 NOTE — Progress Notes (Signed)
Pt here today for folllow up regarding breast cancer; saw Dr. Rexene Edison July 13 th for skin biopsy on right breast area; second taxol treatment and B 12 today; offers no complaints today

## 2014-11-17 ENCOUNTER — Inpatient Hospital Stay: Payer: PPO

## 2014-11-17 ENCOUNTER — Other Ambulatory Visit: Payer: Self-pay

## 2014-11-17 DIAGNOSIS — E538 Deficiency of other specified B group vitamins: Secondary | ICD-10-CM

## 2014-11-17 DIAGNOSIS — C50911 Malignant neoplasm of unspecified site of right female breast: Secondary | ICD-10-CM

## 2014-11-17 DIAGNOSIS — Z5111 Encounter for antineoplastic chemotherapy: Secondary | ICD-10-CM | POA: Diagnosis not present

## 2014-11-17 LAB — CBC WITH DIFFERENTIAL/PLATELET
Basophils Absolute: 0.1 10*3/uL (ref 0–0.1)
Basophils Relative: 2 %
Eosinophils Absolute: 0.1 10*3/uL (ref 0–0.7)
Eosinophils Relative: 3 %
HCT: 28.5 % — ABNORMAL LOW (ref 35.0–47.0)
Hemoglobin: 8.5 g/dL — ABNORMAL LOW (ref 12.0–16.0)
Lymphocytes Relative: 30 %
Lymphs Abs: 0.9 10*3/uL — ABNORMAL LOW (ref 1.0–3.6)
MCH: 22.2 pg — ABNORMAL LOW (ref 26.0–34.0)
MCHC: 30 g/dL — ABNORMAL LOW (ref 32.0–36.0)
MCV: 74 fL — ABNORMAL LOW (ref 80.0–100.0)
Monocytes Absolute: 0.3 10*3/uL (ref 0.2–0.9)
Monocytes Relative: 9 %
Neutro Abs: 1.8 10*3/uL (ref 1.4–6.5)
Neutrophils Relative %: 56 %
Platelets: 295 10*3/uL (ref 150–440)
RBC: 3.85 MIL/uL (ref 3.80–5.20)
RDW: 20.3 % — ABNORMAL HIGH (ref 11.5–14.5)
WBC: 3.2 10*3/uL — ABNORMAL LOW (ref 3.6–11.0)

## 2014-11-17 LAB — BASIC METABOLIC PANEL
Anion gap: 6 (ref 5–15)
BUN: 18 mg/dL (ref 6–20)
CO2: 24 mmol/L (ref 22–32)
Calcium: 8.9 mg/dL (ref 8.9–10.3)
Chloride: 107 mmol/L (ref 101–111)
Creatinine, Ser: 0.53 mg/dL (ref 0.44–1.00)
GFR calc Af Amer: >60 mL/min (ref >60)
GFR calc non Af Amer: >60 mL/min (ref >60)
Glucose, Bld: 151 mg/dL — ABNORMAL HIGH (ref 65–99)
Potassium: 3.9 mmol/L (ref 3.5–5.1)
Sodium: 137 mmol/L (ref 135–145)

## 2014-11-17 MED ORDER — FAMOTIDINE IN NACL 20-0.9 MG/50ML-% IV SOLN
20.0000 mg | Freq: Once | INTRAVENOUS | Status: AC
  Administered 2014-11-17: 20 mg via INTRAVENOUS
  Filled 2014-11-17: qty 50

## 2014-11-17 MED ORDER — PACLITAXEL CHEMO INJECTION 300 MG/50ML
80.0000 mg/m2 | Freq: Once | INTRAVENOUS | Status: AC
  Administered 2014-11-17: 114 mg via INTRAVENOUS
  Filled 2014-11-17: qty 19

## 2014-11-17 MED ORDER — DIPHENHYDRAMINE HCL 50 MG/ML IJ SOLN
50.0000 mg | Freq: Once | INTRAMUSCULAR | Status: AC
  Administered 2014-11-17: 50 mg via INTRAVENOUS
  Filled 2014-11-17: qty 1

## 2014-11-17 MED ORDER — CYANOCOBALAMIN 1000 MCG/ML IJ SOLN
1000.0000 ug | Freq: Once | INTRAMUSCULAR | Status: AC
  Administered 2014-11-17: 1000 ug via INTRAMUSCULAR
  Filled 2014-11-17: qty 1

## 2014-11-17 MED ORDER — SODIUM CHLORIDE 0.9 % IV SOLN
Freq: Once | INTRAVENOUS | Status: AC
  Administered 2014-11-17: 16:00:00 via INTRAVENOUS
  Filled 2014-11-17: qty 4

## 2014-11-17 MED ORDER — HEPARIN SOD (PORK) LOCK FLUSH 100 UNIT/ML IV SOLN
500.0000 [IU] | Freq: Once | INTRAVENOUS | Status: AC
  Administered 2014-11-17: 500 [IU] via INTRAVENOUS
  Filled 2014-11-17: qty 5

## 2014-11-17 MED ORDER — SODIUM CHLORIDE 0.9 % IV SOLN
Freq: Once | INTRAVENOUS | Status: AC
  Administered 2014-11-17: 16:00:00 via INTRAVENOUS
  Filled 2014-11-17: qty 1000

## 2014-11-17 MED ORDER — SODIUM CHLORIDE 0.9 % IJ SOLN
10.0000 mL | INTRAMUSCULAR | Status: DC | PRN
  Filled 2014-11-17: qty 10

## 2014-11-20 ENCOUNTER — Telehealth: Payer: Self-pay

## 2014-11-20 NOTE — Telephone Encounter (Signed)
Spoke with patient at this time. Explained that pathology was back from 7/13 biopsy and that Dr. Rexene Edison would like to review with her in office on 7/26 at 1500 in [place]. She verbalizes understanding and states that she will be at this appointment.

## 2014-11-21 ENCOUNTER — Ambulatory Visit (INDEPENDENT_AMBULATORY_CARE_PROVIDER_SITE_OTHER): Payer: PPO | Admitting: Surgery

## 2014-11-21 ENCOUNTER — Encounter: Payer: Self-pay | Admitting: Surgery

## 2014-11-21 VITALS — BP 150/82 | HR 92 | Temp 98.1°F | Ht 61.0 in | Wt 108.0 lb

## 2014-11-21 DIAGNOSIS — Z09 Encounter for follow-up examination after completed treatment for conditions other than malignant neoplasm: Secondary | ICD-10-CM

## 2014-11-21 NOTE — Progress Notes (Signed)
CC: Follow up from biopsy at prior mastectomy site  HPI: Dawn Wiley returns for follow up from her recent punch biopsy of a new supraincisional nodule and two biopsies of the end sections of her mastectomy scar. Doing well with chemo.  No wound issues  Blood pressure 150/82, pulse 92, temperature 98.1 F (36.7 C), temperature source Oral, height 5\' 1"  (1.549 m), weight 48.988 kg (108 lb). GEN: NAD/A&Ox3 BREAST: still with hard nodules at end of incisions medial 4 x 3 cm largest,  supraincisional nodule was much less palpable than before  Path: Invasive breast cancer with lymphatic involvement  I have reviewed path with patient and Dr. Mike Gip.  I have explained that we both agree that she should progress with her chemoradiation and then discuss excision after this, as you would with a normal inflammatory breast cancer.  I have discussed this with her >25 minutes and she expressed understanding.  Sutures were removed from biopsy sites without issues.

## 2014-11-21 NOTE — Patient Instructions (Signed)
Give [place] a call if you have any questions or concerns. Continue with your chemotherapy and radiation as planned.

## 2014-11-23 ENCOUNTER — Other Ambulatory Visit: Payer: Self-pay

## 2014-11-23 DIAGNOSIS — C50911 Malignant neoplasm of unspecified site of right female breast: Secondary | ICD-10-CM

## 2014-11-24 ENCOUNTER — Inpatient Hospital Stay (HOSPITAL_BASED_OUTPATIENT_CLINIC_OR_DEPARTMENT_OTHER): Payer: PPO | Admitting: Hematology and Oncology

## 2014-11-24 ENCOUNTER — Inpatient Hospital Stay: Payer: PPO

## 2014-11-24 VITALS — BP 148/74 | HR 74 | Temp 97.8°F | Ht 60.0 in | Wt 108.7 lb

## 2014-11-24 DIAGNOSIS — C779 Secondary and unspecified malignant neoplasm of lymph node, unspecified: Secondary | ICD-10-CM | POA: Diagnosis not present

## 2014-11-24 DIAGNOSIS — E538 Deficiency of other specified B group vitamins: Secondary | ICD-10-CM

## 2014-11-24 DIAGNOSIS — C50911 Malignant neoplasm of unspecified site of right female breast: Secondary | ICD-10-CM

## 2014-11-24 DIAGNOSIS — D509 Iron deficiency anemia, unspecified: Secondary | ICD-10-CM

## 2014-11-24 DIAGNOSIS — Z87442 Personal history of urinary calculi: Secondary | ICD-10-CM

## 2014-11-24 DIAGNOSIS — Z171 Estrogen receptor negative status [ER-]: Secondary | ICD-10-CM

## 2014-11-24 DIAGNOSIS — Z79899 Other long term (current) drug therapy: Secondary | ICD-10-CM

## 2014-11-24 DIAGNOSIS — M129 Arthropathy, unspecified: Secondary | ICD-10-CM

## 2014-11-24 DIAGNOSIS — N6310 Unspecified lump in the right breast, unspecified quadrant: Secondary | ICD-10-CM

## 2014-11-24 DIAGNOSIS — I2510 Atherosclerotic heart disease of native coronary artery without angina pectoris: Secondary | ICD-10-CM

## 2014-11-24 DIAGNOSIS — Z9011 Acquired absence of right breast and nipple: Secondary | ICD-10-CM

## 2014-11-24 DIAGNOSIS — M818 Other osteoporosis without current pathological fracture: Secondary | ICD-10-CM

## 2014-11-24 DIAGNOSIS — R000 Tachycardia, unspecified: Secondary | ICD-10-CM

## 2014-11-24 DIAGNOSIS — Z5111 Encounter for antineoplastic chemotherapy: Secondary | ICD-10-CM | POA: Diagnosis not present

## 2014-11-24 DIAGNOSIS — E119 Type 2 diabetes mellitus without complications: Secondary | ICD-10-CM

## 2014-11-24 DIAGNOSIS — I10 Essential (primary) hypertension: Secondary | ICD-10-CM

## 2014-11-24 DIAGNOSIS — Z7982 Long term (current) use of aspirin: Secondary | ICD-10-CM

## 2014-11-24 DIAGNOSIS — R634 Abnormal weight loss: Secondary | ICD-10-CM

## 2014-11-24 DIAGNOSIS — E785 Hyperlipidemia, unspecified: Secondary | ICD-10-CM

## 2014-11-24 LAB — COMPREHENSIVE METABOLIC PANEL
ALT: 9 U/L — ABNORMAL LOW (ref 14–54)
AST: 18 U/L (ref 15–41)
Albumin: 4.1 g/dL (ref 3.5–5.0)
Alkaline Phosphatase: 51 U/L (ref 38–126)
Anion gap: 6 (ref 5–15)
BUN: 14 mg/dL (ref 6–20)
CO2: 24 mmol/L (ref 22–32)
Calcium: 8.9 mg/dL (ref 8.9–10.3)
Chloride: 105 mmol/L (ref 101–111)
Creatinine, Ser: 0.55 mg/dL (ref 0.44–1.00)
GFR calc Af Amer: >60 mL/min (ref >60)
GFR calc non Af Amer: >60 mL/min (ref >60)
Glucose, Bld: 218 mg/dL — ABNORMAL HIGH (ref 65–99)
Potassium: 3.9 mmol/L (ref 3.5–5.1)
Sodium: 135 mmol/L (ref 135–145)
Total Bilirubin: 0.7 mg/dL (ref 0.3–1.2)
Total Protein: 6.8 g/dL (ref 6.5–8.1)

## 2014-11-24 LAB — CBC WITH DIFFERENTIAL/PLATELET
Basophils Absolute: 0 10*3/uL (ref 0–0.1)
Basophils Relative: 2 %
Eosinophils Absolute: 0 10*3/uL (ref 0–0.7)
Eosinophils Relative: 1 %
HCT: 29.1 % — ABNORMAL LOW (ref 35.0–47.0)
Hemoglobin: 8.8 g/dL — ABNORMAL LOW (ref 12.0–16.0)
Lymphocytes Relative: 23 %
Lymphs Abs: 0.7 10*3/uL — ABNORMAL LOW (ref 1.0–3.6)
MCH: 22.6 pg — ABNORMAL LOW (ref 26.0–34.0)
MCHC: 30.1 g/dL — ABNORMAL LOW (ref 32.0–36.0)
MCV: 75.2 fL — ABNORMAL LOW (ref 80.0–100.0)
Monocytes Absolute: 0.2 10*3/uL (ref 0.2–0.9)
Monocytes Relative: 8 %
Neutro Abs: 2 10*3/uL (ref 1.4–6.5)
Neutrophils Relative %: 66 %
Platelets: 381 10*3/uL (ref 150–440)
RBC: 3.87 MIL/uL (ref 3.80–5.20)
RDW: 21.5 % — ABNORMAL HIGH (ref 11.5–14.5)
WBC: 3 10*3/uL — ABNORMAL LOW (ref 3.6–11.0)

## 2014-11-24 MED ORDER — SODIUM CHLORIDE 0.9 % IV SOLN
Freq: Once | INTRAVENOUS | Status: AC
  Administered 2014-11-24: 10:00:00 via INTRAVENOUS
  Filled 2014-11-24: qty 1000

## 2014-11-24 MED ORDER — SODIUM CHLORIDE 0.9 % IV SOLN
Freq: Once | INTRAVENOUS | Status: AC
  Administered 2014-11-24: 11:00:00 via INTRAVENOUS
  Filled 2014-11-24: qty 4

## 2014-11-24 MED ORDER — PACLITAXEL CHEMO INJECTION 300 MG/50ML
80.0000 mg/m2 | Freq: Once | INTRAVENOUS | Status: AC
  Administered 2014-11-24: 114 mg via INTRAVENOUS
  Filled 2014-11-24: qty 19

## 2014-11-24 MED ORDER — HEPARIN SOD (PORK) LOCK FLUSH 100 UNIT/ML IV SOLN
500.0000 [IU] | Freq: Once | INTRAVENOUS | Status: AC | PRN
  Administered 2014-11-24: 500 [IU]
  Filled 2014-11-24: qty 5

## 2014-11-24 MED ORDER — FAMOTIDINE IN NACL 20-0.9 MG/50ML-% IV SOLN
20.0000 mg | Freq: Once | INTRAVENOUS | Status: AC
  Administered 2014-11-24: 20 mg via INTRAVENOUS
  Filled 2014-11-24: qty 50

## 2014-11-24 MED ORDER — SODIUM CHLORIDE 0.9 % IJ SOLN
10.0000 mL | INTRAMUSCULAR | Status: DC | PRN
  Administered 2014-11-24: 10 mL
  Filled 2014-11-24: qty 10

## 2014-11-24 MED ORDER — CYANOCOBALAMIN 1000 MCG/ML IJ SOLN
1000.0000 ug | Freq: Once | INTRAMUSCULAR | Status: AC
  Administered 2014-11-24: 1000 ug via INTRAMUSCULAR
  Filled 2014-11-24: qty 1

## 2014-11-24 MED ORDER — DIPHENHYDRAMINE HCL 50 MG/ML IJ SOLN
50.0000 mg | Freq: Once | INTRAMUSCULAR | Status: AC
  Administered 2014-11-24: 50 mg via INTRAVENOUS
  Filled 2014-11-24: qty 1

## 2014-11-24 NOTE — Progress Notes (Signed)
[facility] day:  11/24/2014  Chief Complaint: Dawn Wiley is an 80 y.o. female with stage IIIC right breast cancer who is seen for assessment prior to week #4 Taxol.  HPI: The patient was last seen in the medical oncology clinic on 11/10/2014.  At that time, she received week #2 Taxol.  She also received B12 for documented B12 deficiency.  She had undergone chest wall biopsy x 3 (lateral, medial, and middle sites) by Dr. Rexene Edison on 11/08/2014.  Pathology from all 3 sites revealed dermal lymphatic involvement by invasive mammary carcinoma with micropapillary features.  Symptomatically, she denies any side effects of chemotherapy. She notes that her hair is thinning. She denies any nausea or vomiting.  She denies any chemotherapy induced neuropathy.  She notes that the medial lesion is slightly different.  Past Medical History  Diagnosis Date  . Kidney stones   . Hyperlipidemia   . Coronary artery disease     Coronary calcifications noted on CT scan  . Diabetes mellitus without complication   . Nephrolithiasis   . Hypertension   . Arthritis   . Anemia   . Tachycardia     Dr. Fletcher Anon, cardiologist  . Cancer     breast (right)  . PONV (postoperative nausea and vomiting)   . Rash     back  . Breast cancer     Past Surgical History  Procedure Laterality Date  . Temporomandibular joint surgery    . Cataract extraction      bilateral  . Breast surgery      breast biopsy X 3  . Eye surgery      bilateral cataract extraction  . Mastectomy modified radical Right 08/31/2014    Procedure: MASTECTOMY MODIFIED RADICAL;  Surgeon: Molly Maduro, MD;  Location: [facility] ORS;  Service: General;  Laterality: Right;  . Portacath placement Left 10/20/2014    Procedure: INSERTION PORT-A-CATH;  Surgeon: Marlyce Huge, MD;  Location: [facility] ORS;  Service: General;  Laterality: Left;    Family History  Problem Relation Age of Onset  .  Peripheral Artery Disease Sister     carotid artery stenosis   . Heart disease Sister   . Diabetes Sister     Social History:  reports that she has never smoked. She has never used smokeless tobacco. She reports that she does not drink alcohol or use illicit drugs.  The patient is accompanied by her daughter.  Allergies: No Known Allergies  Current Medications: Current Outpatient Prescriptions  Medication Sig Dispense Refill  . acetaminophen (TYLENOL) 500 MG tablet Take 500 mg by mouth every 6 (six) hours as needed for mild pain or moderate pain.     [name] Kitchen amLODipine (NORVASC) 5 MG tablet Take 1 tablet (5 mg total) by mouth daily. 90 tablet 1  . aspirin 81 MG tablet Take 81 mg by mouth every other day.    . Calcium Carbonate (CALTRATE 600 PO) Take 1,200 mg by mouth 2 (two) times daily.     . cholecalciferol (VITAMIN D) 400 UNITS TABS tablet Take 800 Units by mouth.    . Cyanocobalamin (VITAMIN B-12 IJ) Inject as directed once a week.    . diphenhydrAMINE (BENADRYL) 25 mg capsule Take 25 mg by mouth every 6 (six) hours as needed for allergies.    . ferrous sulfate 325 (65 FE) MG tablet Take 650 mg by mouth daily with breakfast.     . ibuprofen (ADVIL,MOTRIN) 400 MG tablet Take  1 tablet (400 mg total) by mouth every 4 (four) hours as needed for mild pain or moderate pain. 30 tablet 0  . lidocaine-prilocaine (EMLA) cream Apply 1 application topically as needed. Apply 1-2 hours prior to chemotherapy 30 g 1  . metFORMIN (GLUCOPHAGE) 500 MG tablet Take 1 tablet (500 mg total) by mouth 2 (two) times daily with a meal. 180 tablet 1  . metoprolol tartrate (LOPRESSOR) 25 MG tablet Take 1 tablet (25 mg total) by mouth 2 (two) times daily. 180 tablet 1  . ondansetron (ZOFRAN) 8 MG tablet Take 1 tablet (8 mg total) by mouth every 8 (eight) hours as needed for nausea or vomiting. 20 tablet 0  . oxyCODONE-acetaminophen (PERCOCET/ROXICET) 5-325 MG per tablet Take 1 tablet by mouth every 4 (four) hours as  needed for severe pain. 20 tablet 0  . doxycycline (DORYX) 100 MG EC tablet Take 1 tablet (100 mg total) by mouth 2 (two) times daily. (Patient not taking: Reported on 11/21/2014) 20 tablet 0  . doxycycline (VIBRA-TABS) 100 MG tablet Take 100 mg by mouth 2 (two) times daily.  0  . ONE TOUCH ULTRA TEST test strip   1   No current facility-administered medications for this visit.    Review of Systems:  GENERAL: Feels fine. No fevers or sweats. Weight stable. PERFORMANCE STATUS (ECOG): 1 HEENT: No visual changes, runny nose, sore throat, mouth sores or tenderness. Lungs: No shortness of breath or cough. No hemoptysis. Cardiac: No chest pain, palpitations, orthopnea, or PND. GI: No nausea, vomiting, diarrhea, constipation, melena or hematochezia. GU: No urgency, frequency, dysuria, or hematuria. Musculoskeletal: No back pain. No joint pain. No muscle tenderness. Extremities: No pain or swelling. Skin: Hair thinning.  Change in medial chest wall lesion. Neuro: Denies neuropathy.  No headache, numbness or weakness, balance or coordination issues. Endocrine: No diabetes, thyroid issues, hot flashes or night sweats. Psych: No mood changes, depression or anxiety. Pain: No focal pain. Review of systems: All other systems reviewed and found to be negative.  Physical Exam: Blood pressure 148/74, pulse 74, temperature 97.8 F (36.6 C), temperature source Tympanic, height 5' (1.524 m), weight 108 lb 11 oz (49.3 kg). GENERAL: Thin elderly woman sitting comfortably in the exam room in no acute distress. MENTAL STATUS: Alert and oriented to person, place and time. HEAD: Short gray hair. Normocephalic, atraumatic, face symmetric, no Cushingoid features. EYES: Blue eyes. Pupils equal round and reactive to light and accomodation. No conjunctivitis or scleral icterus. ENT: Oropharynx clear without lesion. Tongue normal. Mucous membranes moist.  RESPIRATORY: Clear to  auscultation without rales, wheezes or rhonchi. CARDIOVASCULAR: Regular rate and rhythm without murmur, rub or gallop. BREAST: Right mastectomy. Nodularity measuring 3.5 x 2 cm and 2.5 x 2 cm laterally.  Slight streaking away from incision with irregular jagged border.  No evidence of infection. ABDOMEN: Soft, non-tender, with active bowel sounds, and no hepatosplenomegaly. No masses. BACK: Kyphosis. No tenderness on palpation of back. SKIN: Slight rash upper back (no change).  Chest wall abnormalities as noted above. EXTREMITIES: No edema, no skin discoloration or tenderness. No palpable cords. LYMPH NODES: No palpable cervical, supraclavicular, axillary or inguinal adenopathy  NEUROLOGICAL: Unremarkable. PSYCH: Appropriate.   Infusion on 11/24/2014  Component Date Value Ref Range Status  . WBC 11/24/2014 3.0* 3.6 - 11.0 K/uL Final  . RBC 11/24/2014 3.87  3.80 - 5.20 MIL/uL Final  . Hemoglobin 11/24/2014 8.8* 12.0 - 16.0 g/dL Final  . HCT 11/24/2014 29.1* 35.0 - 47.0 % Final  .  MCV 11/24/2014 75.2* 80.0 - 100.0 fL Final  . MCH 11/24/2014 22.6* 26.0 - 34.0 pg Final  . MCHC 11/24/2014 30.1* 32.0 - 36.0 g/dL Final  . RDW 11/24/2014 21.5* 11.5 - 14.5 % Final  . Platelets 11/24/2014 381  150 - 440 K/uL Final  . Neutrophils Relative % 11/24/2014 66   Final  . Neutro Abs 11/24/2014 2.0  1.4 - 6.5 K/uL Final  . Lymphocytes Relative 11/24/2014 23   Final  . Lymphs Abs 11/24/2014 0.7* 1.0 - 3.6 K/uL Final  . Monocytes Relative 11/24/2014 8   Final  . Monocytes Absolute 11/24/2014 0.2  0.2 - 0.9 K/uL Final  . Eosinophils Relative 11/24/2014 1   Final  . Eosinophils Absolute 11/24/2014 0.0  0 - 0.7 K/uL Final  . Basophils Relative 11/24/2014 2   Final  . Basophils Absolute 11/24/2014 0.0  0 - 0.1 K/uL Final    Assessment:  Dawn Wiley is an 80 y.o. female with stage IIIC right breast cancer status post mastectomy and axillary lymph node dissection on 08/31/2014. 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 Carcinoma involved skeletal muscle and dermal lymphatics. Tumor was less than 0.5 mm from the deep margin. Thirteen of 15 lymph nodes were involved. The largest metastatic focus was 2 cm. Tumor is triple negative (ER negative, PR negative, and HER-2/neu negative). Pathologic stage was T3N3aMx.  PET scan on 09/21/2014 revealed postoperative changes in the right chest with no suspicious findings or residual tumor. Bone scan on 10/11/2014 revealed no evidence of metastatic disease.  Bone density study on 10/02/2014 revealed a T score of -4.7 in the forearm consistent with osteoporosis. T-score was less than 2.5 in the spine or hip. Echocardiogram on 10/02/2014 revealed ejection fraction of 60-65%.  She has iron deficiency anemia. Labs on 09/19/2014 revealed a hematocrit of 29.9, hemoglobin 8.7, MCV 69.7, ferritin 9, and TIBC 616. B12 was low (265) with a prior history of B12 deficiency and need for supplementation (stopped 06/2014). She began weekly B12 on 10/12/2014.  Folate was normal. Her diet is modest. She has never had a colonoscopy. She denies any melena or hematochezia.  She is tolerating 1 iron pill a day.  She is status post 3 weekly cycles of Taxol (11/03/2014 - 11/17/2014).  She has hair thinning.  She denies any nausea, vomiting, numbness or tingling.  Chest wall biopsy x 3 (lateral, medial, and middle sites) on 11/08/2014 revealed dermal lymphatic involvement by invasive mammary carcinoma with micropapillary features.  Plan: 1.  Review pathology from chest wall biopsies (performed after 1 cycle of Taxol).  Discuss plan to continue Taxol.  If progressive disease on Taxol, discuss initiation of adriamycin. 2.  Labs today:  CBC with diff, CMP. 3.  Week #4 Taxol today. 4.  RTC in 1 week for labs and week #5 Taxol 5.  Preauth adriamycin. 6.  Follow-up status of Venofer. 7.  B12  today then  begin monthly. 8.  Return to clinic in 2 weeks for M.D. assessment, labs (CBC with differential, CMP, magnesium), and week #6 Taxol.    [name], MD  11/24/2014, 9:23 AM

## 2014-11-24 NOTE — Progress Notes (Signed)
Pt here today for follow up regarding breast cancer; had 3 breast biopsies the week of July 18th with Dr. Rexene Edison and pt states they were all positive-saw him Tuesday for results; here to discuss treatment planning; tolerated chemo well; this is her 4th treatment today

## 2014-11-24 NOTE — Progress Notes (Signed)
[facility] day:  11/10/2014  Chief Complaint: Dawn Wiley is a 80 y.o. female with stage IIIC right breast cancer who is seen for assessment prior to week #2 Taxol.  HPI: The patient was last seen in the medical oncology clinic on 11/07/2014.  At that time, she was seen for sick call visit.  She had new changes on the right chest wall associated with her mastectomy site.  She was given a prescription for doxycycline over the weekend by the on-call provider.  Exam revealed no evidence of infection, but concern for recurrent disease.  She was referred to surgery.  She states that she had 3 biopsies at Dr. Melvenia Needles office.  She has a stitch in place.  She has follow-up during the first week in August.  Symptomatically, she is doing well. She denies any fevers.  She has a good appetite.  She is taking 1 iron pill a day without diarrhea.  Past Medical History  Diagnosis Date  . Kidney stones   . Hyperlipidemia   . Coronary artery disease     Coronary calcifications noted on CT scan  . Diabetes mellitus without complication   . Nephrolithiasis   . Hypertension   . Arthritis   . Anemia   . Tachycardia     Dr. Fletcher Anon, cardiologist  . Cancer     breast (right)  . PONV (postoperative nausea and vomiting)   . Rash     back  . Breast cancer     Past Surgical History  Procedure Laterality Date  . Temporomandibular joint surgery    . Cataract extraction      bilateral  . Breast surgery      breast biopsy X 3  . Eye surgery      bilateral cataract extraction  . Mastectomy modified radical Right 08/31/2014    Procedure: MASTECTOMY MODIFIED RADICAL;  Surgeon: Molly Maduro, MD;  Location: [facility] ORS;  Service: General;  Laterality: Right;  . Portacath placement Left 10/20/2014    Procedure: INSERTION PORT-A-CATH;  Surgeon: Marlyce Huge, MD;  Location: [facility] ORS;  Service: General;  Laterality: Left;    Family History  Problem  Relation Age of Onset  . Peripheral Artery Disease Sister     carotid artery stenosis   . Heart disease Sister   . Diabetes Sister     Social History:  reports that she has never smoked. She has never used smokeless tobacco. She reports that she does not drink alcohol or use illicit drugs.  The patient is accompanied by her daughter today.  Allergies: No Known Allergies  Current Medications: Current Outpatient Prescriptions  Medication Sig Dispense Refill  . acetaminophen (TYLENOL) 500 MG tablet Take 500 mg by mouth every 6 (six) hours as needed for mild pain or moderate pain.     [name] Kitchen amLODipine (NORVASC) 5 MG tablet Take 1 tablet (5 mg total) by mouth daily. 90 tablet 1  . aspirin 81 MG tablet Take 81 mg by mouth every other day.    . Calcium Carbonate (CALTRATE 600 PO) Take 1,200 mg by mouth 2 (two) times daily.     . cholecalciferol (VITAMIN D) 400 UNITS TABS tablet Take 800 Units by mouth.    . Cyanocobalamin (VITAMIN B-12 IJ) Inject as directed once a week.    . diphenhydrAMINE (BENADRYL) 25 mg capsule Take 25 mg by mouth every 6 (six) hours as needed for allergies.    [name] Kitchen doxycycline (DORYX) 100  MG EC tablet Take 1 tablet (100 mg total) by mouth 2 (two) times daily. (Patient not taking: Reported on 11/21/2014) 20 tablet 0  . doxycycline (VIBRA-TABS) 100 MG tablet Take 100 mg by mouth 2 (two) times daily.  0  . ferrous sulfate 325 (65 FE) MG tablet Take 650 mg by mouth daily with breakfast.     . ibuprofen (ADVIL,MOTRIN) 400 MG tablet Take 1 tablet (400 mg total) by mouth every 4 (four) hours as needed for mild pain or moderate pain. 30 tablet 0  . lidocaine-prilocaine (EMLA) cream Apply 1 application topically as needed. Apply 1-2 hours prior to chemotherapy 30 g 1  . metFORMIN (GLUCOPHAGE) 500 MG tablet Take 1 tablet (500 mg total) by mouth 2 (two) times daily with a meal. 180 tablet 1  . metoprolol tartrate (LOPRESSOR) 25 MG tablet Take 1 tablet (25 mg total) by mouth 2 (two) times  daily. 180 tablet 1  . ondansetron (ZOFRAN) 8 MG tablet Take 1 tablet (8 mg total) by mouth every 8 (eight) hours as needed for nausea or vomiting. 20 tablet 0  . oxyCODONE-acetaminophen (PERCOCET/ROXICET) 5-325 MG per tablet Take 1 tablet by mouth every 4 (four) hours as needed for severe pain. 20 tablet 0  . ONE TOUCH ULTRA TEST test strip   1   No current facility-administered medications for this visit.    Review of Systems:  GENERAL:  Doing well.  Feels "ok".  Active.  No fevers, sweats or weight loss. PERFORMANCE STATUS (ECOG):  1 HEENT:  No visual changes, runny nose, sore throat, mouth sores or tenderness. Lungs: No shortness of breath or cough.  No hemoptysis. Cardiac:  No chest pain, palpitations, orthopnea, or PND. GI:  No nausea, vomiting, diarrhea, constipation, melena or hematochezia. GU:  No urgency, frequency, dysuria, or hematuria. Musculoskeletal:  No back pain.  No joint pain.  No muscle tenderness. Extremities:  No pain or swelling. Skin:  No new chest wall changes. Neuro:  No headache, numbness or weakness, balance or coordination issues. Endocrine:  No diabetes, thyroid issues, hot flashes or night sweats. Psych:  No mood changes, depression or anxiety. Pain:  No focal pain. Review of systems:  All other systems reviewed and found to be negative.  Physical Exam: Blood pressure 149/68, pulse 80, temperature 96.7 F (35.9 C), temperature source Tympanic, height 5' 1"  (1.549 m), weight 109 lb 5.6 oz (49.6 kg). GENERAL:  Well developed, well nourished, sitting comfortably in the exam room in no acute distress. MENTAL STATUS:  Alert and oriented to person, place and time. HEAD:  Short gray hair.  Normocephalic, atraumatic, face symmetric, no Cushingoid features. EYES:  Blue eyes.  Pupils equal round and reactive to light and accomodation.  No conjunctivitis or scleral icterus. ENT:  Oropharynx clear without lesion.  Tongue normal. Mucous membranes moist.   RESPIRATORY:  Clear to auscultation without rales, wheezes or rhonchi. CARDIOVASCULAR:  Regular rate and rhythm without murmur, rub or gallop. BREAST: Right sided mastectomy incision with stable nodular areas.  No increased erythema or induration.  Small suture.  No new chest wall lesions. ABDOMEN:  Soft, non-tender, with active bowel sounds, and no hepatosplenomegaly.  No masses. SKIN:  No rashes, ulcers or lesions. EXTREMITIES: No edema, no skin discoloration or tenderness.  No palpable cords. LYMPH NODES: No palpable cervical, supraclavicular, axillary or inguinal adenopathy  NEUROLOGICAL: Unremarkable. PSYCH:  Appropriate.  Office Visit on 11/10/2014  Component Date Value Ref Range Status  . WBC 11/10/2014 3.4* 3.6 -  11.0 K/uL Final   A-LINE DRAW  . RBC 11/10/2014 3.93  3.80 - 5.20 MIL/uL Final  . Hemoglobin 11/10/2014 8.6* 12.0 - 16.0 g/dL Final  . HCT 11/10/2014 28.3* 35.0 - 47.0 % Final  . MCV 11/10/2014 72.1* 80.0 - 100.0 fL Final  . MCH 11/10/2014 21.8* 26.0 - 34.0 pg Final  . MCHC 11/10/2014 30.2* 32.0 - 36.0 g/dL Final  . RDW 11/10/2014 19.4* 11.5 - 14.5 % Final  . Platelets 11/10/2014 326  150 - 440 K/uL Final  . Neutrophils Relative % 11/10/2014 58   Final  . Neutro Abs 11/10/2014 1.9  1.4 - 6.5 K/uL Final  . Lymphocytes Relative 11/10/2014 28   Final  . Lymphs Abs 11/10/2014 0.9* 1.0 - 3.6 K/uL Final  . Monocytes Relative 11/10/2014 7   Final  . Monocytes Absolute 11/10/2014 0.2  0.2 - 0.9 K/uL Final  . Eosinophils Relative 11/10/2014 5   Final  . Eosinophils Absolute 11/10/2014 0.2  0 - 0.7 K/uL Final  . Basophils Relative 11/10/2014 2   Final  . Basophils Absolute 11/10/2014 0.1  0 - 0.1 K/uL Final  . Sodium 11/10/2014 137  135 - 145 mmol/L Final  . Potassium 11/10/2014 3.9  3.5 - 5.1 mmol/L Final  . Chloride 11/10/2014 104  101 - 111 mmol/L Final  . CO2 11/10/2014 24  22 - 32 mmol/L Final  . Glucose, Bld 11/10/2014 139* 65 - 99 mg/dL Final  . BUN 11/10/2014 17   6 - 20 mg/dL Final  . Creatinine, Ser 11/10/2014 0.60  0.44 - 1.00 mg/dL Final  . Calcium 11/10/2014 8.9  8.9 - 10.3 mg/dL Final  . Total Protein 11/10/2014 7.0  6.5 - 8.1 g/dL Final  . Albumin 11/10/2014 4.1  3.5 - 5.0 g/dL Final  . AST 11/10/2014 15  15 - 41 U/L Final  . ALT 11/10/2014 10* 14 - 54 U/L Final  . Alkaline Phosphatase 11/10/2014 55  38 - 126 U/L Final  . Total Bilirubin 11/10/2014 0.5  0.3 - 1.2 mg/dL Final  . GFR calc non Af Amer 11/10/2014 >60  >60 mL/min Final  . GFR calc Af Amer 11/10/2014 >60  >60 mL/min Final   Comment: (NOTE) The eGFR has been calculated using the CKD EPI equation. This calculation has not been validated in all clinical situations. eGFR's persistently <60 mL/min signify possible Chronic Kidney Disease.   . Anion gap 11/10/2014 9  5 - 15 Final    Assessment:  DEZIRAY NABI is a 80 y.o. female with stage IIIC right breast cancer status post mastectomy and axillary lymph node dissection on 08/31/2014. 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 Carcinoma involved skeletal muscle and dermal lymphatics. Tumor was less than 0.5 mm from the deep margin. Thirteen of 15 lymph nodes were involved. The largest metastatic focus was 2 cm. Tumor is triple negative (ER negative, PR negative, and HER-2/neu negative). Pathologic stage was [id number].  PET scan on 09/21/2014 revealed postoperative changes in the right chest with no suspicious findings or residual tumor. Bone scan on 10/11/2014 revealed no evidence of metastatic disease.  Bone density study on 10/02/2014 revealed a T score of -4.7 in the forearm consistent with osteoporosis. T-score was less than 2.5 in the spine or hip. Echocardiogram on 10/02/2014 revealed ejection fraction of 60-65%.  She has iron deficiency anemia. Labs on 09/19/2014 revealed a hematocrit of 29.9, hemoglobin 8.7, MCV 69.7, ferritin 9, and TIBC 616. B12 was low (  265)  with a prior history of B12 deficiency and need for supplementation (stopped 06/2014). She began B12 on 10/15/2014. Folate was normal. Her diet is modest. She has never had a colonoscopy. She denies any melena or hematochezia. She is able to tolerate 1 iron pill a day without diarrhea.  Hematocrit is stable.  She is status post week #1 Taxol  (began 11/03/2014). She is tolerating her chemotherapy well without side effect.  She underwent 3 biopsies on 11/08/2014 of evolving nodular changes medially and laterally associated with the right chest wall mastectomy incision worrisome for tumor growth. Exam is stable.  Plan: 1. Labs today:  CBC with diff, CMP 2. Follow-up surgical biopsy report. 3. Week #2 Taxol today. 4. B12 today. 5. Preauth Venofer (if unable to improve iron deficiency anemia on oral iron). 6. RTC in 1 week for labs, Taxol, and B12. 7. RTC in 2 weeks for MD assess, labs, and Taxol.   [name], MD  11/10/2014

## 2014-11-29 ENCOUNTER — Ambulatory Visit: Payer: PPO | Admitting: Surgery

## 2014-11-30 ENCOUNTER — Other Ambulatory Visit: Payer: Self-pay

## 2014-11-30 DIAGNOSIS — C50911 Malignant neoplasm of unspecified site of right female breast: Secondary | ICD-10-CM

## 2014-12-01 ENCOUNTER — Inpatient Hospital Stay: Payer: PPO | Attending: Hematology and Oncology

## 2014-12-01 ENCOUNTER — Inpatient Hospital Stay: Payer: PPO

## 2014-12-01 VITALS — BP 136/71 | HR 86

## 2014-12-01 DIAGNOSIS — E119 Type 2 diabetes mellitus without complications: Secondary | ICD-10-CM | POA: Insufficient documentation

## 2014-12-01 DIAGNOSIS — M129 Arthropathy, unspecified: Secondary | ICD-10-CM | POA: Insufficient documentation

## 2014-12-01 DIAGNOSIS — I10 Essential (primary) hypertension: Secondary | ICD-10-CM | POA: Diagnosis not present

## 2014-12-01 DIAGNOSIS — M810 Age-related osteoporosis without current pathological fracture: Secondary | ICD-10-CM | POA: Diagnosis not present

## 2014-12-01 DIAGNOSIS — Z7982 Long term (current) use of aspirin: Secondary | ICD-10-CM | POA: Diagnosis not present

## 2014-12-01 DIAGNOSIS — R197 Diarrhea, unspecified: Secondary | ICD-10-CM | POA: Diagnosis not present

## 2014-12-01 DIAGNOSIS — C50911 Malignant neoplasm of unspecified site of right female breast: Secondary | ICD-10-CM | POA: Diagnosis not present

## 2014-12-01 DIAGNOSIS — G629 Polyneuropathy, unspecified: Secondary | ICD-10-CM | POA: Diagnosis not present

## 2014-12-01 DIAGNOSIS — Z171 Estrogen receptor negative status [ER-]: Secondary | ICD-10-CM | POA: Insufficient documentation

## 2014-12-01 DIAGNOSIS — R000 Tachycardia, unspecified: Secondary | ICD-10-CM | POA: Diagnosis not present

## 2014-12-01 DIAGNOSIS — E538 Deficiency of other specified B group vitamins: Secondary | ICD-10-CM

## 2014-12-01 DIAGNOSIS — Z79899 Other long term (current) drug therapy: Secondary | ICD-10-CM | POA: Insufficient documentation

## 2014-12-01 DIAGNOSIS — Z87442 Personal history of urinary calculi: Secondary | ICD-10-CM | POA: Insufficient documentation

## 2014-12-01 DIAGNOSIS — I2510 Atherosclerotic heart disease of native coronary artery without angina pectoris: Secondary | ICD-10-CM | POA: Insufficient documentation

## 2014-12-01 DIAGNOSIS — Z5111 Encounter for antineoplastic chemotherapy: Secondary | ICD-10-CM | POA: Insufficient documentation

## 2014-12-01 DIAGNOSIS — D509 Iron deficiency anemia, unspecified: Secondary | ICD-10-CM | POA: Diagnosis not present

## 2014-12-01 DIAGNOSIS — E785 Hyperlipidemia, unspecified: Secondary | ICD-10-CM | POA: Diagnosis not present

## 2014-12-01 DIAGNOSIS — M79676 Pain in unspecified toe(s): Secondary | ICD-10-CM | POA: Diagnosis not present

## 2014-12-01 LAB — CBC WITH DIFFERENTIAL/PLATELET
Basophils Absolute: 0 10*3/uL (ref 0–0.1)
Basophils Relative: 2 %
Eosinophils Absolute: 0 10*3/uL (ref 0–0.7)
Eosinophils Relative: 1 %
HCT: 29.3 % — ABNORMAL LOW (ref 35.0–47.0)
Hemoglobin: 9.1 g/dL — ABNORMAL LOW (ref 12.0–16.0)
Lymphocytes Relative: 29 %
Lymphs Abs: 0.8 10*3/uL — ABNORMAL LOW (ref 1.0–3.6)
MCH: 23.3 pg — ABNORMAL LOW (ref 26.0–34.0)
MCHC: 31 g/dL — ABNORMAL LOW (ref 32.0–36.0)
MCV: 75.3 fL — ABNORMAL LOW (ref 80.0–100.0)
Monocytes Absolute: 0.3 10*3/uL (ref 0.2–0.9)
Monocytes Relative: 10 %
Neutro Abs: 1.5 10*3/uL (ref 1.4–6.5)
Neutrophils Relative %: 58 %
Platelets: 334 10*3/uL (ref 150–440)
RBC: 3.9 MIL/uL (ref 3.80–5.20)
RDW: 22.5 % — ABNORMAL HIGH (ref 11.5–14.5)
WBC: 2.7 10*3/uL — ABNORMAL LOW (ref 3.6–11.0)

## 2014-12-01 LAB — COMPREHENSIVE METABOLIC PANEL
ALT: 11 U/L — ABNORMAL LOW (ref 14–54)
AST: 18 U/L (ref 15–41)
Albumin: 4.1 g/dL (ref 3.5–5.0)
Alkaline Phosphatase: 47 U/L (ref 38–126)
Anion gap: 7 (ref 5–15)
BUN: 17 mg/dL (ref 6–20)
CO2: 22 mmol/L (ref 22–32)
Calcium: 8.8 mg/dL — ABNORMAL LOW (ref 8.9–10.3)
Chloride: 105 mmol/L (ref 101–111)
Creatinine, Ser: 0.52 mg/dL (ref 0.44–1.00)
GFR calc Af Amer: >60 mL/min (ref >60)
GFR calc non Af Amer: >60 mL/min (ref >60)
Glucose, Bld: 203 mg/dL — ABNORMAL HIGH (ref 65–99)
Potassium: 3.6 mmol/L (ref 3.5–5.1)
Sodium: 134 mmol/L — ABNORMAL LOW (ref 135–145)
Total Bilirubin: 0.4 mg/dL (ref 0.3–1.2)
Total Protein: 6.9 g/dL (ref 6.5–8.1)

## 2014-12-01 LAB — MAGNESIUM: Magnesium: 1.8 mg/dL (ref 1.7–2.4)

## 2014-12-01 MED ORDER — HEPARIN SOD (PORK) LOCK FLUSH 100 UNIT/ML IV SOLN: 500.0000 [IU] | Freq: Once | INTRAVENOUS | Status: DC | PRN

## 2014-12-01 MED ORDER — SODIUM CHLORIDE 0.9 % IV SOLN
Freq: Once | INTRAVENOUS | Status: AC
  Administered 2014-12-01: 10:00:00 via INTRAVENOUS
  Filled 2014-12-01: qty 1000

## 2014-12-01 MED ORDER — SODIUM CHLORIDE 0.9 % IJ SOLN
10.0000 mL | INTRAMUSCULAR | Status: DC | PRN
  Administered 2014-12-01: 10 mL via INTRAVENOUS
  Filled 2014-12-01: qty 10

## 2014-12-01 MED ORDER — SODIUM CHLORIDE 0.9 % IV SOLN
Freq: Once | INTRAVENOUS | Status: AC
  Administered 2014-12-01: 11:00:00 via INTRAVENOUS
  Filled 2014-12-01: qty 4

## 2014-12-01 MED ORDER — FAMOTIDINE IN NACL 20-0.9 MG/50ML-% IV SOLN
20.0000 mg | Freq: Once | INTRAVENOUS | Status: AC
  Administered 2014-12-01: 20 mg via INTRAVENOUS
  Filled 2014-12-01: qty 50

## 2014-12-01 MED ORDER — HEPARIN SOD (PORK) LOCK FLUSH 100 UNIT/ML IV SOLN
500.0000 [IU] | Freq: Once | INTRAVENOUS | Status: AC
  Administered 2014-12-01: 500 [IU] via INTRAVENOUS
  Filled 2014-12-01: qty 5

## 2014-12-01 MED ORDER — PACLITAXEL CHEMO INJECTION 300 MG/50ML
80.0000 mg/m2 | Freq: Once | INTRAVENOUS | Status: AC
  Administered 2014-12-01: 114 mg via INTRAVENOUS
  Filled 2014-12-01: qty 19

## 2014-12-01 MED ORDER — DIPHENHYDRAMINE HCL 50 MG/ML IJ SOLN
50.0000 mg | Freq: Once | INTRAMUSCULAR | Status: AC
  Administered 2014-12-01: 50 mg via INTRAVENOUS
  Filled 2014-12-01: qty 1

## 2014-12-08 ENCOUNTER — Inpatient Hospital Stay (HOSPITAL_BASED_OUTPATIENT_CLINIC_OR_DEPARTMENT_OTHER): Payer: PPO | Admitting: Hematology and Oncology

## 2014-12-08 ENCOUNTER — Other Ambulatory Visit: Payer: Self-pay

## 2014-12-08 ENCOUNTER — Inpatient Hospital Stay: Payer: PPO

## 2014-12-08 VITALS — BP 105/74 | HR 85 | Temp 95.3°F | Resp 18 | Wt 110.9 lb

## 2014-12-08 DIAGNOSIS — D509 Iron deficiency anemia, unspecified: Secondary | ICD-10-CM

## 2014-12-08 DIAGNOSIS — Z171 Estrogen receptor negative status [ER-]: Secondary | ICD-10-CM | POA: Diagnosis not present

## 2014-12-08 DIAGNOSIS — R000 Tachycardia, unspecified: Secondary | ICD-10-CM

## 2014-12-08 DIAGNOSIS — M129 Arthropathy, unspecified: Secondary | ICD-10-CM

## 2014-12-08 DIAGNOSIS — M79676 Pain in unspecified toe(s): Secondary | ICD-10-CM

## 2014-12-08 DIAGNOSIS — I10 Essential (primary) hypertension: Secondary | ICD-10-CM

## 2014-12-08 DIAGNOSIS — Z87442 Personal history of urinary calculi: Secondary | ICD-10-CM

## 2014-12-08 DIAGNOSIS — M810 Age-related osteoporosis without current pathological fracture: Secondary | ICD-10-CM

## 2014-12-08 DIAGNOSIS — Z7982 Long term (current) use of aspirin: Secondary | ICD-10-CM

## 2014-12-08 DIAGNOSIS — C50911 Malignant neoplasm of unspecified site of right female breast: Secondary | ICD-10-CM

## 2014-12-08 DIAGNOSIS — E538 Deficiency of other specified B group vitamins: Secondary | ICD-10-CM

## 2014-12-08 DIAGNOSIS — I2510 Atherosclerotic heart disease of native coronary artery without angina pectoris: Secondary | ICD-10-CM

## 2014-12-08 DIAGNOSIS — E785 Hyperlipidemia, unspecified: Secondary | ICD-10-CM

## 2014-12-08 DIAGNOSIS — Z79899 Other long term (current) drug therapy: Secondary | ICD-10-CM

## 2014-12-08 DIAGNOSIS — R197 Diarrhea, unspecified: Secondary | ICD-10-CM

## 2014-12-08 DIAGNOSIS — E119 Type 2 diabetes mellitus without complications: Secondary | ICD-10-CM

## 2014-12-08 DIAGNOSIS — Z5111 Encounter for antineoplastic chemotherapy: Secondary | ICD-10-CM | POA: Diagnosis not present

## 2014-12-08 LAB — CBC WITH DIFFERENTIAL/PLATELET
Basophils Absolute: 0 10*3/uL (ref 0–0.1)
Basophils Relative: 1 %
Eosinophils Absolute: 0 10*3/uL (ref 0–0.7)
Eosinophils Relative: 1 %
HCT: 29.1 % — ABNORMAL LOW (ref 35.0–47.0)
Hemoglobin: 9.1 g/dL — ABNORMAL LOW (ref 12.0–16.0)
Lymphocytes Relative: 25 %
Lymphs Abs: 0.8 10*3/uL — ABNORMAL LOW (ref 1.0–3.6)
MCH: 24.2 pg — ABNORMAL LOW (ref 26.0–34.0)
MCHC: 31.2 g/dL — ABNORMAL LOW (ref 32.0–36.0)
MCV: 77.4 fL — ABNORMAL LOW (ref 80.0–100.0)
Monocytes Absolute: 0.4 10*3/uL (ref 0.2–0.9)
Monocytes Relative: 13 %
Neutro Abs: 1.8 10*3/uL (ref 1.4–6.5)
Neutrophils Relative %: 60 %
Platelets: 337 10*3/uL (ref 150–440)
RBC: 3.75 MIL/uL — ABNORMAL LOW (ref 3.80–5.20)
RDW: 25.5 % — ABNORMAL HIGH (ref 11.5–14.5)
WBC: 3 10*3/uL — ABNORMAL LOW (ref 3.6–11.0)

## 2014-12-08 LAB — COMPREHENSIVE METABOLIC PANEL
ALT: 10 U/L — ABNORMAL LOW (ref 14–54)
AST: 16 U/L (ref 15–41)
Albumin: 4 g/dL (ref 3.5–5.0)
Alkaline Phosphatase: 45 U/L (ref 38–126)
Anion gap: 5 (ref 5–15)
BUN: 16 mg/dL (ref 6–20)
CO2: 22 mmol/L (ref 22–32)
Calcium: 9.2 mg/dL (ref 8.9–10.3)
Chloride: 109 mmol/L (ref 101–111)
Creatinine, Ser: 0.47 mg/dL (ref 0.44–1.00)
GFR calc Af Amer: >60 mL/min (ref >60)
GFR calc non Af Amer: >60 mL/min (ref >60)
Glucose, Bld: 173 mg/dL — ABNORMAL HIGH (ref 65–99)
Potassium: 3.5 mmol/L (ref 3.5–5.1)
Sodium: 136 mmol/L (ref 135–145)
Total Bilirubin: 0.4 mg/dL (ref 0.3–1.2)
Total Protein: 6.6 g/dL (ref 6.5–8.1)

## 2014-12-08 LAB — MAGNESIUM: Magnesium: 1.9 mg/dL (ref 1.7–2.4)

## 2014-12-08 MED ORDER — HEPARIN SOD (PORK) LOCK FLUSH 100 UNIT/ML IV SOLN
INTRAVENOUS | Status: AC
  Filled 2014-12-08: qty 5

## 2014-12-08 MED ORDER — SODIUM CHLORIDE 0.9 % IJ SOLN
10.0000 mL | Freq: Once | INTRAMUSCULAR | Status: AC
  Administered 2014-12-08: 10 mL via INTRAVENOUS
  Filled 2014-12-08: qty 10

## 2014-12-08 MED ORDER — HEPARIN SOD (PORK) LOCK FLUSH 100 UNIT/ML IV SOLN
500.0000 [IU] | Freq: Once | INTRAVENOUS | Status: AC
  Administered 2014-12-08: 500 [IU] via INTRAVENOUS

## 2014-12-08 NOTE — Progress Notes (Signed)
[facility]-  Cancer Center  Clinic day:  12/08/2014  Chief Complaint: Dawn Wiley is an 80 y.o. female with stage IIIC right breast cancer who is seen for assessment prior to week #6 Taxol.  HPI: The patient was last seen in the medical oncology clinic on 11/24/2014.  At that time, she received week #4 Taxol.  She was tolerating her chemotherapy well.  Punch biopsy sites from the right chest wall returned positive for dermal lymphatic involvement by invasive breast cancer.  We discuss continuation of Taxol with close monitoring as the biopsy was taken 1 week after the start of chemotherapy.  During the interim, she feels that the medial lesion is bigger.  Her daughter feels that the streaking has increased in the surrounding skin.  Her toes feel "a little burning".  She can not take 2 iron pills a day because of diarrhea.  Past Medical History  Diagnosis Date  . Kidney stones   . Hyperlipidemia   . Coronary artery disease     Coronary calcifications noted on CT scan  . Diabetes mellitus without complication   . Nephrolithiasis   . Hypertension   . Arthritis   . Anemia   . Tachycardia     Dr. Arida, cardiologist  . Cancer     breast (right)  . PONV (postoperative nausea and vomiting)   . Rash     back  . Breast cancer     Past Surgical History  Procedure Laterality Date  . Temporomandibular joint surgery    . Cataract extraction      bilateral  . Breast surgery      breast biopsy X 3  . Eye surgery      bilateral cataract extraction  . Mastectomy modified radical Right 08/31/2014    Procedure: MASTECTOMY MODIFIED RADICAL;  Surgeon: William Marterre, MD;  Location: [facility] ORS;  Service: General;  Laterality: Right;  . Portacath placement Left 10/20/2014    Procedure: INSERTION PORT-A-CATH;  Surgeon: Christopher Lundquist, MD;  Location: [facility] ORS;  Service: General;  Laterality: Left;    Family History  Problem Relation Age of Onset  . Peripheral  Artery Disease Sister     carotid artery stenosis   . Heart disease Sister   . Diabetes Sister     Social History:  reports that she has never smoked. She has never used smokeless tobacco. She reports that she does not drink alcohol or use illicit drugs.  The patient is accompanied by her daughter.  Allergies: No Known Allergies  Current Medications: Current Outpatient Prescriptions  Medication Sig Dispense Refill  . acetaminophen (TYLENOL) 500 MG tablet Take 500 mg by mouth every 6 (six) hours as needed for mild pain or moderate pain.     . amLODipine (NORVASC) 5 MG tablet Take 1 tablet (5 mg total) by mouth daily. 90 tablet 1  . aspirin 81 MG tablet Take 81 mg by mouth every other day.    . Calcium Carbonate (CALTRATE 600 PO) Take 1,200 mg by mouth 2 (two) times daily.     . cholecalciferol (VITAMIN D) 400 UNITS TABS tablet Take 800 Units by mouth.    . Cyanocobalamin (VITAMIN B-12 IJ) Inject as directed once a week.    . diphenhydrAMINE (BENADRYL) 25 mg capsule Take 25 mg by mouth every 6 (six) hours as needed for allergies.    . ferrous sulfate 325 (65 FE) MG tablet Take 650 mg by mouth daily with breakfast.     .   ibuprofen (ADVIL,MOTRIN) 400 MG tablet Take 1 tablet (400 mg total) by mouth every 4 (four) hours as needed for mild pain or moderate pain. 30 tablet 0  . lidocaine-prilocaine (EMLA) cream Apply 1 application topically as needed. Apply 1-2 hours prior to chemotherapy 30 g 1  . metFORMIN (GLUCOPHAGE) 500 MG tablet Take 1 tablet (500 mg total) by mouth 2 (two) times daily with a meal. 180 tablet 1  . metoprolol tartrate (LOPRESSOR) 25 MG tablet Take 1 tablet (25 mg total) by mouth 2 (two) times daily. 180 tablet 1  . ONE TOUCH ULTRA TEST test strip   1  . oxyCODONE-acetaminophen (PERCOCET/ROXICET) 5-325 MG per tablet Take 1 tablet by mouth every 4 (four) hours as needed for severe pain. 20 tablet 0  . doxycycline (DORYX) 100 MG EC tablet Take 1 tablet (100 mg total) by mouth 2  (two) times daily. (Patient not taking: Reported on 12/08/2014) 20 tablet 0  . doxycycline (VIBRA-TABS) 100 MG tablet Take 100 mg by mouth 2 (two) times daily.  0  . ondansetron (ZOFRAN) 8 MG tablet Take 1 tablet (8 mg total) by mouth every 8 (eight) hours as needed for nausea or vomiting. 20 tablet 0   No current facility-administered medications for this visit.    Review of Systems:  GENERAL: Feels "ok". No fevers or sweats. Weight stable. PERFORMANCE STATUS (ECOG): 1-2 HEENT:No visual changes, runny nose, sore throat, mouth sores or tenderness. Lungs: No shortness of breath or cough. No hemoptysis. Cardiac: No chest pain, palpitations, orthopnea, or PND. GI: No nausea, vomiting, diarrhea, constipation, melena or hematochezia. GU: No urgency, frequency, dysuria, or hematuria. Musculoskeletal: No back pain. No joint pain. No muscle tenderness. Extremities: No pain or swelling. Skin: Feels like medial chest wall lesion is growing.  Increased streaking in surrounding skin. Neuro: Toes burn a little.  No headache, numbness or weakness, balance or coordination issues. Endocrine: No diabetes, thyroid issues, hot flashes or night sweats. Psych: No mood changes, depression or anxiety. Pain: No focal pain. Review of systems: All other systems reviewed and found to be negative.  Physical Exam: Blood pressure 105/74, pulse 85, temperature 95.3 F (35.2 C), temperature source Tympanic, resp. rate 18, weight 110 lb 14.3 oz (50.3 kg). GENERAL: Thin elderly woman sitting comfortably in the exam room in no acute distress. MENTAL STATUS: Alert and oriented to person, place and time. HEAD: Short gray wig. Normocephalic, atraumatic, face symmetric, no Cushingoid features. EYES: Blue eyes. Pupils equal round and reactive to light and accomodation. No conjunctivitis or scleral icterus. ENT: Oropharynx clear without lesion. Tongue normal. Mucous membranes moist.  RESPIRATORY:  Clear to auscultation without rales, wheezes or rhonchi. CARDIOVASCULAR: Regular rate and rhythm without murmur, rub or gallop. BREAST: Right mastectomy. Nodularity measuring 4 x 3.5 cm medially and 3 x 2 cm laterally.  Irregular border streaking has extended cranially.  5 mm nodular area above incision between 2 larger nodules. ABDOMEN: Soft, non-tender, with active bowel sounds, and no hepatosplenomegaly. No masses. BACK: Kyphosis. SKIN: Chest wall lesions as above.  Upper back resolved. EXTREMITIES: No edema, no skin discoloration or tenderness. No palpable cords. LYMPH NODES: No palpable cervical, supraclavicular, axillary or inguinal adenopathy  NEUROLOGICAL: Unremarkable. PSYCH: Appropriate.   Infusion on 12/08/2014  Component Date Value Ref Range Status  . WBC 12/08/2014 3.0* 3.6 - 11.0 K/uL Final   A-LINE DRAW  . RBC 12/08/2014 3.75* 3.80 - 5.20 MIL/uL Final  . Hemoglobin 12/08/2014 9.1* 12.0 - 16.0 g/dL Final  .   HCT 12/08/2014 29.1* 35.0 - 47.0 % Final  . MCV 12/08/2014 77.4* 80.0 - 100.0 fL Final  . MCH 12/08/2014 24.2* 26.0 - 34.0 pg Final  . MCHC 12/08/2014 31.2* 32.0 - 36.0 g/dL Final  . RDW 12/08/2014 25.5* 11.5 - 14.5 % Final  . Platelets 12/08/2014 337  150 - 440 K/uL Final  . Neutrophils Relative % 12/08/2014 60   Final  . Neutro Abs 12/08/2014 1.8  1.4 - 6.5 K/uL Final  . Lymphocytes Relative 12/08/2014 25   Final  . Lymphs Abs 12/08/2014 0.8* 1.0 - 3.6 K/uL Final  . Monocytes Relative 12/08/2014 13   Final  . Monocytes Absolute 12/08/2014 0.4  0.2 - 0.9 K/uL Final  . Eosinophils Relative 12/08/2014 1   Final  . Eosinophils Absolute 12/08/2014 0.0  0 - 0.7 K/uL Final  . Basophils Relative 12/08/2014 1   Final  . Basophils Absolute 12/08/2014 0.0  0 - 0.1 K/uL Final  . Sodium 12/08/2014 136  135 - 145 mmol/L Final  . Potassium 12/08/2014 3.5  3.5 - 5.1 mmol/L Final  . Chloride 12/08/2014 109  101 - 111 mmol/L Final  . CO2 12/08/2014 22  22 - 32 mmol/L  Final  . Glucose, Bld 12/08/2014 173* 65 - 99 mg/dL Final  . BUN 12/08/2014 16  6 - 20 mg/dL Final  . Creatinine, Ser 12/08/2014 0.47  0.44 - 1.00 mg/dL Final  . Calcium 12/08/2014 9.2  8.9 - 10.3 mg/dL Final  . Total Protein 12/08/2014 6.6  6.5 - 8.1 g/dL Final  . Albumin 12/08/2014 4.0  3.5 - 5.0 g/dL Final  . AST 12/08/2014 16  15 - 41 U/L Final  . ALT 12/08/2014 10* 14 - 54 U/L Final  . Alkaline Phosphatase 12/08/2014 45  38 - 126 U/L Final  . Total Bilirubin 12/08/2014 0.4  0.3 - 1.2 mg/dL Final  . GFR calc non Af Amer 12/08/2014 >60  >60 mL/min Final  . GFR calc Af Amer 12/08/2014 >60  >60 mL/min Final   Comment: (NOTE) The eGFR has been calculated using the CKD EPI equation. This calculation has not been validated in all clinical situations. eGFR's persistently <60 mL/min signify possible Chronic Kidney Disease.   . Anion gap 12/08/2014 5  5 - 15 Final  . Magnesium 12/08/2014 1.9  1.7 - 2.4 mg/dL Final    Assessment:  Torry J Gerdts is an 80 y.o. female with stage IIIC right breast cancer status post mastectomy and axillary lymph node dissection on 08/31/2014. 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 Carcinoma involved skeletal muscle and dermal lymphatics. Tumor was less than 0.5 mm from the deep margin. Thirteen of 15 lymph nodes were involved. The largest metastatic focus was 2 cm. Tumor is triple negative (ER negative, PR negative, and HER-2/neu negative). Pathologic stage was [id number].  PET scan on 09/21/2014 revealed postoperative changes in the right chest with no suspicious findings or residual tumor. Bone scan on 10/11/2014 revealed no evidence of metastatic disease.  Bone density study on 10/02/2014 revealed a T score of -4.7 in the forearm consistent with osteoporosis. T-score was less than 2.5 in the spine or hip. Echocardiogram on 10/02/2014 revealed ejection fraction of 60-65%.  She has iron  deficiency anemia. Labs on 09/19/2014 revealed a hematocrit of 29.9, hemoglobin 8.7, MCV 69.7, ferritin 9, and TIBC 616. B12 was low (265) with a prior history of B12 deficiency and need for supplementation (stopped 06/2014). She began weekly B12   on 10/12/2014.  Folate was normal. Her diet is modest. She has never had a colonoscopy. She denies any melena or hematochezia.  She continues to tolerate 1 iron pill a day.  She is eating red meat.  Hematocrit is stable to slightly improved.  She is status post 5 weekly cycles of Taxol (11/03/2014 - 12/01/2014).  She has a grade I neuropathy.  Chest wall biopsy x 3 (lateral, medial, and middle sites) on 11/08/2014 revealed dermal lymphatic involvement by invasive mammary carcinoma with micropapillary features.  There has been clear progression of chest wall disease within the past 2 weeks.  Plan: 1.  Labs today:  CBC with diff, CMP, Mg. 2.  Medical photography of chest wall lesions. 3.  Discontinue Taxol. 4.  Discuss adriamycin.  Side effects reviewed.  Patient agreeable to weekly dosing schedule. 5.  Confirm preauthorization of adriamycin.   6.  Continue oral iron. 7.  RTC for MD assess, labs (CBC with diff, CMP), and week #1 adriamycin.   [name] C Corcoran, MD  12/08/2014, 10:05 AM   

## 2014-12-09 ENCOUNTER — Encounter: Payer: Self-pay | Admitting: Hematology and Oncology

## 2014-12-12 ENCOUNTER — Telehealth: Payer: Self-pay | Admitting: *Deleted

## 2014-12-12 NOTE — Telephone Encounter (Signed)
Patients daughter called with concerns that her mom's next chemo drug was not approved yet, and there is no appointment scheduled.  Discussed with preauthorization clerk and the drug is now approved.  Inbasket sent to MD to ask for next treatment date.  Rhonda to call me back tomorrow.

## 2014-12-14 ENCOUNTER — Inpatient Hospital Stay: Payer: PPO

## 2014-12-14 ENCOUNTER — Other Ambulatory Visit: Payer: Self-pay

## 2014-12-14 ENCOUNTER — Encounter: Payer: Self-pay | Admitting: Hematology and Oncology

## 2014-12-14 ENCOUNTER — Inpatient Hospital Stay (HOSPITAL_BASED_OUTPATIENT_CLINIC_OR_DEPARTMENT_OTHER): Payer: PPO | Admitting: Hematology and Oncology

## 2014-12-14 VITALS — BP 157/68 | HR 76 | Temp 98.1°F | Resp 18 | Wt 113.5 lb

## 2014-12-14 DIAGNOSIS — R000 Tachycardia, unspecified: Secondary | ICD-10-CM

## 2014-12-14 DIAGNOSIS — G629 Polyneuropathy, unspecified: Secondary | ICD-10-CM | POA: Diagnosis not present

## 2014-12-14 DIAGNOSIS — C50911 Malignant neoplasm of unspecified site of right female breast: Secondary | ICD-10-CM

## 2014-12-14 DIAGNOSIS — M810 Age-related osteoporosis without current pathological fracture: Secondary | ICD-10-CM

## 2014-12-14 DIAGNOSIS — Z171 Estrogen receptor negative status [ER-]: Secondary | ICD-10-CM

## 2014-12-14 DIAGNOSIS — R197 Diarrhea, unspecified: Secondary | ICD-10-CM

## 2014-12-14 DIAGNOSIS — E785 Hyperlipidemia, unspecified: Secondary | ICD-10-CM

## 2014-12-14 DIAGNOSIS — Z5111 Encounter for antineoplastic chemotherapy: Secondary | ICD-10-CM | POA: Diagnosis not present

## 2014-12-14 DIAGNOSIS — Z7982 Long term (current) use of aspirin: Secondary | ICD-10-CM

## 2014-12-14 DIAGNOSIS — E538 Deficiency of other specified B group vitamins: Secondary | ICD-10-CM

## 2014-12-14 DIAGNOSIS — I2510 Atherosclerotic heart disease of native coronary artery without angina pectoris: Secondary | ICD-10-CM

## 2014-12-14 DIAGNOSIS — M129 Arthropathy, unspecified: Secondary | ICD-10-CM

## 2014-12-14 DIAGNOSIS — Z87442 Personal history of urinary calculi: Secondary | ICD-10-CM

## 2014-12-14 DIAGNOSIS — D509 Iron deficiency anemia, unspecified: Secondary | ICD-10-CM | POA: Diagnosis not present

## 2014-12-14 DIAGNOSIS — E119 Type 2 diabetes mellitus without complications: Secondary | ICD-10-CM

## 2014-12-14 DIAGNOSIS — I10 Essential (primary) hypertension: Secondary | ICD-10-CM

## 2014-12-14 DIAGNOSIS — M79676 Pain in unspecified toe(s): Secondary | ICD-10-CM

## 2014-12-14 DIAGNOSIS — Z79899 Other long term (current) drug therapy: Secondary | ICD-10-CM

## 2014-12-14 LAB — COMPREHENSIVE METABOLIC PANEL
ALT: 12 U/L — ABNORMAL LOW (ref 14–54)
AST: 18 U/L (ref 15–41)
Albumin: 4.1 g/dL (ref 3.5–5.0)
Alkaline Phosphatase: 57 U/L (ref 38–126)
Anion gap: 8 (ref 5–15)
BUN: 20 mg/dL (ref 6–20)
CO2: 23 mmol/L (ref 22–32)
Calcium: 9.1 mg/dL (ref 8.9–10.3)
Chloride: 107 mmol/L (ref 101–111)
Creatinine, Ser: 0.51 mg/dL (ref 0.44–1.00)
GFR calc Af Amer: >60 mL/min (ref >60)
GFR calc non Af Amer: >60 mL/min (ref >60)
Glucose, Bld: 165 mg/dL — ABNORMAL HIGH (ref 65–99)
Potassium: 4.2 mmol/L (ref 3.5–5.1)
Sodium: 138 mmol/L (ref 135–145)
Total Bilirubin: 0.3 mg/dL (ref 0.3–1.2)
Total Protein: 6.9 g/dL (ref 6.5–8.1)

## 2014-12-14 LAB — CBC WITH DIFFERENTIAL/PLATELET
Basophils Absolute: 0.1 10*3/uL (ref 0–0.1)
Basophils Relative: 2 %
Eosinophils Absolute: 0.1 10*3/uL (ref 0–0.7)
Eosinophils Relative: 1 %
HCT: 29.4 % — ABNORMAL LOW (ref 35.0–47.0)
Hemoglobin: 9.5 g/dL — ABNORMAL LOW (ref 12.0–16.0)
Lymphocytes Relative: 23 %
Lymphs Abs: 1 10*3/uL (ref 1.0–3.6)
MCH: 24.7 pg — ABNORMAL LOW (ref 26.0–34.0)
MCHC: 32.2 g/dL (ref 32.0–36.0)
MCV: 76.7 fL — ABNORMAL LOW (ref 80.0–100.0)
Monocytes Absolute: 0.7 10*3/uL (ref 0.2–0.9)
Monocytes Relative: 16 %
Neutro Abs: 2.5 10*3/uL (ref 1.4–6.5)
Neutrophils Relative %: 58 %
Platelets: 339 10*3/uL (ref 150–440)
RBC: 3.83 MIL/uL (ref 3.80–5.20)
RDW: 25 % — ABNORMAL HIGH (ref 11.5–14.5)
WBC: 4.3 10*3/uL (ref 3.6–11.0)

## 2014-12-14 MED ORDER — SODIUM CHLORIDE 0.9 % IJ SOLN
10.0000 mL | INTRAMUSCULAR | Status: DC | PRN
  Administered 2014-12-14: 10 mL via INTRAVENOUS
  Filled 2014-12-14: qty 10

## 2014-12-14 MED ORDER — SODIUM CHLORIDE 0.9 % IV SOLN
Freq: Once | INTRAVENOUS | Status: AC
  Administered 2014-12-14: 11:00:00 via INTRAVENOUS
  Filled 2014-12-14: qty 8

## 2014-12-14 MED ORDER — LORAZEPAM 0.5 MG PO TABS: 0.5000 mg | ORAL_TABLET | Freq: Four times a day (QID) | ORAL | Status: DC | PRN

## 2014-12-14 MED ORDER — HEPARIN SOD (PORK) LOCK FLUSH 100 UNIT/ML IV SOLN
500.0000 [IU] | Freq: Once | INTRAVENOUS | Status: AC
  Administered 2014-12-14: 500 [IU] via INTRAVENOUS
  Filled 2014-12-14: qty 5

## 2014-12-14 MED ORDER — SODIUM CHLORIDE 0.9 % IV SOLN
Freq: Once | INTRAVENOUS | Status: AC
  Administered 2014-12-14: 11:00:00 via INTRAVENOUS
  Filled 2014-12-14: qty 1000

## 2014-12-14 MED ORDER — DOXORUBICIN HCL CHEMO IV INJECTION 2 MG/ML
20.0000 mg/m2 | Freq: Once | INTRAVENOUS | Status: AC
  Administered 2014-12-14: 30 mg via INTRAVENOUS
  Filled 2014-12-14: qty 15

## 2014-12-14 NOTE — Progress Notes (Signed)
[facility] day:  12/14/2014  Chief Complaint: Dawn Wiley is an 80 y.o. female with stage IIIC right breast cancer who is seen for assessment prior to initiation of weekly adriamycin.  HPI: The patient was last seen in the medical oncology clinic on 12/08/2014.  At that time, she was scheduled to receive week #6 Taxol. She had a grade I neuropathy.  Since her last visit, chest wall lesions had increased in size.  Because of progressive disease, Taxol was discontinued.  We discussed other options.  Chemotherapy consisting of adriamycin was discussed.  As she was not interested in intensive chemotherapy, we discussed a trial of weekly adriamycin.  During the interim, she has felt good.  She states that her chest wall "doesn't look good".  She states that her neck is "doing all right" and was probably bothered by "the way I was lying in bed".  She denies any bone pain.   Past Medical History  Diagnosis Date  . Kidney stones   . Hyperlipidemia   . Coronary artery disease     Coronary calcifications noted on CT scan  . Diabetes mellitus without complication   . Nephrolithiasis   . Hypertension   . Arthritis   . Anemia   . Tachycardia     Dr. Fletcher Anon, cardiologist  . Cancer     breast (right)  . PONV (postoperative nausea and vomiting)   . Rash     back  . Breast cancer     Past Surgical History  Procedure Laterality Date  . Temporomandibular joint surgery    . Cataract extraction      bilateral  . Breast surgery      breast biopsy X 3  . Eye surgery      bilateral cataract extraction  . Mastectomy modified radical Right 08/31/2014    Procedure: MASTECTOMY MODIFIED RADICAL;  Surgeon: Molly Maduro, MD;  Location: [facility] ORS;  Service: General;  Laterality: Right;  . Portacath placement Left 10/20/2014    Procedure: INSERTION PORT-A-CATH;  Surgeon: Marlyce Huge, MD;  Location: [facility] ORS;  Service: General;  Laterality: Left;     Family History  Problem Relation Age of Onset  . Peripheral Artery Disease Sister     carotid artery stenosis   . Heart disease Sister   . Diabetes Sister     Social History:  reports that she has never smoked. She has never used smokeless tobacco. She reports that she does not drink alcohol or use illicit drugs.  The patient is accompanied by her daughter.  Allergies: No Known Allergies  Current Medications: Current Outpatient Prescriptions  Medication Sig Dispense Refill  . acetaminophen (TYLENOL) 500 MG tablet Take 500 mg by mouth every 6 (six) hours as needed for mild pain or moderate pain.     [name] Kitchen amLODipine (NORVASC) 5 MG tablet Take 1 tablet (5 mg total) by mouth daily. 90 tablet 1  . aspirin 81 MG tablet Take 81 mg by mouth every other day.    . Calcium Carbonate (CALTRATE 600 PO) Take 1,200 mg by mouth daily.     . cholecalciferol (VITAMIN D) 400 UNITS TABS tablet Take 800 Units by mouth.    . Cyanocobalamin (VITAMIN B-12 IJ) Inject as directed every 30 (thirty) days.     . diphenhydrAMINE (BENADRYL) 25 mg capsule Take 25 mg by mouth every 6 (six) hours as needed for allergies.    . ferrous sulfate 325 (65 FE) MG  tablet Take 325 mg by mouth daily with breakfast.     . ibuprofen (ADVIL,MOTRIN) 400 MG tablet Take 1 tablet (400 mg total) by mouth every 4 (four) hours as needed for mild pain or moderate pain. 30 tablet 0  . lidocaine-prilocaine (EMLA) cream Apply 1 application topically as needed. Apply 1-2 hours prior to chemotherapy 30 g 1  . metFORMIN (GLUCOPHAGE) 500 MG tablet Take 1 tablet (500 mg total) by mouth 2 (two) times daily with a meal. 180 tablet 1  . metoprolol tartrate (LOPRESSOR) 25 MG tablet Take 1 tablet (25 mg total) by mouth 2 (two) times daily. 180 tablet 1  . ondansetron (ZOFRAN) 8 MG tablet Take 1 tablet (8 mg total) by mouth every 8 (eight) hours as needed for nausea or vomiting. 20 tablet 0  . ONE TOUCH ULTRA TEST test strip   1  .  oxyCODONE-acetaminophen (PERCOCET/ROXICET) 5-325 MG per tablet Take 1 tablet by mouth every 4 (four) hours as needed for severe pain. 20 tablet 0  . doxycycline (DORYX) 100 MG EC tablet Take 1 tablet (100 mg total) by mouth 2 (two) times daily. (Patient not taking: Reported on 12/08/2014) 20 tablet 0  . doxycycline (VIBRA-TABS) 100 MG tablet Take 100 mg by mouth 2 (two) times daily.  0   No current facility-administered medications for this visit.   Facility-Administered Medications Ordered in Other Visits  Medication Dose Route Frequency Provider Last Rate Last Dose  . heparin lock flush 100 unit/mL  500 Units Intravenous Once [name], MD      . sodium chloride 0.9 % injection 10 mL  10 mL Intravenous PRN [name], MD   10 mL at 12/14/14 3419    Review of Systems:  GENERAL: Feels good. No fevers or sweats. Weight stable. PERFORMANCE STATUS (ECOG): 1-2 HEENT:No visual changes, runny nose, sore throat, mouth sores or tenderness. Lungs: No shortness of breath or cough. No hemoptysis. Cardiac: No chest pain, palpitations, orthopnea, or PND. GI: Good appetite.  No nausea, vomiting, diarrhea, constipation, melena or hematochezia. GU: No urgency, frequency, dysuria, or hematuria. Musculoskeletal: No back pain. No joint pain. No muscle tenderness. Extremities: No pain or swelling. Skin: Chest wall lesions growing.  Streaking on chest wall away from incision. Neuro: No headache, numbness or weakness, balance or coordination issues. Endocrine: No diabetes, thyroid issues, hot flashes or night sweats. Psych: No mood changes, depression or anxiety. Pain: No focal pain. Review of systems: All other systems reviewed and found to be negative.  Physical Exam: Blood pressure 157/68, pulse 76, temperature 98.1 F (36.7 C), temperature source Tympanic, resp. rate 18, weight 113 lb 8.6 oz (51.5 kg). GENERAL: Thin elderly woman sitting comfortably in the exam room  in no acute distress. MENTAL STATUS: Alert and oriented to person, place and time. HEAD: Short gray wig. Normocephalic, atraumatic, face symmetric, no Cushingoid features. EYES: Blue eyes. Pupils equal round and reactive to light and accomodation. No conjunctivitis or scleral icterus. ENT: Oropharynx clear without lesion. Tongue normal. Mucous membranes moist.  RESPIRATORY: Clear to auscultation without rales, wheezes or rhonchi. CARDIOVASCULAR: Regular rate and rhythm without murmur, rub or gallop. BREAST: Right mastectomy. Nodularity measuring 4.8 x 3.5 cm medially and 3.5 x 3 cm laterally.  Irregular border streaking extends 6-7 cm superiorly and 3.5-4 cm inferiorly.  7-8 mm nodular area above incision between 2 larger nodules.  Indistinct palpable area laterally along incision in axillae (difficult to measure extending approximately 7 cm). ABDOMEN: Soft, non-tender, with  active bowel sounds, and no hepatosplenomegaly. No masses. BACK: Kyphosis. SKIN: Chest wall lesions as above.  Upper back resolved. EXTREMITIES: No edema, no skin discoloration or tenderness. No palpable cords. LYMPH NODES: No palpable cervical, supraclavicular, axillary or inguinal adenopathy  NEUROLOGICAL: Unremarkable. PSYCH: Appropriate.   Infusion on 12/14/2014  Component Date Value Ref Range Status  . WBC 12/14/2014 4.3  3.6 - 11.0 K/uL Final   A-LINE DRAW  . RBC 12/14/2014 3.83  3.80 - 5.20 MIL/uL Final  . Hemoglobin 12/14/2014 9.5* 12.0 - 16.0 g/dL Final  . HCT 12/14/2014 29.4* 35.0 - 47.0 % Final  . MCV 12/14/2014 76.7* 80.0 - 100.0 fL Final  . MCH 12/14/2014 24.7* 26.0 - 34.0 pg Final  . MCHC 12/14/2014 32.2  32.0 - 36.0 g/dL Final  . RDW 12/14/2014 25.0* 11.5 - 14.5 % Final  . Platelets 12/14/2014 339  150 - 440 K/uL Final  . Neutrophils Relative % 12/14/2014 58   Final  . Neutro Abs 12/14/2014 2.5  1.4 - 6.5 K/uL Final  . Lymphocytes Relative 12/14/2014 23   Final  . Lymphs Abs  12/14/2014 1.0  1.0 - 3.6 K/uL Final  . Monocytes Relative 12/14/2014 16   Final  . Monocytes Absolute 12/14/2014 0.7  0.2 - 0.9 K/uL Final  . Eosinophils Relative 12/14/2014 1   Final  . Eosinophils Absolute 12/14/2014 0.1  0 - 0.7 K/uL Final  . Basophils Relative 12/14/2014 2   Final  . Basophils Absolute 12/14/2014 0.1  0 - 0.1 K/uL Final  . Sodium 12/14/2014 138  135 - 145 mmol/L Final  . Potassium 12/14/2014 4.2  3.5 - 5.1 mmol/L Final  . Chloride 12/14/2014 107  101 - 111 mmol/L Final  . CO2 12/14/2014 23  22 - 32 mmol/L Final  . Glucose, Bld 12/14/2014 165* 65 - 99 mg/dL Final  . BUN 12/14/2014 20  6 - 20 mg/dL Final  . Creatinine, Ser 12/14/2014 0.51  0.44 - 1.00 mg/dL Final  . Calcium 12/14/2014 9.1  8.9 - 10.3 mg/dL Final  . Total Protein 12/14/2014 6.9  6.5 - 8.1 g/dL Final  . Albumin 12/14/2014 4.1  3.5 - 5.0 g/dL Final  . AST 12/14/2014 18  15 - 41 U/L Final  . ALT 12/14/2014 12* 14 - 54 U/L Final  . Alkaline Phosphatase 12/14/2014 57  38 - 126 U/L Final  . Total Bilirubin 12/14/2014 0.3  0.3 - 1.2 mg/dL Final  . GFR calc non Af Amer 12/14/2014 >60  >60 mL/min Final  . GFR calc Af Amer 12/14/2014 >60  >60 mL/min Final   Comment: (NOTE) The eGFR has been calculated using the CKD EPI equation. This calculation has not been validated in all clinical situations. eGFR's persistently <60 mL/min signify possible Chronic Kidney Disease.   . Anion gap 12/14/2014 8  5 - 15 Final    Assessment:  Dawn Wiley is an 80 y.o. female with stage IIIC right breast cancer status post mastectomy and axillary lymph node dissection on 08/31/2014. 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 Carcinoma involved skeletal muscle and dermal lymphatics. Tumor was less than 0.5 mm from the deep margin. Thirteen of 15 lymph nodes were involved. The largest metastatic focus was 2 cm. Tumor is triple negative (ER negative, PR negative,  and HER-2/neu negative). Pathologic stage was T3N3aMx.  PET scan on 09/21/2014 revealed postoperative changes in the right chest with no suspicious findings or residual tumor. Bone scan on  10/11/2014 revealed no evidence of metastatic disease.  Bone density study on 10/02/2014 revealed a T score of -4.7 in the forearm consistent with osteoporosis. T-score was less than 2.5 in the spine or hip. Echocardiogram on 10/02/2014 revealed ejection fraction of 60-65%.  She has iron deficiency anemia. Labs on 09/19/2014 revealed a hematocrit of 29.9, hemoglobin 8.7, MCV 69.7, ferritin 9, and TIBC 616. B12 was low (265) with a prior history of B12 deficiency and need for supplementation (stopped 06/2014). She began weekly B12 on 10/12/2014.  Folate was normal. Her diet is modest. She has never had a colonoscopy. She denies any melena or hematochezia.  She continues to tolerate 1 iron pill a day.  She is eating red meat.  Hematocrit is stable to slightly improved.  Chest wall biopsy x 3 (lateral, medial, and middle sites) on 11/08/2014 revealed dermal lymphatic involvement by invasive mammary carcinoma with micropapillary features.    She is status post 5 weekly cycles of Taxol (11/03/2014 - 12/01/2014).  She has a grade I neuropathy.  Symptomatically, she feels good.  There is ongoing progression of chest wall disease.  Plan: 1.  Labs today:  CBC with diff, CMP. 2.  Week #1 adriamycin today. 3.  Rx:  Ativan 0.5 mg po q 6 hours prn nausea.   4.  Continue oral iron. 5.  RTC in 1 week for MD assess, labs (CBC with diff, CMP), and week #2 adriamycin.   [name], MD  12/14/2014, 9:17 AM

## 2014-12-14 NOTE — Progress Notes (Signed)
Patient here today for follow up Breast Cancer and possible treatment. Denies any pain or complications at this time.

## 2014-12-15 ENCOUNTER — Ambulatory Visit: Payer: PPO | Admitting: Hematology and Oncology

## 2014-12-18 ENCOUNTER — Other Ambulatory Visit: Payer: Self-pay

## 2014-12-18 DIAGNOSIS — C50911 Malignant neoplasm of unspecified site of right female breast: Secondary | ICD-10-CM

## 2014-12-21 ENCOUNTER — Inpatient Hospital Stay (HOSPITAL_BASED_OUTPATIENT_CLINIC_OR_DEPARTMENT_OTHER): Payer: PPO | Admitting: Hematology and Oncology

## 2014-12-21 ENCOUNTER — Ambulatory Visit: Payer: PPO

## 2014-12-21 ENCOUNTER — Ambulatory Visit: Payer: PPO | Admitting: Hematology and Oncology

## 2014-12-21 ENCOUNTER — Other Ambulatory Visit: Payer: PPO

## 2014-12-21 ENCOUNTER — Inpatient Hospital Stay: Payer: PPO

## 2014-12-21 DIAGNOSIS — R197 Diarrhea, unspecified: Secondary | ICD-10-CM

## 2014-12-21 DIAGNOSIS — G629 Polyneuropathy, unspecified: Secondary | ICD-10-CM

## 2014-12-21 DIAGNOSIS — I2510 Atherosclerotic heart disease of native coronary artery without angina pectoris: Secondary | ICD-10-CM

## 2014-12-21 DIAGNOSIS — Z171 Estrogen receptor negative status [ER-]: Secondary | ICD-10-CM | POA: Diagnosis not present

## 2014-12-21 DIAGNOSIS — Z87442 Personal history of urinary calculi: Secondary | ICD-10-CM

## 2014-12-21 DIAGNOSIS — M129 Arthropathy, unspecified: Secondary | ICD-10-CM

## 2014-12-21 DIAGNOSIS — R000 Tachycardia, unspecified: Secondary | ICD-10-CM

## 2014-12-21 DIAGNOSIS — E538 Deficiency of other specified B group vitamins: Secondary | ICD-10-CM

## 2014-12-21 DIAGNOSIS — E119 Type 2 diabetes mellitus without complications: Secondary | ICD-10-CM

## 2014-12-21 DIAGNOSIS — M810 Age-related osteoporosis without current pathological fracture: Secondary | ICD-10-CM

## 2014-12-21 DIAGNOSIS — D509 Iron deficiency anemia, unspecified: Secondary | ICD-10-CM

## 2014-12-21 DIAGNOSIS — M79676 Pain in unspecified toe(s): Secondary | ICD-10-CM

## 2014-12-21 DIAGNOSIS — Z5111 Encounter for antineoplastic chemotherapy: Secondary | ICD-10-CM | POA: Diagnosis not present

## 2014-12-21 DIAGNOSIS — I10 Essential (primary) hypertension: Secondary | ICD-10-CM

## 2014-12-21 DIAGNOSIS — Z79899 Other long term (current) drug therapy: Secondary | ICD-10-CM

## 2014-12-21 DIAGNOSIS — C50911 Malignant neoplasm of unspecified site of right female breast: Secondary | ICD-10-CM | POA: Diagnosis not present

## 2014-12-21 DIAGNOSIS — Z7982 Long term (current) use of aspirin: Secondary | ICD-10-CM

## 2014-12-21 DIAGNOSIS — E785 Hyperlipidemia, unspecified: Secondary | ICD-10-CM

## 2014-12-21 LAB — CBC WITH DIFFERENTIAL/PLATELET
Basophils Absolute: 0.1 10*3/uL (ref 0–0.1)
Basophils Relative: 1 %
Eosinophils Absolute: 0 10*3/uL (ref 0–0.7)
Eosinophils Relative: 0 %
HCT: 31.6 % — ABNORMAL LOW (ref 35.0–47.0)
Hemoglobin: 9.9 g/dL — ABNORMAL LOW (ref 12.0–16.0)
Lymphocytes Relative: 12 %
Lymphs Abs: 1.1 10*3/uL (ref 1.0–3.6)
MCH: 24.6 pg — ABNORMAL LOW (ref 26.0–34.0)
MCHC: 31.4 g/dL — ABNORMAL LOW (ref 32.0–36.0)
MCV: 78.4 fL — ABNORMAL LOW (ref 80.0–100.0)
Monocytes Absolute: 0.5 10*3/uL (ref 0.2–0.9)
Monocytes Relative: 5 %
Neutro Abs: 7.6 10*3/uL — ABNORMAL HIGH (ref 1.4–6.5)
Neutrophils Relative %: 82 %
Platelets: 345 10*3/uL (ref 150–440)
RBC: 4.03 MIL/uL (ref 3.80–5.20)
RDW: 25.1 % — ABNORMAL HIGH (ref 11.5–14.5)
WBC: 9.2 10*3/uL (ref 3.6–11.0)

## 2014-12-21 LAB — COMPREHENSIVE METABOLIC PANEL
ALT: 11 U/L — ABNORMAL LOW (ref 14–54)
AST: 20 U/L (ref 15–41)
Albumin: 4.1 g/dL (ref 3.5–5.0)
Alkaline Phosphatase: 66 U/L (ref 38–126)
Anion gap: 4 — ABNORMAL LOW (ref 5–15)
BUN: 13 mg/dL (ref 6–20)
CO2: 26 mmol/L (ref 22–32)
Calcium: 8.9 mg/dL (ref 8.9–10.3)
Chloride: 103 mmol/L (ref 101–111)
Creatinine, Ser: 0.69 mg/dL (ref 0.44–1.00)
GFR calc Af Amer: >60 mL/min (ref >60)
GFR calc non Af Amer: >60 mL/min (ref >60)
Glucose, Bld: 181 mg/dL — ABNORMAL HIGH (ref 65–99)
Potassium: 4 mmol/L (ref 3.5–5.1)
Sodium: 133 mmol/L — ABNORMAL LOW (ref 135–145)
Total Bilirubin: 0.5 mg/dL (ref 0.3–1.2)
Total Protein: 7.2 g/dL (ref 6.5–8.1)

## 2014-12-21 MED ORDER — HEPARIN SOD (PORK) LOCK FLUSH 100 UNIT/ML IV SOLN
500.0000 [IU] | Freq: Once | INTRAVENOUS | Status: AC
Start: 2014-12-21 — End: 2014-12-21
  Administered 2014-12-21: 500 [IU] via INTRAVENOUS

## 2014-12-21 MED ORDER — HEPARIN SOD (PORK) LOCK FLUSH 100 UNIT/ML IV SOLN
INTRAVENOUS | Status: AC
  Filled 2014-12-21: qty 5

## 2014-12-21 NOTE — Progress Notes (Signed)
Patient reports new areas of concern on chest area.

## 2014-12-24 ENCOUNTER — Encounter: Payer: Self-pay | Admitting: Hematology and Oncology

## 2014-12-24 ENCOUNTER — Other Ambulatory Visit: Payer: Self-pay | Admitting: Hematology and Oncology

## 2014-12-24 NOTE — Progress Notes (Signed)
[facility] day:  12/21/2014  Chief Complaint: Dawn Wiley is an 80 y.o. female with stage IIIC right breast cancer who is seen for assessment prior to week #2 adriamycin.  HPI: The patient was last seen in the medical oncology clinic on 12/14/2014.  At that time, she she began week #1 adriamycin.  She tolerated her chemotherapy well.  She denied any nausea or vomiting.  She notes that on Sunday (12/17/2014) in church she felt fatigued.  After church, she slept 1 1/2 hours.  Since that time, she has felt fine.  Regarding her chest wall disease, she states "it doesn't look good".   Past Medical History  Diagnosis Date  . Kidney stones   . Hyperlipidemia   . Coronary artery disease     Coronary calcifications noted on CT scan  . Diabetes mellitus without complication   . Nephrolithiasis   . Hypertension   . Arthritis   . Anemia   . Tachycardia     Dr. Fletcher Anon, cardiologist  . Cancer     breast (right)  . PONV (postoperative nausea and vomiting)   . Rash     back  . Breast cancer     Past Surgical History  Procedure Laterality Date  . Temporomandibular joint surgery    . Cataract extraction      bilateral  . Breast surgery      breast biopsy X 3  . Eye surgery      bilateral cataract extraction  . Mastectomy modified radical Right 08/31/2014    Procedure: MASTECTOMY MODIFIED RADICAL;  Surgeon: Molly Maduro, MD;  Location: [facility] ORS;  Service: General;  Laterality: Right;  . Portacath placement Left 10/20/2014    Procedure: INSERTION PORT-A-CATH;  Surgeon: Marlyce Huge, MD;  Location: [facility] ORS;  Service: General;  Laterality: Left;    Family History  Problem Relation Age of Onset  . Peripheral Artery Disease Sister     carotid artery stenosis   . Heart disease Sister   . Diabetes Sister     Social History:  reports that she has never smoked. She has never used smokeless tobacco. She reports that she does not  drink alcohol or use illicit drugs.  The patient is alone today.  Allergies: No Known Allergies  Current Medications: Current Outpatient Prescriptions  Medication Sig Dispense Refill  . acetaminophen (TYLENOL) 500 MG tablet Take 500 mg by mouth every 6 (six) hours as needed for mild pain or moderate pain.     [name] Kitchen amLODipine (NORVASC) 5 MG tablet Take 1 tablet (5 mg total) by mouth daily. 90 tablet 1  . aspirin 81 MG tablet Take 81 mg by mouth every other day.    . Calcium Carbonate (CALTRATE 600 PO) Take 1,200 mg by mouth daily.     . cholecalciferol (VITAMIN D) 400 UNITS TABS tablet Take 800 Units by mouth.    . Cyanocobalamin (VITAMIN B-12 IJ) Inject as directed every 30 (thirty) days.     . diphenhydrAMINE (BENADRYL) 25 mg capsule Take 25 mg by mouth every 6 (six) hours as needed for allergies.    [name] Kitchen doxycycline (VIBRA-TABS) 100 MG tablet Take 100 mg by mouth 2 (two) times daily.  0  . ferrous sulfate 325 (65 FE) MG tablet Take 325 mg by mouth daily with breakfast.     . ibuprofen (ADVIL,MOTRIN) 400 MG tablet Take 1 tablet (400 mg total) by mouth every 4 (four) hours as needed for  mild pain or moderate pain. 30 tablet 0  . lidocaine-prilocaine (EMLA) cream Apply 1 application topically as needed. Apply 1-2 hours prior to chemotherapy 30 g 1  . LORazepam (ATIVAN) 0.5 MG tablet Take 1 tablet (0.5 mg total) by mouth every 6 (six) hours as needed (Take 0.76m by mouth every 6 hours as needed for Nausea and vomiting). 30 tablet 0  . metFORMIN (GLUCOPHAGE) 500 MG tablet Take 1 tablet (500 mg total) by mouth 2 (two) times daily with a meal. 180 tablet 1  . metoprolol tartrate (LOPRESSOR) 25 MG tablet Take 1 tablet (25 mg total) by mouth 2 (two) times daily. 180 tablet 1  . ondansetron (ZOFRAN) 8 MG tablet Take 1 tablet (8 mg total) by mouth every 8 (eight) hours as needed for nausea or vomiting. 20 tablet 0  . oxyCODONE-acetaminophen (PERCOCET/ROXICET) 5-325 MG per tablet Take 1 tablet by mouth every 4  (four) hours as needed for severe pain. 20 tablet 0   No current facility-administered medications for this visit.    Review of Systems:  GENERAL: No problems No fevers or sweats. Weight stable. PERFORMANCE STATUS (ECOG): 1-2 HEENT:No visual changes, runny nose, sore throat, mouth sores or tenderness. Lungs: No shortness of breath or cough. No hemoptysis. Cardiac: No chest pain, palpitations, orthopnea, or PND. GI: Good appetite.  No nausea, vomiting, diarrhea, constipation, melena or hematochezia. GU: No urgency, frequency, dysuria, or hematuria. Musculoskeletal: No back pain. No joint pain. No muscle tenderness. Extremities: No pain or swelling. Skin: Chest wall lesions "don't look good".  Streaking on chest wall away from incision. Neuro: No headache, numbness or weakness, balance or coordination issues. Endocrine: No diabetes, thyroid issues, hot flashes or night sweats. Psych: No mood changes, depression or anxiety. Pain: No focal pain. Review of systems: All other systems reviewed and found to be negative.  Physical Exam: There were no vitals taken for this visit. GENERAL: Thin elderly woman sitting comfortably in the exam room in no acute distress. MENTAL STATUS: Alert and oriented to person, place and time. HEAD: Short silver wig. Normocephalic, atraumatic, face symmetric, no Cushingoid features. EYES: Blue eyes. Pupils equal round and reactive to light and accomodation. No conjunctivitis or scleral icterus. ENT: Oropharynx clear without lesion. Tongue normal. Mucous membranes moist.  RESPIRATORY: Clear to auscultation without rales, wheezes or rhonchi. CARDIOVASCULAR: Regular rate and rhythm without murmur, rub or gallop. BREAST: Right mastectomy. Nodularity measuring 5 x 3.2 cm medially and 4.5 x 3 cm laterally.  Areas are more heaped up.  Irregular border streaking extends 7 cm superiorly and 3.5 cm inferiorly.  7 mm  And 5 mm nodular area  above incision between 2 larger nodules.  More distinct palpable area laterally along incision in axillae (extending 8 cm and approximately 5.5 cm wide). ABDOMEN: Soft, non-tender, with active bowel sounds, and no hepatosplenomegaly. No masses. BACK: Kyphosis. SKIN: Chest wall lesions as above. EXTREMITIES: No edema, no skin discoloration or tenderness. No palpable cords. LYMPH NODES: No palpable cervical, supraclavicular, axillary or inguinal adenopathy  NEUROLOGICAL: Unremarkable. PSYCH: Appropriate.   Infusion on 12/21/2014  Component Date Value Ref Range Status  . WBC 12/21/2014 9.2  3.6 - 11.0 K/uL Final   A-LINE DRAW  . RBC 12/21/2014 4.03  3.80 - 5.20 MIL/uL Final  . Hemoglobin 12/21/2014 9.9* 12.0 - 16.0 g/dL Final  . HCT 12/21/2014 31.6* 35.0 - 47.0 % Final  . MCV 12/21/2014 78.4* 80.0 - 100.0 fL Final  . MCH 12/21/2014 24.6* 26.0 - 34.0 pg  Final  . MCHC 12/21/2014 31.4* 32.0 - 36.0 g/dL Final  . RDW 12/21/2014 25.1* 11.5 - 14.5 % Final  . Platelets 12/21/2014 345  150 - 440 K/uL Final  . Neutrophils Relative % 12/21/2014 82   Final  . Neutro Abs 12/21/2014 7.6* 1.4 - 6.5 K/uL Final  . Lymphocytes Relative 12/21/2014 12   Final  . Lymphs Abs 12/21/2014 1.1  1.0 - 3.6 K/uL Final  . Monocytes Relative 12/21/2014 5   Final  . Monocytes Absolute 12/21/2014 0.5  0.2 - 0.9 K/uL Final  . Eosinophils Relative 12/21/2014 0   Final  . Eosinophils Absolute 12/21/2014 0.0  0 - 0.7 K/uL Final  . Basophils Relative 12/21/2014 1   Final  . Basophils Absolute 12/21/2014 0.1  0 - 0.1 K/uL Final  . Sodium 12/21/2014 133* 135 - 145 mmol/L Final  . Potassium 12/21/2014 4.0  3.5 - 5.1 mmol/L Final  . Chloride 12/21/2014 103  101 - 111 mmol/L Final  . CO2 12/21/2014 26  22 - 32 mmol/L Final  . Glucose, Bld 12/21/2014 181* 65 - 99 mg/dL Final  . BUN 12/21/2014 13  6 - 20 mg/dL Final  . Creatinine, Ser 12/21/2014 0.69  0.44 - 1.00 mg/dL Final  . Calcium 12/21/2014 8.9  8.9 - 10.3 mg/dL  Final  . Total Protein 12/21/2014 7.2  6.5 - 8.1 g/dL Final  . Albumin 12/21/2014 4.1  3.5 - 5.0 g/dL Final  . AST 12/21/2014 20  15 - 41 U/L Final  . ALT 12/21/2014 11* 14 - 54 U/L Final  . Alkaline Phosphatase 12/21/2014 66  38 - 126 U/L Final  . Total Bilirubin 12/21/2014 0.5  0.3 - 1.2 mg/dL Final  . GFR calc non Af Amer 12/21/2014 >60  >60 mL/min Final  . GFR calc Af Amer 12/21/2014 >60  >60 mL/min Final   Comment: (NOTE) The eGFR has been calculated using the CKD EPI equation. This calculation has not been validated in all clinical situations. eGFR's persistently <60 mL/min signify possible Chronic Kidney Disease.   . Anion gap 12/21/2014 4* 5 - 15 Final    Assessment:  Dawn Wiley is an 80 y.o. female with stage IIIC right breast cancer status post mastectomy and axillary lymph node dissection on 08/31/2014. 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 Carcinoma involved skeletal muscle and dermal lymphatics. Tumor was less than 0.5 mm from the deep margin. Thirteen of 15 lymph nodes were involved. The largest metastatic focus was 2 cm. Tumor is triple negative (ER negative, PR negative, and HER-2/neu negative). Pathologic stage was T3N3aMx.  PET scan on 09/21/2014 revealed postoperative changes in the right chest with no suspicious findings or residual tumor. Bone scan on 10/11/2014 revealed no evidence of metastatic disease.  Bone density study on 10/02/2014 revealed a T score of -4.7 in the forearm consistent with osteoporosis. T-score was less than 2.5 in the spine or hip. Echocardiogram on 10/02/2014 revealed ejection fraction of 60-65%.  She has iron deficiency anemia. Labs on 09/19/2014 revealed a hematocrit of 29.9, hemoglobin 8.7, MCV 69.7, ferritin 9, and TIBC 616. B12 was low (265) with a prior history of B12 deficiency and need for supplementation (stopped 06/2014). She began B12 on 10/12/2014 (last  11/24/2014).  Folate was normal. Her diet is modest. She has never had a colonoscopy. She denies any melena or hematochezia.  She continues to tolerate 1 iron pill a day.  She is eating red meat.  Hematocrit is stable to slightly improved.  Chest wall biopsy x 3 (lateral, medial, and middle sites) on 11/08/2014 revealed dermal lymphatic involvement by invasive mammary carcinoma with micropapillary features.    She is status post 5 weekly cycles of Taxol (11/03/2014 - 12/01/2014).  She has a grade I neuropathy.  She is status post 1 week of adriamycin (12/14/2014).  She denied any side effect except for fatigue 3 days post chemotherapy.  Counts are good.  Symptomatically, she denies any complaint.  There is ongoing progression of chest wall disease.  Plan: 1.  Labs today:  CBC with diff, CMP. 2.  Discontinue weekly adriamycin. 3.  Medical photography. 4.  Discuss concern for ongoing progressive disease.  Discuss full dose chemotherapy (AC every 3 weeks) with Neulasta support.  Unclear if disease will be responsive with progression on weekly adriamycin.  Discussed full dose carboplatin +/- taxane as patient has triple negative disease and known sensitivity of triple negative breast cancer to platinum agents.  If unresponsive to chemotherapy, discussed palliative radiation.  Patient states that she is willing to try additional chemotherapy. 5.  Discuss with colleague- Dr. Oliva Bustard- done.   6.  Preauth carboplatin (AUC 5) every 3 weeks. 7.  Continue B12 monthly.  Due next week. 8.  RTC for MD assess, labs (CBC with diff, CMP, Mg), and carboplatin.   [name], MD  12/21/2014, 7:54 PM

## 2014-12-25 ENCOUNTER — Telehealth: Payer: Self-pay | Admitting: *Deleted

## 2014-12-25 NOTE — Telephone Encounter (Signed)
Dawn Wiley with [employer] called. States that medication was supposed to be prior [place]. Pt. Has not heard if this has been done. Caller did not leave name of medication. Due to late call for the end of the day, RN will attempt to call [employer] tomorrow to determine what medication needed prior auth.

## 2014-12-26 NOTE — Telephone Encounter (Signed)
[employer] wanted to know if the carboplatin has been approved. Patient stated to insurance that she has not yet been notified that the [place] has been approved.

## 2014-12-27 ENCOUNTER — Other Ambulatory Visit: Payer: Self-pay

## 2014-12-28 ENCOUNTER — Inpatient Hospital Stay: Payer: PPO

## 2014-12-28 ENCOUNTER — Inpatient Hospital Stay (HOSPITAL_BASED_OUTPATIENT_CLINIC_OR_DEPARTMENT_OTHER): Payer: PPO | Admitting: Hematology and Oncology

## 2014-12-28 ENCOUNTER — Inpatient Hospital Stay: Payer: PPO | Attending: Hematology and Oncology

## 2014-12-28 VITALS — BP 162/71 | HR 83 | Temp 97.7°F | Ht 60.0 in | Wt 110.0 lb

## 2014-12-28 DIAGNOSIS — Z9011 Acquired absence of right breast and nipple: Secondary | ICD-10-CM | POA: Insufficient documentation

## 2014-12-28 DIAGNOSIS — I2510 Atherosclerotic heart disease of native coronary artery without angina pectoris: Secondary | ICD-10-CM | POA: Diagnosis not present

## 2014-12-28 DIAGNOSIS — M129 Arthropathy, unspecified: Secondary | ICD-10-CM | POA: Insufficient documentation

## 2014-12-28 DIAGNOSIS — R000 Tachycardia, unspecified: Secondary | ICD-10-CM

## 2014-12-28 DIAGNOSIS — Z79899 Other long term (current) drug therapy: Secondary | ICD-10-CM | POA: Insufficient documentation

## 2014-12-28 DIAGNOSIS — Z7982 Long term (current) use of aspirin: Secondary | ICD-10-CM | POA: Diagnosis not present

## 2014-12-28 DIAGNOSIS — I10 Essential (primary) hypertension: Secondary | ICD-10-CM | POA: Diagnosis not present

## 2014-12-28 DIAGNOSIS — Z171 Estrogen receptor negative status [ER-]: Secondary | ICD-10-CM | POA: Insufficient documentation

## 2014-12-28 DIAGNOSIS — E538 Deficiency of other specified B group vitamins: Secondary | ICD-10-CM | POA: Insufficient documentation

## 2014-12-28 DIAGNOSIS — E119 Type 2 diabetes mellitus without complications: Secondary | ICD-10-CM

## 2014-12-28 DIAGNOSIS — Z87442 Personal history of urinary calculi: Secondary | ICD-10-CM | POA: Insufficient documentation

## 2014-12-28 DIAGNOSIS — Z5111 Encounter for antineoplastic chemotherapy: Secondary | ICD-10-CM | POA: Insufficient documentation

## 2014-12-28 DIAGNOSIS — D649 Anemia, unspecified: Secondary | ICD-10-CM

## 2014-12-28 DIAGNOSIS — D509 Iron deficiency anemia, unspecified: Secondary | ICD-10-CM | POA: Insufficient documentation

## 2014-12-28 DIAGNOSIS — C792 Secondary malignant neoplasm of skin: Secondary | ICD-10-CM | POA: Insufficient documentation

## 2014-12-28 DIAGNOSIS — C50911 Malignant neoplasm of unspecified site of right female breast: Secondary | ICD-10-CM

## 2014-12-28 DIAGNOSIS — E785 Hyperlipidemia, unspecified: Secondary | ICD-10-CM | POA: Diagnosis not present

## 2014-12-28 LAB — CBC WITH DIFFERENTIAL/PLATELET
Basophils Absolute: 0.1 10*3/uL (ref 0–0.1)
Basophils Relative: 1 %
Eosinophils Absolute: 0.1 10*3/uL (ref 0–0.7)
Eosinophils Relative: 1 %
HCT: 31.4 % — ABNORMAL LOW (ref 35.0–47.0)
Hemoglobin: 10 g/dL — ABNORMAL LOW (ref 12.0–16.0)
Lymphocytes Relative: 24 %
Lymphs Abs: 1.2 10*3/uL (ref 1.0–3.6)
MCH: 25.1 pg — ABNORMAL LOW (ref 26.0–34.0)
MCHC: 31.8 g/dL — ABNORMAL LOW (ref 32.0–36.0)
MCV: 78.9 fL — ABNORMAL LOW (ref 80.0–100.0)
Monocytes Absolute: 0.5 10*3/uL (ref 0.2–0.9)
Monocytes Relative: 10 %
Neutro Abs: 3.4 10*3/uL (ref 1.4–6.5)
Neutrophils Relative %: 64 %
Platelets: 342 10*3/uL (ref 150–440)
RBC: 3.98 MIL/uL (ref 3.80–5.20)
RDW: 25.6 % — ABNORMAL HIGH (ref 11.5–14.5)
WBC: 5.3 10*3/uL (ref 3.6–11.0)

## 2014-12-28 LAB — COMPREHENSIVE METABOLIC PANEL
ALT: 11 U/L — ABNORMAL LOW (ref 14–54)
AST: 18 U/L (ref 15–41)
Albumin: 3.9 g/dL (ref 3.5–5.0)
Alkaline Phosphatase: 65 U/L (ref 38–126)
Anion gap: 6 (ref 5–15)
BUN: 16 mg/dL (ref 6–20)
CO2: 24 mmol/L (ref 22–32)
Calcium: 8.9 mg/dL (ref 8.9–10.3)
Chloride: 106 mmol/L (ref 101–111)
Creatinine, Ser: 0.45 mg/dL (ref 0.44–1.00)
GFR calc Af Amer: >60 mL/min (ref >60)
GFR calc non Af Amer: >60 mL/min (ref >60)
Glucose, Bld: 147 mg/dL — ABNORMAL HIGH (ref 65–99)
Potassium: 3.9 mmol/L (ref 3.5–5.1)
Sodium: 136 mmol/L (ref 135–145)
Total Bilirubin: 0.6 mg/dL (ref 0.3–1.2)
Total Protein: 7.1 g/dL (ref 6.5–8.1)

## 2014-12-28 MED ORDER — SODIUM CHLORIDE 0.9 % IJ SOLN
10.0000 mL | INTRAMUSCULAR | Status: DC | PRN
  Administered 2014-12-28: 10 mL via INTRAVENOUS
  Filled 2014-12-28: qty 10

## 2014-12-28 MED ORDER — CARBOPLATIN CHEMO INJECTION 450 MG/45ML
313.5000 mg | Freq: Once | INTRAVENOUS | Status: AC
  Administered 2014-12-28: 310 mg via INTRAVENOUS
  Filled 2014-12-28: qty 31

## 2014-12-28 MED ORDER — SODIUM CHLORIDE 0.9 % IV SOLN
Freq: Once | INTRAVENOUS | Status: AC
  Administered 2014-12-28: 15:00:00 via INTRAVENOUS
  Filled 2014-12-28: qty 1000

## 2014-12-28 MED ORDER — SODIUM CHLORIDE 0.9 % IV SOLN
Freq: Once | INTRAVENOUS | Status: AC
  Administered 2014-12-28: 15:00:00 via INTRAVENOUS
  Filled 2014-12-28: qty 8

## 2014-12-28 MED ORDER — CYANOCOBALAMIN 1000 MCG/ML IJ SOLN
1000.0000 ug | Freq: Once | INTRAMUSCULAR | Status: AC
  Administered 2014-12-28: 1000 ug via INTRAMUSCULAR
  Filled 2014-12-28: qty 1

## 2014-12-28 MED ORDER — HEPARIN SOD (PORK) LOCK FLUSH 100 UNIT/ML IV SOLN
500.0000 [IU] | Freq: Once | INTRAVENOUS | Status: AC
  Administered 2014-12-28: 500 [IU] via INTRAVENOUS
  Filled 2014-12-28: qty 5

## 2014-12-28 NOTE — Progress Notes (Signed)
Patient here for follow up no complaints today. 

## 2014-12-28 NOTE — Progress Notes (Signed)
[facility] day:  12/28/2014  Chief Complaint: Dawn Wiley is an 80 y.o. female with stage IIIC right breast cancer who is seen for assessment prior to initiation of carboplatin.  HPI: The patient was last seen in the medical oncology clinic on 12/21/2014.  At that time, exam revealed progressive disease following her first week of adriamycin.  We discussed treatment options.  Decision was made to pursue carboplatin given her triple negative disease.  During the interim, she states that the places on her chest "don't look good".  She has new spots.   Past Medical History  Diagnosis Date  . Kidney stones   . Hyperlipidemia   . Coronary artery disease     Coronary calcifications noted on CT scan  . Diabetes mellitus without complication   . Nephrolithiasis   . Hypertension   . Arthritis   . Anemia   . Tachycardia     Dr. Fletcher Anon, cardiologist  . Cancer     breast (right)  . PONV (postoperative nausea and vomiting)   . Rash     back  . Breast cancer     Past Surgical History  Procedure Laterality Date  . Temporomandibular joint surgery    . Cataract extraction      bilateral  . Breast surgery      breast biopsy X 3  . Eye surgery      bilateral cataract extraction  . Mastectomy modified radical Right 08/31/2014    Procedure: MASTECTOMY MODIFIED RADICAL;  Surgeon: Molly Maduro, MD;  Location: [facility] ORS;  Service: General;  Laterality: Right;  . Portacath placement Left 10/20/2014    Procedure: INSERTION PORT-A-CATH;  Surgeon: Marlyce Huge, MD;  Location: [facility] ORS;  Service: General;  Laterality: Left;    Family History  Problem Relation Age of Onset  . Peripheral Artery Disease Sister     carotid artery stenosis   . Heart disease Sister   . Diabetes Sister     Social History:  reports that she has never smoked. She has never used smokeless tobacco. She reports that she does not drink alcohol or use illicit  drugs.  The patient is accompanied by her sister-in-law.  Allergies: No Known Allergies  Current Medications: Current Outpatient Prescriptions  Medication Sig Dispense Refill  . acetaminophen (TYLENOL) 500 MG tablet Take 500 mg by mouth every 6 (six) hours as needed for mild pain or moderate pain.     [name] Kitchen amLODipine (NORVASC) 5 MG tablet Take 1 tablet (5 mg total) by mouth daily. 90 tablet 1  . aspirin 81 MG tablet Take 81 mg by mouth every other day.    . Calcium Carbonate (CALTRATE 600 PO) Take 1,200 mg by mouth daily.     . cholecalciferol (VITAMIN D) 400 UNITS TABS tablet Take 800 Units by mouth.    . Cyanocobalamin (VITAMIN B-12 IJ) Inject as directed every 30 (thirty) days.     . diphenhydrAMINE (BENADRYL) 25 mg capsule Take 25 mg by mouth every 6 (six) hours as needed for allergies.    . ferrous sulfate 325 (65 FE) MG tablet Take 325 mg by mouth daily with breakfast.     . ibuprofen (ADVIL,MOTRIN) 400 MG tablet Take 1 tablet (400 mg total) by mouth every 4 (four) hours as needed for mild pain or moderate pain. 30 tablet 0  . lidocaine-prilocaine (EMLA) cream Apply 1 application topically as needed. Apply 1-2 hours prior to chemotherapy 30 g 1  .  LORazepam (ATIVAN) 0.5 MG tablet Take 1 tablet (0.5 mg total) by mouth every 6 (six) hours as needed (Take 0.69m by mouth every 6 hours as needed for Nausea and vomiting). 30 tablet 0  . metFORMIN (GLUCOPHAGE) 500 MG tablet Take 1 tablet (500 mg total) by mouth 2 (two) times daily with a meal. 180 tablet 1  . metoprolol tartrate (LOPRESSOR) 25 MG tablet Take 1 tablet (25 mg total) by mouth 2 (two) times daily. 180 tablet 1  . ondansetron (ZOFRAN) 8 MG tablet Take 1 tablet (8 mg total) by mouth every 8 (eight) hours as needed for nausea or vomiting. 20 tablet 0  . doxycycline (VIBRA-TABS) 100 MG tablet Take 100 mg by mouth 2 (two) times daily.  0  . oxyCODONE-acetaminophen (PERCOCET/ROXICET) 5-325 MG per tablet Take 1 tablet by mouth every 4 (four)  hours as needed for severe pain. (Patient not taking: Reported on 12/28/2014) 20 tablet 0   No current facility-administered medications for this visit.    Review of Systems:  GENERAL: No new problems No fevers or sweats. Weight stable. PERFORMANCE STATUS (ECOG): 1-2 HEENT:No visual changes, runny nose, sore throat, mouth sores or tenderness. Lungs: No shortness of breath or cough. No hemoptysis. Cardiac: No chest pain, palpitations, orthopnea, or PND. GI: No nausea, vomiting, diarrhea, constipation, melena or hematochezia. GU: No urgency, frequency, dysuria, or hematuria. Musculoskeletal: No back pain. No joint pain. No muscle tenderness. Extremities: No pain or swelling. Skin: Chest wall lesions growing.  No spots.  Streaking on chest wall away from incision. Neuro: No headache, numbness or weakness, balance or coordination issues. Endocrine: No diabetes, thyroid issues, hot flashes or night sweats. Psych: No mood changes, depression or anxiety. Pain: No focal pain. Review of systems: All other systems reviewed and found to be negative.  Physical Exam: Blood pressure 162/71, pulse 83, temperature 97.7 F (36.5 C), temperature source Oral, height 5' (1.524 m), weight 110 lb (49.896 kg). GENERAL: Thin elderly woman sitting comfortably in the exam room in no acute distress. MENTAL STATUS: Alert and oriented to person, place and time. HEAD: Short silver wig. Normocephalic, atraumatic, face symmetric, no Cushingoid features. EYES: Blue eyes. Pupils equal round and reactive to light and accomodation. No conjunctivitis or scleral icterus. ENT: Oropharynx clear without lesion. Tongue normal. Mucous membranes moist.  RESPIRATORY: Clear to auscultation without rales, wheezes or rhonchi. CARDIOVASCULAR: Regular rate and rhythm without murmur, rub or gallop. BREAST: Right mastectomy. Nodularity measuring 5.5 x 3.5 cm medially and 6.5 x 3 cm laterally.  Medial  lesion has a halo of erythema extending 2 cm superiorly and inferiorly and 2.5 cm medially.  Areas are heaped up.  Irregular border streaking extends 6.5 cm superiorly and 4 cm inferiorly.  7 mm  and 5 mm nodular areas above incision between 2 larger nodules.  Palpable area laterally along incision in axillae (3 cm x 3 cm).  3 fingertip nodules laterally in axillae. ABDOMEN: Soft, non-tender, with active bowel sounds, and no hepatosplenomegaly. No masses. BACK: Kyphosis. SKIN: Chest wall lesions as above. EXTREMITIES: No edema, no skin discoloration or tenderness. No palpable cords. LYMPH NODES: No palpable cervical, supraclavicular, axillary or inguinal adenopathy  NEUROLOGICAL: Unremarkable. PSYCH: Appropriate.   Infusion on 12/28/2014  Component Date Value Ref Range Status  . WBC 12/28/2014 5.3  3.6 - 11.0 K/uL Final   A-LINE DRAW  . RBC 12/28/2014 3.98  3.80 - 5.20 MIL/uL Final  . Hemoglobin 12/28/2014 10.0* 12.0 - 16.0 g/dL Final  . HCT  12/28/2014 31.4* 35.0 - 47.0 % Final  . MCV 12/28/2014 78.9* 80.0 - 100.0 fL Final  . MCH 12/28/2014 25.1* 26.0 - 34.0 pg Final  . MCHC 12/28/2014 31.8* 32.0 - 36.0 g/dL Final  . RDW 12/28/2014 25.6* 11.5 - 14.5 % Final  . Platelets 12/28/2014 342  150 - 440 K/uL Final  . Neutrophils Relative % 12/28/2014 64   Final  . Neutro Abs 12/28/2014 3.4  1.4 - 6.5 K/uL Final  . Lymphocytes Relative 12/28/2014 24   Final  . Lymphs Abs 12/28/2014 1.2  1.0 - 3.6 K/uL Final  . Monocytes Relative 12/28/2014 10   Final  . Monocytes Absolute 12/28/2014 0.5  0.2 - 0.9 K/uL Final  . Eosinophils Relative 12/28/2014 1   Final  . Eosinophils Absolute 12/28/2014 0.1  0 - 0.7 K/uL Final  . Basophils Relative 12/28/2014 1   Final  . Basophils Absolute 12/28/2014 0.1  0 - 0.1 K/uL Final  . Sodium 12/28/2014 136  135 - 145 mmol/L Final  . Potassium 12/28/2014 3.9  3.5 - 5.1 mmol/L Final  . Chloride 12/28/2014 106  101 - 111 mmol/L Final  . CO2 12/28/2014 24  22 -  32 mmol/L Final  . Glucose, Bld 12/28/2014 147* 65 - 99 mg/dL Final  . BUN 12/28/2014 16  6 - 20 mg/dL Final  . Creatinine, Ser 12/28/2014 0.45  0.44 - 1.00 mg/dL Final  . Calcium 12/28/2014 8.9  8.9 - 10.3 mg/dL Final  . Total Protein 12/28/2014 7.1  6.5 - 8.1 g/dL Final  . Albumin 12/28/2014 3.9  3.5 - 5.0 g/dL Final  . AST 12/28/2014 18  15 - 41 U/L Final  . ALT 12/28/2014 11* 14 - 54 U/L Final  . Alkaline Phosphatase 12/28/2014 65  38 - 126 U/L Final  . Total Bilirubin 12/28/2014 0.6  0.3 - 1.2 mg/dL Final  . GFR calc non Af Amer 12/28/2014 >60  >60 mL/min Final  . GFR calc Af Amer 12/28/2014 >60  >60 mL/min Final   Comment: (NOTE) The eGFR has been calculated using the CKD EPI equation. This calculation has not been validated in all clinical situations. eGFR's persistently <60 mL/min signify possible Chronic Kidney Disease.   . Anion gap 12/28/2014 6  5 - 15 Final    Assessment:  DENEEN SLAGER is an 80 y.o. female with stage IIIC right breast cancer status post mastectomy and axillary lymph node dissection on 08/31/2014. 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 Carcinoma involved skeletal muscle and dermal lymphatics. Tumor was less than 0.5 mm from the deep margin. Thirteen of 15 lymph nodes were involved. The largest metastatic focus was 2 cm. Tumor is triple negative (ER negative, PR negative, and HER-2/neu negative). Pathologic stage was T3N3aMx.  PET scan on 09/21/2014 revealed postoperative changes in the right chest with no suspicious findings or residual tumor. Bone scan on 10/11/2014 revealed no evidence of metastatic disease.  Bone density study on 10/02/2014 revealed a T score of -4.7 in the forearm consistent with osteoporosis. T-score was less than 2.5 in the spine or hip. Echocardiogram on 10/02/2014 revealed ejection fraction of 60-65%.  She has iron deficiency anemia. Labs on 09/19/2014 revealed a  hematocrit of 29.9, hemoglobin 8.7, MCV 69.7, ferritin 9, and TIBC 616. B12 was low (265) with a prior history of B12 deficiency and need for supplementation (stopped 06/2014). She began B12 on 10/12/2014 (last 11/24/2014).  Folate was normal. Her diet is modest.  She has never had a colonoscopy. She denies any melena or hematochezia.  She continues to tolerate 1 iron pill a day.  She is eating red meat.  Hematocrit is stable to slightly improved.  Chest wall biopsy x 3 (lateral, medial, and middle sites) on 11/08/2014 revealed dermal lymphatic involvement by invasive mammary carcinoma with micropapillary features.    She is status post 5 weekly cycles of Taxol (11/03/2014 - 12/01/2014).  She is status post 1 week of adriamycin (12/14/2014).   Symptomatically, she denies any complaint.  She has progressive chest wall disease.  Plan: 1.  Labs today:  CBC with diff, CMP, Mg. 2.  Review chemotherapy plan (carbopatin every 3 weeks).  Side effects of chemotherapy reviewed. 3.  Cycle #1 carboplatin today. 4.  Monthly B12 today. 5.  RTC on 09/12 for nadir assessment and labs (CBC with diff, BMP, Mg).   Dawn Asal, MD  12/28/2014, 6:38 PM

## 2014-12-31 ENCOUNTER — Encounter: Payer: Self-pay | Admitting: Hematology and Oncology

## 2015-01-08 ENCOUNTER — Ambulatory Visit: Payer: PPO | Admitting: Hematology and Oncology

## 2015-01-08 ENCOUNTER — Other Ambulatory Visit: Payer: PPO

## 2015-01-08 ENCOUNTER — Inpatient Hospital Stay (HOSPITAL_BASED_OUTPATIENT_CLINIC_OR_DEPARTMENT_OTHER): Payer: PPO | Admitting: Hematology and Oncology

## 2015-01-08 ENCOUNTER — Inpatient Hospital Stay: Payer: PPO

## 2015-01-08 VITALS — BP 149/74 | HR 86 | Temp 98.3°F | Wt 110.5 lb

## 2015-01-08 DIAGNOSIS — R000 Tachycardia, unspecified: Secondary | ICD-10-CM

## 2015-01-08 DIAGNOSIS — I10 Essential (primary) hypertension: Secondary | ICD-10-CM

## 2015-01-08 DIAGNOSIS — Z87442 Personal history of urinary calculi: Secondary | ICD-10-CM

## 2015-01-08 DIAGNOSIS — C792 Secondary malignant neoplasm of skin: Secondary | ICD-10-CM | POA: Diagnosis not present

## 2015-01-08 DIAGNOSIS — E119 Type 2 diabetes mellitus without complications: Secondary | ICD-10-CM

## 2015-01-08 DIAGNOSIS — C50911 Malignant neoplasm of unspecified site of right female breast: Secondary | ICD-10-CM | POA: Diagnosis not present

## 2015-01-08 DIAGNOSIS — E785 Hyperlipidemia, unspecified: Secondary | ICD-10-CM

## 2015-01-08 DIAGNOSIS — Z171 Estrogen receptor negative status [ER-]: Secondary | ICD-10-CM | POA: Diagnosis not present

## 2015-01-08 DIAGNOSIS — Z9011 Acquired absence of right breast and nipple: Secondary | ICD-10-CM

## 2015-01-08 DIAGNOSIS — Z79899 Other long term (current) drug therapy: Secondary | ICD-10-CM

## 2015-01-08 DIAGNOSIS — I2510 Atherosclerotic heart disease of native coronary artery without angina pectoris: Secondary | ICD-10-CM

## 2015-01-08 DIAGNOSIS — D509 Iron deficiency anemia, unspecified: Secondary | ICD-10-CM

## 2015-01-08 DIAGNOSIS — Z5111 Encounter for antineoplastic chemotherapy: Secondary | ICD-10-CM | POA: Diagnosis not present

## 2015-01-08 DIAGNOSIS — M129 Arthropathy, unspecified: Secondary | ICD-10-CM

## 2015-01-08 DIAGNOSIS — E538 Deficiency of other specified B group vitamins: Secondary | ICD-10-CM

## 2015-01-08 DIAGNOSIS — Z7982 Long term (current) use of aspirin: Secondary | ICD-10-CM

## 2015-01-08 LAB — BASIC METABOLIC PANEL
Anion gap: 7 (ref 5–15)
BUN: 17 mg/dL (ref 6–20)
CO2: 25 mmol/L (ref 22–32)
Calcium: 8.7 mg/dL — ABNORMAL LOW (ref 8.9–10.3)
Chloride: 105 mmol/L (ref 101–111)
Creatinine, Ser: 0.63 mg/dL (ref 0.44–1.00)
GFR calc Af Amer: >60 mL/min (ref >60)
GFR calc non Af Amer: >60 mL/min (ref >60)
Glucose, Bld: 121 mg/dL — ABNORMAL HIGH (ref 65–99)
Potassium: 4.1 mmol/L (ref 3.5–5.1)
Sodium: 137 mmol/L (ref 135–145)

## 2015-01-08 LAB — CBC WITH DIFFERENTIAL/PLATELET
Basophils Absolute: 0.1 10*3/uL (ref 0–0.1)
Basophils Relative: 1 %
Eosinophils Absolute: 0 10*3/uL (ref 0–0.7)
Eosinophils Relative: 1 %
HCT: 32.9 % — ABNORMAL LOW (ref 35.0–47.0)
Hemoglobin: 10.5 g/dL — ABNORMAL LOW (ref 12.0–16.0)
Lymphocytes Relative: 31 %
Lymphs Abs: 1.6 10*3/uL (ref 1.0–3.6)
MCH: 25.9 pg — ABNORMAL LOW (ref 26.0–34.0)
MCHC: 31.9 g/dL — ABNORMAL LOW (ref 32.0–36.0)
MCV: 81 fL (ref 80.0–100.0)
Monocytes Absolute: 0.6 10*3/uL (ref 0.2–0.9)
Monocytes Relative: 11 %
Neutro Abs: 2.9 10*3/uL (ref 1.4–6.5)
Neutrophils Relative %: 56 %
Platelets: 343 10*3/uL (ref 150–440)
RBC: 4.06 MIL/uL (ref 3.80–5.20)
RDW: 25.7 % — ABNORMAL HIGH (ref 11.5–14.5)
WBC: 5.1 10*3/uL (ref 3.6–11.0)

## 2015-01-08 LAB — MAGNESIUM: Magnesium: 1.9 mg/dL (ref 1.7–2.4)

## 2015-01-08 NOTE — Progress Notes (Signed)
[facility] day:  01/08/2015  Chief Complaint: Dawn Wiley is an 80 y.o. female with stage IIIC right breast cancer who is seen for nadir assessment on day 12 of cycle #1 carboplatin.  HPI: The patient was last seen in the medical oncology clinic on 12/28/2014.  At that time, she received cycle #1 carboplatin.  She tolerated her chemotherapy well without side effect.  She specifically denies any nausea or vomiting.  She denies any increased fatigue.  Appetite remains good.  Regarding her breast cancer, she denies any new lesions.   Past Medical History  Diagnosis Date  . Kidney stones   . Hyperlipidemia   . Coronary artery disease     Coronary calcifications noted on CT scan  . Diabetes mellitus without complication   . Nephrolithiasis   . Hypertension   . Arthritis   . Anemia   . Tachycardia     Dr. Fletcher Anon, cardiologist  . Cancer     breast (right)  . PONV (postoperative nausea and vomiting)   . Rash     back  . Breast cancer     Past Surgical History  Procedure Laterality Date  . Temporomandibular joint surgery    . Cataract extraction      bilateral  . Breast surgery      breast biopsy X 3  . Eye surgery      bilateral cataract extraction  . Mastectomy modified radical Right 08/31/2014    Procedure: MASTECTOMY MODIFIED RADICAL;  Surgeon: Molly Maduro, MD;  Location: [facility] ORS;  Service: General;  Laterality: Right;  . Portacath placement Left 10/20/2014    Procedure: INSERTION PORT-A-CATH;  Surgeon: Marlyce Huge, MD;  Location: [facility] ORS;  Service: General;  Laterality: Left;    Family History  Problem Relation Age of Onset  . Peripheral Artery Disease Sister     carotid artery stenosis   . Heart disease Sister   . Diabetes Sister     Social History:  reports that she has never smoked. She has never used smokeless tobacco. She reports that she does not drink alcohol or use illicit drugs.  The patient is  accompanied by her daughter.  Allergies: No Known Allergies  Current Medications: Current Outpatient Prescriptions  Medication Sig Dispense Refill  . acetaminophen (TYLENOL) 500 MG tablet Take 500 mg by mouth every 6 (six) hours as needed for mild pain or moderate pain.     [name] Kitchen amLODipine (NORVASC) 5 MG tablet Take 1 tablet (5 mg total) by mouth daily. 90 tablet 1  . aspirin 81 MG tablet Take 81 mg by mouth every other day.    . Calcium Carbonate (CALTRATE 600 PO) Take 1,200 mg by mouth daily.     . cholecalciferol (VITAMIN D) 400 UNITS TABS tablet Take 800 Units by mouth.    . Cyanocobalamin (VITAMIN B-12 IJ) Inject as directed every 30 (thirty) days.     . diphenhydrAMINE (BENADRYL) 25 mg capsule Take 25 mg by mouth every 6 (six) hours as needed for allergies.    . ferrous sulfate 325 (65 FE) MG tablet Take 325 mg by mouth daily with breakfast.     . ibuprofen (ADVIL,MOTRIN) 400 MG tablet Take 1 tablet (400 mg total) by mouth every 4 (four) hours as needed for mild pain or moderate pain. 30 tablet 0  . lidocaine-prilocaine (EMLA) cream Apply 1 application topically as needed. Apply 1-2 hours prior to chemotherapy 30 g 1  .  LORazepam (ATIVAN) 0.5 MG tablet Take 1 tablet (0.5 mg total) by mouth every 6 (six) hours as needed (Take 0.6m by mouth every 6 hours as needed for Nausea and vomiting). 30 tablet 0  . metFORMIN (GLUCOPHAGE) 500 MG tablet Take 1 tablet (500 mg total) by mouth 2 (two) times daily with a meal. 180 tablet 1  . metoprolol tartrate (LOPRESSOR) 25 MG tablet Take 1 tablet (25 mg total) by mouth 2 (two) times daily. 180 tablet 1  . ondansetron (ZOFRAN) 8 MG tablet Take 1 tablet (8 mg total) by mouth every 8 (eight) hours as needed for nausea or vomiting. 20 tablet 0  . doxycycline (VIBRA-TABS) 100 MG tablet Take 100 mg by mouth 2 (two) times daily.  0  . oxyCODONE-acetaminophen (PERCOCET/ROXICET) 5-325 MG per tablet Take 1 tablet by mouth every 4 (four) hours as needed for severe  pain. (Patient not taking: Reported on 12/28/2014) 20 tablet 0   No current facility-administered medications for this visit.    Review of Systems:  GENERAL: Feels good. No fevers or sweats. Weight stable. PERFORMANCE STATUS (ECOG): 1-2 HEENT:No visual changes, runny nose, sore throat, mouth sores or tenderness. Lungs: No shortness of breath or cough. No hemoptysis. Cardiac: No chest pain, palpitations, orthopnea, or PND. GI: No nausea, vomiting, diarrhea, constipation, melena or hematochezia. GU: No urgency, frequency, dysuria, or hematuria. Musculoskeletal: No back pain. No joint pain. No muscle tenderness. Extremities: No pain or swelling. Skin: Chest wall lesions not growing, maybe better.  No new spots.  Streaking on chest wall away from incision. Neuro: No headache, numbness or weakness, balance or coordination issues. Endocrine: No diabetes, thyroid issues, hot flashes or night sweats. Psych: No mood changes, depression or anxiety. Pain: No focal pain. Review of systems: All other systems reviewed and found to be negative.  Physical Exam: Blood pressure 149/74, pulse 86, temperature 98.3 F (36.8 C), temperature source Oral, weight 110 lb 7.2 oz (50.1 kg). GENERAL: Thin elderly woman sitting comfortably in the exam room in no acute distress. MENTAL STATUS: Alert and oriented to person, place and time. HEAD: Short silver wig. Normocephalic, atraumatic, face symmetric, no Cushingoid features. EYES: Blue eyes. Pupils equal round and reactive to light and accomodation. No conjunctivitis or scleral icterus. ENT: Oropharynx clear without lesion. Tongue normal. Mucous membranes moist.  RESPIRATORY: Clear to auscultation without rales, wheezes or rhonchi. CARDIOVASCULAR: Regular rate and rhythm without murmur, rub or gallop. BREAST: Right mastectomy. Breast lesions more dry.  Nodularity measuring 5 x 3 cm medially and 6 x 3.5 cm laterally.  Medial lesion  has a halo of erythema extending 2 cm inferiorly and and somewhat indistinct margin superiorly and 1 cm medially.  Areas are heaped up, but dry.  Irregular border streaking extends 6.5 cm superiorly and 4 cm inferiorly.  Small nodular areas above incision.  Palpable area laterally along incision in axillae (3 cm x 3 cm), stable.  3 fingertip nodules laterally in axillae. ABDOMEN: Soft, non-tender, with active bowel sounds, and no hepatosplenomegaly. No masses. BACK: Kyphosis. SKIN: Chest wall lesions as above. EXTREMITIES: No edema, no skin discoloration or tenderness. No palpable cords. LYMPH NODES: No palpable cervical, supraclavicular, axillary or inguinal adenopathy  NEUROLOGICAL: Unremarkable. PSYCH: Appropriate.   Appointment on 01/08/2015  Component Date Value Ref Range Status  . WBC 01/08/2015 5.1  3.6 - 11.0 K/uL Final  . RBC 01/08/2015 4.06  3.80 - 5.20 MIL/uL Final  . Hemoglobin 01/08/2015 10.5* 12.0 - 16.0 g/dL Final  . HCT  01/08/2015 32.9* 35.0 - 47.0 % Final  . MCV 01/08/2015 81.0  80.0 - 100.0 fL Final  . MCH 01/08/2015 25.9* 26.0 - 34.0 pg Final  . MCHC 01/08/2015 31.9* 32.0 - 36.0 g/dL Final  . RDW 01/08/2015 25.7* 11.5 - 14.5 % Final  . Platelets 01/08/2015 343  150 - 440 K/uL Final  . Neutrophils Relative % 01/08/2015 56   Final  . Neutro Abs 01/08/2015 2.9  1.4 - 6.5 K/uL Final  . Lymphocytes Relative 01/08/2015 31   Final  . Lymphs Abs 01/08/2015 1.6  1.0 - 3.6 K/uL Final  . Monocytes Relative 01/08/2015 11   Final  . Monocytes Absolute 01/08/2015 0.6  0.2 - 0.9 K/uL Final  . Eosinophils Relative 01/08/2015 1   Final  . Eosinophils Absolute 01/08/2015 0.0  0 - 0.7 K/uL Final  . Basophils Relative 01/08/2015 1   Final  . Basophils Absolute 01/08/2015 0.1  0 - 0.1 K/uL Final  . Sodium 01/08/2015 137  135 - 145 mmol/L Final  . Potassium 01/08/2015 4.1  3.5 - 5.1 mmol/L Final  . Chloride 01/08/2015 105  101 - 111 mmol/L Final  . CO2 01/08/2015 25  22 - 32  mmol/L Final  . Glucose, Bld 01/08/2015 121* 65 - 99 mg/dL Final  . BUN 01/08/2015 17  6 - 20 mg/dL Final  . Creatinine, Ser 01/08/2015 0.63  0.44 - 1.00 mg/dL Final  . Calcium 01/08/2015 8.7* 8.9 - 10.3 mg/dL Final  . GFR calc non Af Amer 01/08/2015 >60  >60 mL/min Final  . GFR calc Af Amer 01/08/2015 >60  >60 mL/min Final   Comment: (NOTE) The eGFR has been calculated using the CKD EPI equation. This calculation has not been validated in all clinical situations. eGFR's persistently <60 mL/min signify possible Chronic Kidney Disease.   . Anion gap 01/08/2015 7  5 - 15 Final  . Magnesium 01/08/2015 1.9  1.7 - 2.4 mg/dL Final    Assessment:  Dawn Wiley is an 80 y.o. female with stage IIIC right breast cancer status post mastectomy and axillary lymph node dissection on 08/31/2014. 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 Carcinoma involved skeletal muscle and dermal lymphatics. Tumor was less than 0.5 mm from the deep margin. Thirteen of 15 lymph nodes were involved. The largest metastatic focus was 2 cm. Tumor is triple negative (ER negative, PR negative, and HER-2/neu negative). Pathologic stage was T3N3aMx.  PET scan on 09/21/2014 revealed postoperative changes in the right chest with no suspicious findings or residual tumor. Bone scan on 10/11/2014 revealed no evidence of metastatic disease.  Bone density study on 10/02/2014 revealed a T score of -4.7 in the forearm consistent with osteoporosis. T-score was less than 2.5 in the spine or hip. Echocardiogram on 10/02/2014 revealed ejection fraction of 60-65%.  She has iron deficiency anemia. Labs on 09/19/2014 revealed a hematocrit of 29.9, hemoglobin 8.7, MCV 69.7, ferritin 9, and TIBC 616. B12 was low (265) with a prior history of B12 deficiency and need for supplementation (stopped 06/2014). She began B12 on 10/12/2014 (last 11/24/2014).  Folate was normal. Her diet  is modest. She has never had a colonoscopy. She denies any melena or hematochezia.  She continues to tolerate 1 iron pill a day.  She is eating red meat.  Hematocrit is stable to slightly improved.  Chest wall biopsy x 3 (lateral, medial, and middle sites) on 11/08/2014 revealed dermal lymphatic involvement by invasive mammary carcinoma with  micropapillary features.    She is status post 5 weekly cycles of Taxol (11/03/2014 - 12/01/2014).  She is status post 1 week of adriamycin (12/14/2014).  She is currently day 12 status post cycle #1 carboplatin (12/28/2014).  She tolerated her chemotherapy well.  Counts are good.  Symptomatically, she denies any complaint.  Cutaneous lesions are stable to possibly slightly improved.  Plan: 1.  Labs today:  CBC with diff, BMP, Mg. 2.  Discuss stability of disease (good) given recent rapidly progressive disease.  Discuss consideration of addition of Taxol weekly for synergistic effect with carboplatin every 3 weeks based on aggressive triple negative disease.  Discussed carboplatin on day 1 and Taxol day 1 and 8 every 3 weeks.  Based on her counts in the past, I believe she will tolerate this well without need for dose reduction or holding therapy.  Discussed alternative of carboplatin with Taxotere every 3 weeks with Neulasta support. 3.  Preauth carboplatin AUC 5 day 1 and weekly Taxol 80 mg/m2 day 1 and 8. 4.  RTC on 09/22 for MD assessment, labs (CBC with diff, CMP, Mg), and carboplatin + Taxol.   [name], MD  01/08/2015, 4:23 PM

## 2015-01-08 NOTE — Progress Notes (Signed)
No Acute chang

## 2015-01-09 ENCOUNTER — Encounter: Payer: Self-pay | Admitting: Hematology and Oncology

## 2015-01-10 ENCOUNTER — Telehealth: Payer: Self-pay

## 2015-01-10 NOTE — Telephone Encounter (Signed)
Called pt per pt's request to let her know her treatment plan was approved.  Pt verbalized an understanding and stated she had an appt on 9/22

## 2015-01-11 ENCOUNTER — Other Ambulatory Visit: Payer: Self-pay | Admitting: Hematology and Oncology

## 2015-01-18 ENCOUNTER — Encounter: Payer: Self-pay | Admitting: Hematology and Oncology

## 2015-01-18 ENCOUNTER — Inpatient Hospital Stay (HOSPITAL_BASED_OUTPATIENT_CLINIC_OR_DEPARTMENT_OTHER): Payer: PPO | Admitting: Hematology and Oncology

## 2015-01-18 ENCOUNTER — Inpatient Hospital Stay: Payer: PPO

## 2015-01-18 VITALS — BP 136/76 | HR 81 | Temp 98.2°F | Resp 18 | Ht 61.0 in | Wt 110.7 lb

## 2015-01-18 DIAGNOSIS — Z5111 Encounter for antineoplastic chemotherapy: Secondary | ICD-10-CM | POA: Diagnosis not present

## 2015-01-18 DIAGNOSIS — E119 Type 2 diabetes mellitus without complications: Secondary | ICD-10-CM

## 2015-01-18 DIAGNOSIS — R000 Tachycardia, unspecified: Secondary | ICD-10-CM

## 2015-01-18 DIAGNOSIS — Z171 Estrogen receptor negative status [ER-]: Secondary | ICD-10-CM | POA: Diagnosis not present

## 2015-01-18 DIAGNOSIS — C50911 Malignant neoplasm of unspecified site of right female breast: Secondary | ICD-10-CM | POA: Diagnosis not present

## 2015-01-18 DIAGNOSIS — D509 Iron deficiency anemia, unspecified: Secondary | ICD-10-CM

## 2015-01-18 DIAGNOSIS — E785 Hyperlipidemia, unspecified: Secondary | ICD-10-CM

## 2015-01-18 DIAGNOSIS — Z87442 Personal history of urinary calculi: Secondary | ICD-10-CM

## 2015-01-18 DIAGNOSIS — C792 Secondary malignant neoplasm of skin: Secondary | ICD-10-CM | POA: Diagnosis not present

## 2015-01-18 DIAGNOSIS — Z79899 Other long term (current) drug therapy: Secondary | ICD-10-CM

## 2015-01-18 DIAGNOSIS — M129 Arthropathy, unspecified: Secondary | ICD-10-CM

## 2015-01-18 DIAGNOSIS — E538 Deficiency of other specified B group vitamins: Secondary | ICD-10-CM

## 2015-01-18 DIAGNOSIS — I10 Essential (primary) hypertension: Secondary | ICD-10-CM

## 2015-01-18 DIAGNOSIS — Z7982 Long term (current) use of aspirin: Secondary | ICD-10-CM

## 2015-01-18 DIAGNOSIS — Z9011 Acquired absence of right breast and nipple: Secondary | ICD-10-CM

## 2015-01-18 LAB — CBC WITH DIFFERENTIAL/PLATELET
Basophils Absolute: 0 10*3/uL (ref 0–0.1)
Basophils Relative: 1 %
Eosinophils Absolute: 0 10*3/uL (ref 0–0.7)
Eosinophils Relative: 1 %
HCT: 33.1 % — ABNORMAL LOW (ref 35.0–47.0)
Hemoglobin: 10.6 g/dL — ABNORMAL LOW (ref 12.0–16.0)
Lymphocytes Relative: 21 %
Lymphs Abs: 1.1 10*3/uL (ref 1.0–3.6)
MCH: 26.5 pg (ref 26.0–34.0)
MCHC: 32.1 g/dL (ref 32.0–36.0)
MCV: 82.5 fL (ref 80.0–100.0)
Monocytes Absolute: 0.5 10*3/uL (ref 0.2–0.9)
Monocytes Relative: 11 %
Neutro Abs: 3.3 10*3/uL (ref 1.4–6.5)
Neutrophils Relative %: 66 %
Platelets: 204 10*3/uL (ref 150–440)
RBC: 4.02 MIL/uL (ref 3.80–5.20)
RDW: 26.3 % — ABNORMAL HIGH (ref 11.5–14.5)
WBC: 5 10*3/uL (ref 3.6–11.0)

## 2015-01-18 LAB — COMPREHENSIVE METABOLIC PANEL
ALT: 9 U/L — ABNORMAL LOW (ref 14–54)
AST: 20 U/L (ref 15–41)
Albumin: 4.3 g/dL (ref 3.5–5.0)
Alkaline Phosphatase: 60 U/L (ref 38–126)
Anion gap: 7 (ref 5–15)
BUN: 16 mg/dL (ref 6–20)
CO2: 24 mmol/L (ref 22–32)
Calcium: 8.7 mg/dL — ABNORMAL LOW (ref 8.9–10.3)
Chloride: 103 mmol/L (ref 101–111)
Creatinine, Ser: 0.61 mg/dL (ref 0.44–1.00)
GFR calc Af Amer: >60 mL/min (ref >60)
GFR calc non Af Amer: >60 mL/min (ref >60)
Glucose, Bld: 183 mg/dL — ABNORMAL HIGH (ref 65–99)
Potassium: 4 mmol/L (ref 3.5–5.1)
Sodium: 134 mmol/L — ABNORMAL LOW (ref 135–145)
Total Bilirubin: 0.5 mg/dL (ref 0.3–1.2)
Total Protein: 7.3 g/dL (ref 6.5–8.1)

## 2015-01-18 MED ORDER — SODIUM CHLORIDE 0.9 % IJ SOLN
10.0000 mL | INTRAMUSCULAR | Status: AC | PRN
  Filled 2015-01-18: qty 10

## 2015-01-18 MED ORDER — HEPARIN SOD (PORK) LOCK FLUSH 100 UNIT/ML IV SOLN
500.0000 [IU] | Freq: Once | INTRAVENOUS | Status: AC
  Administered 2015-01-18: 500 [IU] via INTRAVENOUS
  Filled 2015-01-18: qty 5

## 2015-01-18 MED ORDER — SODIUM CHLORIDE 0.9 % IV SOLN
Freq: Once | INTRAVENOUS | Status: AC
  Administered 2015-01-18: 15:00:00 via INTRAVENOUS
  Filled 2015-01-18: qty 1000

## 2015-01-18 MED ORDER — DIPHENHYDRAMINE HCL 50 MG/ML IJ SOLN
50.0000 mg | Freq: Once | INTRAMUSCULAR | Status: AC
  Administered 2015-01-18: 50 mg via INTRAVENOUS
  Filled 2015-01-18: qty 1

## 2015-01-18 MED ORDER — SODIUM CHLORIDE 0.9 % IV SOLN
Freq: Once | INTRAVENOUS | Status: AC
  Administered 2015-01-18: 16:00:00 via INTRAVENOUS
  Filled 2015-01-18: qty 8

## 2015-01-18 MED ORDER — FAMOTIDINE IN NACL 20-0.9 MG/50ML-% IV SOLN
20.0000 mg | Freq: Two times a day (BID) | INTRAVENOUS | Status: DC
  Administered 2015-01-18: 20 mg via INTRAVENOUS
  Filled 2015-01-18: qty 50

## 2015-01-18 MED ORDER — PACLITAXEL CHEMO INJECTION 300 MG/50ML
80.0000 mg/m2 | Freq: Once | INTRAVENOUS | Status: AC
  Administered 2015-01-18: 120 mg via INTRAVENOUS
  Filled 2015-01-18: qty 20

## 2015-01-18 MED ORDER — SODIUM CHLORIDE 0.9 % IV SOLN
313.5000 mg | Freq: Once | INTRAVENOUS | Status: DC
  Filled 2015-01-18: qty 31

## 2015-01-18 NOTE — Progress Notes (Signed)
Patient here for follow up breast cancer. Denies any pain. No complaints or concerns today. Patient states breast cancer skin inflammation on right chest wall is better since beginning treatment with Carboplatin.

## 2015-01-18 NOTE — Progress Notes (Signed)
[facility] day:  01/18/2015  Chief Complaint: Dawn Wiley is an 80 y.o. female with stage IIIC right breast cancer who is seen for assessment prior cycle #1 carboplatin.  HPI: The patient was last seen in the medical oncology clinic on 01/08/2015.  At that time, she was seen for nadir assessment following cycle #1 carboplatin.  Breast lesions were stable to slightly smaller.  During the interim, she has felt good. She notes her skin is a little itchy and draining. She is unsure if there is been any growth or shrinkage in her chest wall lesions.   Past Medical History  Diagnosis Date  . Kidney stones   . Hyperlipidemia   . Coronary artery disease     Coronary calcifications noted on CT scan  . Diabetes mellitus without complication   . Nephrolithiasis   . Hypertension   . Arthritis   . Anemia   . Tachycardia     Dr. Fletcher Anon, cardiologist  . Cancer     breast (right)  . PONV (postoperative nausea and vomiting)   . Rash     back  . Breast cancer     Past Surgical History  Procedure Laterality Date  . Temporomandibular joint surgery    . Cataract extraction      bilateral  . Breast surgery      breast biopsy X 3  . Eye surgery      bilateral cataract extraction  . Mastectomy modified radical Right 08/31/2014    Procedure: MASTECTOMY MODIFIED RADICAL;  Surgeon: Molly Maduro, MD;  Location: [facility] ORS;  Service: General;  Laterality: Right;  . Portacath placement Left 10/20/2014    Procedure: INSERTION PORT-A-CATH;  Surgeon: Marlyce Huge, MD;  Location: [facility] ORS;  Service: General;  Laterality: Left;    Family History  Problem Relation Age of Onset  . Peripheral Artery Disease Sister     carotid artery stenosis   . Heart disease Sister   . Diabetes Sister     Social History:  reports that she has never smoked. She has never used smokeless tobacco. She reports that she does not drink alcohol or use illicit drugs.   The patient is alone today.  I spoke with her daughter in the infusion center.  Allergies: No Known Allergies  Current Medications: Current Outpatient Prescriptions  Medication Sig Dispense Refill  . acetaminophen (TYLENOL) 500 MG tablet Take 500 mg by mouth every 6 (six) hours as needed for mild pain or moderate pain.     [name] Kitchen amLODipine (NORVASC) 5 MG tablet Take 1 tablet (5 mg total) by mouth daily. 90 tablet 1  . aspirin 81 MG tablet Take 81 mg by mouth every other day.    . Calcium Carbonate (CALTRATE 600 PO) Take 1,200 mg by mouth daily.     . cholecalciferol (VITAMIN D) 400 UNITS TABS tablet Take 800 Units by mouth.    . Cyanocobalamin (VITAMIN B-12 IJ) Inject as directed every 30 (thirty) days.     . diphenhydrAMINE (BENADRYL) 25 mg capsule Take 25 mg by mouth every 6 (six) hours as needed for allergies.    . ferrous sulfate 325 (65 FE) MG tablet Take 325 mg by mouth daily with breakfast.     . lidocaine-prilocaine (EMLA) cream Apply 1 application topically as needed. Apply 1-2 hours prior to chemotherapy 30 g 1  . LORazepam (ATIVAN) 0.5 MG tablet Take 1 tablet (0.5 mg total) by mouth every 6 (six)  hours as needed (Take 0.74m by mouth every 6 hours as needed for Nausea and vomiting). 30 tablet 0  . metFORMIN (GLUCOPHAGE) 500 MG tablet Take 1 tablet (500 mg total) by mouth 2 (two) times daily with a meal. 180 tablet 1  . metoprolol tartrate (LOPRESSOR) 25 MG tablet Take 1 tablet (25 mg total) by mouth 2 (two) times daily. 180 tablet 1  . ondansetron (ZOFRAN) 8 MG tablet Take 1 tablet (8 mg total) by mouth every 8 (eight) hours as needed for nausea or vomiting. 20 tablet 0  . oxyCODONE-acetaminophen (PERCOCET/ROXICET) 5-325 MG per tablet Take 1 tablet by mouth every 4 (four) hours as needed for severe pain. (Patient not taking: Reported on 12/28/2014) 20 tablet 0   No current facility-administered medications for this visit.   Facility-Administered Medications Ordered in Other Visits   Medication Dose Route Frequency Provider Last Rate Last Dose  . heparin lock flush 100 unit/mL  500 Units Intravenous Once MLequita Asal MD      . sodium chloride 0.9 % injection 10 mL  10 mL Intravenous PRN MLequita Asal MD        Review of Systems:  GENERAL: Feels good. No fevers or sweats. Weight stable. PERFORMANCE STATUS (ECOG): 1-2 HEENT:No visual changes, runny nose, sore throat, mouth sores or tenderness. Lungs: No shortness of breath or cough. No hemoptysis. Cardiac: No chest pain, palpitations, orthopnea, or PND. GI: No nausea, vomiting, diarrhea, constipation, melena or hematochezia. GU: No urgency, frequency, dysuria, or hematuria. Musculoskeletal: No back pain. No joint pain. No muscle tenderness. Extremities: No pain or swelling. Skin: Chest wall lesions little itchy and draining.  No new spots.  Streaking on chest wall away from incision. Neuro: No headache, numbness or weakness, balance or coordination issues. Endocrine: No diabetes, thyroid issues, hot flashes or night sweats. Psych: No mood changes, depression or anxiety. Pain: No focal pain. Review of systems: All other systems reviewed and found to be negative.  Physical Exam: Blood pressure 136/76, pulse 81, temperature 98.2 F (36.8 C), temperature source Tympanic, resp. rate 18, height 5' 1"  (1.549 m), weight 110 lb 10.7 oz (50.2 kg). GENERAL: Thin elderly woman sitting comfortably in the exam room in no acute distress. MENTAL STATUS: Alert and oriented to person, place and time. HEAD: Short silver wig. Normocephalic, atraumatic, face symmetric, no Cushingoid features. EYES: Blue eyes. Pupils equal round and reactive to light and accomodation. No conjunctivitis or scleral icterus. ENT: Oropharynx clear without lesion. Tongue normal. Mucous membranes moist.  RESPIRATORY: Clear to auscultation without rales, wheezes or rhonchi. CARDIOVASCULAR: Regular rate and rhythm  without murmur, rub or gallop. BREAST: Right mastectomy. Breast lesions more dry.  Nodularity measuring 6 x 4 cm medially and 6.5 x 3.5 cm laterally.  Medial lesion has a halo of erythema extending 1 cm inferiorly and and somewhat indistinct margin superiorly and 2 cm medially.  Areas are heaped up and dry except medial lesion.  Irregular border streaking extends 6.5 cm superiorly and 4 cm inferiorly.  Small nodular areas above incision.  Palpable area laterally along incision in axillae (3 cm x 3 cm), stable.  3 fingertip nodules in far lateral axillae. ABDOMEN: Soft, non-tender, with active bowel sounds, and no hepatosplenomegaly. No masses. BACK: Kyphosis. SKIN: Chest wall lesions as above. EXTREMITIES: No edema, no skin discoloration or tenderness. No palpable cords. LYMPH NODES: Fullness right axillae.  No palpable cervical, supraclavicular, or inguinal adenopathy  NEUROLOGICAL: Unremarkable. PSYCH: Appropriate.   Infusion on 01/18/2015  Component Date Value Ref Range Status  . WBC 01/18/2015 5.0  3.6 - 11.0 K/uL Final  . RBC 01/18/2015 4.02  3.80 - 5.20 MIL/uL Final  . Hemoglobin 01/18/2015 10.6* 12.0 - 16.0 g/dL Final  . HCT 01/18/2015 33.1* 35.0 - 47.0 % Final  . MCV 01/18/2015 82.5  80.0 - 100.0 fL Final  . MCH 01/18/2015 26.5  26.0 - 34.0 pg Final  . MCHC 01/18/2015 32.1  32.0 - 36.0 g/dL Final  . RDW 01/18/2015 26.3* 11.5 - 14.5 % Final  . Platelets 01/18/2015 204  150 - 440 K/uL Final  . Neutrophils Relative % 01/18/2015 66   Final  . Neutro Abs 01/18/2015 3.3  1.4 - 6.5 K/uL Final  . Lymphocytes Relative 01/18/2015 21   Final  . Lymphs Abs 01/18/2015 1.1  1.0 - 3.6 K/uL Final  . Monocytes Relative 01/18/2015 11   Final  . Monocytes Absolute 01/18/2015 0.5  0.2 - 0.9 K/uL Final  . Eosinophils Relative 01/18/2015 1   Final  . Eosinophils Absolute 01/18/2015 0.0  0 - 0.7 K/uL Final  . Basophils Relative 01/18/2015 1   Final  . Basophils Absolute 01/18/2015 0.0  0 -  0.1 K/uL Final  . Sodium 01/18/2015 134* 135 - 145 mmol/L Final  . Potassium 01/18/2015 4.0  3.5 - 5.1 mmol/L Final  . Chloride 01/18/2015 103  101 - 111 mmol/L Final  . CO2 01/18/2015 24  22 - 32 mmol/L Final  . Glucose, Bld 01/18/2015 183* 65 - 99 mg/dL Final  . BUN 01/18/2015 16  6 - 20 mg/dL Final  . Creatinine, Ser 01/18/2015 0.61  0.44 - 1.00 mg/dL Final  . Calcium 01/18/2015 8.7* 8.9 - 10.3 mg/dL Final  . Total Protein 01/18/2015 7.3  6.5 - 8.1 g/dL Final  . Albumin 01/18/2015 4.3  3.5 - 5.0 g/dL Final  . AST 01/18/2015 20  15 - 41 U/L Final  . ALT 01/18/2015 9* 14 - 54 U/L Final  . Alkaline Phosphatase 01/18/2015 60  38 - 126 U/L Final  . Total Bilirubin 01/18/2015 0.5  0.3 - 1.2 mg/dL Final  . GFR calc non Af Amer 01/18/2015 >60  >60 mL/min Final  . GFR calc Af Amer 01/18/2015 >60  >60 mL/min Final   Comment: (NOTE) The eGFR has been calculated using the CKD EPI equation. This calculation has not been validated in all clinical situations. eGFR's persistently <60 mL/min signify possible Chronic Kidney Disease.   . Anion gap 01/18/2015 7  5 - 15 Final    Assessment:  Dawn Wiley is an 80 y.o. female with stage IIIC right breast cancer status post mastectomy and axillary lymph node dissection on 08/31/2014. 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 Carcinoma involved skeletal muscle and dermal lymphatics. Tumor was less than 0.5 mm from the deep margin. Thirteen of 15 lymph nodes were involved. The largest metastatic focus was 2 cm. Tumor is triple negative (ER negative, PR negative, and HER-2/neu negative). Pathologic stage was [id number].  PET scan on 09/21/2014 revealed postoperative changes in the right chest with no suspicious findings or residual tumor. Bone scan on 10/11/2014 revealed no evidence of metastatic disease.  Bone density study on 10/02/2014 revealed a T score of -4.7 in the forearm consistent with  osteoporosis. T-score was less than 2.5 in the spine or hip. Echocardiogram on 10/02/2014 revealed ejection fraction of 60-65%.  She has iron deficiency anemia. Labs on 09/19/2014 revealed a hematocrit of  29.9, hemoglobin 8.7, MCV 69.7, ferritin 9, and TIBC 616. B12 was low (265) with a prior history of B12 deficiency and need for supplementation (stopped 06/2014). She began B12 on 10/12/2014 (last 11/24/2014).  Folate was normal. Her diet is modest. She has never had a colonoscopy. She denies any melena or hematochezia.  She continues to tolerate 1 iron pill a day.  She is eating red meat.  Hematocrit is stable to slightly improved.  Chest wall biopsy x 3 (lateral, medial, and middle sites) on 11/08/2014 revealed dermal lymphatic involvement by invasive mammary carcinoma with micropapillary features.    She has aggressive disease with growth despite several chemotherapeutic agents.  She received 5 weeks of Taxol (11/03/2014 - 12/01/2014).  She received 1 week of adriamycin (12/14/2014).  She received 1 cycle of  carboplatin (12/28/2014).  After carboplatin, disease appeared to stabilize, but with 2 weeks off of therapy, her disease has slowly grown again (medial lesion larger).  She has tolerated her chemotherapy well.  Counts are good.  Symptomatically, she denies any complaint.  Exam reveals a progressive medial lesion.  Plan: 1.  Labs today:  CBC with diff, CMP. 2.  Discuss slight growth in disease.  Discuss plan to proceed with carboplatin every 3 weeks plus Taxol on week 1 and week 8 with Neulasta support if counts drift down.  Discuss consideration of dose dense AC with Neulasta support if growth again documented.  Patient stated that she would have to think hard whether she wanted to pursue that kind of therapy.  Discussed other alternatives.  Patient requests weekly assessments.  Patient consented to chemotherapy today. 3.  Carboplatin AUC 5 day 1 and weekly Taxol 80 mg/m2 day 1 today. 4.   RTC in 1 week for MD assess, labs (CBC with diff, CMP), day 8 Taxol, and B12. 5.  RTC in 2 weeks for MD assess and labs (CBC with diff).   [name], MD  01/18/2015, 2:15 PM

## 2015-01-25 ENCOUNTER — Inpatient Hospital Stay: Payer: PPO

## 2015-01-25 ENCOUNTER — Other Ambulatory Visit: Payer: Self-pay | Admitting: Hematology and Oncology

## 2015-01-25 ENCOUNTER — Inpatient Hospital Stay: Payer: PPO | Admitting: Hematology and Oncology

## 2015-01-26 ENCOUNTER — Other Ambulatory Visit: Payer: Self-pay | Admitting: Hematology and Oncology

## 2015-01-26 ENCOUNTER — Inpatient Hospital Stay: Payer: PPO

## 2015-01-26 ENCOUNTER — Inpatient Hospital Stay: Payer: PPO | Attending: Hematology and Oncology | Admitting: Hematology and Oncology

## 2015-01-26 VITALS — BP 127/74 | HR 86 | Temp 96.3°F | Resp 18

## 2015-01-26 DIAGNOSIS — E119 Type 2 diabetes mellitus without complications: Secondary | ICD-10-CM | POA: Diagnosis not present

## 2015-01-26 DIAGNOSIS — Z5111 Encounter for antineoplastic chemotherapy: Secondary | ICD-10-CM | POA: Diagnosis present

## 2015-01-26 DIAGNOSIS — M129 Arthropathy, unspecified: Secondary | ICD-10-CM | POA: Diagnosis not present

## 2015-01-26 DIAGNOSIS — D509 Iron deficiency anemia, unspecified: Secondary | ICD-10-CM | POA: Diagnosis not present

## 2015-01-26 DIAGNOSIS — C792 Secondary malignant neoplasm of skin: Secondary | ICD-10-CM | POA: Diagnosis not present

## 2015-01-26 DIAGNOSIS — C50911 Malignant neoplasm of unspecified site of right female breast: Secondary | ICD-10-CM | POA: Diagnosis not present

## 2015-01-26 DIAGNOSIS — E538 Deficiency of other specified B group vitamins: Secondary | ICD-10-CM

## 2015-01-26 DIAGNOSIS — Z87442 Personal history of urinary calculi: Secondary | ICD-10-CM | POA: Insufficient documentation

## 2015-01-26 DIAGNOSIS — E785 Hyperlipidemia, unspecified: Secondary | ICD-10-CM

## 2015-01-26 DIAGNOSIS — Z7982 Long term (current) use of aspirin: Secondary | ICD-10-CM | POA: Diagnosis not present

## 2015-01-26 DIAGNOSIS — Z9011 Acquired absence of right breast and nipple: Secondary | ICD-10-CM

## 2015-01-26 DIAGNOSIS — Z79899 Other long term (current) drug therapy: Secondary | ICD-10-CM

## 2015-01-26 DIAGNOSIS — R000 Tachycardia, unspecified: Secondary | ICD-10-CM | POA: Insufficient documentation

## 2015-01-26 DIAGNOSIS — I10 Essential (primary) hypertension: Secondary | ICD-10-CM | POA: Insufficient documentation

## 2015-01-26 DIAGNOSIS — I2510 Atherosclerotic heart disease of native coronary artery without angina pectoris: Secondary | ICD-10-CM | POA: Insufficient documentation

## 2015-01-26 DIAGNOSIS — Z171 Estrogen receptor negative status [ER-]: Secondary | ICD-10-CM | POA: Diagnosis not present

## 2015-01-26 LAB — COMPREHENSIVE METABOLIC PANEL
ALT: 11 U/L — ABNORMAL LOW (ref 14–54)
AST: 17 U/L (ref 15–41)
Albumin: 4.2 g/dL (ref 3.5–5.0)
Alkaline Phosphatase: 52 U/L (ref 38–126)
Anion gap: 9 (ref 5–15)
BUN: 16 mg/dL (ref 6–20)
CO2: 24 mmol/L (ref 22–32)
Calcium: 8.8 mg/dL — ABNORMAL LOW (ref 8.9–10.3)
Chloride: 103 mmol/L (ref 101–111)
Creatinine, Ser: 0.54 mg/dL (ref 0.44–1.00)
GFR calc Af Amer: >60 mL/min (ref >60)
GFR calc non Af Amer: >60 mL/min (ref >60)
Glucose, Bld: 156 mg/dL — ABNORMAL HIGH (ref 65–99)
Potassium: 3.6 mmol/L (ref 3.5–5.1)
Sodium: 136 mmol/L (ref 135–145)
Total Bilirubin: 0.6 mg/dL (ref 0.3–1.2)
Total Protein: 7 g/dL (ref 6.5–8.1)

## 2015-01-26 LAB — CBC WITH DIFFERENTIAL/PLATELET
Basophils Absolute: 0 10*3/uL (ref 0–0.1)
Basophils Relative: 1 %
Eosinophils Absolute: 0.1 10*3/uL (ref 0–0.7)
Eosinophils Relative: 2 %
HCT: 30.9 % — ABNORMAL LOW (ref 35.0–47.0)
Hemoglobin: 10 g/dL — ABNORMAL LOW (ref 12.0–16.0)
Lymphocytes Relative: 30 %
Lymphs Abs: 1 10*3/uL (ref 1.0–3.6)
MCH: 26.8 pg (ref 26.0–34.0)
MCHC: 32.3 g/dL (ref 32.0–36.0)
MCV: 83 fL (ref 80.0–100.0)
Monocytes Absolute: 0.3 10*3/uL (ref 0.2–0.9)
Monocytes Relative: 9 %
Neutro Abs: 1.9 10*3/uL (ref 1.4–6.5)
Neutrophils Relative %: 58 %
Platelets: 211 10*3/uL (ref 150–440)
RBC: 3.72 MIL/uL — ABNORMAL LOW (ref 3.80–5.20)
RDW: 25.6 % — ABNORMAL HIGH (ref 11.5–14.5)
WBC: 3.3 10*3/uL — ABNORMAL LOW (ref 3.6–11.0)

## 2015-01-26 MED ORDER — SODIUM CHLORIDE 0.9 % IJ SOLN
10.0000 mL | INTRAMUSCULAR | Status: DC | PRN
  Administered 2015-01-26: 10 mL via INTRAVENOUS
  Filled 2015-01-26: qty 10

## 2015-01-26 MED ORDER — CYANOCOBALAMIN 1000 MCG/ML IJ SOLN
1000.0000 ug | Freq: Once | INTRAMUSCULAR | Status: AC
  Administered 2015-01-26: 1000 ug via INTRAMUSCULAR
  Filled 2015-01-26: qty 1

## 2015-01-26 MED ORDER — FAMOTIDINE IN NACL 20-0.9 MG/50ML-% IV SOLN
20.0000 mg | Freq: Two times a day (BID) | INTRAVENOUS | Status: DC
  Administered 2015-01-26: 20 mg via INTRAVENOUS
  Filled 2015-01-26: qty 50

## 2015-01-26 MED ORDER — HEPARIN SOD (PORK) LOCK FLUSH 100 UNIT/ML IV SOLN
500.0000 [IU] | Freq: Once | INTRAVENOUS | Status: AC
Start: 2015-01-26 — End: 2015-01-26
  Administered 2015-01-26: 500 [IU] via INTRAVENOUS
  Filled 2015-01-26: qty 5

## 2015-01-26 MED ORDER — SODIUM CHLORIDE 0.9 % IV SOLN
Freq: Once | INTRAVENOUS | Status: AC
  Administered 2015-01-26: 15:00:00 via INTRAVENOUS
  Filled 2015-01-26: qty 8

## 2015-01-26 MED ORDER — DIPHENHYDRAMINE HCL 50 MG/ML IJ SOLN
50.0000 mg | Freq: Once | INTRAMUSCULAR | Status: AC
  Administered 2015-01-26: 50 mg via INTRAVENOUS
  Filled 2015-01-26: qty 1

## 2015-01-26 MED ORDER — PACLITAXEL CHEMO INJECTION 300 MG/50ML
80.0000 mg/m2 | Freq: Once | INTRAVENOUS | Status: AC
  Administered 2015-01-26: 120 mg via INTRAVENOUS
  Filled 2015-01-26: qty 20

## 2015-01-26 MED ORDER — SODIUM CHLORIDE 0.9 % IV SOLN
INTRAVENOUS | Status: DC
  Administered 2015-01-26: 14:00:00 via INTRAVENOUS
  Filled 2015-01-26: qty 1000

## 2015-01-29 ENCOUNTER — Other Ambulatory Visit: Payer: Self-pay | Admitting: Hematology and Oncology

## 2015-01-29 ENCOUNTER — Inpatient Hospital Stay: Payer: PPO | Admitting: Hematology and Oncology

## 2015-01-29 ENCOUNTER — Encounter: Payer: Self-pay | Admitting: Hematology and Oncology

## 2015-01-29 NOTE — Progress Notes (Signed)
[facility] day:  01/26/2015  Chief Complaint: KRYSTIN KEEVEN is an 80 y.o. female with stage IIIC right breast cancer who is seen for assessment prior to day 8 of cycle #1 carboplatin and Taxol.  HPI: The patient was last seen in the medical oncology clinic on 01/18/2015.  At that time, she had early progressive disease of the medial chest wall lesion.  Decision was made to incorporate Taxol into the regimen.  She received day 1 carboplatin (AUC 5) and weekly Taxol.  She states that she tolerated her chemotherapy well.  She denies any complaints.  She notes less drainage for her chest wall lesions.   She was to have received day 8 yesterday, but because of cancer center issues, returns today on day 9 to receive Taxol.   Past Medical History  Diagnosis Date  . Kidney stones   . Hyperlipidemia   . Coronary artery disease     Coronary calcifications noted on CT scan  . Diabetes mellitus without complication   . Nephrolithiasis   . Hypertension   . Arthritis   . Anemia   . Tachycardia     Dr. Fletcher Anon, cardiologist  . Cancer     breast (right)  . PONV (postoperative nausea and vomiting)   . Rash     back  . Breast cancer     Past Surgical History  Procedure Laterality Date  . Temporomandibular joint surgery    . Cataract extraction      bilateral  . Breast surgery      breast biopsy X 3  . Eye surgery      bilateral cataract extraction  . Mastectomy modified radical Right 08/31/2014    Procedure: MASTECTOMY MODIFIED RADICAL;  Surgeon: Molly Maduro, MD;  Location: [facility] ORS;  Service: General;  Laterality: Right;  . Portacath placement Left 10/20/2014    Procedure: INSERTION PORT-A-CATH;  Surgeon: Marlyce Huge, MD;  Location: [facility] ORS;  Service: General;  Laterality: Left;    Family History  Problem Relation Age of Onset  . Peripheral Artery Disease Sister     carotid artery stenosis   . Heart disease Sister   .  Diabetes Sister     Social History:  reports that she has never smoked. She has never used smokeless tobacco. She reports that she does not drink alcohol or use illicit drugs.  The patient is alone today.  Allergies: No Known Allergies  Current Medications: Current Outpatient Prescriptions  Medication Sig Dispense Refill  . acetaminophen (TYLENOL) 500 MG tablet Take 500 mg by mouth every 6 (six) hours as needed for mild pain or moderate pain.     [name] Kitchen amLODipine (NORVASC) 5 MG tablet Take 1 tablet (5 mg total) by mouth daily. 90 tablet 1  . aspirin 81 MG tablet Take 81 mg by mouth every other day.    . Calcium Carbonate (CALTRATE 600 PO) Take 1,200 mg by mouth daily.     . cholecalciferol (VITAMIN D) 400 UNITS TABS tablet Take 800 Units by mouth.    . Cyanocobalamin (VITAMIN B-12 IJ) Inject as directed every 30 (thirty) days.     . diphenhydrAMINE (BENADRYL) 25 mg capsule Take 25 mg by mouth every 6 (six) hours as needed for allergies.    . ferrous sulfate 325 (65 FE) MG tablet Take 325 mg by mouth daily with breakfast.     . lidocaine-prilocaine (EMLA) cream Apply 1 application topically as needed. Apply 1-2 hours  prior to chemotherapy 30 g 1  . LORazepam (ATIVAN) 0.5 MG tablet Take 1 tablet (0.5 mg total) by mouth every 6 (six) hours as needed (Take 0.25m by mouth every 6 hours as needed for Nausea and vomiting). 30 tablet 0  . metFORMIN (GLUCOPHAGE) 500 MG tablet Take 1 tablet (500 mg total) by mouth 2 (two) times daily with a meal. 180 tablet 1  . metoprolol tartrate (LOPRESSOR) 25 MG tablet Take 1 tablet (25 mg total) by mouth 2 (two) times daily. 180 tablet 1  . ondansetron (ZOFRAN) 8 MG tablet Take 1 tablet (8 mg total) by mouth every 8 (eight) hours as needed for nausea or vomiting. 20 tablet 0  . oxyCODONE-acetaminophen (PERCOCET/ROXICET) 5-325 MG per tablet Take 1 tablet by mouth every 4 (four) hours as needed for severe pain. (Patient not taking: Reported on 12/28/2014) 20 tablet 0    No current facility-administered medications for this visit.   Facility-Administered Medications Ordered in Other Visits  Medication Dose Route Frequency Provider Last Rate Last Dose  . CARBOplatin (PARAPLATIN) 310 mg in sodium chloride 0.9 % 100 mL chemo infusion  310 mg Intravenous Once MLequita Asal MD   310 mg at 01/18/15 1708  . famotidine (PEPCID) IVPB 20 mg premix  20 mg Intravenous Q12H MLequita Asal MD   20 mg at 01/18/15 1511  . sodium chloride 0.9 % injection 10 mL  10 mL Intravenous PRN MLequita Asal MD        Review of Systems:  GENERAL: Feels good. No fevers or sweats. Weight stable. PERFORMANCE STATUS (ECOG): 1-2 HEENT:No visual changes, runny nose, sore throat, mouth sores or tenderness. Lungs: No shortness of breath or cough. No hemoptysis. Cardiac: No chest pain, palpitations, orthopnea, or PND. GI: No nausea, vomiting, diarrhea, constipation, melena or hematochezia. GU: No urgency, frequency, dysuria, or hematuria. Musculoskeletal: No back pain. No joint pain. No muscle tenderness. Extremities: No pain or swelling. Skin: Chest wall lesions draining less.  No new spots.  Streaking on chest wall away from incision. Neuro: No headache, numbness or weakness, balance or coordination issues. Endocrine: No diabetes, thyroid issues, hot flashes or night sweats. Psych: No mood changes, depression or anxiety. Pain: No focal pain. Review of systems: All other systems reviewed and found to be negative.  Physical Exam: There were no vitals taken for this visit. GENERAL: Thin elderly woman sitting comfortably in a private room in the infusion area in no acute distress. MENTAL STATUS: Alert and oriented to person, place and time. HEAD: Short silver wig. Normocephalic, atraumatic, face symmetric, no Cushingoid features. EYES: Blue eyes. No conjunctivitis or scleral icterus. BREAST: Right mastectomy. Breast lesions more dry.   Nodularity measuring 5 x 4 cm medially and 5.5 x 3.5 cm laterally.  Medial lesion has a halo of erythema extending 1 cm inferiorly and and somewhat indistinct margin superiorly and 2 cm medially.  Areas are less heaped up and dry except medial lesion.  Irregular border streaking extends 6.5 cm superiorly and 4 cm inferiorly.  Small nodular areas above incision.  Palpable area laterally along incision in axillae (2.5 cm x 2.5 cm), stable.  3 fingertip nodules in far lateral axillae. BACK: Kyphosis. SKIN: Chest wall lesions as above. EXTREMITIES: No edema, no skin discoloration or tenderness. No palpable cords. LYMPH NODES: Fullness right axillae.   PSYCH: Appropriate.   Infusion on 01/26/2015  Component Date Value Ref Range Status  . WBC 01/26/2015 3.3* 3.6 - 11.0 K/uL Final  .  RBC 01/26/2015 3.72* 3.80 - 5.20 MIL/uL Final  . Hemoglobin 01/26/2015 10.0* 12.0 - 16.0 g/dL Final  . HCT 01/26/2015 30.9* 35.0 - 47.0 % Final  . MCV 01/26/2015 83.0  80.0 - 100.0 fL Final  . MCH 01/26/2015 26.8  26.0 - 34.0 pg Final  . MCHC 01/26/2015 32.3  32.0 - 36.0 g/dL Final  . RDW 01/26/2015 25.6* 11.5 - 14.5 % Final  . Platelets 01/26/2015 211  150 - 440 K/uL Final  . Neutrophils Relative % 01/26/2015 58   Final  . Neutro Abs 01/26/2015 1.9  1.4 - 6.5 K/uL Final  . Lymphocytes Relative 01/26/2015 30   Final  . Lymphs Abs 01/26/2015 1.0  1.0 - 3.6 K/uL Final  . Monocytes Relative 01/26/2015 9   Final  . Monocytes Absolute 01/26/2015 0.3  0.2 - 0.9 K/uL Final  . Eosinophils Relative 01/26/2015 2   Final  . Eosinophils Absolute 01/26/2015 0.1  0 - 0.7 K/uL Final  . Basophils Relative 01/26/2015 1   Final  . Basophils Absolute 01/26/2015 0.0  0 - 0.1 K/uL Final  . Sodium 01/26/2015 136  135 - 145 mmol/L Final  . Potassium 01/26/2015 3.6  3.5 - 5.1 mmol/L Final  . Chloride 01/26/2015 103  101 - 111 mmol/L Final  . CO2 01/26/2015 24  22 - 32 mmol/L Final  . Glucose, Bld 01/26/2015 156* 65 - 99 mg/dL  Final  . BUN 01/26/2015 16  6 - 20 mg/dL Final  . Creatinine, Ser 01/26/2015 0.54  0.44 - 1.00 mg/dL Final  . Calcium 01/26/2015 8.8* 8.9 - 10.3 mg/dL Final  . Total Protein 01/26/2015 7.0  6.5 - 8.1 g/dL Final  . Albumin 01/26/2015 4.2  3.5 - 5.0 g/dL Final  . AST 01/26/2015 17  15 - 41 U/L Final  . ALT 01/26/2015 11* 14 - 54 U/L Final  . Alkaline Phosphatase 01/26/2015 52  38 - 126 U/L Final  . Total Bilirubin 01/26/2015 0.6  0.3 - 1.2 mg/dL Final  . GFR calc non Af Amer 01/26/2015 >60  >60 mL/min Final  . GFR calc Af Amer 01/26/2015 >60  >60 mL/min Final   Comment: (NOTE) The eGFR has been calculated using the CKD EPI equation. This calculation has not been validated in all clinical situations. eGFR's persistently <60 mL/min signify possible Chronic Kidney Disease.   . Anion gap 01/26/2015 9  5 - 15 Final    Assessment:  VIRGIN ZELLERS is an 80 y.o. female with stage IIIC right breast cancer status post mastectomy and axillary lymph node dissection on 08/31/2014. 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 Carcinoma involved skeletal muscle and dermal lymphatics. Tumor was less than 0.5 mm from the deep margin. Thirteen of 15 lymph nodes were involved. The largest metastatic focus was 2 cm. Tumor is triple negative (ER negative, PR negative, and HER-2/neu negative). Pathologic stage was T3N3aMx.  PET scan on 09/21/2014 revealed postoperative changes in the right chest with no suspicious findings or residual tumor. Bone scan on 10/11/2014 revealed no evidence of metastatic disease.  Bone density study on 10/02/2014 revealed a T score of -4.7 in the forearm consistent with osteoporosis. T-score was less than 2.5 in the spine or hip. Echocardiogram on 10/02/2014 revealed ejection fraction of 60-65%.  She has iron deficiency anemia. Labs on 09/19/2014 revealed a hematocrit of 29.9, hemoglobin 8.7, MCV 69.7, ferritin 9, and  TIBC 616. B12 was low (265) with a prior history of  B12 deficiency and need for supplementation (stopped 06/2014). She began B12 on 10/12/2014 (last 01/18/2015).  Folate was normal. Her diet is modest. She has never had a colonoscopy. She denies any melena or hematochezia.  She continues to tolerate 1 iron pill a day.  She is eating red meat.  Hematocrit is stable to slightly improved.  Chest wall biopsy x 3 (lateral, medial, and middle sites) on 11/08/2014 revealed dermal lymphatic involvement by invasive mammary carcinoma with micropapillary features.    She has aggressive disease with growth despite several chemotherapeutic agents.  She received 5 weeks of Taxol (11/03/2014 - 12/01/2014).  She received 1 week of adriamycin (12/14/2014).  She received 1 cycle of  carboplatin (12/28/2014).  After carboplatin, disease appeared to stabilize, but with 2 weeks off of therapy, her disease has slowly grown again (medial lesion larger).   She is currently day 9 of cycle #1 carboplatin and Taxol (09/22/206).  Chest wall lesions appear smaller.   Plan: 1.  Labs today:  CBC with diff, CMP. 2.  Day 8 Taxol today. 3.  Discuss consideration of Neulasta (On-Pro).  Decision made to follow counts. 4.  RTC in 1 weeks for MD assess and labs (CBC with diff).   [name], MD  01/26/2015

## 2015-02-01 ENCOUNTER — Inpatient Hospital Stay: Payer: PPO | Attending: Hematology and Oncology

## 2015-02-01 ENCOUNTER — Other Ambulatory Visit: Payer: Self-pay | Admitting: Hematology and Oncology

## 2015-02-01 ENCOUNTER — Encounter: Payer: Self-pay | Admitting: Hematology and Oncology

## 2015-02-01 ENCOUNTER — Inpatient Hospital Stay (HOSPITAL_BASED_OUTPATIENT_CLINIC_OR_DEPARTMENT_OTHER): Payer: PPO | Admitting: Hematology and Oncology

## 2015-02-01 VITALS — BP 154/72 | HR 81 | Temp 96.0°F | Resp 17 | Ht 60.0 in | Wt 110.7 lb

## 2015-02-01 DIAGNOSIS — Z7982 Long term (current) use of aspirin: Secondary | ICD-10-CM | POA: Diagnosis not present

## 2015-02-01 DIAGNOSIS — E119 Type 2 diabetes mellitus without complications: Secondary | ICD-10-CM | POA: Insufficient documentation

## 2015-02-01 DIAGNOSIS — D509 Iron deficiency anemia, unspecified: Secondary | ICD-10-CM | POA: Diagnosis not present

## 2015-02-01 DIAGNOSIS — E785 Hyperlipidemia, unspecified: Secondary | ICD-10-CM | POA: Diagnosis not present

## 2015-02-01 DIAGNOSIS — M129 Arthropathy, unspecified: Secondary | ICD-10-CM | POA: Insufficient documentation

## 2015-02-01 DIAGNOSIS — Z87442 Personal history of urinary calculi: Secondary | ICD-10-CM | POA: Diagnosis not present

## 2015-02-01 DIAGNOSIS — R21 Rash and other nonspecific skin eruption: Secondary | ICD-10-CM

## 2015-02-01 DIAGNOSIS — R000 Tachycardia, unspecified: Secondary | ICD-10-CM | POA: Diagnosis not present

## 2015-02-01 DIAGNOSIS — I2510 Atherosclerotic heart disease of native coronary artery without angina pectoris: Secondary | ICD-10-CM | POA: Insufficient documentation

## 2015-02-01 DIAGNOSIS — Z171 Estrogen receptor negative status [ER-]: Secondary | ICD-10-CM | POA: Diagnosis not present

## 2015-02-01 DIAGNOSIS — C50911 Malignant neoplasm of unspecified site of right female breast: Secondary | ICD-10-CM | POA: Diagnosis present

## 2015-02-01 DIAGNOSIS — I10 Essential (primary) hypertension: Secondary | ICD-10-CM | POA: Insufficient documentation

## 2015-02-01 DIAGNOSIS — C778 Secondary and unspecified malignant neoplasm of lymph nodes of multiple regions: Secondary | ICD-10-CM | POA: Insufficient documentation

## 2015-02-01 DIAGNOSIS — Z79899 Other long term (current) drug therapy: Secondary | ICD-10-CM | POA: Diagnosis not present

## 2015-02-01 DIAGNOSIS — M40209 Unspecified kyphosis, site unspecified: Secondary | ICD-10-CM | POA: Diagnosis not present

## 2015-02-01 DIAGNOSIS — E538 Deficiency of other specified B group vitamins: Secondary | ICD-10-CM | POA: Diagnosis not present

## 2015-02-01 DIAGNOSIS — Z9011 Acquired absence of right breast and nipple: Secondary | ICD-10-CM | POA: Insufficient documentation

## 2015-02-01 DIAGNOSIS — D649 Anemia, unspecified: Secondary | ICD-10-CM

## 2015-02-01 LAB — CBC WITH DIFFERENTIAL/PLATELET
Basophils Absolute: 0 10*3/uL (ref 0–0.1)
Basophils Relative: 1 %
Eosinophils Absolute: 0 10*3/uL (ref 0–0.7)
Eosinophils Relative: 1 %
HCT: 31.4 % — ABNORMAL LOW (ref 35.0–47.0)
Hemoglobin: 10.2 g/dL — ABNORMAL LOW (ref 12.0–16.0)
Lymphocytes Relative: 43 %
Lymphs Abs: 1 10*3/uL (ref 1.0–3.6)
MCH: 27.4 pg (ref 26.0–34.0)
MCHC: 32.6 g/dL (ref 32.0–36.0)
MCV: 84.1 fL (ref 80.0–100.0)
Monocytes Absolute: 0.2 10*3/uL (ref 0.2–0.9)
Monocytes Relative: 7 %
Neutro Abs: 1.1 10*3/uL — ABNORMAL LOW (ref 1.4–6.5)
Neutrophils Relative %: 48 %
Platelets: 256 10*3/uL (ref 150–440)
RBC: 3.73 MIL/uL — ABNORMAL LOW (ref 3.80–5.20)
RDW: 25.2 % — ABNORMAL HIGH (ref 11.5–14.5)
WBC: 2.3 10*3/uL — ABNORMAL LOW (ref 3.6–11.0)

## 2015-02-01 LAB — COMPREHENSIVE METABOLIC PANEL
ALT: 10 U/L — ABNORMAL LOW (ref 14–54)
AST: 19 U/L (ref 15–41)
Albumin: 4.4 g/dL (ref 3.5–5.0)
Alkaline Phosphatase: 44 U/L (ref 38–126)
Anion gap: 5 (ref 5–15)
BUN: 15 mg/dL (ref 6–20)
CO2: 25 mmol/L (ref 22–32)
Calcium: 9.1 mg/dL (ref 8.9–10.3)
Chloride: 105 mmol/L (ref 101–111)
Creatinine, Ser: 0.52 mg/dL (ref 0.44–1.00)
GFR calc Af Amer: >60 mL/min (ref >60)
GFR calc non Af Amer: >60 mL/min (ref >60)
Glucose, Bld: 137 mg/dL — ABNORMAL HIGH (ref 65–99)
Potassium: 4.6 mmol/L (ref 3.5–5.1)
Sodium: 135 mmol/L (ref 135–145)
Total Bilirubin: 0.5 mg/dL (ref 0.3–1.2)
Total Protein: 7.4 g/dL (ref 6.5–8.1)

## 2015-02-01 NOTE — Progress Notes (Signed)
No changes since last visit.  Follow up breast

## 2015-02-01 NOTE — Progress Notes (Signed)
[facility] day:  02/01/2015   Chief Complaint: Dawn Wiley is an 80 y.o. female with stage IIIC right breast cancer who is seen for assessment on day 15 of cycle #1 carboplatin and Taxol.  HPI: The patient was last seen in the medical oncology clinic on 01/26/2015.  At that time, she was day 9 of cycle #1 carboplatin and Taxol.  Chest wall lesions appear smaller.  Counts were good ([place] [address]).  She received day 8 Taxol.  Decision was made to postpone use of Neulasta.  During the interim, she has done well.  She denies any side effects of chemotherapy.  She believes her chest wall lesions are smaller and "less heaped up".  Drainage is less.   Past Medical History  Diagnosis Date  . Kidney stones   . Hyperlipidemia   . Coronary artery disease     Coronary calcifications noted on CT scan  . Diabetes mellitus without complication ([place])   . Nephrolithiasis   . Hypertension   . Arthritis   . Anemia   . Tachycardia     Dr. Fletcher Anon, cardiologist  . Cancer [facility])     breast (right)  . PONV (postoperative nausea and vomiting)   . Rash     back  . Breast cancer [facility])     Past Surgical History  Procedure Laterality Date  . Temporomandibular joint surgery    . Cataract extraction      bilateral  . Breast surgery      breast biopsy X 3  . Eye surgery      bilateral cataract extraction  . Mastectomy modified radical Right 08/31/2014    Procedure: MASTECTOMY MODIFIED RADICAL;  Surgeon: Molly Maduro, MD;  Location: [facility] ORS;  Service: General;  Laterality: Right;  . Portacath placement Left 10/20/2014    Procedure: INSERTION PORT-A-CATH;  Surgeon: Marlyce Huge, MD;  Location: [facility] ORS;  Service: General;  Laterality: Left;    Family History  Problem Relation Age of Onset  . Peripheral Artery Disease Sister     carotid artery stenosis   . Heart disease Sister   . Diabetes Sister     Social History:  reports that she has  never smoked. She has never used smokeless tobacco. She reports that she does not drink alcohol or use illicit drugs.  The patient is alone today.  Allergies: No Known Allergies  Current Medications: Current Outpatient Prescriptions  Medication Sig Dispense Refill  . acetaminophen (TYLENOL) 500 MG tablet Take 500 mg by mouth every 6 (six) hours as needed for mild pain or moderate pain.     [name] Kitchen amLODipine (NORVASC) 5 MG tablet Take 1 tablet (5 mg total) by mouth daily. 90 tablet 1  . aspirin 81 MG tablet Take 81 mg by mouth every other day.    . Calcium Carbonate (CALTRATE 600 PO) Take 1,200 mg by mouth daily.     . cholecalciferol (VITAMIN D) 400 UNITS TABS tablet Take 800 Units by mouth.    . Cyanocobalamin (VITAMIN B-12 IJ) Inject as directed every 30 (thirty) days.     . diphenhydrAMINE (BENADRYL) 25 mg capsule Take 25 mg by mouth every 6 (six) hours as needed for allergies.    . ferrous sulfate 325 (65 FE) MG tablet Take 325 mg by mouth daily with breakfast.     . lidocaine-prilocaine (EMLA) cream Apply 1 application topically as needed. Apply 1-2 hours prior to chemotherapy 30 g 1  .  LORazepam (ATIVAN) 0.5 MG tablet Take 1 tablet (0.5 mg total) by mouth every 6 (six) hours as needed (Take 0.13m by mouth every 6 hours as needed for Nausea and vomiting). 30 tablet 0  . metFORMIN (GLUCOPHAGE) 500 MG tablet Take 1 tablet (500 mg total) by mouth 2 (two) times daily with a meal. 180 tablet 1  . metoprolol tartrate (LOPRESSOR) 25 MG tablet Take 1 tablet (25 mg total) by mouth 2 (two) times daily. 180 tablet 1  . ondansetron (ZOFRAN) 8 MG tablet Take 1 tablet (8 mg total) by mouth every 8 (eight) hours as needed for nausea or vomiting. 20 tablet 0  . oxyCODONE-acetaminophen (PERCOCET/ROXICET) 5-325 MG per tablet Take 1 tablet by mouth every 4 (four) hours as needed for severe pain. 20 tablet 0   No current facility-administered medications for this visit.   Facility-Administered Medications  Ordered in Other Visits  Medication Dose Route Frequency Provider Last Rate Last Dose  . CARBOplatin (PARAPLATIN) 310 mg in sodium chloride 0.9 % 100 mL chemo infusion  310 mg Intravenous Once MLequita Asal MD   310 mg at 01/18/15 1708  . famotidine (PEPCID) IVPB 20 mg premix  20 mg Intravenous Q12H MLequita Asal MD   20 mg at 01/18/15 1511  . sodium chloride 0.9 % injection 10 mL  10 mL Intravenous PRN MLequita Asal MD        Review of Systems:  GENERAL: Feels good. No fevers or sweats. Weight stable. PERFORMANCE STATUS (ECOG): 1 HEENT:No visual changes, runny nose, sore throat, mouth sores or tenderness. Lungs: No shortness of breath or cough. No hemoptysis. Cardiac: No chest pain, palpitations, orthopnea, or PND. GI: No nausea, vomiting, diarrhea, constipation, melena or hematochezia. GU: No urgency, frequency, dysuria, or hematuria. Musculoskeletal: No back pain. No joint pain. No muscle tenderness. Extremities: No pain or swelling. Skin: Chest wall lesions smaller and draining less.  No new spots.  Streaking on chest wall similar. Neuro: No headache, numbness or weakness, balance or coordination issues. Endocrine: No diabetes, thyroid issues, hot flashes or night sweats. Psych: No mood changes, depression or anxiety. Pain: No focal pain. Review of systems: All other systems reviewed and found to be negative.  Physical Exam: Blood pressure 154/72, pulse 81, temperature 96 F (35.6 C), temperature source Tympanic, resp. rate 17, height 5' (1.524 m), weight 110 lb 10.7 oz (50.2 kg). GENERAL: Thin elderly woman sitting comfortably in a private room in the infusion area in no acute distress. MENTAL STATUS: Alert and oriented to person, place and time. HEAD: Short silver wig. Normocephalic, atraumatic, face symmetric, no Cushingoid features. EYES: Blue eyes. No conjunctivitis or scleral icterus. BREAST: Right mastectomy. Breast lesions dry.   Nodularity measuring 5 x 3.5 cm medially (softer) and 4 x 3.5 cm laterally (less distinct).  Medial lesion has a halo of erythema extending 1 cm inferiorly, 1.5 cm superiorly and 2 cm medially.  Areas are flatter and dry (no overlying bandage today).  Irregular border streaking extends 6.5-7  cm superiorly and 4 cm inferiorly.  Small nodular areas above incision.  Palpable area laterally along incision in axillae (2.5 cm x 2.5 cm), stable.  3 fingertip nodules in far lateral axillae. BACK: Kyphosis. SKIN: Chest wall lesions as above. EXTREMITIES: No edema, no skin discoloration or tenderness. No palpable cords. LYMPH NODES: Fullness right axillae (softer and slightly less prominent).   PSYCH: Appropriate.   Appointment on 02/01/2015  Component Date Value Ref Range Status  . WBC  02/01/2015 2.3* 3.6 - 11.0 K/uL Final  . RBC 02/01/2015 3.73* 3.80 - 5.20 MIL/uL Final  . Hemoglobin 02/01/2015 10.2* 12.0 - 16.0 g/dL Final  . HCT 02/01/2015 31.4* 35.0 - 47.0 % Final  . MCV 02/01/2015 84.1  80.0 - 100.0 fL Final  . MCH 02/01/2015 27.4  26.0 - 34.0 pg Final  . MCHC 02/01/2015 32.6  32.0 - 36.0 g/dL Final  . RDW 02/01/2015 25.2* 11.5 - 14.5 % Final  . Platelets 02/01/2015 256  150 - 440 K/uL Final  . Neutrophils Relative % 02/01/2015 48   Final  . Neutro Abs 02/01/2015 1.1* 1.4 - 6.5 K/uL Final  . Lymphocytes Relative 02/01/2015 43   Final  . Lymphs Abs 02/01/2015 1.0  1.0 - 3.6 K/uL Final  . Monocytes Relative 02/01/2015 7   Final  . Monocytes Absolute 02/01/2015 0.2  0.2 - 0.9 K/uL Final  . Eosinophils Relative 02/01/2015 1   Final  . Eosinophils Absolute 02/01/2015 0.0  0 - 0.7 K/uL Final  . Basophils Relative 02/01/2015 1   Final  . Basophils Absolute 02/01/2015 0.0  0 - 0.1 K/uL Final  . Sodium 02/01/2015 135  135 - 145 mmol/L Final  . Potassium 02/01/2015 4.6  3.5 - 5.1 mmol/L Final  . Chloride 02/01/2015 105  101 - 111 mmol/L Final  . CO2 02/01/2015 25  22 - 32 mmol/L Final  .  Glucose, Bld 02/01/2015 137* 65 - 99 mg/dL Final  . BUN 02/01/2015 15  6 - 20 mg/dL Final  . Creatinine, Ser 02/01/2015 0.52  0.44 - 1.00 mg/dL Final  . Calcium 02/01/2015 9.1  8.9 - 10.3 mg/dL Final  . Total Protein 02/01/2015 7.4  6.5 - 8.1 g/dL Final  . Albumin 02/01/2015 4.4  3.5 - 5.0 g/dL Final  . AST 02/01/2015 19  15 - 41 U/L Final  . ALT 02/01/2015 10* 14 - 54 U/L Final  . Alkaline Phosphatase 02/01/2015 44  38 - 126 U/L Final  . Total Bilirubin 02/01/2015 0.5  0.3 - 1.2 mg/dL Final  . GFR calc non Af Amer 02/01/2015 >60  >60 mL/min Final  . GFR calc Af Amer 02/01/2015 >60  >60 mL/min Final   Comment: (NOTE) The eGFR has been calculated using the CKD EPI equation. This calculation has not been validated in all clinical situations. eGFR's persistently <60 mL/min signify possible Chronic Kidney Disease.   . Anion gap 02/01/2015 5  5 - 15 Final    Assessment:  BARNEY GERTSCH is an 80 y.o. female with stage IIIC right breast cancer status post mastectomy and axillary lymph node dissection on 08/31/2014. 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 Carcinoma involved skeletal muscle and dermal lymphatics. Tumor was less than 0.5 mm from the deep margin. Thirteen of 15 lymph nodes were involved. The largest metastatic focus was 2 cm. Tumor is triple negative (ER negative, PR negative, and HER-2/neu negative). Pathologic stage was T3N3aMx.  PET scan on 09/21/2014 revealed postoperative changes in the right chest with no suspicious findings or residual tumor. Bone scan on 10/11/2014 revealed no evidence of metastatic disease.  Bone density study on 10/02/2014 revealed a T score of -4.7 in the forearm consistent with osteoporosis. T-score was less than 2.5 in the spine or hip. Echocardiogram on 10/02/2014 revealed ejection fraction of 60-65%.  She has iron deficiency anemia. Labs on 09/19/2014 revealed a hematocrit of 29.9,  hemoglobin 8.7, MCV 69.7, ferritin 9, and TIBC 616.  B12 was low (265) with a prior history of B12 deficiency and need for supplementation (stopped 06/2014). She began B12 on 10/12/2014 (last 01/18/2015).  Folate was normal. Her diet is modest. She has never had a colonoscopy. She denies any melena or hematochezia.  She continues to tolerate 1 iron pill a day.  She is eating red meat.  Hematocrit is stable to slightly improved.  Chest wall biopsy x 3 (lateral, medial, and middle sites) on 11/08/2014 revealed dermal lymphatic involvement by invasive mammary carcinoma with micropapillary features.    She has aggressive disease with growth despite several chemotherapeutic agents.  She received 5 weeks of Taxol (11/03/2014 - 12/01/2014).  She received 1 week of adriamycin (12/14/2014).  She received 1 cycle of  carboplatin (12/28/2014).  After carboplatin, disease appeared to stabilize, but with 2 weeks off of therapy, her disease grew again (medial lesion larger and weaping).   She is currently day 15 of cycle #1 carboplatin and Taxol (09/22/206).  Chest wall lesions are smaller, softer, and dry.   Plan: 1.  Labs today:  CBC with diff. 2.  RTC in 1 week for MD assess, labs (CBC with diff, CMP, Mg), and day 1 of cycle #2 carboplatin and Taxol.   [name], MD  02/01/2015 , 3:46 PM

## 2015-02-09 ENCOUNTER — Inpatient Hospital Stay: Payer: PPO

## 2015-02-09 ENCOUNTER — Inpatient Hospital Stay (HOSPITAL_BASED_OUTPATIENT_CLINIC_OR_DEPARTMENT_OTHER): Payer: PPO | Admitting: Hematology and Oncology

## 2015-02-09 VITALS — BP 130/77 | HR 80 | Temp 96.4°F | Resp 20

## 2015-02-09 DIAGNOSIS — C778 Secondary and unspecified malignant neoplasm of lymph nodes of multiple regions: Secondary | ICD-10-CM | POA: Diagnosis not present

## 2015-02-09 DIAGNOSIS — Z9011 Acquired absence of right breast and nipple: Secondary | ICD-10-CM

## 2015-02-09 DIAGNOSIS — I2510 Atherosclerotic heart disease of native coronary artery without angina pectoris: Secondary | ICD-10-CM

## 2015-02-09 DIAGNOSIS — Z7982 Long term (current) use of aspirin: Secondary | ICD-10-CM

## 2015-02-09 DIAGNOSIS — M40209 Unspecified kyphosis, site unspecified: Secondary | ICD-10-CM

## 2015-02-09 DIAGNOSIS — E538 Deficiency of other specified B group vitamins: Secondary | ICD-10-CM

## 2015-02-09 DIAGNOSIS — I10 Essential (primary) hypertension: Secondary | ICD-10-CM

## 2015-02-09 DIAGNOSIS — C50911 Malignant neoplasm of unspecified site of right female breast: Secondary | ICD-10-CM

## 2015-02-09 DIAGNOSIS — M129 Arthropathy, unspecified: Secondary | ICD-10-CM

## 2015-02-09 DIAGNOSIS — Z87442 Personal history of urinary calculi: Secondary | ICD-10-CM

## 2015-02-09 DIAGNOSIS — Z171 Estrogen receptor negative status [ER-]: Secondary | ICD-10-CM | POA: Diagnosis not present

## 2015-02-09 DIAGNOSIS — D509 Iron deficiency anemia, unspecified: Secondary | ICD-10-CM

## 2015-02-09 DIAGNOSIS — Z79899 Other long term (current) drug therapy: Secondary | ICD-10-CM

## 2015-02-09 DIAGNOSIS — E119 Type 2 diabetes mellitus without complications: Secondary | ICD-10-CM

## 2015-02-09 DIAGNOSIS — E785 Hyperlipidemia, unspecified: Secondary | ICD-10-CM

## 2015-02-09 DIAGNOSIS — R000 Tachycardia, unspecified: Secondary | ICD-10-CM

## 2015-02-09 DIAGNOSIS — R21 Rash and other nonspecific skin eruption: Secondary | ICD-10-CM

## 2015-02-09 LAB — COMPREHENSIVE METABOLIC PANEL
ALT: 15 U/L (ref 14–54)
AST: 20 U/L (ref 15–41)
Albumin: 4.2 g/dL (ref 3.5–5.0)
Alkaline Phosphatase: 50 U/L (ref 38–126)
Anion gap: 6 (ref 5–15)
BUN: 16 mg/dL (ref 6–20)
CO2: 25 mmol/L (ref 22–32)
Calcium: 8.8 mg/dL — ABNORMAL LOW (ref 8.9–10.3)
Chloride: 104 mmol/L (ref 101–111)
Creatinine, Ser: 0.48 mg/dL (ref 0.44–1.00)
GFR calc Af Amer: >60 mL/min (ref >60)
GFR calc non Af Amer: >60 mL/min (ref >60)
Glucose, Bld: 166 mg/dL — ABNORMAL HIGH (ref 65–99)
Potassium: 3.8 mmol/L (ref 3.5–5.1)
Sodium: 135 mmol/L (ref 135–145)
Total Bilirubin: 0.6 mg/dL (ref 0.3–1.2)
Total Protein: 6.9 g/dL (ref 6.5–8.1)

## 2015-02-09 LAB — CBC WITH DIFFERENTIAL/PLATELET
Basophils Absolute: 0 10*3/uL (ref 0–0.1)
Basophils Relative: 1 %
Eosinophils Absolute: 0 10*3/uL (ref 0–0.7)
Eosinophils Relative: 0 %
HCT: 32.3 % — ABNORMAL LOW (ref 35.0–47.0)
Hemoglobin: 10.4 g/dL — ABNORMAL LOW (ref 12.0–16.0)
Lymphocytes Relative: 26 %
Lymphs Abs: 0.8 10*3/uL — ABNORMAL LOW (ref 1.0–3.6)
MCH: 26.9 pg (ref 26.0–34.0)
MCHC: 32.1 g/dL (ref 32.0–36.0)
MCV: 84 fL (ref 80.0–100.0)
Monocytes Absolute: 0.5 10*3/uL (ref 0.2–0.9)
Monocytes Relative: 15 %
Neutro Abs: 1.7 10*3/uL (ref 1.4–6.5)
Neutrophils Relative %: 58 %
Platelets: 255 10*3/uL (ref 150–440)
RBC: 3.85 MIL/uL (ref 3.80–5.20)
RDW: 24.3 % — ABNORMAL HIGH (ref 11.5–14.5)
WBC: 3 10*3/uL — ABNORMAL LOW (ref 3.6–11.0)

## 2015-02-09 MED ORDER — SODIUM CHLORIDE 0.9 % IV SOLN
313.5000 mg | Freq: Once | INTRAVENOUS | Status: AC
  Administered 2015-02-09: 310 mg via INTRAVENOUS
  Filled 2015-02-09: qty 31

## 2015-02-09 MED ORDER — FAMOTIDINE IN NACL 20-0.9 MG/50ML-% IV SOLN
20.0000 mg | Freq: Two times a day (BID) | INTRAVENOUS | Status: DC
  Administered 2015-02-09: 20 mg via INTRAVENOUS
  Filled 2015-02-09: qty 50

## 2015-02-09 MED ORDER — SODIUM CHLORIDE 0.9 % IV SOLN
Freq: Once | INTRAVENOUS | Status: AC
  Administered 2015-02-09: 12:00:00 via INTRAVENOUS
  Filled 2015-02-09: qty 1000

## 2015-02-09 MED ORDER — PACLITAXEL CHEMO INJECTION 300 MG/50ML
80.0000 mg/m2 | Freq: Once | INTRAVENOUS | Status: AC
  Administered 2015-02-09: 120 mg via INTRAVENOUS
  Filled 2015-02-09: qty 20

## 2015-02-09 MED ORDER — DIPHENHYDRAMINE HCL 50 MG/ML IJ SOLN
50.0000 mg | Freq: Once | INTRAMUSCULAR | Status: AC
  Administered 2015-02-09: 50 mg via INTRAVENOUS
  Filled 2015-02-09: qty 1

## 2015-02-09 MED ORDER — SODIUM CHLORIDE 0.9 % IV SOLN
Freq: Once | INTRAVENOUS | Status: AC
  Administered 2015-02-09: 12:00:00 via INTRAVENOUS
  Filled 2015-02-09: qty 8

## 2015-02-09 MED ORDER — HEPARIN SOD (PORK) LOCK FLUSH 100 UNIT/ML IV SOLN
500.0000 [IU] | Freq: Once | INTRAVENOUS | Status: AC | PRN
  Administered 2015-02-09: 500 [IU]
  Filled 2015-02-09: qty 5

## 2015-02-09 NOTE — Progress Notes (Signed)
[facility] day:  02/09/2015  Chief Complaint: Dawn Wiley is an 80 y.o. female with stage IIIC right breast cancer who is seen for assessment on day 1 of cycle #2 carboplatin and Taxol.  HPI: The patient was last seen in the medical oncology clinic on 02/01/2015.  At that time, she was day 15 of cycle #1 carboplatin and Taxol.  Chest wall lesions were smaller, softer, and dry.  Counts were adequate ([facility] 1100).  During the interim, she denies any problems. She notes a little bit of itching superiorly on her right chest wall. She has had no drainage in the past week. The central lesion may be "puffed up" a little. Overall she feels that things are looking better.  Past Medical History  Diagnosis Date  . Kidney stones   . Hyperlipidemia   . Coronary artery disease     Coronary calcifications noted on CT scan  . Diabetes mellitus without complication ([place])   . Nephrolithiasis   . Hypertension   . Arthritis   . Anemia   . Tachycardia     Dr. Fletcher Anon, cardiologist  . Cancer [facility])     breast (right)  . PONV (postoperative nausea and vomiting)   . Rash     back  . Breast cancer [facility])     Past Surgical History  Procedure Laterality Date  . Temporomandibular joint surgery    . Cataract extraction      bilateral  . Breast surgery      breast biopsy X 3  . Eye surgery      bilateral cataract extraction  . Mastectomy modified radical Right 08/31/2014    Procedure: MASTECTOMY MODIFIED RADICAL;  Surgeon: Molly Maduro, MD;  Location: [facility] ORS;  Service: General;  Laterality: Right;  . Portacath placement Left 10/20/2014    Procedure: INSERTION PORT-A-CATH;  Surgeon: Marlyce Huge, MD;  Location: [facility] ORS;  Service: General;  Laterality: Left;    Family History  Problem Relation Age of Onset  . Peripheral Artery Disease Sister     carotid artery stenosis   . Heart disease Sister   . Diabetes Sister     Social History:   reports that she has never smoked. She has never used smokeless tobacco. She reports that she does not drink alcohol or use illicit drugs.  The patient is alone today.  Allergies: No Known Allergies  Current Medications: Current Outpatient Prescriptions  Medication Sig Dispense Refill  . acetaminophen (TYLENOL) 500 MG tablet Take 500 mg by mouth every 6 (six) hours as needed for mild pain or moderate pain.     [name] Kitchen aspirin 81 MG tablet Take 81 mg by mouth every other day.    . Calcium Carbonate (CALTRATE 600 PO) Take 1,200 mg by mouth daily.     . cholecalciferol (VITAMIN D) 400 UNITS TABS tablet Take 800 Units by mouth.    . Cyanocobalamin (VITAMIN B-12 IJ) Inject as directed every 30 (thirty) days.     . diphenhydrAMINE (BENADRYL) 25 mg capsule Take 25 mg by mouth every 6 (six) hours as needed for allergies.    . ferrous sulfate 325 (65 FE) MG tablet Take 325 mg by mouth daily with breakfast.     . lidocaine-prilocaine (EMLA) cream Apply 1 application topically as needed. Apply 1-2 hours prior to chemotherapy 30 g 1  . LORazepam (ATIVAN) 0.5 MG tablet Take 1 tablet (0.5 mg total) by mouth every 6 (six) hours as  needed (Take 0.77m by mouth every 6 hours as needed for Nausea and vomiting). 30 tablet 0  . metFORMIN (GLUCOPHAGE) 500 MG tablet Take 1 tablet (500 mg total) by mouth 2 (two) times daily with a meal. 180 tablet 1  . metoprolol tartrate (LOPRESSOR) 25 MG tablet Take 1 tablet (25 mg total) by mouth 2 (two) times daily. 180 tablet 1  . ondansetron (ZOFRAN) 8 MG tablet Take 1 tablet (8 mg total) by mouth every 8 (eight) hours as needed for nausea or vomiting. 20 tablet 0  . oxyCODONE-acetaminophen (PERCOCET/ROXICET) 5-325 MG per tablet Take 1 tablet by mouth every 4 (four) hours as needed for severe pain. 20 tablet 0  . amLODipine (NORVASC) 5 MG tablet take 1 tablet by mouth once daily 90 tablet 1   No current facility-administered medications for this visit.   Facility-Administered  Medications Ordered in Other Visits  Medication Dose Route Frequency Provider Last Rate Last Dose  . CARBOplatin (PARAPLATIN) 310 mg in sodium chloride 0.9 % 100 mL chemo infusion  310 mg Intravenous Once MLequita Asal MD   310 mg at 01/18/15 1708  . famotidine (PEPCID) IVPB 20 mg premix  20 mg Intravenous Q12H MLequita Asal MD   20 mg at 01/18/15 1511  . sodium chloride 0.9 % injection 10 mL  10 mL Intravenous PRN MLequita Asal MD        Review of Systems:  GENERAL: Feels good. No fevers or sweats. Weight stable. PERFORMANCE STATUS (ECOG): 1 HEENT:No visual changes, runny nose, sore throat, mouth sores or tenderness. Lungs: No shortness of breath or cough. No hemoptysis. Cardiac: No chest pain, palpitations, orthopnea, or PND. GI: No nausea, vomiting, diarrhea, constipation, melena or hematochezia. GU: No urgency, frequency, dysuria, or hematuria. Musculoskeletal: No back pain. No joint pain. No muscle tenderness. Extremities: No pain or swelling. Skin: Chest wall lesions may be better.  No new spots.  Streaking on chest wall similar. Neuro: No headache, numbness or weakness, balance or coordination issues. Endocrine: No diabetes, thyroid issues, hot flashes or night sweats. Psych: No mood changes, depression or anxiety. Pain: No focal pain. Review of systems: All other systems reviewed and found to be negative.  Physical Exam: There were no vitals taken for this visit. GENERAL: Thin elderly woman sitting comfortably in a private room in the infusion area in no acute distress. MENTAL STATUS: Alert and oriented to person, place and time. HEAD: Short silver wig. Normocephalic, atraumatic, face symmetric, no Cushingoid features. EYES:  Blue eyes.  Pupils equal round and reactive to light and accomodation.  No conjunctivitis or scleral icterus. ENT:  Oropharynx clear without lesion.  Tongue normal. Mucous membranes moist.  RESPIRATORY:  Clear to  auscultation without rales, wheezes or rhonchi. CARDIOVASCULAR:  Regular rate and rhythm without murmur, rub or gallop. BREAST: Right mastectomy. Breast lesions dry.  Nodularity measuring 4 x 4.5 cm medially.  Medial lesion has a halo of erythema extending 1.5-2 cm circumferentially.  Irregular border streaking extends 7  cm superiorly and 4 cm inferiorly.  Small nodular areas above incision.  3 fingertip nodules in far lateral axillae. BACK: Kyphosis. ABDOMEN:  Soft, non-tender, with active bowel sounds, and no hepatosplenomegaly.  No masses. SKIN:  Chest wall lesions as noted above. EXTREMITIES: No edema, no skin discoloration or tenderness.  No palpable cords. LYMPH NODES: Fullness right axillae (stable).  No cervical, supraclavicular or inguinal adenopathy. PSYCH: Appropriate.   Office Visit on 02/09/2015  Component Date Value Ref Range  Status  . WBC 02/09/2015 3.0* 3.6 - 11.0 K/uL Final  . RBC 02/09/2015 3.85  3.80 - 5.20 MIL/uL Final  . Hemoglobin 02/09/2015 10.4* 12.0 - 16.0 g/dL Final  . HCT 02/09/2015 32.3* 35.0 - 47.0 % Final  . MCV 02/09/2015 84.0  80.0 - 100.0 fL Final  . MCH 02/09/2015 26.9  26.0 - 34.0 pg Final  . MCHC 02/09/2015 32.1  32.0 - 36.0 g/dL Final  . RDW 02/09/2015 24.3* 11.5 - 14.5 % Final  . Platelets 02/09/2015 255  150 - 440 K/uL Final  . Neutrophils Relative % 02/09/2015 58   Final  . Neutro Abs 02/09/2015 1.7  1.4 - 6.5 K/uL Final  . Lymphocytes Relative 02/09/2015 26   Final  . Lymphs Abs 02/09/2015 0.8* 1.0 - 3.6 K/uL Final  . Monocytes Relative 02/09/2015 15   Final  . Monocytes Absolute 02/09/2015 0.5  0.2 - 0.9 K/uL Final  . Eosinophils Relative 02/09/2015 0   Final  . Eosinophils Absolute 02/09/2015 0.0  0 - 0.7 K/uL Final  . Basophils Relative 02/09/2015 1   Final  . Basophils Absolute 02/09/2015 0.0  0 - 0.1 K/uL Final  . Sodium 02/09/2015 135  135 - 145 mmol/L Final  . Potassium 02/09/2015 3.8  3.5 - 5.1 mmol/L Final  . Chloride 02/09/2015  104  101 - 111 mmol/L Final  . CO2 02/09/2015 25  22 - 32 mmol/L Final  . Glucose, Bld 02/09/2015 166* 65 - 99 mg/dL Final  . BUN 02/09/2015 16  6 - 20 mg/dL Final  . Creatinine, Ser 02/09/2015 0.48  0.44 - 1.00 mg/dL Final  . Calcium 02/09/2015 8.8* 8.9 - 10.3 mg/dL Final  . Total Protein 02/09/2015 6.9  6.5 - 8.1 g/dL Final  . Albumin 02/09/2015 4.2  3.5 - 5.0 g/dL Final  . AST 02/09/2015 20  15 - 41 U/L Final  . ALT 02/09/2015 15  14 - 54 U/L Final  . Alkaline Phosphatase 02/09/2015 50  38 - 126 U/L Final  . Total Bilirubin 02/09/2015 0.6  0.3 - 1.2 mg/dL Final  . GFR calc non Af Amer 02/09/2015 >60  >60 mL/min Final  . GFR calc Af Amer 02/09/2015 >60  >60 mL/min Final   Comment: (NOTE) The eGFR has been calculated using the CKD EPI equation. This calculation has not been validated in all clinical situations. eGFR's persistently <60 mL/min signify possible Chronic Kidney Disease.   . Anion gap 02/09/2015 6  5 - 15 Final    Assessment:  LACEE GREY is an 80 y.o. female with stage IIIC right breast cancer status post mastectomy and axillary lymph node dissection on 08/31/2014. 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 Carcinoma involved skeletal muscle and dermal lymphatics. Tumor was less than 0.5 mm from the deep margin. Thirteen of 15 lymph nodes were involved. The largest metastatic focus was 2 cm. Tumor is triple negative (ER negative, PR negative, and HER-2/neu negative). Pathologic stage was T3N3aMx.  PET scan on 09/21/2014 revealed postoperative changes in the right chest with no suspicious findings or residual tumor. Bone scan on 10/11/2014 revealed no evidence of metastatic disease.  Bone density study on 10/02/2014 revealed a T score of -4.7 in the forearm consistent with osteoporosis. T-score was less than 2.5 in the spine or hip. Echocardiogram on 10/02/2014 revealed ejection fraction of 60-65%.  She has  iron deficiency anemia. Labs on 09/19/2014 revealed a hematocrit of 29.9, hemoglobin 8.7, MCV 69.7,  ferritin 9, and TIBC 616. B12 was low (265) with a prior history of B12 deficiency and need for supplementation (stopped 06/2014). She began B12 on 10/12/2014 (last 01/18/2015).  Folate was normal. Her diet is modest. She has never had a colonoscopy. She denies any melena or hematochezia.  She continues to tolerate 1 iron pill a day.  She is eating red meat.  Hematocrit is stable to slightly improved.  Chest wall biopsy x 3 (lateral, medial, and middle sites) on 11/08/2014 revealed dermal lymphatic involvement by invasive mammary carcinoma with micropapillary features.    She has aggressive disease with growth despite several chemotherapeutic agents.  She received 5 weeks of Taxol (11/03/2014 - 12/01/2014).  She received 1 week of adriamycin (12/14/2014).  She received 1 cycle of  carboplatin (12/28/2014).  After carboplatin, disease appeared to stabilize, but with 2 weeks off of therapy, her disease grew again (medial lesion larger and weaping).   She is currently day 1 of cycle #2 carboplatin and Taxol (09/22/206- 02/09/2015).  Chest wall lesions are stable.   Plan: 1.  Labs today:  CBC with diff, CMP. 2.  Medical photographs 3.  Day 1 of cycle #2 carboplatin and Taxol today. 4.  RTC in 1 week for MD assess, labs (CBC with diff, CMP) and day 8 Taxol +/- OnPro Neulasta   [name], MD  02/09/2015 , 10:48 AM

## 2015-02-09 NOTE — Progress Notes (Signed)
Pt reports no changes since last visit, in general reports skin area at right breast looking better

## 2015-02-15 ENCOUNTER — Other Ambulatory Visit: Payer: PPO

## 2015-02-15 ENCOUNTER — Inpatient Hospital Stay: Payer: PPO

## 2015-02-15 ENCOUNTER — Inpatient Hospital Stay (HOSPITAL_BASED_OUTPATIENT_CLINIC_OR_DEPARTMENT_OTHER): Payer: PPO | Admitting: Hematology and Oncology

## 2015-02-15 VITALS — BP 147/77 | HR 85 | Temp 98.2°F | Wt 110.7 lb

## 2015-02-15 DIAGNOSIS — I10 Essential (primary) hypertension: Secondary | ICD-10-CM

## 2015-02-15 DIAGNOSIS — I2510 Atherosclerotic heart disease of native coronary artery without angina pectoris: Secondary | ICD-10-CM

## 2015-02-15 DIAGNOSIS — E119 Type 2 diabetes mellitus without complications: Secondary | ICD-10-CM

## 2015-02-15 DIAGNOSIS — E785 Hyperlipidemia, unspecified: Secondary | ICD-10-CM

## 2015-02-15 DIAGNOSIS — Z9011 Acquired absence of right breast and nipple: Secondary | ICD-10-CM

## 2015-02-15 DIAGNOSIS — Z7982 Long term (current) use of aspirin: Secondary | ICD-10-CM

## 2015-02-15 DIAGNOSIS — R000 Tachycardia, unspecified: Secondary | ICD-10-CM

## 2015-02-15 DIAGNOSIS — Z79899 Other long term (current) drug therapy: Secondary | ICD-10-CM

## 2015-02-15 DIAGNOSIS — M129 Arthropathy, unspecified: Secondary | ICD-10-CM

## 2015-02-15 DIAGNOSIS — Z87442 Personal history of urinary calculi: Secondary | ICD-10-CM

## 2015-02-15 DIAGNOSIS — C778 Secondary and unspecified malignant neoplasm of lymph nodes of multiple regions: Secondary | ICD-10-CM

## 2015-02-15 DIAGNOSIS — C50911 Malignant neoplasm of unspecified site of right female breast: Secondary | ICD-10-CM | POA: Diagnosis not present

## 2015-02-15 DIAGNOSIS — D509 Iron deficiency anemia, unspecified: Secondary | ICD-10-CM

## 2015-02-15 DIAGNOSIS — E538 Deficiency of other specified B group vitamins: Secondary | ICD-10-CM

## 2015-02-15 DIAGNOSIS — R21 Rash and other nonspecific skin eruption: Secondary | ICD-10-CM

## 2015-02-15 DIAGNOSIS — Z171 Estrogen receptor negative status [ER-]: Secondary | ICD-10-CM | POA: Diagnosis not present

## 2015-02-15 DIAGNOSIS — M40209 Unspecified kyphosis, site unspecified: Secondary | ICD-10-CM

## 2015-02-15 LAB — COMPREHENSIVE METABOLIC PANEL
ALT: 11 U/L — ABNORMAL LOW (ref 14–54)
AST: 20 U/L (ref 15–41)
Albumin: 4.2 g/dL (ref 3.5–5.0)
Alkaline Phosphatase: 48 U/L (ref 38–126)
Anion gap: 6 (ref 5–15)
BUN: 18 mg/dL (ref 6–20)
CO2: 25 mmol/L (ref 22–32)
Calcium: 8.9 mg/dL (ref 8.9–10.3)
Chloride: 105 mmol/L (ref 101–111)
Creatinine, Ser: 0.53 mg/dL (ref 0.44–1.00)
GFR calc Af Amer: >60 mL/min (ref >60)
GFR calc non Af Amer: >60 mL/min (ref >60)
Glucose, Bld: 153 mg/dL — ABNORMAL HIGH (ref 65–99)
Potassium: 3.8 mmol/L (ref 3.5–5.1)
Sodium: 136 mmol/L (ref 135–145)
Total Bilirubin: 0.6 mg/dL (ref 0.3–1.2)
Total Protein: 6.9 g/dL (ref 6.5–8.1)

## 2015-02-15 LAB — CBC WITH DIFFERENTIAL/PLATELET
Basophils Absolute: 0 10*3/uL (ref 0–0.1)
Basophils Relative: 1 %
Eosinophils Absolute: 0 10*3/uL (ref 0–0.7)
Eosinophils Relative: 1 %
HCT: 30.4 % — ABNORMAL LOW (ref 35.0–47.0)
Hemoglobin: 10 g/dL — ABNORMAL LOW (ref 12.0–16.0)
Lymphocytes Relative: 35 %
Lymphs Abs: 1.1 10*3/uL (ref 1.0–3.6)
MCH: 27.9 pg (ref 26.0–34.0)
MCHC: 32.8 g/dL (ref 32.0–36.0)
MCV: 85.1 fL (ref 80.0–100.0)
Monocytes Absolute: 0.2 10*3/uL (ref 0.2–0.9)
Monocytes Relative: 6 %
Neutro Abs: 1.8 10*3/uL (ref 1.4–6.5)
Neutrophils Relative %: 57 %
Platelets: 216 10*3/uL (ref 150–440)
RBC: 3.57 MIL/uL — ABNORMAL LOW (ref 3.80–5.20)
RDW: 23.4 % — ABNORMAL HIGH (ref 11.5–14.5)
WBC: 3.2 10*3/uL — ABNORMAL LOW (ref 3.6–11.0)

## 2015-02-15 MED ORDER — SODIUM CHLORIDE 0.9 % IJ SOLN
10.0000 mL | Freq: Once | INTRAMUSCULAR | Status: AC
  Administered 2015-02-15: 10 mL via INTRAVENOUS
  Filled 2015-02-15: qty 10

## 2015-02-15 MED ORDER — HEPARIN SOD (PORK) LOCK FLUSH 100 UNIT/ML IV SOLN
500.0000 [IU] | Freq: Once | INTRAVENOUS | Status: AC
  Administered 2015-02-15: 500 [IU] via INTRAVENOUS
  Filled 2015-02-15: qty 5

## 2015-02-15 MED ORDER — SODIUM CHLORIDE 0.9 % IV SOLN
Freq: Once | INTRAVENOUS | Status: AC
  Administered 2015-02-15: 15:00:00 via INTRAVENOUS
  Filled 2015-02-15: qty 8

## 2015-02-15 MED ORDER — DIPHENHYDRAMINE HCL 50 MG/ML IJ SOLN
50.0000 mg | Freq: Once | INTRAMUSCULAR | Status: AC
  Administered 2015-02-15: 50 mg via INTRAVENOUS
  Filled 2015-02-15: qty 1

## 2015-02-15 MED ORDER — FAMOTIDINE IN NACL 20-0.9 MG/50ML-% IV SOLN
20.0000 mg | Freq: Two times a day (BID) | INTRAVENOUS | Status: DC
  Administered 2015-02-15: 20 mg via INTRAVENOUS
  Filled 2015-02-15: qty 50

## 2015-02-15 MED ORDER — SODIUM CHLORIDE 0.9 % IV SOLN
INTRAVENOUS | Status: DC
  Administered 2015-02-15: 15:00:00 via INTRAVENOUS
  Filled 2015-02-15: qty 1000

## 2015-02-15 MED ORDER — PACLITAXEL CHEMO INJECTION 300 MG/50ML
80.0000 mg/m2 | Freq: Once | INTRAVENOUS | Status: AC
  Administered 2015-02-15: 120 mg via INTRAVENOUS
  Filled 2015-02-15: qty 20

## 2015-02-19 ENCOUNTER — Other Ambulatory Visit: Payer: Self-pay

## 2015-02-19 DIAGNOSIS — C50911 Malignant neoplasm of unspecified site of right female breast: Secondary | ICD-10-CM

## 2015-02-22 ENCOUNTER — Inpatient Hospital Stay: Payer: PPO

## 2015-02-22 ENCOUNTER — Inpatient Hospital Stay (HOSPITAL_BASED_OUTPATIENT_CLINIC_OR_DEPARTMENT_OTHER): Payer: PPO | Admitting: Hematology and Oncology

## 2015-02-22 ENCOUNTER — Other Ambulatory Visit: Payer: Self-pay | Admitting: Internal Medicine

## 2015-02-22 VITALS — BP 145/75 | HR 81 | Temp 95.6°F | Resp 18 | Ht 60.0 in | Wt 110.0 lb

## 2015-02-22 DIAGNOSIS — E538 Deficiency of other specified B group vitamins: Secondary | ICD-10-CM | POA: Diagnosis not present

## 2015-02-22 DIAGNOSIS — I2510 Atherosclerotic heart disease of native coronary artery without angina pectoris: Secondary | ICD-10-CM

## 2015-02-22 DIAGNOSIS — Z79899 Other long term (current) drug therapy: Secondary | ICD-10-CM

## 2015-02-22 DIAGNOSIS — Z87442 Personal history of urinary calculi: Secondary | ICD-10-CM

## 2015-02-22 DIAGNOSIS — M40209 Unspecified kyphosis, site unspecified: Secondary | ICD-10-CM

## 2015-02-22 DIAGNOSIS — C50911 Malignant neoplasm of unspecified site of right female breast: Secondary | ICD-10-CM | POA: Diagnosis not present

## 2015-02-22 DIAGNOSIS — E785 Hyperlipidemia, unspecified: Secondary | ICD-10-CM

## 2015-02-22 DIAGNOSIS — Z9011 Acquired absence of right breast and nipple: Secondary | ICD-10-CM

## 2015-02-22 DIAGNOSIS — E119 Type 2 diabetes mellitus without complications: Secondary | ICD-10-CM

## 2015-02-22 DIAGNOSIS — D509 Iron deficiency anemia, unspecified: Secondary | ICD-10-CM

## 2015-02-22 DIAGNOSIS — I10 Essential (primary) hypertension: Secondary | ICD-10-CM

## 2015-02-22 DIAGNOSIS — R000 Tachycardia, unspecified: Secondary | ICD-10-CM

## 2015-02-22 DIAGNOSIS — R21 Rash and other nonspecific skin eruption: Secondary | ICD-10-CM

## 2015-02-22 DIAGNOSIS — M129 Arthropathy, unspecified: Secondary | ICD-10-CM

## 2015-02-22 DIAGNOSIS — C778 Secondary and unspecified malignant neoplasm of lymph nodes of multiple regions: Secondary | ICD-10-CM

## 2015-02-22 DIAGNOSIS — Z7982 Long term (current) use of aspirin: Secondary | ICD-10-CM

## 2015-02-22 LAB — COMPREHENSIVE METABOLIC PANEL
ALT: 11 U/L — ABNORMAL LOW (ref 14–54)
AST: 18 U/L (ref 15–41)
Albumin: 4.3 g/dL (ref 3.5–5.0)
Alkaline Phosphatase: 46 U/L (ref 38–126)
Anion gap: 7 (ref 5–15)
BUN: 13 mg/dL (ref 6–20)
CO2: 25 mmol/L (ref 22–32)
Calcium: 8.8 mg/dL — ABNORMAL LOW (ref 8.9–10.3)
Chloride: 104 mmol/L (ref 101–111)
Creatinine, Ser: 0.57 mg/dL (ref 0.44–1.00)
GFR calc Af Amer: >60 mL/min (ref >60)
GFR calc non Af Amer: >60 mL/min (ref >60)
Glucose, Bld: 199 mg/dL — ABNORMAL HIGH (ref 65–99)
Potassium: 3.6 mmol/L (ref 3.5–5.1)
Sodium: 136 mmol/L (ref 135–145)
Total Bilirubin: 0.6 mg/dL (ref 0.3–1.2)
Total Protein: 7.2 g/dL (ref 6.5–8.1)

## 2015-02-22 LAB — CBC WITH DIFFERENTIAL/PLATELET
Basophils Absolute: 0 10*3/uL (ref 0–0.1)
Basophils Relative: 1 %
Eosinophils Absolute: 0 10*3/uL (ref 0–0.7)
Eosinophils Relative: 1 %
HCT: 32.5 % — ABNORMAL LOW (ref 35.0–47.0)
Hemoglobin: 10.6 g/dL — ABNORMAL LOW (ref 12.0–16.0)
Lymphocytes Relative: 33 %
Lymphs Abs: 0.9 10*3/uL — ABNORMAL LOW (ref 1.0–3.6)
MCH: 28.4 pg (ref 26.0–34.0)
MCHC: 32.6 g/dL (ref 32.0–36.0)
MCV: 87.1 fL (ref 80.0–100.0)
Monocytes Absolute: 0.3 10*3/uL (ref 0.2–0.9)
Monocytes Relative: 11 %
Neutro Abs: 1.4 10*3/uL (ref 1.4–6.5)
Neutrophils Relative %: 54 %
Platelets: 292 10*3/uL (ref 150–440)
RBC: 3.74 MIL/uL — ABNORMAL LOW (ref 3.80–5.20)
RDW: 23.5 % — ABNORMAL HIGH (ref 11.5–14.5)
WBC: 2.6 10*3/uL — ABNORMAL LOW (ref 3.6–11.0)

## 2015-02-22 LAB — MAGNESIUM: Magnesium: 1.8 mg/dL (ref 1.7–2.4)

## 2015-02-22 NOTE — Progress Notes (Signed)
[facility] day:  02/22/2015   Chief Complaint: Dawn Wiley is an 80 y.o. female with stage IIIC right breast cancer who is seen for assessment on day 15 of cycle #2 carboplatin and Taxol.  HPI: The patient was last seen in the medical oncology clinic on 02/15/2015.  At that time, she was seen prior to day 8 of cycle #2 carboplatin and Taxol.  Chest wall lesions were stable.  Symptomatically, she was doing well.  Counts were good (ANC1800).  During the interim, she has felt "about like always".  She states that it is hard to say if the lesions are better or not.  Past Medical History  Diagnosis Date  . Kidney stones   . Hyperlipidemia   . Coronary artery disease     Coronary calcifications noted on CT scan  . Diabetes mellitus without complication ([place])   . Nephrolithiasis   . Hypertension   . Arthritis   . Anemia   . Tachycardia     Dr. Fletcher Anon, cardiologist  . Cancer [facility])     breast (right)  . PONV (postoperative nausea and vomiting)   . Rash     back  . Breast cancer [facility])     Past Surgical History  Procedure Laterality Date  . Temporomandibular joint surgery    . Cataract extraction      bilateral  . Breast surgery      breast biopsy X 3  . Eye surgery      bilateral cataract extraction  . Mastectomy modified radical Right 08/31/2014    Procedure: MASTECTOMY MODIFIED RADICAL;  Surgeon: Molly Maduro, MD;  Location: [facility] ORS;  Service: General;  Laterality: Right;  . Portacath placement Left 10/20/2014    Procedure: INSERTION PORT-A-CATH;  Surgeon: Marlyce Huge, MD;  Location: [facility] ORS;  Service: General;  Laterality: Left;    Family History  Problem Relation Age of Onset  . Peripheral Artery Disease Sister     carotid artery stenosis   . Heart disease Sister   . Diabetes Sister     Social History:  reports that she has never smoked. She has never used smokeless tobacco. She reports that she does not  drink alcohol or use illicit drugs.  The patient is alone today.  Allergies: No Known Allergies  Current Medications: Current Outpatient Prescriptions  Medication Sig Dispense Refill  . acetaminophen (TYLENOL) 500 MG tablet Take 500 mg by mouth every 6 (six) hours as needed for mild pain or moderate pain.     [name] Kitchen aspirin 81 MG tablet Take 81 mg by mouth every other day.    . Calcium Carbonate (CALTRATE 600 PO) Take 1,200 mg by mouth daily.     . cholecalciferol (VITAMIN D) 400 UNITS TABS tablet Take 800 Units by mouth.    . Cyanocobalamin (VITAMIN B-12 IJ) Inject as directed every 30 (thirty) days.     . diphenhydrAMINE (BENADRYL) 25 mg capsule Take 25 mg by mouth every 6 (six) hours as needed for allergies.    . ferrous sulfate 325 (65 FE) MG tablet Take 325 mg by mouth daily with breakfast.     . lidocaine-prilocaine (EMLA) cream Apply 1 application topically as needed. Apply 1-2 hours prior to chemotherapy 30 g 1  . LORazepam (ATIVAN) 0.5 MG tablet Take 1 tablet (0.5 mg total) by mouth every 6 (six) hours as needed (Take 0.24m by mouth every 6 hours as needed for Nausea and vomiting). 3[place]oolittle  tablet 0  . metFORMIN (GLUCOPHAGE) 500 MG tablet Take 1 tablet (500 mg total) by mouth 2 (two) times daily with a meal. 180 tablet 1  . metoprolol tartrate (LOPRESSOR) 25 MG tablet Take 1 tablet (25 mg total) by mouth 2 (two) times daily. 180 tablet 1  . ondansetron (ZOFRAN) 8 MG tablet Take 1 tablet (8 mg total) by mouth every 8 (eight) hours as needed for nausea or vomiting. 20 tablet 0  . oxyCODONE-acetaminophen (PERCOCET/ROXICET) 5-325 MG per tablet Take 1 tablet by mouth every 4 (four) hours as needed for severe pain. 20 tablet 0  . amLODipine (NORVASC) 5 MG tablet take 1 tablet by mouth once daily 90 tablet 1   No current facility-administered medications for this visit.   Facility-Administered Medications Ordered in Other Visits  Medication Dose Route Frequency Provider Last Rate Last Dose  .  CARBOplatin (PARAPLATIN) 310 mg in sodium chloride 0.9 % 100 mL chemo infusion  310 mg Intravenous Once [name], MD   310 mg at 01/18/15 1708  . famotidine (PEPCID) IVPB 20 mg premix  20 mg Intravenous Q12H [name], MD   20 mg at 01/18/15 1511  . sodium chloride 0.9 % injection 10 mL  10 mL Intravenous PRN [name], MD        Review of Systems:  GENERAL: Feels "about the same". No fevers or sweats. Weight stable. PERFORMANCE STATUS (ECOG): 1 HEENT:No visual changes, runny nose, sore throat, mouth sores or tenderness. Lungs: No shortness of breath or cough. No hemoptysis. Cardiac: No chest pain, palpitations, orthopnea, or PND. GI: No nausea, vomiting, diarrhea, constipation, melena or hematochezia. GU: No urgency, frequency, dysuria, or hematuria. Musculoskeletal: No back pain. No joint pain. No muscle tenderness. Extremities: No pain or swelling. Skin: Chest wall lesions "about the same".  No new spots.  Streaking on chest wall similar. Neuro: No headache, numbness or weakness, balance or coordination issues. Endocrine: No diabetes, thyroid issues, hot flashes or night sweats. Psych: No mood changes, depression or anxiety. Pain: No focal pain. Review of systems: All other systems reviewed and found to be negative.  Physical Exam: Blood pressure 145/75, pulse 81, temperature 95.6 F (35.3 C), temperature source Tympanic, resp. rate 18, height 5' (1.524 m), weight 110 lb 0.2 oz (49.9 kg). GENERAL: Thin elderly woman sitting comfortably in a private room in the infusion area in no acute distress. MENTAL STATUS: Alert and oriented to person, place and time. HEAD: Short silver wig. Normocephalic, atraumatic, face symmetric, no Cushingoid features. EYES: Blue eyes. Pupils equal round and reactive to light and accomodation.  No conjunctivitis or scleral icterus. ENT:  Oropharynx clear without lesion.  Tongue normal. Mucous membranes  moist.  RESPIRATORY:  Clear to auscultation without rales, wheezes or rhonchi. CARDIOVASCULAR:  Regular rate and rhythm without murmur, rub or gallop. BREAST: Right mastectomy. Breast lesions dry.  Nodularity measuring 4.5 x 4.5 cm medially and 4 x 3.5 cm laterally (less distinct).  Medial lesion has a halo of erythema extending 1 cm inferiorly, 1.5 cm superiorly and 2 cm medially.  Irregular border streaking extends 7.5 cm superiorly and 4 cm inferiorly.  Small nodular areas above incision.  Palpable area laterally along incision 2.5 cm x 2.5 cm and 3 x 2 cm.  3 fingertip nodules in far lateral axillae. ABDOMEN:  Soft, non-tender, with active bowel sounds, and no hepatosplenomegaly.  No masses. SKIN: Chest wall lesions as above. EXTREMITIES: No edema, no skin discoloration or tenderness. No  palpable cords. LYMPH NODES: Fullness right axillae (stable).   No palpable cervical, supraclavicular, or inguinal adenopathy  NEUROLOGICAL: Unremarkable. PSYCH: Appropriate.   Appointment on 02/22/2015  Component Date Value Ref Range Status  . WBC 02/22/2015 2.6* 3.6 - 11.0 K/uL Final  . RBC 02/22/2015 3.74* 3.80 - 5.20 MIL/uL Final  . Hemoglobin 02/22/2015 10.6* 12.0 - 16.0 g/dL Final  . HCT 02/22/2015 32.5* 35.0 - 47.0 % Final  . MCV 02/22/2015 87.1  80.0 - 100.0 fL Final  . MCH 02/22/2015 28.4  26.0 - 34.0 pg Final  . MCHC 02/22/2015 32.6  32.0 - 36.0 g/dL Final  . RDW 02/22/2015 23.5* 11.5 - 14.5 % Final  . Platelets 02/22/2015 292  150 - 440 K/uL Final  . Neutrophils Relative % 02/22/2015 54   Final  . Neutro Abs 02/22/2015 1.4  1.4 - 6.5 K/uL Final  . Lymphocytes Relative 02/22/2015 33   Final  . Lymphs Abs 02/22/2015 0.9* 1.0 - 3.6 K/uL Final  . Monocytes Relative 02/22/2015 11   Final  . Monocytes Absolute 02/22/2015 0.3  0.2 - 0.9 K/uL Final  . Eosinophils Relative 02/22/2015 1   Final  . Eosinophils Absolute 02/22/2015 0.0  0 - 0.7 K/uL Final  . Basophils Relative 02/22/2015 1    Final  . Basophils Absolute 02/22/2015 0.0  0 - 0.1 K/uL Final  . Sodium 02/22/2015 136  135 - 145 mmol/L Final  . Potassium 02/22/2015 3.6  3.5 - 5.1 mmol/L Final  . Chloride 02/22/2015 104  101 - 111 mmol/L Final  . CO2 02/22/2015 25  22 - 32 mmol/L Final  . Glucose, Bld 02/22/2015 199* 65 - 99 mg/dL Final  . BUN 02/22/2015 13  6 - 20 mg/dL Final  . Creatinine, Ser 02/22/2015 0.57  0.44 - 1.00 mg/dL Final  . Calcium 02/22/2015 8.8* 8.9 - 10.3 mg/dL Final  . Total Protein 02/22/2015 7.2  6.5 - 8.1 g/dL Final  . Albumin 02/22/2015 4.3  3.5 - 5.0 g/dL Final  . AST 02/22/2015 18  15 - 41 U/L Final  . ALT 02/22/2015 11* 14 - 54 U/L Final  . Alkaline Phosphatase 02/22/2015 46  38 - 126 U/L Final  . Total Bilirubin 02/22/2015 0.6  0.3 - 1.2 mg/dL Final  . GFR calc non Af Amer 02/22/2015 >60  >60 mL/min Final  . GFR calc Af Amer 02/22/2015 >60  >60 mL/min Final   Comment: (NOTE) The eGFR has been calculated using the CKD EPI equation. This calculation has not been validated in all clinical situations. eGFR's persistently <60 mL/min signify possible Chronic Kidney Disease.   . Anion gap 02/22/2015 7  5 - 15 Final  . Magnesium 02/22/2015 1.8  1.7 - 2.4 mg/dL Final    Assessment:  Dawn Wiley is an 80 y.o. female with stage IIIC right breast cancer status post mastectomy and axillary lymph node dissection on 08/31/2014. 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 Carcinoma involved skeletal muscle and dermal lymphatics. Tumor was less than 0.5 mm from the deep margin. Thirteen of 15 lymph nodes were involved. The largest metastatic focus was 2 cm. Tumor is triple negative (ER negative, PR negative, and HER-2/neu negative). Pathologic stage was T3N3aMx.  PET scan on 09/21/2014 revealed postoperative changes in the right chest with no suspicious findings or residual tumor. Bone scan on 10/11/2014 revealed no evidence of metastatic  disease.  Bone density study on 10/02/2014 revealed a T score of -4.7  in the forearm consistent with osteoporosis. T-score was less than 2.5 in the spine or hip. Echocardiogram on 10/02/2014 revealed ejection fraction of 60-65%.  She has iron deficiency anemia. Labs on 09/19/2014 revealed a hematocrit of 29.9, hemoglobin 8.7, MCV 69.7, ferritin 9, and TIBC 616. B12 was low (265) with a prior history of B12 deficiency and need for supplementation (stopped 06/2014). She began B12 on 10/12/2014 (last 01/18/2015).  Folate was normal. Her diet is modest. She has never had a colonoscopy. She denies any melena or hematochezia.  She continues to tolerate 1 iron pill a day.  She is eating red meat.  Hematocrit is stable to slightly improved.  Chest wall biopsy x 3 (lateral, medial, and middle sites) on 11/08/2014 revealed dermal lymphatic involvement by invasive mammary carcinoma with micropapillary features.    She has aggressive disease with growth despite several chemotherapeutic agents.  She received 5 weeks of Taxol (11/03/2014 - 12/01/2014).  She received 1 week of adriamycin (12/14/2014).  She received 1 cycle of  carboplatin (12/28/2014).  After carboplatin, disease appeared to stabilize, but with 2 weeks off of therapy, her disease grew again (medial lesion larger and weaping).   She is currently day 22 of cycle #1 carboplatin and Taxol (09/22/206 - 02/09/2015).  Chest wall lesions are questionably more prominent.   Plan: 1.  Labs today:  CBC with diff, CMP.  2.  Medical photographs 3.  Review prior imaging to assess response. 4.  RTC in 1 week for labs (CBC with diff, CMP, Mg) and cycle #3 of carboplatin/ Taxol or cycle #1 AC if disease progression documented.  Addendum:  Medical photographs were reviewed from prior to initiation of carboplatin and Taxol. Lesions have clearly decreased in size  [name], MD  02/22/2015 , 11:24 AM

## 2015-02-22 NOTE — Progress Notes (Signed)
Patient is here for follow-up of breast cancer. Patient states that she is doing good.

## 2015-02-23 ENCOUNTER — Telehealth: Payer: Self-pay

## 2015-02-23 NOTE — Telephone Encounter (Signed)
Called pt per MD to let pt know that after reviewing images, it appears that her treatment has improved her skin on the right breast area.  Pt verbalized an understanding and relief.  I also informed pt she would remain on the same treatment and she verbalized an understanding.

## 2015-03-01 ENCOUNTER — Inpatient Hospital Stay: Payer: PPO | Attending: Hematology and Oncology | Admitting: Hematology and Oncology

## 2015-03-01 ENCOUNTER — Inpatient Hospital Stay: Payer: PPO

## 2015-03-01 ENCOUNTER — Other Ambulatory Visit: Payer: PPO

## 2015-03-01 VITALS — BP 156/77 | HR 82 | Temp 97.3°F | Resp 18 | Ht 60.0 in | Wt 111.8 lb

## 2015-03-01 DIAGNOSIS — R21 Rash and other nonspecific skin eruption: Secondary | ICD-10-CM | POA: Diagnosis not present

## 2015-03-01 DIAGNOSIS — Z7689 Persons encountering health services in other specified circumstances: Secondary | ICD-10-CM | POA: Diagnosis not present

## 2015-03-01 DIAGNOSIS — Z5111 Encounter for antineoplastic chemotherapy: Secondary | ICD-10-CM | POA: Diagnosis present

## 2015-03-01 DIAGNOSIS — Z171 Estrogen receptor negative status [ER-]: Secondary | ICD-10-CM | POA: Diagnosis not present

## 2015-03-01 DIAGNOSIS — Z9011 Acquired absence of right breast and nipple: Secondary | ICD-10-CM | POA: Insufficient documentation

## 2015-03-01 DIAGNOSIS — E119 Type 2 diabetes mellitus without complications: Secondary | ICD-10-CM | POA: Insufficient documentation

## 2015-03-01 DIAGNOSIS — C779 Secondary and unspecified malignant neoplasm of lymph node, unspecified: Secondary | ICD-10-CM

## 2015-03-01 DIAGNOSIS — I10 Essential (primary) hypertension: Secondary | ICD-10-CM | POA: Insufficient documentation

## 2015-03-01 DIAGNOSIS — R0989 Other specified symptoms and signs involving the circulatory and respiratory systems: Secondary | ICD-10-CM | POA: Diagnosis not present

## 2015-03-01 DIAGNOSIS — Z7984 Long term (current) use of oral hypoglycemic drugs: Secondary | ICD-10-CM | POA: Insufficient documentation

## 2015-03-01 DIAGNOSIS — C50911 Malignant neoplasm of unspecified site of right female breast: Secondary | ICD-10-CM | POA: Diagnosis not present

## 2015-03-01 DIAGNOSIS — E876 Hypokalemia: Secondary | ICD-10-CM | POA: Insufficient documentation

## 2015-03-01 DIAGNOSIS — D509 Iron deficiency anemia, unspecified: Secondary | ICD-10-CM | POA: Diagnosis not present

## 2015-03-01 DIAGNOSIS — R439 Unspecified disturbances of smell and taste: Secondary | ICD-10-CM | POA: Insufficient documentation

## 2015-03-01 DIAGNOSIS — G893 Neoplasm related pain (acute) (chronic): Secondary | ICD-10-CM | POA: Insufficient documentation

## 2015-03-01 DIAGNOSIS — Z87442 Personal history of urinary calculi: Secondary | ICD-10-CM | POA: Insufficient documentation

## 2015-03-01 DIAGNOSIS — E538 Deficiency of other specified B group vitamins: Secondary | ICD-10-CM | POA: Diagnosis not present

## 2015-03-01 DIAGNOSIS — Z79899 Other long term (current) drug therapy: Secondary | ICD-10-CM | POA: Diagnosis not present

## 2015-03-01 DIAGNOSIS — Z7982 Long term (current) use of aspirin: Secondary | ICD-10-CM | POA: Insufficient documentation

## 2015-03-01 DIAGNOSIS — E785 Hyperlipidemia, unspecified: Secondary | ICD-10-CM | POA: Diagnosis not present

## 2015-03-01 DIAGNOSIS — R5383 Other fatigue: Secondary | ICD-10-CM | POA: Insufficient documentation

## 2015-03-01 DIAGNOSIS — M129 Arthropathy, unspecified: Secondary | ICD-10-CM | POA: Diagnosis not present

## 2015-03-01 DIAGNOSIS — R000 Tachycardia, unspecified: Secondary | ICD-10-CM

## 2015-03-01 DIAGNOSIS — I2510 Atherosclerotic heart disease of native coronary artery without angina pectoris: Secondary | ICD-10-CM | POA: Insufficient documentation

## 2015-03-01 LAB — CBC WITH DIFFERENTIAL/PLATELET
Basophils Absolute: 0 10*3/uL (ref 0–0.1)
Basophils Relative: 1 %
Eosinophils Absolute: 0 10*3/uL (ref 0–0.7)
Eosinophils Relative: 0 %
HCT: 34 % — ABNORMAL LOW (ref 35.0–47.0)
Hemoglobin: 10.9 g/dL — ABNORMAL LOW (ref 12.0–16.0)
Lymphocytes Relative: 27 %
Lymphs Abs: 1.1 10*3/uL (ref 1.0–3.6)
MCH: 28 pg (ref 26.0–34.0)
MCHC: 32.1 g/dL (ref 32.0–36.0)
MCV: 87.5 fL (ref 80.0–100.0)
Monocytes Absolute: 0.6 10*3/uL (ref 0.2–0.9)
Monocytes Relative: 16 %
Neutro Abs: 2.2 10*3/uL (ref 1.4–6.5)
Neutrophils Relative %: 56 %
Platelets: 295 10*3/uL (ref 150–440)
RBC: 3.89 MIL/uL (ref 3.80–5.20)
RDW: 22.6 % — ABNORMAL HIGH (ref 11.5–14.5)
WBC: 4 10*3/uL (ref 3.6–11.0)

## 2015-03-01 LAB — COMPREHENSIVE METABOLIC PANEL
ALT: 15 U/L (ref 14–54)
AST: 20 U/L (ref 15–41)
Albumin: 4.5 g/dL (ref 3.5–5.0)
Alkaline Phosphatase: 51 U/L (ref 38–126)
Anion gap: 6 (ref 5–15)
BUN: 12 mg/dL (ref 6–20)
CO2: 27 mmol/L (ref 22–32)
Calcium: 9.2 mg/dL (ref 8.9–10.3)
Chloride: 105 mmol/L (ref 101–111)
Creatinine, Ser: 0.54 mg/dL (ref 0.44–1.00)
GFR calc Af Amer: >60 mL/min (ref >60)
GFR calc non Af Amer: >60 mL/min (ref >60)
Glucose, Bld: 141 mg/dL — ABNORMAL HIGH (ref 65–99)
Potassium: 3.9 mmol/L (ref 3.5–5.1)
Sodium: 138 mmol/L (ref 135–145)
Total Bilirubin: 0.5 mg/dL (ref 0.3–1.2)
Total Protein: 7.4 g/dL (ref 6.5–8.1)

## 2015-03-02 ENCOUNTER — Inpatient Hospital Stay: Payer: PPO

## 2015-03-02 ENCOUNTER — Encounter: Payer: Self-pay | Admitting: Oncology

## 2015-03-02 ENCOUNTER — Other Ambulatory Visit: Payer: Self-pay | Admitting: Family Medicine

## 2015-03-02 VITALS — BP 134/71 | HR 75 | Temp 98.0°F | Resp 20

## 2015-03-02 DIAGNOSIS — C50911 Malignant neoplasm of unspecified site of right female breast: Secondary | ICD-10-CM

## 2015-03-02 DIAGNOSIS — E538 Deficiency of other specified B group vitamins: Secondary | ICD-10-CM

## 2015-03-02 DIAGNOSIS — Z5111 Encounter for antineoplastic chemotherapy: Secondary | ICD-10-CM | POA: Diagnosis not present

## 2015-03-02 MED ORDER — CYANOCOBALAMIN 1000 MCG/ML IJ SOLN
1000.0000 ug | Freq: Once | INTRAMUSCULAR | Status: AC
  Administered 2015-03-02: 1000 ug via INTRAMUSCULAR
  Filled 2015-03-02: qty 1

## 2015-03-02 MED ORDER — FAMOTIDINE IN NACL 20-0.9 MG/50ML-% IV SOLN
20.0000 mg | Freq: Two times a day (BID) | INTRAVENOUS | Status: DC
  Administered 2015-03-02: 20 mg via INTRAVENOUS
  Filled 2015-03-02: qty 50

## 2015-03-02 MED ORDER — SODIUM CHLORIDE 0.9 % IV SOLN
Freq: Once | INTRAVENOUS | Status: AC
  Administered 2015-03-02: 10:00:00 via INTRAVENOUS
  Filled 2015-03-02: qty 1000

## 2015-03-02 MED ORDER — DIPHENHYDRAMINE HCL 50 MG/ML IJ SOLN
50.0000 mg | Freq: Once | INTRAMUSCULAR | Status: AC
  Administered 2015-03-02: 50 mg via INTRAVENOUS
  Filled 2015-03-02: qty 1

## 2015-03-02 MED ORDER — SODIUM CHLORIDE 0.9 % IJ SOLN
10.0000 mL | INTRAMUSCULAR | Status: DC | PRN
  Administered 2015-03-02: 10 mL
  Filled 2015-03-02: qty 10

## 2015-03-02 MED ORDER — PACLITAXEL CHEMO INJECTION 300 MG/50ML
80.0000 mg/m2 | Freq: Once | INTRAVENOUS | Status: AC
  Administered 2015-03-02: 120 mg via INTRAVENOUS
  Filled 2015-03-02: qty 20

## 2015-03-02 MED ORDER — SODIUM CHLORIDE 0.9 % IV SOLN
313.5000 mg | Freq: Once | INTRAVENOUS | Status: AC
  Administered 2015-03-02: 310 mg via INTRAVENOUS
  Filled 2015-03-02: qty 31

## 2015-03-02 MED ORDER — HEPARIN SOD (PORK) LOCK FLUSH 100 UNIT/ML IV SOLN
500.0000 [IU] | Freq: Once | INTRAVENOUS | Status: AC | PRN
  Administered 2015-03-02: 500 [IU]
  Filled 2015-03-02: qty 5

## 2015-03-02 MED ORDER — SODIUM CHLORIDE 0.9 % IV SOLN
Freq: Once | INTRAVENOUS | Status: AC
  Administered 2015-03-02: 11:00:00 via INTRAVENOUS
  Filled 2015-03-02: qty 8

## 2015-03-08 ENCOUNTER — Other Ambulatory Visit: Payer: Self-pay | Admitting: Hematology and Oncology

## 2015-03-08 ENCOUNTER — Inpatient Hospital Stay: Payer: PPO

## 2015-03-08 ENCOUNTER — Other Ambulatory Visit: Payer: Self-pay

## 2015-03-08 ENCOUNTER — Inpatient Hospital Stay (HOSPITAL_BASED_OUTPATIENT_CLINIC_OR_DEPARTMENT_OTHER): Payer: PPO | Admitting: Hematology and Oncology

## 2015-03-08 ENCOUNTER — Other Ambulatory Visit: Payer: PPO

## 2015-03-08 VITALS — BP 146/79 | HR 89 | Temp 98.2°F | Ht 60.0 in | Wt 109.8 lb

## 2015-03-08 DIAGNOSIS — Z87442 Personal history of urinary calculi: Secondary | ICD-10-CM

## 2015-03-08 DIAGNOSIS — Z171 Estrogen receptor negative status [ER-]: Secondary | ICD-10-CM

## 2015-03-08 DIAGNOSIS — C50911 Malignant neoplasm of unspecified site of right female breast: Secondary | ICD-10-CM

## 2015-03-08 DIAGNOSIS — Z9011 Acquired absence of right breast and nipple: Secondary | ICD-10-CM

## 2015-03-08 DIAGNOSIS — Z7982 Long term (current) use of aspirin: Secondary | ICD-10-CM

## 2015-03-08 DIAGNOSIS — R21 Rash and other nonspecific skin eruption: Secondary | ICD-10-CM

## 2015-03-08 DIAGNOSIS — R000 Tachycardia, unspecified: Secondary | ICD-10-CM

## 2015-03-08 DIAGNOSIS — E538 Deficiency of other specified B group vitamins: Secondary | ICD-10-CM

## 2015-03-08 DIAGNOSIS — Z79899 Other long term (current) drug therapy: Secondary | ICD-10-CM

## 2015-03-08 DIAGNOSIS — Z5111 Encounter for antineoplastic chemotherapy: Secondary | ICD-10-CM | POA: Diagnosis not present

## 2015-03-08 DIAGNOSIS — C779 Secondary and unspecified malignant neoplasm of lymph node, unspecified: Secondary | ICD-10-CM

## 2015-03-08 DIAGNOSIS — R5383 Other fatigue: Secondary | ICD-10-CM

## 2015-03-08 DIAGNOSIS — I2510 Atherosclerotic heart disease of native coronary artery without angina pectoris: Secondary | ICD-10-CM

## 2015-03-08 DIAGNOSIS — D509 Iron deficiency anemia, unspecified: Secondary | ICD-10-CM

## 2015-03-08 DIAGNOSIS — I10 Essential (primary) hypertension: Secondary | ICD-10-CM

## 2015-03-08 DIAGNOSIS — E119 Type 2 diabetes mellitus without complications: Secondary | ICD-10-CM

## 2015-03-08 DIAGNOSIS — M129 Arthropathy, unspecified: Secondary | ICD-10-CM

## 2015-03-08 DIAGNOSIS — Z7984 Long term (current) use of oral hypoglycemic drugs: Secondary | ICD-10-CM

## 2015-03-08 DIAGNOSIS — E785 Hyperlipidemia, unspecified: Secondary | ICD-10-CM

## 2015-03-08 LAB — CBC WITH DIFFERENTIAL/PLATELET
Basophils Absolute: 0.1 10*3/uL (ref 0–0.1)
Basophils Relative: 2 %
Eosinophils Absolute: 0 10*3/uL (ref 0–0.7)
Eosinophils Relative: 1 %
HCT: 32.3 % — ABNORMAL LOW (ref 35.0–47.0)
Hemoglobin: 10.4 g/dL — ABNORMAL LOW (ref 12.0–16.0)
Lymphocytes Relative: 32 %
Lymphs Abs: 0.9 10*3/uL — ABNORMAL LOW (ref 1.0–3.6)
MCH: 28.4 pg (ref 26.0–34.0)
MCHC: 32.1 g/dL (ref 32.0–36.0)
MCV: 88.6 fL (ref 80.0–100.0)
Monocytes Absolute: 0.2 10*3/uL (ref 0.2–0.9)
Monocytes Relative: 6 %
Neutro Abs: 1.8 10*3/uL (ref 1.4–6.5)
Neutrophils Relative %: 59 %
Platelets: 207 10*3/uL (ref 150–440)
RBC: 3.65 MIL/uL — ABNORMAL LOW (ref 3.80–5.20)
RDW: 22.6 % — ABNORMAL HIGH (ref 11.5–14.5)
WBC: 3 10*3/uL — ABNORMAL LOW (ref 3.6–11.0)

## 2015-03-08 LAB — COMPREHENSIVE METABOLIC PANEL
ALT: 12 U/L — ABNORMAL LOW (ref 14–54)
AST: 25 U/L (ref 15–41)
Albumin: 4.4 g/dL (ref 3.5–5.0)
Alkaline Phosphatase: 48 U/L (ref 38–126)
Anion gap: 10 (ref 5–15)
BUN: 14 mg/dL (ref 6–20)
CO2: 23 mmol/L (ref 22–32)
Calcium: 9.3 mg/dL (ref 8.9–10.3)
Chloride: 104 mmol/L (ref 101–111)
Creatinine, Ser: 0.64 mg/dL (ref 0.44–1.00)
GFR calc Af Amer: >60 mL/min (ref >60)
GFR calc non Af Amer: >60 mL/min (ref >60)
Glucose, Bld: 203 mg/dL — ABNORMAL HIGH (ref 65–99)
Potassium: 3.8 mmol/L (ref 3.5–5.1)
Sodium: 137 mmol/L (ref 135–145)
Total Bilirubin: 0.6 mg/dL (ref 0.3–1.2)
Total Protein: 7 g/dL (ref 6.5–8.1)

## 2015-03-08 MED ORDER — DIPHENHYDRAMINE HCL 50 MG/ML IJ SOLN
50.0000 mg | Freq: Once | INTRAMUSCULAR | Status: AC
  Administered 2015-03-08: 50 mg via INTRAVENOUS
  Filled 2015-03-08: qty 1

## 2015-03-08 MED ORDER — SODIUM CHLORIDE 0.9 % IV SOLN
INTRAVENOUS | Status: DC
  Administered 2015-03-08: 11:00:00 via INTRAVENOUS
  Filled 2015-03-08: qty 1000

## 2015-03-08 MED ORDER — SODIUM CHLORIDE 0.9 % IJ SOLN
10.0000 mL | Freq: Once | INTRAMUSCULAR | Status: AC
  Administered 2015-03-08: 10 mL via INTRAVENOUS
  Filled 2015-03-08: qty 10

## 2015-03-08 MED ORDER — SODIUM CHLORIDE 0.9 % IV SOLN
Freq: Once | INTRAVENOUS | Status: AC
  Administered 2015-03-08: 12:00:00 via INTRAVENOUS
  Filled 2015-03-08: qty 8

## 2015-03-08 MED ORDER — FAMOTIDINE IN NACL 20-0.9 MG/50ML-% IV SOLN
20.0000 mg | Freq: Two times a day (BID) | INTRAVENOUS | Status: DC
  Administered 2015-03-08: 20 mg via INTRAVENOUS
  Filled 2015-03-08: qty 50

## 2015-03-08 MED ORDER — HEPARIN SOD (PORK) LOCK FLUSH 100 UNIT/ML IV SOLN
500.0000 [IU] | Freq: Once | INTRAVENOUS | Status: AC
  Administered 2015-03-08: 500 [IU] via INTRAVENOUS
  Filled 2015-03-08: qty 5

## 2015-03-08 MED ORDER — PACLITAXEL CHEMO INJECTION 300 MG/50ML
80.0000 mg/m2 | Freq: Once | INTRAVENOUS | Status: AC
  Administered 2015-03-08: 120 mg via INTRAVENOUS
  Filled 2015-03-08: qty 20

## 2015-03-08 NOTE — Progress Notes (Signed)
[facility] day:  03/08/2015   Chief Complaint: Dawn Wiley is an 80 y.o. female with stage IIIC right breast cancer who is seen for assessment on day 8 of cycle #3 carboplatin and Taxol.  HPI: The patient was last seen in the medical oncology clinic on 03/01/2015.  At that time, she was day 1 of cycle #3 carboplatin and Taxol.  Chest wall lesions by prior review of imaging had decreased in size.  At last visit, lesions appeared slightly more confluent.   During the interim, she noted fatigue.  She denies any nausea or vomiting.  She denies any drainage from her chest wall lesions.  She is unsure if the lesions are growing.  Past Medical History  Diagnosis Date  . Kidney stones   . Hyperlipidemia   . Coronary artery disease     Coronary calcifications noted on CT scan  . Diabetes mellitus without complication ([place])   . Nephrolithiasis   . Hypertension   . Arthritis   . Anemia   . Tachycardia     Dr. Fletcher Anon, cardiologist  . Cancer [facility])     breast (right)  . PONV (postoperative nausea and vomiting)   . Rash     back  . Breast cancer [facility])     Past Surgical History  Procedure Laterality Date  . Temporomandibular joint surgery    . Cataract extraction      bilateral  . Breast surgery      breast biopsy X 3  . Eye surgery      bilateral cataract extraction  . Mastectomy modified radical Right 08/31/2014    Procedure: MASTECTOMY MODIFIED RADICAL;  Surgeon: Molly Maduro, MD;  Location: [facility] ORS;  Service: General;  Laterality: Right;  . Portacath placement Left 10/20/2014    Procedure: INSERTION PORT-A-CATH;  Surgeon: Marlyce Huge, MD;  Location: [facility] ORS;  Service: General;  Laterality: Left;    Family History  Problem Relation Age of Onset  . Peripheral Artery Disease Sister     carotid artery stenosis   . Heart disease Sister   . Diabetes Sister     Social History:  reports that she has never smoked. She  has never used smokeless tobacco. She reports that she does not drink alcohol or use illicit drugs.  The patient is alone today.  Allergies: No Known Allergies  Current Medications: Current Outpatient Prescriptions  Medication Sig Dispense Refill  . acetaminophen (TYLENOL) 500 MG tablet Take 500 mg by mouth every 6 (six) hours as needed for mild pain or moderate pain.     [name] Kitchen amLODipine (NORVASC) 5 MG tablet take 1 tablet by mouth once daily 90 tablet 1  . aspirin 81 MG tablet Take 81 mg by mouth every other day.    . Calcium Carbonate (CALTRATE 600 PO) Take 1,200 mg by mouth daily.     . cholecalciferol (VITAMIN D) 400 UNITS TABS tablet Take 800 Units by mouth.    . Cyanocobalamin (VITAMIN B-12 IJ) Inject as directed every 30 (thirty) days.     . diphenhydrAMINE (BENADRYL) 25 mg capsule Take 25 mg by mouth every 6 (six) hours as needed for allergies.    . ferrous sulfate 325 (65 FE) MG tablet Take 325 mg by mouth daily with breakfast.     . lidocaine-prilocaine (EMLA) cream Apply 1 application topically as needed. Apply 1-2 hours prior to chemotherapy 30 g 1  . LORazepam (ATIVAN) 0.5 MG tablet Take  1 tablet (0.5 mg total) by mouth every 6 (six) hours as needed (Take 0.54m by mouth every 6 hours as needed for Nausea and vomiting). 30 tablet 0  . metFORMIN (GLUCOPHAGE) 500 MG tablet Take 1 tablet (500 mg total) by mouth 2 (two) times daily with a meal. 180 tablet 1  . metoprolol tartrate (LOPRESSOR) 25 MG tablet Take 1 tablet (25 mg total) by mouth 2 (two) times daily. 180 tablet 1  . ondansetron (ZOFRAN) 8 MG tablet Take 1 tablet (8 mg total) by mouth every 8 (eight) hours as needed for nausea or vomiting. 20 tablet 0  . oxyCODONE-acetaminophen (PERCOCET/ROXICET) 5-325 MG per tablet Take 1 tablet by mouth every 4 (four) hours as needed for severe pain. 20 tablet 0   No current facility-administered medications for this visit.   Facility-Administered Medications Ordered in Other Visits   Medication Dose Route Frequency Provider Last Rate Last Dose  . CARBOplatin (PARAPLATIN) 310 mg in sodium chloride 0.9 % 100 mL chemo infusion  310 mg Intravenous Once MLequita Asal MD   310 mg at 01/18/15 1708  . famotidine (PEPCID) IVPB 20 mg premix  20 mg Intravenous Q12H MLequita Asal MD   20 mg at 01/18/15 1511  . heparin lock flush 100 unit/mL  500 Units Intravenous Once MLequita Asal MD      . sodium chloride 0.9 % injection 10 mL  10 mL Intravenous PRN MLequita Asal MD      . sodium chloride 0.9 % injection 10 mL  10 mL Intravenous Once MLequita Asal MD        Review of Systems:  GENERAL: Feels tired. No fevers or sweats. Weight stable. PERFORMANCE STATUS (ECOG): 1 HEENT:No visual changes, runny nose, sore throat, mouth sores or tenderness. Lungs: No shortness of breath or cough. No hemoptysis. Cardiac: No chest pain, palpitations, orthopnea, or PND. GI: No nausea, vomiting, diarrhea, constipation, melena or hematochezia. GU: No urgency, frequency, dysuria, or hematuria. Musculoskeletal: No back pain. No joint pain. No muscle tenderness. Extremities: No pain or swelling. Skin: Chest wall lesions (unclear if growing). Neuro: No headache, numbness or weakness, balance or coordination issues. Endocrine: No diabetes, thyroid issues, hot flashes or night sweats. Psych: No mood changes, depression or anxiety. Pain: No focal pain. Review of systems: All other systems reviewed and found to be negative.  Physical Exam: Blood pressure 146/79, pulse 89, temperature 98.2 F (36.8 C), temperature source Oral, height 5' (1.524 m), weight 109 lb 12.6 oz (49.8 kg). GENERAL: Thin elderly woman sitting comfortably in a private room in the infusion area in no acute distress. MENTAL STATUS: Alert and oriented to person, place and time. HEAD: Short silver wig. Normocephalic, atraumatic, face symmetric, no Cushingoid features. EYES: Blue eyes.  Pupils equal round and reactive to light and accomodation. No conjunctivitis or scleral icterus. ENT: Oropharynx clear without lesion. Tongue normal. Mucous membranes moist.  RESPIRATORY: Clear to auscultation without rales, wheezes or rhonchi. CARDIOVASCULAR: Regular rate and rhythm without murmur, rub or gallop. BREAST: Right mastectomy. Breast lesions dry.  Nodularity measuring 7 x 4.5 cm medially (more beneath skin surface) and 3 x 3 cm laterally.  Medial lesion has a halo of erythema extending 1-2 cm circumferentially.  Irregular border streaking extends 8.5 cm superiorly and 4.5 cm inferiorly.  Palpable area laterally along incision 3 cm x 3 cm, 1.5 cm and 2 cm. 3 slightly more prominent nodules in the lateral axillae (> fingertip; extension). ABDOMEN: Soft, non-tender, with  active bowel sounds, and no hepatosplenomegaly. No masses. SKIN: Chest wall lesions as above. EXTREMITIES: No edema, no skin discoloration or tenderness. No palpable cords. LYMPH NODES: No palpable cervical, supraclavicular, axillary or inguinal adenopathy  NEUROLOGICAL: Unremarkable. PSYCH: Appropriate.   Infusion on 03/08/2015  Component Date Value Ref Range Status  . WBC 03/08/2015 3.0* 3.6 - 11.0 K/uL Final  . RBC 03/08/2015 3.65* 3.80 - 5.20 MIL/uL Final  . Hemoglobin 03/08/2015 10.4* 12.0 - 16.0 g/dL Final  . HCT 03/08/2015 32.3* 35.0 - 47.0 % Final  . MCV 03/08/2015 88.6  80.0 - 100.0 fL Final  . MCH 03/08/2015 28.4  26.0 - 34.0 pg Final  . MCHC 03/08/2015 32.1  32.0 - 36.0 g/dL Final  . RDW 03/08/2015 22.6* 11.5 - 14.5 % Final  . Platelets 03/08/2015 207  150 - 440 K/uL Final  . Neutrophils Relative % 03/08/2015 59   Final  . Neutro Abs 03/08/2015 1.8  1.4 - 6.5 K/uL Final  . Lymphocytes Relative 03/08/2015 32   Final  . Lymphs Abs 03/08/2015 0.9* 1.0 - 3.6 K/uL Final  . Monocytes Relative 03/08/2015 6   Final  . Monocytes Absolute 03/08/2015 0.2  0.2 - 0.9 K/uL Final  . Eosinophils  Relative 03/08/2015 1   Final  . Eosinophils Absolute 03/08/2015 0.0  0 - 0.7 K/uL Final  . Basophils Relative 03/08/2015 2   Final  . Basophils Absolute 03/08/2015 0.1  0 - 0.1 K/uL Final  . Sodium 03/08/2015 137  135 - 145 mmol/L Final  . Potassium 03/08/2015 3.8  3.5 - 5.1 mmol/L Final  . Chloride 03/08/2015 104  101 - 111 mmol/L Final  . CO2 03/08/2015 23  22 - 32 mmol/L Final  . Glucose, Bld 03/08/2015 203* 65 - 99 mg/dL Final  . BUN 03/08/2015 14  6 - 20 mg/dL Final  . Creatinine, Ser 03/08/2015 0.64  0.44 - 1.00 mg/dL Final  . Calcium 03/08/2015 9.3  8.9 - 10.3 mg/dL Final  . Total Protein 03/08/2015 7.0  6.5 - 8.1 g/dL Final  . Albumin 03/08/2015 4.4  3.5 - 5.0 g/dL Final  . AST 03/08/2015 25  15 - 41 U/L Final  . ALT 03/08/2015 12* 14 - 54 U/L Final  . Alkaline Phosphatase 03/08/2015 48  38 - 126 U/L Final  . Total Bilirubin 03/08/2015 0.6  0.3 - 1.2 mg/dL Final  . GFR calc non Af Amer 03/08/2015 >60  >60 mL/min Final  . GFR calc Af Amer 03/08/2015 >60  >60 mL/min Final   Comment: (NOTE) The eGFR has been calculated using the CKD EPI equation. This calculation has not been validated in all clinical situations. eGFR's persistently <60 mL/min signify possible Chronic Kidney Disease.   . Anion gap 03/08/2015 10  5 - 15 Final    Assessment:  Dawn Wiley is an 80 y.o. female with stage IIIC right breast cancer status post mastectomy and axillary lymph node dissection on 08/31/2014. 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 Carcinoma involved skeletal muscle and dermal lymphatics. Tumor was less than 0.5 mm from the deep margin. Thirteen of 15 lymph nodes were involved. The largest metastatic focus was 2 cm. Tumor is triple negative (ER negative, PR negative, and HER-2/neu negative). Pathologic stage was T3N3aMx.  PET scan on 09/21/2014 revealed postoperative changes in the right chest with no suspicious findings  or residual tumor. Bone scan on 10/11/2014 revealed no evidence of metastatic disease.  Bone density study  on 10/02/2014 revealed a T score of -4.7 in the forearm consistent with osteoporosis. T-score was less than 2.5 in the spine or hip. Echocardiogram on 10/02/2014 revealed ejection fraction of 60-65%.  She has iron deficiency anemia. Labs on 09/19/2014 revealed a hematocrit of 29.9, hemoglobin 8.7, MCV 69.7, ferritin 9, and TIBC 616. B12 was low (265) with a prior history of B12 deficiency and need for supplementation (stopped 06/2014). She began B12 on 10/12/2014 (last 01/18/2015).  Folate was normal. Her diet is modest. She has never had a colonoscopy. She denies any melena or hematochezia.  She continues to tolerate 1 iron pill a day.  She is eating red meat.  Hematocrit is stable to slightly improved.  Chest wall biopsy x 3 (lateral, medial, and middle sites) on 11/08/2014 revealed dermal lymphatic involvement by invasive mammary carcinoma with micropapillary features.    She has aggressive disease with growth despite several chemotherapeutic agents.  She received 5 weeks of Taxol (11/03/2014 - 12/01/2014).  She received 1 week of adriamycin (12/14/2014).  She received 1 cycle of  carboplatin (12/28/2014).  After carboplatin, disease appeared to stabilize, but with 2 weeks off of therapy, her disease grew again (medial lesion larger and weaping).   She is currently day 8 of cycle #3 carboplatin and Taxol (01/18/2015 - 03/02/2015).  Chest wall lesions appear to be extending beneath the surface and becoming more confluent.   Plan: 1.  Labs today:  CBC with diff, CMP. 2.  Medical photographs 3.  Day 8 Taxol today. 4.  Compare medical photos to assess response. 5.  RTC in 1 week for labs (CBC with diff). 6.  RTC on 11/23 in [place] for MD assessment, labs (CBC with diff, CMP, Mg), and cycle #4 carboplatin/taxol or cycle #1 AC.  Addendum:  Medical photographs compared after clinic.   Lesions have grown as well as extent of disease.  Patient notified.  Discuss consideration of AC with Neulasta support.  Side effects reviewed.  [name], MD  03/08/2015 , 10:31 AM

## 2015-03-09 ENCOUNTER — Other Ambulatory Visit: Payer: Self-pay

## 2015-03-13 ENCOUNTER — Encounter: Payer: Self-pay | Admitting: Hematology and Oncology

## 2015-03-13 ENCOUNTER — Other Ambulatory Visit: Payer: Self-pay | Admitting: Hematology and Oncology

## 2015-03-13 DIAGNOSIS — C50911 Malignant neoplasm of unspecified site of right female breast: Secondary | ICD-10-CM

## 2015-03-13 NOTE — Progress Notes (Signed)
[facility] day:  03/01/2015  Chief Complaint: Dawn Wiley is an 80 y.o. female with stage IIIC right breast cancer who is seen for assessment on day 1 of cycle #3 carboplatin and Taxol.  HPI: The patient was last seen in the medical oncology clinic on 02/22/2015.  At that time, she was seen on day 15 of cycle #2 carboplatin and Taxol.  Chest wall lesions appeared slightly more prominent.  Review of medical images suggested a response to therapy.  Decision was made to proceed this week with cycle #3 chemotherapy.  Counts were good ([id number]).  During the interim, she has notes "no sickness". It's that the whole area is not painful but only a little uncomfortable. She states that Tylenol helps.  Past Medical History  Diagnosis Date  . Kidney stones   . Hyperlipidemia   . Coronary artery disease     Coronary calcifications noted on CT scan  . Diabetes mellitus without complication ([place])   . Nephrolithiasis   . Hypertension   . Arthritis   . Anemia   . Tachycardia     Dr. Fletcher Anon, cardiologist  . Cancer [facility])     breast (right)  . PONV (postoperative nausea and vomiting)   . Rash     back  . Breast cancer [facility])     Past Surgical History  Procedure Laterality Date  . Temporomandibular joint surgery    . Cataract extraction      bilateral  . Breast surgery      breast biopsy X 3  . Eye surgery      bilateral cataract extraction  . Mastectomy modified radical Right 08/31/2014    Procedure: MASTECTOMY MODIFIED RADICAL;  Surgeon: Molly Maduro, MD;  Location: [facility] ORS;  Service: General;  Laterality: Right;  . Portacath placement Left 10/20/2014    Procedure: INSERTION PORT-A-CATH;  Surgeon: Marlyce Huge, MD;  Location: [facility] ORS;  Service: General;  Laterality: Left;    Family History  Problem Relation Age of Onset  . Peripheral Artery Disease Sister     carotid artery stenosis   . Heart disease Sister   . Diabetes  Sister     Social History:  reports that she has never smoked. She has never used smokeless tobacco. She reports that she does not drink alcohol or use illicit drugs.  The patient is alone today.  Allergies: No Known Allergies  Current Medications: Current Outpatient Prescriptions  Medication Sig Dispense Refill  . acetaminophen (TYLENOL) 500 MG tablet Take 500 mg by mouth every 6 (six) hours as needed for mild pain or moderate pain.     [name] Kitchen amLODipine (NORVASC) 5 MG tablet take 1 tablet by mouth once daily 90 tablet 1  . aspirin 81 MG tablet Take 81 mg by mouth every other day.    . Calcium Carbonate (CALTRATE 600 PO) Take 1,200 mg by mouth daily.     . cholecalciferol (VITAMIN D) 400 UNITS TABS tablet Take 800 Units by mouth.    . Cyanocobalamin (VITAMIN B-12 IJ) Inject as directed every 30 (thirty) days.     . diphenhydrAMINE (BENADRYL) 25 mg capsule Take 25 mg by mouth every 6 (six) hours as needed for allergies.    . ferrous sulfate 325 (65 FE) MG tablet Take 325 mg by mouth daily with breakfast.     . lidocaine-prilocaine (EMLA) cream Apply 1 application topically as needed. Apply 1-2 hours prior to chemotherapy 30 g 1  .  LORazepam (ATIVAN) 0.5 MG tablet Take 1 tablet (0.5 mg total) by mouth every 6 (six) hours as needed (Take 0.75m by mouth every 6 hours as needed for Nausea and vomiting). 30 tablet 0  . metFORMIN (GLUCOPHAGE) 500 MG tablet Take 1 tablet (500 mg total) by mouth 2 (two) times daily with a meal. 180 tablet 1  . metoprolol tartrate (LOPRESSOR) 25 MG tablet Take 1 tablet (25 mg total) by mouth 2 (two) times daily. 180 tablet 1  . ondansetron (ZOFRAN) 8 MG tablet Take 1 tablet (8 mg total) by mouth every 8 (eight) hours as needed for nausea or vomiting. 20 tablet 0  . oxyCODONE-acetaminophen (PERCOCET/ROXICET) 5-325 MG per tablet Take 1 tablet by mouth every 4 (four) hours as needed for severe pain. 20 tablet 0   No current facility-administered medications for this visit.    Facility-Administered Medications Ordered in Other Visits  Medication Dose Route Frequency Provider Last Rate Last Dose  . CARBOplatin (PARAPLATIN) 310 mg in sodium chloride 0.9 % 100 mL chemo infusion  310 mg Intravenous Once MLequita Asal MD   310 mg at 01/18/15 1708  . famotidine (PEPCID) IVPB 20 mg premix  20 mg Intravenous Q12H MLequita Asal MD   20 mg at 01/18/15 1511  . sodium chloride 0.9 % injection 10 mL  10 mL Intravenous PRN MLequita Asal MD        Review of Systems:  GENERAL: Feels good. No fevers or sweats. Weight stable. PERFORMANCE STATUS (ECOG): 1 HEENT:No visual changes, runny nose, sore throat, mouth sores or tenderness. Lungs: No shortness of breath or cough. No hemoptysis. Cardiac: No chest pain, palpitations, orthopnea, or PND. GI: No nausea, vomiting, diarrhea, constipation, melena or hematochezia. GU: No urgency, frequency, dysuria, or hematuria. Musculoskeletal: No back pain. No joint pain. No muscle tenderness. Extremities: No pain or swelling. Skin: Chest wall lesions slightly uncomfotable.  No new spots.  Streaking on chest wall similar. Neuro: No headache, numbness or weakness, balance or coordination issues. Endocrine: No diabetes, thyroid issues, hot flashes or night sweats. Psych: No mood changes, depression or anxiety. Pain: No focal pain. Review of systems: All other systems reviewed and found to be negative.  Physical Exam: Blood pressure 156/77, pulse 82, temperature 97.3 F (36.3 C), temperature source Tympanic, resp. rate 18, height 5' (1.524 m), weight 111 lb 12.4 oz (50.7 kg). GENERAL: Thin elderly woman sitting comfortably in a private room in the infusion area in no acute distress. MENTAL STATUS: Alert and oriented to person, place and time. HEAD: Short silver wig. Normocephalic, atraumatic, face symmetric, no Cushingoid features. EYES: Blue eyes. Pupils equal round and reactive to light and  accomodation.  No conjunctivitis or scleral icterus. ENT:  Oropharynx clear without lesion.  Tongue normal. Mucous membranes moist.  RESPIRATORY:  Clear to auscultation without rales, wheezes or rhonchi. CARDIOVASCULAR:  Regular rate and rhythm without murmur, rub or gallop. BREAST: Right mastectomy. Breast lesions dry.  Prominent nodularity measuring 4.5 x 4.5 cm medially and 2.5 x 1.5 cm laterally.  Medial lesion has a halo of erythema extending 2 cm inferiorly, 1 cm superiorly and 2.5 cm medially.  Irregular border streaking extends 8 cm superiorly and 4 cm inferiorly. Palpable area laterally along incision 2.5 cm x 1.5 cm, 1.5 x 1.5 cm and 2.5 x 2.5 cm.  3 nodules in far lateral axillae with suggestion of more confluence. ABDOMEN:  Soft, non-tender, with active bowel sounds, and no hepatosplenomegaly.  No masses. SKIN: Chest  wall lesions as above. EXTREMITIES: No edema, no skin discoloration or tenderness. No palpable cords. LYMPH NODES: Fullness right axillae (stable).   No palpable cervical, supraclavicular, or inguinal adenopathy  NEUROLOGICAL: Unremarkable. PSYCH: Appropriate.   Appointment on 03/01/2015  Component Date Value Ref Range Status  . WBC 03/01/2015 4.0  3.6 - 11.0 K/uL Final  . RBC 03/01/2015 3.89  3.80 - 5.20 MIL/uL Final  . Hemoglobin 03/01/2015 10.9* 12.0 - 16.0 g/dL Final  . HCT 03/01/2015 34.0* 35.0 - 47.0 % Final  . MCV 03/01/2015 87.5  80.0 - 100.0 fL Final  . MCH 03/01/2015 28.0  26.0 - 34.0 pg Final  . MCHC 03/01/2015 32.1  32.0 - 36.0 g/dL Final  . RDW 03/01/2015 22.6* 11.5 - 14.5 % Final  . Platelets 03/01/2015 295  150 - 440 K/uL Final  . Neutrophils Relative % 03/01/2015 56   Final  . Neutro Abs 03/01/2015 2.2  1.4 - 6.5 K/uL Final  . Lymphocytes Relative 03/01/2015 27   Final  . Lymphs Abs 03/01/2015 1.1  1.0 - 3.6 K/uL Final  . Monocytes Relative 03/01/2015 16   Final  . Monocytes Absolute 03/01/2015 0.6  0.2 - 0.9 K/uL Final  . Eosinophils  Relative 03/01/2015 0   Final  . Eosinophils Absolute 03/01/2015 0.0  0 - 0.7 K/uL Final  . Basophils Relative 03/01/2015 1   Final  . Basophils Absolute 03/01/2015 0.0  0 - 0.1 K/uL Final  . Sodium 03/01/2015 138  135 - 145 mmol/L Final  . Potassium 03/01/2015 3.9  3.5 - 5.1 mmol/L Final  . Chloride 03/01/2015 105  101 - 111 mmol/L Final  . CO2 03/01/2015 27  22 - 32 mmol/L Final  . Glucose, Bld 03/01/2015 141* 65 - 99 mg/dL Final  . BUN 03/01/2015 12  6 - 20 mg/dL Final  . Creatinine, Ser 03/01/2015 0.54  0.44 - 1.00 mg/dL Final  . Calcium 03/01/2015 9.2  8.9 - 10.3 mg/dL Final  . Total Protein 03/01/2015 7.4  6.5 - 8.1 g/dL Final  . Albumin 03/01/2015 4.5  3.5 - 5.0 g/dL Final  . AST 03/01/2015 20  15 - 41 U/L Final  . ALT 03/01/2015 15  14 - 54 U/L Final  . Alkaline Phosphatase 03/01/2015 51  38 - 126 U/L Final  . Total Bilirubin 03/01/2015 0.5  0.3 - 1.2 mg/dL Final  . GFR calc non Af Amer 03/01/2015 >60  >60 mL/min Final  . GFR calc Af Amer 03/01/2015 >60  >60 mL/min Final   Comment: (NOTE) The eGFR has been calculated using the CKD EPI equation. This calculation has not been validated in all clinical situations. eGFR's persistently <60 mL/min signify possible Chronic Kidney Disease.   . Anion gap 03/01/2015 6  5 - 15 Final    Assessment:  Dawn Wiley is an 80 y.o. female with stage IIIC right breast cancer status post mastectomy and axillary lymph node dissection on 08/31/2014. 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 Carcinoma involved skeletal muscle and dermal lymphatics. Tumor was less than 0.5 mm from the deep margin. Thirteen of 15 lymph nodes were involved. The largest metastatic focus was 2 cm. Tumor is triple negative (ER negative, PR negative, and HER-2/neu negative). Pathologic stage was T3N3aMx.  PET scan on 09/21/2014 revealed postoperative changes in the right chest with no suspicious findings or  residual tumor. Bone scan on 10/11/2014 revealed no evidence of metastatic disease.  Bone density study on  10/02/2014 revealed a T score of -4.7 in the forearm consistent with osteoporosis. T-score was less than 2.5 in the spine or hip. Echocardiogram on 10/02/2014 revealed ejection fraction of 60-65%.  She has iron deficiency anemia. Labs on 09/19/2014 revealed a hematocrit of 29.9, hemoglobin 8.7, MCV 69.7, ferritin 9, and TIBC 616. B12 was low (265) with a prior history of B12 deficiency and need for supplementation (stopped 06/2014). She began B12 on 10/12/2014 (last 01/18/2015).  Folate was normal. Her diet is modest. She has never had a colonoscopy. She denies any melena or hematochezia.  She continues to tolerate 1 iron pill a day.  She is eating red meat.  Hematocrit is stable to slightly improved.  Chest wall biopsy x 3 (lateral, medial, and middle sites) on 11/08/2014 revealed dermal lymphatic involvement by invasive mammary carcinoma with micropapillary features.    She has aggressive disease with growth despite several chemotherapeutic agents.  She received 5 weeks of Taxol (11/03/2014 - 12/01/2014).  She received 1 week of adriamycin (12/14/2014).  She received 1 cycle of  carboplatin (12/28/2014).  After carboplatin, disease appeared to stabilize, but with 2 weeks off of therapy, her disease grew again (medial lesion larger and weaping).   She is currently day 1 of cycle #3 carboplatin and Taxol (09/22/206 - 03/02/2015).  Chest wall lesions are questionably more prominent.   Plan: 1.  Labs today:  CBC with diff, CMP.  2.  Medical photographs 3.  Review prior imaging to assess response. 4.  RTC tomorrow for day 1 of cycle #3 carboplatin and Taxol + B12 5.  RTC in 1 week for labs (CBC with diff, CMP, Mg) and cycle #3 of carboplatin/ Taxol or cycle #1 AC if disease progression documented.  Addendum:  Medical photographs were reviewed from prior to initiation of carboplatin and  Taxol. Lesions have clearly decreased in size  [name], MD  03/01/2015

## 2015-03-13 NOTE — Progress Notes (Signed)
[facility] day:  02/15/2015  Chief Complaint: Dawn Wiley is an 80 y.o. female with stage IIIC right breast cancer who is seen for assessment on day 8 of cycle #2 carboplatin and Taxol.  HPI: The patient was last seen in the medical oncology clinic on 02/09/2015.  At that time, she was day 1 of cycle #2 carboplatin and Taxol.  Chest wall lesions were stable.  She noted a little bit of itching superiorly on her right chest wall. She had no drainage in the past week.  She received her chemotherapy uneventfully.  During the interim, she has continued to do well. She denies any side effects associated with chemotherapy. She notes no drainage from the cutaneous lesions on her chest wall.  Past Medical History  Diagnosis Date  . Kidney stones   . Hyperlipidemia   . Coronary artery disease     Coronary calcifications noted on CT scan  . Diabetes mellitus without complication ([place])   . Nephrolithiasis   . Hypertension   . Arthritis   . Anemia   . Tachycardia     Dr. Fletcher Anon, cardiologist  . Cancer [facility])     breast (right)  . PONV (postoperative nausea and vomiting)   . Rash     back  . Breast cancer [facility])     Past Surgical History  Procedure Laterality Date  . Temporomandibular joint surgery    . Cataract extraction      bilateral  . Breast surgery      breast biopsy X 3  . Eye surgery      bilateral cataract extraction  . Mastectomy modified radical Right 08/31/2014    Procedure: MASTECTOMY MODIFIED RADICAL;  Surgeon: Molly Maduro, MD;  Location: [facility] ORS;  Service: General;  Laterality: Right;  . Portacath placement Left 10/20/2014    Procedure: INSERTION PORT-A-CATH;  Surgeon: Marlyce Huge, MD;  Location: [facility] ORS;  Service: General;  Laterality: Left;    Family History  Problem Relation Age of Onset  . Peripheral Artery Disease Sister     carotid artery stenosis   . Heart disease Sister   . Diabetes Sister      Social History:  reports that she has never smoked. She has never used smokeless tobacco. She reports that she does not drink alcohol or use illicit drugs.  The patient is alone today.  Allergies: No Known Allergies  Current Medications: Current Outpatient Prescriptions  Medication Sig Dispense Refill  . acetaminophen (TYLENOL) 500 MG tablet Take 500 mg by mouth every 6 (six) hours as needed for mild pain or moderate pain.     [name] Kitchen aspirin 81 MG tablet Take 81 mg by mouth every other day.    . Calcium Carbonate (CALTRATE 600 PO) Take 1,200 mg by mouth daily.     . cholecalciferol (VITAMIN D) 400 UNITS TABS tablet Take 800 Units by mouth.    . Cyanocobalamin (VITAMIN B-12 IJ) Inject as directed every 30 (thirty) days.     . diphenhydrAMINE (BENADRYL) 25 mg capsule Take 25 mg by mouth every 6 (six) hours as needed for allergies.    . ferrous sulfate 325 (65 FE) MG tablet Take 325 mg by mouth daily with breakfast.     . lidocaine-prilocaine (EMLA) cream Apply 1 application topically as needed. Apply 1-2 hours prior to chemotherapy 30 g 1  . LORazepam (ATIVAN) 0.5 MG tablet Take 1 tablet (0.5 mg total) by mouth every 6 (six)  hours as needed (Take 0.9m by mouth every 6 hours as needed for Nausea and vomiting). 30 tablet 0  . metFORMIN (GLUCOPHAGE) 500 MG tablet Take 1 tablet (500 mg total) by mouth 2 (two) times daily with a meal. 180 tablet 1  . metoprolol tartrate (LOPRESSOR) 25 MG tablet Take 1 tablet (25 mg total) by mouth 2 (two) times daily. 180 tablet 1  . ondansetron (ZOFRAN) 8 MG tablet Take 1 tablet (8 mg total) by mouth every 8 (eight) hours as needed for nausea or vomiting. 20 tablet 0  . oxyCODONE-acetaminophen (PERCOCET/ROXICET) 5-325 MG per tablet Take 1 tablet by mouth every 4 (four) hours as needed for severe pain. 20 tablet 0  . amLODipine (NORVASC) 5 MG tablet take 1 tablet by mouth once daily 90 tablet 1   No current facility-administered medications for this visit.    Facility-Administered Medications Ordered in Other Visits  Medication Dose Route Frequency Provider Last Rate Last Dose  . CARBOplatin (PARAPLATIN) 310 mg in sodium chloride 0.9 % 100 mL chemo infusion  310 mg Intravenous Once MLequita Asal MD   310 mg at 01/18/15 1708  . famotidine (PEPCID) IVPB 20 mg premix  20 mg Intravenous Q12H MLequita Asal MD   20 mg at 01/18/15 1511  . sodium chloride 0.9 % injection 10 mL  10 mL Intravenous PRN MLequita Asal MD        Review of Systems:  GENERAL: Feels good. No fevers or sweats. Weight stable. PERFORMANCE STATUS (ECOG): 1 HEENT:No visual changes, runny nose, sore throat, mouth sores or tenderness. Lungs: No shortness of breath or cough. No hemoptysis. Cardiac: No chest pain, palpitations, orthopnea, or PND. GI: No nausea, vomiting, diarrhea, constipation, melena or hematochezia. GU: No urgency, frequency, dysuria, or hematuria. Musculoskeletal: No back pain. No joint pain. No muscle tenderness. Extremities: No pain or swelling. Skin: Chest wall lesions without oozing.  Streaking on chest wall similar. Neuro: No headache, numbness or weakness, balance or coordination issues. Endocrine: No diabetes, thyroid issues, hot flashes or night sweats. Psych: No mood changes, depression or anxiety. Pain: No focal pain. Review of systems: All other systems reviewed and found to be negative.  Physical Exam: Blood pressure 147/77, pulse 85, temperature 98.2 F (36.8 C), temperature source Oral, weight 110 lb 10.7 oz (50.2 kg). GENERAL: Thin elderly woman sitting comfortably in a private room in the infusion area in no acute distress. MENTAL STATUS: Alert and oriented to person, place and time. HEAD: Short silver wig. Normocephalic, atraumatic, face symmetric, no Cushingoid features. EYES:  Blue eyes.  Pupils equal round and reactive to light and accomodation.  No conjunctivitis or scleral icterus. ENT:   Oropharynx clear without lesion.  Tongue normal. Mucous membranes moist.  RESPIRATORY:  Clear to auscultation without rales, wheezes or rhonchi. CARDIOVASCULAR:  Regular rate and rhythm without murmur, rub or gallop. BREAST: Right mastectomy. Breast lesions dry.  Nodularity measuring 4 x 5 cm medially (softer).  Medial lesion has a halo of erythema extending 1.5-2 cm circumferentially.  Irregular border streaking extends 7  cm superiorly and 4 cm inferiorly.  Difficult to measure, but mild confluent nodularity 3 x 2.5 cm, 1.5 x 2 cm along incision.  4 small nodular areas above incision.  3 fingertip nodules in far lateral axillae. BACK: Kyphosis. ABDOMEN:  Soft, non-tender, with active bowel sounds, and no hepatosplenomegaly.  No masses. SKIN:  Chest wall lesions as noted above. EXTREMITIES: No edema, no skin discoloration or tenderness.  No  palpable cords. LYMPH NODES: Fullness right axillae.  No cervical, supraclavicular or inguinal adenopathy. PSYCH: Appropriate.   Infusion on 02/15/2015  Component Date Value Ref Range Status  . WBC 02/15/2015 3.2* 3.6 - 11.0 K/uL Final  . RBC 02/15/2015 3.57* 3.80 - 5.20 MIL/uL Final  . Hemoglobin 02/15/2015 10.0* 12.0 - 16.0 g/dL Final  . HCT 02/15/2015 30.4* 35.0 - 47.0 % Final  . MCV 02/15/2015 85.1  80.0 - 100.0 fL Final  . MCH 02/15/2015 27.9  26.0 - 34.0 pg Final  . MCHC 02/15/2015 32.8  32.0 - 36.0 g/dL Final  . RDW 02/15/2015 23.4* 11.5 - 14.5 % Final  . Platelets 02/15/2015 216  150 - 440 K/uL Final  . Neutrophils Relative % 02/15/2015 57   Final  . Neutro Abs 02/15/2015 1.8  1.4 - 6.5 K/uL Final  . Lymphocytes Relative 02/15/2015 35   Final  . Lymphs Abs 02/15/2015 1.1  1.0 - 3.6 K/uL Final  . Monocytes Relative 02/15/2015 6   Final  . Monocytes Absolute 02/15/2015 0.2  0.2 - 0.9 K/uL Final  . Eosinophils Relative 02/15/2015 1   Final  . Eosinophils Absolute 02/15/2015 0.0  0 - 0.7 K/uL Final  . Basophils Relative 02/15/2015 1   Final   . Basophils Absolute 02/15/2015 0.0  0 - 0.1 K/uL Final  . Sodium 02/15/2015 136  135 - 145 mmol/L Final  . Potassium 02/15/2015 3.8  3.5 - 5.1 mmol/L Final  . Chloride 02/15/2015 105  101 - 111 mmol/L Final  . CO2 02/15/2015 25  22 - 32 mmol/L Final  . Glucose, Bld 02/15/2015 153* 65 - 99 mg/dL Final  . BUN 02/15/2015 18  6 - 20 mg/dL Final  . Creatinine, Ser 02/15/2015 0.53  0.44 - 1.00 mg/dL Final  . Calcium 02/15/2015 8.9  8.9 - 10.3 mg/dL Final  . Total Protein 02/15/2015 6.9  6.5 - 8.1 g/dL Final  . Albumin 02/15/2015 4.2  3.5 - 5.0 g/dL Final  . AST 02/15/2015 20  15 - 41 U/L Final  . ALT 02/15/2015 11* 14 - 54 U/L Final  . Alkaline Phosphatase 02/15/2015 48  38 - 126 U/L Final  . Total Bilirubin 02/15/2015 0.6  0.3 - 1.2 mg/dL Final  . GFR calc non Af Amer 02/15/2015 >60  >60 mL/min Final  . GFR calc Af Amer 02/15/2015 >60  >60 mL/min Final   Comment: (NOTE) The eGFR has been calculated using the CKD EPI equation. This calculation has not been validated in all clinical situations. eGFR's persistently <60 mL/min signify possible Chronic Kidney Disease.   . Anion gap 02/15/2015 6  5 - 15 Final    Assessment:  TYNE BANTA is an 80 y.o. female with stage IIIC right breast cancer status post mastectomy and axillary lymph node dissection on 08/31/2014. 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 Carcinoma involved skeletal muscle and dermal lymphatics. Tumor was less than 0.5 mm from the deep margin. Thirteen of 15 lymph nodes were involved. The largest metastatic focus was 2 cm. Tumor is triple negative (ER negative, PR negative, and HER-2/neu negative). Pathologic stage was T3N3aMx.  PET scan on 09/21/2014 revealed postoperative changes in the right chest with no suspicious findings or residual tumor. Bone scan on 10/11/2014 revealed no evidence of metastatic disease.  Bone density study on 10/02/2014 revealed a T  score of -4.7 in the forearm consistent with osteoporosis. T-score was less than 2.5 in the spine or  hip. Echocardiogram on 10/02/2014 revealed ejection fraction of 60-65%.  She has iron deficiency anemia. Labs on 09/19/2014 revealed a hematocrit of 29.9, hemoglobin 8.7, MCV 69.7, ferritin 9, and TIBC 616. B12 was low (265) with a prior history of B12 deficiency and need for supplementation (stopped 06/2014). She began B12 on 10/12/2014 (last 01/18/2015).  Folate was normal. Her diet is modest. She has never had a colonoscopy. She denies any melena or hematochezia.  She continues to tolerate 1 iron pill a day.  She is eating red meat.  Hematocrit is stable to slightly improved.  Chest wall biopsy x 3 (lateral, medial, and middle sites) on 11/08/2014 revealed dermal lymphatic involvement by invasive mammary carcinoma with micropapillary features.    She has aggressive disease with growth despite several chemotherapeutic agents.  She received 5 weeks of Taxol (11/03/2014 - 12/01/2014).  She received 1 week of adriamycin (12/14/2014).  She received 1 cycle of  carboplatin (12/28/2014).  After carboplatin, disease appeared to stabilize, but with 2 weeks off of therapy, her disease grew again (medial lesion larger and weaping).   She is currently day 8 of cycle #2 carboplatin and Taxol (09/22/206- 02/09/2015).  Chest wall lesions are grossly stable.   Plan: 1.  Labs today:  CBC with diff, CMP. 2.  Medical photographs 3.  Day 8 of cycle #2 carboplatin and Taxol today. 4.  RTC in 1 week for MD assess and labs (CBC with diff).   [name], MD  02/09/2015 , 10:48 AM

## 2015-03-16 ENCOUNTER — Telehealth: Payer: Self-pay | Admitting: *Deleted

## 2015-03-16 MED ORDER — LORATADINE 10 MG PO TABS: 10.0000 mg | ORAL_TABLET | Freq: Every day | ORAL | Status: DC

## 2015-03-16 MED ORDER — AZITHROMYCIN 250 MG PO TABS: ORAL_TABLET | ORAL | Status: DC

## 2015-03-16 NOTE — Telephone Encounter (Signed)
Called to report she has nasal congestion, and is hoarse. She is scheduled for chemo next week and does not want it delayed, so is asking for med. Denies fever. Coughing non productive. She has been taking benadryl.

## 2015-03-16 NOTE — Telephone Encounter (Signed)
Claritin and Zpak e scribed per VO Dr Rogue Bussing. Pt informed

## 2015-03-19 ENCOUNTER — Telehealth: Payer: Self-pay

## 2015-03-19 NOTE — Telephone Encounter (Signed)
Returned pt's phone call message left on voicemail.  Pt did not answer left message for pt to please call back and speak with triage also if I did not answer right away

## 2015-03-20 ENCOUNTER — Other Ambulatory Visit: Payer: Self-pay | Admitting: Hematology and Oncology

## 2015-03-21 ENCOUNTER — Inpatient Hospital Stay: Payer: PPO

## 2015-03-21 ENCOUNTER — Inpatient Hospital Stay (HOSPITAL_BASED_OUTPATIENT_CLINIC_OR_DEPARTMENT_OTHER): Payer: PPO | Admitting: Hematology and Oncology

## 2015-03-21 VITALS — BP 144/67 | HR 83 | Temp 96.0°F | Resp 18 | Ht 60.0 in | Wt 111.7 lb

## 2015-03-21 DIAGNOSIS — I2510 Atherosclerotic heart disease of native coronary artery without angina pectoris: Secondary | ICD-10-CM

## 2015-03-21 DIAGNOSIS — E876 Hypokalemia: Secondary | ICD-10-CM

## 2015-03-21 DIAGNOSIS — Z171 Estrogen receptor negative status [ER-]: Secondary | ICD-10-CM

## 2015-03-21 DIAGNOSIS — E785 Hyperlipidemia, unspecified: Secondary | ICD-10-CM

## 2015-03-21 DIAGNOSIS — Z7984 Long term (current) use of oral hypoglycemic drugs: Secondary | ICD-10-CM

## 2015-03-21 DIAGNOSIS — C779 Secondary and unspecified malignant neoplasm of lymph node, unspecified: Secondary | ICD-10-CM

## 2015-03-21 DIAGNOSIS — C50911 Malignant neoplasm of unspecified site of right female breast: Secondary | ICD-10-CM

## 2015-03-21 DIAGNOSIS — E538 Deficiency of other specified B group vitamins: Secondary | ICD-10-CM

## 2015-03-21 DIAGNOSIS — R0989 Other specified symptoms and signs involving the circulatory and respiratory systems: Secondary | ICD-10-CM

## 2015-03-21 DIAGNOSIS — D509 Iron deficiency anemia, unspecified: Secondary | ICD-10-CM

## 2015-03-21 DIAGNOSIS — Z87442 Personal history of urinary calculi: Secondary | ICD-10-CM

## 2015-03-21 DIAGNOSIS — Z5111 Encounter for antineoplastic chemotherapy: Secondary | ICD-10-CM | POA: Diagnosis not present

## 2015-03-21 DIAGNOSIS — R21 Rash and other nonspecific skin eruption: Secondary | ICD-10-CM

## 2015-03-21 DIAGNOSIS — R439 Unspecified disturbances of smell and taste: Secondary | ICD-10-CM | POA: Diagnosis not present

## 2015-03-21 DIAGNOSIS — T451X5A Adverse effect of antineoplastic and immunosuppressive drugs, initial encounter: Secondary | ICD-10-CM

## 2015-03-21 DIAGNOSIS — E119 Type 2 diabetes mellitus without complications: Secondary | ICD-10-CM

## 2015-03-21 DIAGNOSIS — R000 Tachycardia, unspecified: Secondary | ICD-10-CM

## 2015-03-21 DIAGNOSIS — Z7982 Long term (current) use of aspirin: Secondary | ICD-10-CM

## 2015-03-21 DIAGNOSIS — M129 Arthropathy, unspecified: Secondary | ICD-10-CM

## 2015-03-21 DIAGNOSIS — I10 Essential (primary) hypertension: Secondary | ICD-10-CM

## 2015-03-21 DIAGNOSIS — D701 Agranulocytosis secondary to cancer chemotherapy: Secondary | ICD-10-CM

## 2015-03-21 DIAGNOSIS — R5383 Other fatigue: Secondary | ICD-10-CM

## 2015-03-21 DIAGNOSIS — Z9011 Acquired absence of right breast and nipple: Secondary | ICD-10-CM

## 2015-03-21 DIAGNOSIS — Z79899 Other long term (current) drug therapy: Secondary | ICD-10-CM

## 2015-03-21 LAB — COMPREHENSIVE METABOLIC PANEL
ALT: 12 U/L — ABNORMAL LOW (ref 14–54)
AST: 22 U/L (ref 15–41)
Albumin: 3.8 g/dL (ref 3.5–5.0)
Alkaline Phosphatase: 47 U/L (ref 38–126)
Anion gap: 9 (ref 5–15)
BUN: 14 mg/dL (ref 6–20)
CO2: 24 mmol/L (ref 22–32)
Calcium: 9.3 mg/dL (ref 8.9–10.3)
Chloride: 105 mmol/L (ref 101–111)
Creatinine, Ser: 0.45 mg/dL (ref 0.44–1.00)
GFR calc Af Amer: >60 mL/min (ref >60)
GFR calc non Af Amer: >60 mL/min (ref >60)
Glucose, Bld: 191 mg/dL — ABNORMAL HIGH (ref 65–99)
Potassium: 3.3 mmol/L — ABNORMAL LOW (ref 3.5–5.1)
Sodium: 138 mmol/L (ref 135–145)
Total Bilirubin: 0.6 mg/dL (ref 0.3–1.2)
Total Protein: 6.5 g/dL (ref 6.5–8.1)

## 2015-03-21 LAB — CBC WITH DIFFERENTIAL/PLATELET
Basophils Absolute: 0 10*3/uL (ref 0–0.1)
Basophils Relative: 1 %
Eosinophils Absolute: 0 10*3/uL (ref 0–0.7)
Eosinophils Relative: 0 %
HCT: 29.7 % — ABNORMAL LOW (ref 35.0–47.0)
Hemoglobin: 9.8 g/dL — ABNORMAL LOW (ref 12.0–16.0)
Lymphocytes Relative: 31 %
Lymphs Abs: 0.8 10*3/uL — ABNORMAL LOW (ref 1.0–3.6)
MCH: 29.3 pg (ref 26.0–34.0)
MCHC: 32.9 g/dL (ref 32.0–36.0)
MCV: 89.1 fL (ref 80.0–100.0)
Monocytes Absolute: 0.5 10*3/uL (ref 0.2–0.9)
Monocytes Relative: 18 %
Neutro Abs: 1.4 10*3/uL (ref 1.4–6.5)
Neutrophils Relative %: 50 %
Platelets: 223 10*3/uL (ref 150–440)
RBC: 3.33 MIL/uL — ABNORMAL LOW (ref 3.80–5.20)
RDW: 23 % — ABNORMAL HIGH (ref 11.5–14.5)
WBC: 2.7 10*3/uL — ABNORMAL LOW (ref 3.6–11.0)

## 2015-03-21 LAB — MAGNESIUM: Magnesium: 2 mg/dL (ref 1.7–2.4)

## 2015-03-21 MED ORDER — POTASSIUM CHLORIDE CRYS ER 10 MEQ PO TBCR: 10.0000 meq | EXTENDED_RELEASE_TABLET | Freq: Two times a day (BID) | ORAL | Status: DC

## 2015-03-21 MED ORDER — LORAZEPAM 0.5 MG PO TABS: 0.5000 mg | ORAL_TABLET | Freq: Four times a day (QID) | ORAL | Status: DC | PRN

## 2015-03-21 MED ORDER — SODIUM CHLORIDE 0.9 % IJ SOLN
10.0000 mL | INTRAMUSCULAR | Status: AC | PRN
  Administered 2015-03-21: 10 mL via INTRAVENOUS
  Filled 2015-03-21: qty 10

## 2015-03-21 MED ORDER — DEXAMETHASONE 4 MG PO TABS: ORAL_TABLET | ORAL | Status: DC

## 2015-03-21 MED ORDER — HEPARIN SOD (PORK) LOCK FLUSH 100 UNIT/ML IV SOLN
500.0000 [IU] | Freq: Once | INTRAVENOUS | Status: AC
  Administered 2015-03-21: 500 [IU] via INTRAVENOUS
  Filled 2015-03-21: qty 5

## 2015-03-21 MED ORDER — ONDANSETRON HCL 8 MG PO TABS: 8.0000 mg | ORAL_TABLET | Freq: Two times a day (BID) | ORAL | Status: DC | PRN

## 2015-03-21 NOTE — Progress Notes (Signed)
Dawn Wiley is an 80 y.o. female with stage IIIC right breast cancer who is seen for assessment prior to cycle #1 AC.  HPI: The patient was last seen in the medical oncology clinic on 03/08/2015.  At that time, she was day 8 of cycle #3 carboplatin and Taxol.  Chest wall lesions appeared to be extending beneath the surface and becoming more confluent.  Following her clinic appointment, medical photographs from the last 3 cycles were compared.  Lesions had grown as well as the extent of disease.  The patient was notified.  Discussions were held regarding consideration of AC with Neulasta support.  Side effects reviewed.  During the interim, she notes being hoarse with congestion. Z-Pak was prescribed. She has had no fever. She notes "some chunky white mucus yesterday and a rattle".  She has had some mild nasal congestion. She completed her antibodies yesterday.  She has had a change in taste sensation. She is forcing herself to eat. Weight is stable.  Past Medical History  Diagnosis Date  . Kidney stones   . Hyperlipidemia   . Coronary artery disease     Coronary calcifications noted on CT scan  . Diabetes mellitus without complication ([place])   . Nephrolithiasis   . Hypertension   . Arthritis   . Anemia   . Tachycardia     Dr. Fletcher Anon, cardiologist  . Cancer [facility])     breast (right)  . PONV (postoperative nausea and vomiting)   . Rash     back  . Breast cancer [facility])     Past Surgical History  Procedure Laterality Date  . Temporomandibular joint surgery    . Cataract extraction      bilateral  . Breast surgery      breast biopsy X 3  . Eye surgery      bilateral cataract extraction  . Mastectomy modified radical Right 08/31/2014    Procedure: MASTECTOMY MODIFIED RADICAL;  Surgeon: Molly Maduro, MD;  Location: [facility] ORS;  Service: General;  Laterality: Right;  . Portacath placement  Left 10/20/2014    Procedure: INSERTION PORT-A-CATH;  Surgeon: Marlyce Huge, MD;  Location: [facility] ORS;  Service: General;  Laterality: Left;    Family History  Problem Relation Age of Onset  . Peripheral Artery Disease Sister     carotid artery stenosis   . Heart disease Sister   . Diabetes Sister     Social History:  reports that she has never smoked. She has never used smokeless tobacco. She reports that she does not drink alcohol or use illicit drugs.    Allergies: No Known Allergies  Current Medications: Current Outpatient Prescriptions  Medication Sig Dispense Refill  . acetaminophen (TYLENOL) 500 MG tablet Take 500 mg by mouth every 6 (six) hours as needed for mild pain or moderate pain.     [name] Kitchen amLODipine (NORVASC) 5 MG tablet take 1 tablet by mouth once daily 90 tablet 1  . aspirin 81 MG tablet Take 81 mg by mouth every other day.    [name] Kitchen azithromycin (ZITHROMAX) 250 MG tablet Take 2 tabs on day 1, then 1 tab daily days 2-5 6 each 0  . Calcium Carbonate (CALTRATE 600 PO) Take 1,200 mg by mouth daily.     . cholecalciferol (VITAMIN D) 400 UNITS TABS tablet Take 800 Units by mouth.    . Cyanocobalamin (VITAMIN B-12 IJ) Inject as directed every  30 (thirty) days.     [name] Kitchen dexamethasone (DECADRON) 4 MG tablet Take 2 tablets by mouth once a day on the day after chemotherapy and then take 2 tablets two times a day for 2 days. Take with food. 30 tablet 1  . diphenhydrAMINE (BENADRYL) 25 mg capsule Take 25 mg by mouth every 6 (six) hours as needed for allergies.    . ferrous sulfate 325 (65 FE) MG tablet Take 325 mg by mouth daily with breakfast.     . lidocaine-prilocaine (EMLA) cream Apply 1 application topically as needed. Apply 1-2 hours prior to chemotherapy 30 g 1  . loratadine (CLARITIN) 10 MG tablet Take 1 tablet (10 mg total) by mouth daily. 30 tablet 0  . LORazepam (ATIVAN) 0.5 MG tablet Take 1 tablet (0.5 mg total) by mouth every 6 (six) hours as needed (Nausea or vomiting).  30 tablet 0  . metFORMIN (GLUCOPHAGE) 500 MG tablet Take 1 tablet (500 mg total) by mouth 2 (two) times daily with a meal. 180 tablet 1  . ondansetron (ZOFRAN) 8 MG tablet Take 1 tablet (8 mg total) by mouth 2 (two) times daily as needed. Start on the third day after chemotherapy. 30 tablet 1  . oxyCODONE-acetaminophen (PERCOCET/ROXICET) 5-325 MG per tablet Take 1 tablet by mouth every 4 (four) hours as needed for severe pain. 20 tablet 0  . potassium chloride SA (K-DUR,KLOR-CON) 10 MEQ tablet Take 1 tablet (10 mEq total) by mouth 2 (two) times daily. For 3 days 10 tablet 1  . HYDROcodone-acetaminophen (NORCO/VICODIN) 5-325 MG tablet Take 1 tablet by mouth every 6 (six) hours as needed for moderate pain. 20 tablet 0  . metoprolol tartrate (LOPRESSOR) 25 MG tablet Take 1 tablet (25 mg total) by mouth 2 (two) times daily. 180 tablet 2   No current facility-administered medications for this visit.   Facility-Administered Medications Ordered in Other Visits  Medication Dose Route Frequency Provider Last Rate Last Dose  . sodium chloride 0.9 % injection 10 mL  10 mL Intravenous PRN [name], MD      . sodium chloride 0.9 % injection 10 mL  10 mL Intravenous PRN [name], MD   10 mL at 03/21/15 0849    Review of Systems:  GENERAL: Feels "ok". No fevers or sweats. Weight up 2 pounds. PERFORMANCE STATUS (ECOG): 1 HEENT:Hoarse.  Mild nasal congestion.  Change in taste.  No visual changes, sore throat, mouth sores or tenderness. Lungs: No shortness of breath or cough. Chunky white mucus.  No hemoptysis. Cardiac: No chest pain, palpitations, orthopnea, or PND. GI: No nausea, vomiting, diarrhea, constipation, melena or hematochezia. GU: No urgency, frequency, dysuria, or hematuria. Musculoskeletal: No back pain. No joint pain. No muscle tenderness. Extremities: No pain or swelling. Skin: Chest wall lesions (growing). Neuro: No headache, numbness or weakness, balance  or coordination issues. Endocrine: Diabetes.  No thyroid issues, hot flashes or night sweats. Psych: No mood changes, depression or anxiety. Pain: No focal pain. Review of systems: All other systems reviewed and found to be negative.  Physical Exam: Blood pressure 144/67, pulse 83, temperature 96 F (35.6 C), temperature source Tympanic, resp. rate 18, height 5' (1.524 m), weight 111 lb 10.6 oz (50.65 kg). GENERAL: Thin elderly woman sitting comfortably in the exam room in no acute distress. MENTAL STATUS: Alert and oriented to person, place and time. HEAD: Short silver wig. Normocephalic, atraumatic, face symmetric, no Cushingoid features. EYES: Blue eyes. Pupils equal round and reactive to light  and accomodation. No conjunctivitis or scleral icterus. ENT: Oropharynx clear without lesion. Tongue normal. Mucous membranes moist.  RESPIRATORY: Clear to auscultation without rales, wheezes or rhonchi. CARDIOVASCULAR: Regular rate and rhythm without murmur, rub or gallop. BREAST: Right mastectomy. Breast lesions dry.  Nodularity measuring 7.5 x 5.5 cm medially.  Medial lesion has a halo of erythema extending 3.5 cm medially, 3 cm superiorly, and 1 cm inferiorly.  Irregular border streaking extends 9-9.5 cm superiorly and 4 cm inferiorly.  Palpable area laterally along incision 3 cm x 2.5 cm, 2.5 x 2 cm and 3 x 2.5 cm. Disease extends 23.5 cm medial to lateral along incision into axillae. ABDOMEN: Soft, non-tender, with active bowel sounds, and no hepatosplenomegaly. No masses. SKIN: Chest wall lesions as above. EXTREMITIES: No edema, no skin discoloration or tenderness. No palpable cords. LYMPH NODES: No palpable cervical, supraclavicular, axillary or inguinal adenopathy  NEUROLOGICAL: Unremarkable. PSYCH: Appropriate.   Infusion on 03/21/2015  Component Date Value Ref Range Status  . WBC 03/21/2015 2.7* 3.6 - 11.0 K/uL Final   A-LINE  . RBC 03/21/2015 3.33* 3.80 - 5.20  MIL/uL Final  . Hemoglobin 03/21/2015 9.8* 12.0 - 16.0 g/dL Final  . HCT 03/21/2015 29.7* 35.0 - 47.0 % Final  . MCV 03/21/2015 89.1  80.0 - 100.0 fL Final  . MCH 03/21/2015 29.3  26.0 - 34.0 pg Final  . MCHC 03/21/2015 32.9  32.0 - 36.0 g/dL Final  . RDW 03/21/2015 23.0* 11.5 - 14.5 % Final  . Platelets 03/21/2015 223  150 - 440 K/uL Final  . Neutrophils Relative % 03/21/2015 50   Final  . Neutro Abs 03/21/2015 1.4  1.4 - 6.5 K/uL Final  . Lymphocytes Relative 03/21/2015 31   Final  . Lymphs Abs 03/21/2015 0.8* 1.0 - 3.6 K/uL Final  . Monocytes Relative 03/21/2015 18   Final  . Monocytes Absolute 03/21/2015 0.5  0.2 - 0.9 K/uL Final  . Eosinophils Relative 03/21/2015 0   Final  . Eosinophils Absolute 03/21/2015 0.0  0 - 0.7 K/uL Final  . Basophils Relative 03/21/2015 1   Final  . Basophils Absolute 03/21/2015 0.0  0 - 0.1 K/uL Final  . Sodium 03/21/2015 138  135 - 145 mmol/L Final  . Potassium 03/21/2015 3.3* 3.5 - 5.1 mmol/L Final  . Chloride 03/21/2015 105  101 - 111 mmol/L Final  . CO2 03/21/2015 24  22 - 32 mmol/L Final  . Glucose, Bld 03/21/2015 191* 65 - 99 mg/dL Final  . BUN 03/21/2015 14  6 - 20 mg/dL Final  . Creatinine, Ser 03/21/2015 0.45  0.44 - 1.00 mg/dL Final  . Calcium 03/21/2015 9.3  8.9 - 10.3 mg/dL Final  . Total Protein 03/21/2015 6.5  6.5 - 8.1 g/dL Final  . Albumin 03/21/2015 3.8  3.5 - 5.0 g/dL Final  . AST 03/21/2015 22  15 - 41 U/L Final  . ALT 03/21/2015 12* 14 - 54 U/L Final  . Alkaline Phosphatase 03/21/2015 47  38 - 126 U/L Final  . Total Bilirubin 03/21/2015 0.6  0.3 - 1.2 mg/dL Final  . GFR calc non Af Amer 03/21/2015 >60  >60 mL/min Final  . GFR calc Af Amer 03/21/2015 >60  >60 mL/min Final   Comment: (NOTE) The eGFR has been calculated using the CKD EPI equation. This calculation has not been validated in all clinical situations. eGFR's persistently <60 mL/min signify possible Chronic Kidney Disease.   . Anion gap 03/21/2015 9  5 - 15 Final   .  Magnesium 03/21/2015 2.0  1.7 - 2.4 mg/dL Final    Assessment:  Dawn Wiley is an 80 y.o. female with stage IIIC right breast cancer status post mastectomy and axillary lymph node dissection on 08/31/2014. 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 Carcinoma involved skeletal muscle and dermal lymphatics. Tumor was less than 0.5 mm from the deep margin. Thirteen of 15 lymph nodes were involved. The largest metastatic focus was 2 cm. Tumor is triple negative (ER negative, PR negative, and HER-2/neu negative). Pathologic stage was [id number].  PET scan on 09/21/2014 revealed postoperative changes in the right chest with no suspicious findings or residual tumor. Bone scan on 10/11/2014 revealed no evidence of metastatic disease.  Bone density study on 10/02/2014 revealed a T score of -4.7 in the forearm consistent with osteoporosis. T-score was less than 2.5 in the spine or hip. Echocardiogram on 10/02/2014 revealed ejection fraction of 60-65%.  She has iron deficiency anemia. Labs on 09/19/2014 revealed a hematocrit of 29.9, hemoglobin 8.7, MCV 69.7, ferritin 9, and TIBC 616. B12 was low (265) with a prior history of B12 deficiency and need for supplementation (stopped 06/2014). She began B12 on 10/12/2014 (last 01/18/2015).  Folate was normal. Her diet is modest. She has never had a colonoscopy. She denies any melena or hematochezia.  She continues to tolerate 1 iron pill a day.  She is eating red meat.  Hematocrit is stable to slightly improved.  Chest wall biopsy x 3 (lateral, medial, and middle sites) on 11/08/2014 revealed dermal lymphatic involvement by invasive mammary carcinoma with micropapillary features.    She has aggressive disease with growth despite several chemotherapeutic agents.  She received 5 weeks of Taxol (11/03/2014 - 12/01/2014).  She received 1 week of adriamycin (12/14/2014).  She received 1 cycle of   carboplatin (12/28/2014).  After carboplatin, disease appeared to stabilize, but with 2 weeks off of therapy, her disease grew again (medial lesion larger and weaping).   She is status post 3 cycles of carboplatin and Taxol (01/18/2015 - 03/02/2015).  Chest wall lesions are growing.  Counts have not fully recovered.  She has mild hypokalemia.  Plan: 1.  Labs today:  CBC with diff, CMP, Mg. 2.  No chemotherapy today. 3.  RTC on 11/28 for labs (CBC with diff, CMP), and cycle #1 AC with Neulasta (On-Pro) support. 4.  RTC on 12/07 or 12/08 for MD nadir assessment (CBC with diff, BMP).   [name], MD  03/21/2015 , 8:59 AM

## 2015-03-21 NOTE — Progress Notes (Signed)
NO changes since last MD visit.

## 2015-03-26 ENCOUNTER — Inpatient Hospital Stay: Payer: PPO

## 2015-03-26 VITALS — BP 131/70 | HR 81 | Temp 97.4°F | Resp 20

## 2015-03-26 DIAGNOSIS — E538 Deficiency of other specified B group vitamins: Secondary | ICD-10-CM

## 2015-03-26 DIAGNOSIS — D701 Agranulocytosis secondary to cancer chemotherapy: Secondary | ICD-10-CM

## 2015-03-26 DIAGNOSIS — Z5111 Encounter for antineoplastic chemotherapy: Secondary | ICD-10-CM | POA: Diagnosis not present

## 2015-03-26 DIAGNOSIS — T451X5A Adverse effect of antineoplastic and immunosuppressive drugs, initial encounter: Secondary | ICD-10-CM

## 2015-03-26 DIAGNOSIS — C50911 Malignant neoplasm of unspecified site of right female breast: Secondary | ICD-10-CM

## 2015-03-26 DIAGNOSIS — E876 Hypokalemia: Secondary | ICD-10-CM

## 2015-03-26 LAB — CBC WITH DIFFERENTIAL/PLATELET
Basophils Absolute: 0 10*3/uL (ref 0–0.1)
Basophils Relative: 1 %
Eosinophils Absolute: 0 10*3/uL (ref 0–0.7)
Eosinophils Relative: 1 %
HCT: 32.2 % — ABNORMAL LOW (ref 35.0–47.0)
Hemoglobin: 10.4 g/dL — ABNORMAL LOW (ref 12.0–16.0)
Lymphocytes Relative: 24 %
Lymphs Abs: 1 10*3/uL (ref 1.0–3.6)
MCH: 29.2 pg (ref 26.0–34.0)
MCHC: 32.4 g/dL (ref 32.0–36.0)
MCV: 90.1 fL (ref 80.0–100.0)
Monocytes Absolute: 0.6 10*3/uL (ref 0.2–0.9)
Monocytes Relative: 14 %
Neutro Abs: 2.5 10*3/uL (ref 1.4–6.5)
Neutrophils Relative %: 60 %
Platelets: 216 10*3/uL (ref 150–440)
RBC: 3.58 MIL/uL — ABNORMAL LOW (ref 3.80–5.20)
RDW: 23.1 % — ABNORMAL HIGH (ref 11.5–14.5)
WBC: 4.1 10*3/uL (ref 3.6–11.0)

## 2015-03-26 LAB — COMPREHENSIVE METABOLIC PANEL
ALT: 10 U/L — ABNORMAL LOW (ref 14–54)
AST: 19 U/L (ref 15–41)
Albumin: 4.2 g/dL (ref 3.5–5.0)
Alkaline Phosphatase: 53 U/L (ref 38–126)
Anion gap: 9 (ref 5–15)
BUN: 16 mg/dL (ref 6–20)
CO2: 25 mmol/L (ref 22–32)
Calcium: 9.7 mg/dL (ref 8.9–10.3)
Chloride: 105 mmol/L (ref 101–111)
Creatinine, Ser: 0.5 mg/dL (ref 0.44–1.00)
GFR calc Af Amer: >60 mL/min (ref >60)
GFR calc non Af Amer: >60 mL/min (ref >60)
Glucose, Bld: 197 mg/dL — ABNORMAL HIGH (ref 65–99)
Potassium: 3.7 mmol/L (ref 3.5–5.1)
Sodium: 139 mmol/L (ref 135–145)
Total Bilirubin: 0.4 mg/dL (ref 0.3–1.2)
Total Protein: 7 g/dL (ref 6.5–8.1)

## 2015-03-26 MED ORDER — FOSAPREPITANT DIMEGLUMINE INJECTION 150 MG
Freq: Once | INTRAVENOUS | Status: AC
  Administered 2015-03-26: 11:00:00 via INTRAVENOUS
  Filled 2015-03-26: qty 5

## 2015-03-26 MED ORDER — CYANOCOBALAMIN 1000 MCG/ML IJ SOLN
1000.0000 ug | Freq: Once | INTRAMUSCULAR | Status: AC
  Administered 2015-03-26: 1000 ug via INTRAMUSCULAR

## 2015-03-26 MED ORDER — SODIUM CHLORIDE 0.9 % IJ SOLN
10.0000 mL | INTRAMUSCULAR | Status: DC | PRN
  Administered 2015-03-26: 10 mL via INTRAVENOUS
  Filled 2015-03-26: qty 10

## 2015-03-26 MED ORDER — PEGFILGRASTIM 6 MG/0.6ML ~~LOC~~ PSKT
6.0000 mg | PREFILLED_SYRINGE | Freq: Once | SUBCUTANEOUS | Status: AC
  Administered 2015-03-26: 6 mg via SUBCUTANEOUS
  Filled 2015-03-26: qty 0.6

## 2015-03-26 MED ORDER — SODIUM CHLORIDE 0.9 % IV SOLN
600.0000 mg/m2 | Freq: Once | INTRAVENOUS | Status: AC
  Administered 2015-03-26: 880 mg via INTRAVENOUS
  Filled 2015-03-26: qty 44

## 2015-03-26 MED ORDER — PALONOSETRON HCL INJECTION 0.25 MG/5ML
0.2500 mg | Freq: Once | INTRAVENOUS | Status: AC
Start: 2015-03-26 — End: 2015-03-26
  Administered 2015-03-26: 0.25 mg via INTRAVENOUS
  Filled 2015-03-26: qty 5

## 2015-03-26 MED ORDER — HEPARIN SOD (PORK) LOCK FLUSH 100 UNIT/ML IV SOLN
500.0000 [IU] | Freq: Once | INTRAVENOUS | Status: AC
  Administered 2015-03-26: 500 [IU] via INTRAVENOUS
  Filled 2015-03-26: qty 5

## 2015-03-26 MED ORDER — SODIUM CHLORIDE 0.9 % IV SOLN
Freq: Once | INTRAVENOUS | Status: AC
  Administered 2015-03-26: 11:00:00 via INTRAVENOUS
  Filled 2015-03-26: qty 1000

## 2015-03-26 MED ORDER — DOXORUBICIN HCL CHEMO IV INJECTION 2 MG/ML
60.0000 mg/m2 | Freq: Once | INTRAVENOUS | Status: AC
  Administered 2015-03-26: 88 mg via INTRAVENOUS
  Filled 2015-03-26: qty 44

## 2015-03-27 ENCOUNTER — Telehealth: Payer: Self-pay | Admitting: *Deleted

## 2015-03-27 NOTE — Telephone Encounter (Signed)
Patient reports that she had her first chemo yesterday and since last night she is having pain in her breast and under her arm on the cancer side. Asking if she needs to be evaluated.

## 2015-03-27 NOTE — Telephone Encounter (Signed)
Called pt and she states it is a burning feeling that started around midnight at the nodule coming out from chest  In front and then around the side at axilla.  She has a white blister on the one in front and around the side she has a blister but not white yet. She took 2 tylenol at midnight and 2 at 5 am and now she feels better.  She just wondered if it was from chemo or something else and did she need to be seen.  Offered to see her today with Dr. Rudean Hitt and she only wants to see [place].  The pt also asked me to ask corcoran if she should take asa, vit. b 12, vit. D, and iron pill.  She read on cytoxan that she should not be taking vitamins and asa while on cytoxan.  I called corcoran at home and she called me back and told me that she felt like it was chemo side effects of working on the tumor but with white blister she felt it could poss. Be shingles and she felt that if pt wants to be seen tom. In [place] and she would be happy to see her. It is ok for her to take the vitamins and asa.  I called pt back and told her and she will come at 9:30 tom. And appt has been made.  Brandi helped me to find a time to see pt

## 2015-03-28 ENCOUNTER — Other Ambulatory Visit: Payer: Self-pay

## 2015-03-28 ENCOUNTER — Inpatient Hospital Stay (HOSPITAL_BASED_OUTPATIENT_CLINIC_OR_DEPARTMENT_OTHER): Payer: PPO | Admitting: Hematology and Oncology

## 2015-03-28 VITALS — BP 133/68 | HR 89 | Temp 96.5°F | Resp 18 | Ht 60.0 in | Wt 111.3 lb

## 2015-03-28 DIAGNOSIS — R000 Tachycardia, unspecified: Secondary | ICD-10-CM

## 2015-03-28 DIAGNOSIS — C779 Secondary and unspecified malignant neoplasm of lymph node, unspecified: Secondary | ICD-10-CM | POA: Diagnosis not present

## 2015-03-28 DIAGNOSIS — I10 Essential (primary) hypertension: Secondary | ICD-10-CM

## 2015-03-28 DIAGNOSIS — I2510 Atherosclerotic heart disease of native coronary artery without angina pectoris: Secondary | ICD-10-CM

## 2015-03-28 DIAGNOSIS — Z5111 Encounter for antineoplastic chemotherapy: Secondary | ICD-10-CM | POA: Diagnosis not present

## 2015-03-28 DIAGNOSIS — R5383 Other fatigue: Secondary | ICD-10-CM

## 2015-03-28 DIAGNOSIS — Z79899 Other long term (current) drug therapy: Secondary | ICD-10-CM

## 2015-03-28 DIAGNOSIS — D509 Iron deficiency anemia, unspecified: Secondary | ICD-10-CM

## 2015-03-28 DIAGNOSIS — E538 Deficiency of other specified B group vitamins: Secondary | ICD-10-CM

## 2015-03-28 DIAGNOSIS — Z171 Estrogen receptor negative status [ER-]: Secondary | ICD-10-CM

## 2015-03-28 DIAGNOSIS — Z9011 Acquired absence of right breast and nipple: Secondary | ICD-10-CM

## 2015-03-28 DIAGNOSIS — Z7982 Long term (current) use of aspirin: Secondary | ICD-10-CM

## 2015-03-28 DIAGNOSIS — G893 Neoplasm related pain (acute) (chronic): Secondary | ICD-10-CM | POA: Diagnosis not present

## 2015-03-28 DIAGNOSIS — C50911 Malignant neoplasm of unspecified site of right female breast: Secondary | ICD-10-CM

## 2015-03-28 DIAGNOSIS — E875 Hyperkalemia: Secondary | ICD-10-CM

## 2015-03-28 DIAGNOSIS — M129 Arthropathy, unspecified: Secondary | ICD-10-CM

## 2015-03-28 DIAGNOSIS — E119 Type 2 diabetes mellitus without complications: Secondary | ICD-10-CM

## 2015-03-28 DIAGNOSIS — Z87442 Personal history of urinary calculi: Secondary | ICD-10-CM

## 2015-03-28 DIAGNOSIS — Z7984 Long term (current) use of oral hypoglycemic drugs: Secondary | ICD-10-CM

## 2015-03-28 MED ORDER — METOPROLOL TARTRATE 25 MG PO TABS: 25.0000 mg | ORAL_TABLET | Freq: Two times a day (BID) | ORAL | Status: DC

## 2015-03-28 MED ORDER — HYDROCODONE-ACETAMINOPHEN 5-325 MG PO TABS: 1.0000 | ORAL_TABLET | Freq: Four times a day (QID) | ORAL | Status: DC | PRN

## 2015-03-28 NOTE — Progress Notes (Signed)
[facility] day:  03/28/2015   Chief Complaint: Dawn Wiley is an 80 y.o. female with stage IIIC right breast cancer who is seen for sick call visit on day 3 staus post cycle #1 adriamycin and Cytoxan.  HPI: The patient was last seen in the medical oncology clinic on 03/21/2015.  At that time, decision was made to postpone chemotherapy as counts had not fully recovered.  She received cycle #1 AC on 03/26/2015 followed by On-Pro Neulasta.  Monday night after chemotherapy she experienced pain in her chest wall associated with her known disease. She  took Tylenol 2. She then slept better on that side. Tuesday morning, the area was "pussy".  She has had no fever or chills.    Past Medical History  Diagnosis Date  . Kidney stones   . Hyperlipidemia   . Coronary artery disease     Coronary calcifications noted on CT scan  . Diabetes mellitus without complication ([place])   . Nephrolithiasis   . Hypertension   . Arthritis   . Anemia   . Tachycardia     Dr. Fletcher Anon, cardiologist  . Cancer [facility])     breast (right)  . PONV (postoperative nausea and vomiting)   . Rash     back  . Breast cancer [facility])     Past Surgical History  Procedure Laterality Date  . Temporomandibular joint surgery    . Cataract extraction      bilateral  . Breast surgery      breast biopsy X 3  . Eye surgery      bilateral cataract extraction  . Mastectomy modified radical Right 08/31/2014    Procedure: MASTECTOMY MODIFIED RADICAL;  Surgeon: Molly Maduro, MD;  Location: [facility] ORS;  Service: General;  Laterality: Right;  . Portacath placement Left 10/20/2014    Procedure: INSERTION PORT-A-CATH;  Surgeon: Marlyce Huge, MD;  Location: [facility] ORS;  Service: General;  Laterality: Left;    Family History  Problem Relation Age of Onset  . Peripheral Artery Disease Sister     carotid artery stenosis   . Heart disease Sister   . Diabetes Sister     Social  History:  reports that she has never smoked. She has never used smokeless tobacco. She reports that she does not drink alcohol or use illicit drugs.  The patient is accompanied by her daughter today.  Allergies: No Known Allergies  Current Medications: Current Outpatient Prescriptions  Medication Sig Dispense Refill  . acetaminophen (TYLENOL) 500 MG tablet Take 500 mg by mouth every 6 (six) hours as needed for mild pain or moderate pain.     [name] Kitchen amLODipine (NORVASC) 5 MG tablet take 1 tablet by mouth once daily 90 tablet 1  . aspirin 81 MG tablet Take 81 mg by mouth every other day.    [name] Kitchen azithromycin (ZITHROMAX) 250 MG tablet Take 2 tabs on day 1, then 1 tab daily days 2-5 6 each 0  . Calcium Carbonate (CALTRATE 600 PO) Take 1,200 mg by mouth daily.     . cholecalciferol (VITAMIN D) 400 UNITS TABS tablet Take 800 Units by mouth.    . Cyanocobalamin (VITAMIN B-12 IJ) Inject as directed every 30 (thirty) days.     [name] Kitchen dexamethasone (DECADRON) 4 MG tablet Take 2 tablets by mouth once a day on the day after chemotherapy and then take 2 tablets two times a day for 2 days. Take with food. 30 tablet 1  .  diphenhydrAMINE (BENADRYL) 25 mg capsule Take 25 mg by mouth every 6 (six) hours as needed for allergies.    . ferrous sulfate 325 (65 FE) MG tablet Take 325 mg by mouth daily with breakfast.     . lidocaine-prilocaine (EMLA) cream Apply 1 application topically as needed. Apply 1-2 hours prior to chemotherapy 30 g 1  . loratadine (CLARITIN) 10 MG tablet Take 1 tablet (10 mg total) by mouth daily. 30 tablet 0  . LORazepam (ATIVAN) 0.5 MG tablet Take 1 tablet (0.5 mg total) by mouth every 6 (six) hours as needed (Nausea or vomiting). 30 tablet 0  . metFORMIN (GLUCOPHAGE) 500 MG tablet Take 1 tablet (500 mg total) by mouth 2 (two) times daily with a meal. 180 tablet 1  . metoprolol tartrate (LOPRESSOR) 25 MG tablet Take 1 tablet (25 mg total) by mouth 2 (two) times daily. 180 tablet 1  . ondansetron (ZOFRAN)  8 MG tablet Take 1 tablet (8 mg total) by mouth 2 (two) times daily as needed. Start on the third day after chemotherapy. 30 tablet 1  . oxyCODONE-acetaminophen (PERCOCET/ROXICET) 5-325 MG per tablet Take 1 tablet by mouth every 4 (four) hours as needed for severe pain. 20 tablet 0  . potassium chloride SA (K-DUR,KLOR-CON) 10 MEQ tablet Take 1 tablet (10 mEq total) by mouth 2 (two) times daily. For 3 days 10 tablet 1   No current facility-administered medications for this visit.   Facility-Administered Medications Ordered in Other Visits  Medication Dose Route Frequency Provider Last Rate Last Dose  . sodium chloride 0.9 % injection 10 mL  10 mL Intravenous PRN [name], MD      . sodium chloride 0.9 % injection 10 mL  10 mL Intravenous PRN [name], MD   10 mL at 03/21/15 0849    Review of Systems:  GENERAL: Feels better today. No fevers or sweats. Weight stable. PERFORMANCE STATUS (ECOG): 1 HEENT:No visual changes, runny nose, sore throat, mouth sores or tenderness. Lungs: No shortness of breath or cough. No hemoptysis. Cardiac: No chest pain, palpitations, orthopnea, or PND. GI: No nausea, vomiting, diarrhea, constipation, melena or hematochezia. GU: No urgency, frequency, dysuria, or hematuria. Musculoskeletal: No back pain. No joint pain. No muscle tenderness. Extremities: No pain or swelling. Skin: Chest wall lesions, changing (see HPI). Neuro: No headache, numbness or weakness, balance or coordination issues. Endocrine: No diabetes, thyroid issues, hot flashes or night sweats. Psych: No mood changes, depression or anxiety. Pain: No focal pain. Review of systems: All other systems reviewed and found to be negative.  Physical Exam: Blood pressure 133/68, pulse 89, temperature 96.5 F (35.8 C), temperature source Tympanic, resp. rate 18, height 5' (1.524 m), weight 111 lb 5.3 oz (50.5 kg). GENERAL: Thin elderly woman sitting comfortably in a  private room in the infusion area in no acute distress. MENTAL STATUS: Alert and oriented to person, place and time. HEAD: Short silver wig. Normocephalic, atraumatic, face symmetric, no Cushingoid features. EYES: Blue eyes. Pupils equal round and reactive to light and accomodation. No conjunctivitis or scleral icterus. ENT: Oropharynx clear without lesion. Tongue normal. Mucous membranes moist.  RESPIRATORY: Clear to auscultation without rales, wheezes or rhonchi. CARDIOVASCULAR: Regular rate and rhythm without murmur, rub or gallop. BREAST: Right mastectomy. Breast lesions dry.  Nodularity measuring 6.5 x 5.5 cm medially. Medial lesion has a halo of erythema extending 2.5 cm medially. Irregular border streaking extends 8 cm superiorly and 4.5 cm inferiorly. Palpable area laterally along incision  3.5 cm, 2.5 cm. Disease extends 24 cm medial to lateral along incision into axillae. ABDOMEN: Soft, non-tender, with active bowel sounds, and no hepatosplenomegaly. No masses. SKIN: Chest wall lesions as above. EXTREMITIES: No edema, no skin discoloration or tenderness. No palpable cords. LYMPH NODES: No palpable cervical, supraclavicular, axillary or inguinal adenopathy  NEUROLOGICAL: Unremarkable. PSYCH: Appropriate.   Infusion on 03/26/2015  Component Date Value Ref Range Status  . WBC 03/26/2015 4.1  3.6 - 11.0 K/uL Final  . RBC 03/26/2015 3.58* 3.80 - 5.20 MIL/uL Final  . Hemoglobin 03/26/2015 10.4* 12.0 - 16.0 g/dL Final  . HCT 03/26/2015 32.2* 35.0 - 47.0 % Final  . MCV 03/26/2015 90.1  80.0 - 100.0 fL Final  . MCH 03/26/2015 29.2  26.0 - 34.0 pg Final  . MCHC 03/26/2015 32.4  32.0 - 36.0 g/dL Final  . RDW 03/26/2015 23.1* 11.5 - 14.5 % Final  . Platelets 03/26/2015 216  150 - 440 K/uL Final  . Neutrophils Relative % 03/26/2015 60   Final  . Neutro Abs 03/26/2015 2.5  1.4 - 6.5 K/uL Final  . Lymphocytes Relative 03/26/2015 24   Final  . Lymphs Abs 03/26/2015 1.0   1.0 - 3.6 K/uL Final  . Monocytes Relative 03/26/2015 14   Final  . Monocytes Absolute 03/26/2015 0.6  0.2 - 0.9 K/uL Final  . Eosinophils Relative 03/26/2015 1   Final  . Eosinophils Absolute 03/26/2015 0.0  0 - 0.7 K/uL Final  . Basophils Relative 03/26/2015 1   Final  . Basophils Absolute 03/26/2015 0.0  0 - 0.1 K/uL Final  . Sodium 03/26/2015 139  135 - 145 mmol/L Final  . Potassium 03/26/2015 3.7  3.5 - 5.1 mmol/L Final  . Chloride 03/26/2015 105  101 - 111 mmol/L Final  . CO2 03/26/2015 25  22 - 32 mmol/L Final  . Glucose, Bld 03/26/2015 197* 65 - 99 mg/dL Final  . BUN 03/26/2015 16  6 - 20 mg/dL Final  . Creatinine, Ser 03/26/2015 0.50  0.44 - 1.00 mg/dL Final  . Calcium 03/26/2015 9.7  8.9 - 10.3 mg/dL Final  . Total Protein 03/26/2015 7.0  6.5 - 8.1 g/dL Final  . Albumin 03/26/2015 4.2  3.5 - 5.0 g/dL Final  . AST 03/26/2015 19  15 - 41 U/L Final  . ALT 03/26/2015 10* 14 - 54 U/L Final  . Alkaline Phosphatase 03/26/2015 53  38 - 126 U/L Final  . Total Bilirubin 03/26/2015 0.4  0.3 - 1.2 mg/dL Final  . GFR calc non Af Amer 03/26/2015 >60  >60 mL/min Final  . GFR calc Af Amer 03/26/2015 >60  >60 mL/min Final   Comment: (NOTE) The eGFR has been calculated using the CKD EPI equation. This calculation has not been validated in all clinical situations. eGFR's persistently <60 mL/min signify possible Chronic Kidney Disease.   . Anion gap 03/26/2015 9  5 - 15 Final    Assessment:  Dawn Wiley is an 80 y.o. female with stage IIIC right breast cancer status post mastectomy and axillary lymph node dissection on 08/31/2014. 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 Carcinoma involved skeletal muscle and dermal lymphatics. Tumor was less than 0.5 mm from the deep margin. Thirteen of 15 lymph nodes were involved. The largest metastatic focus was 2 cm. Tumor is triple negative (ER negative, PR negative, and HER-2/neu  negative). Pathologic stage was [id number].  PET scan on 09/21/2014 revealed postoperative changes in the right chest  with no suspicious findings or residual tumor. Bone scan on 10/11/2014 revealed no evidence of metastatic disease.  Bone density study on 10/02/2014 revealed a T score of -4.7 in the forearm consistent with osteoporosis. T-score was less than 2.5 in the spine or hip. Echocardiogram on 10/02/2014 revealed ejection fraction of 60-65%.  She has iron deficiency anemia. Labs on 09/19/2014 revealed a hematocrit of 29.9, hemoglobin 8.7, MCV 69.7, ferritin 9, and TIBC 616. B12 was low (265) with a prior history of B12 deficiency and need for supplementation (stopped 06/2014). She began B12 on 10/12/2014 (last 01/18/2015).  Folate was normal. Her diet is modest. She has never had a colonoscopy. She denies any melena or hematochezia.  She continues to tolerate 1 iron pill a day.  She is eating red meat.  Hematocrit is stable to slightly improved.  Chest wall biopsy x 3 (lateral, medial, and middle sites) on 11/08/2014 revealed dermal lymphatic involvement by invasive mammary carcinoma with micropapillary features.    She has aggressive disease with growth despite several chemotherapeutic agents.  She received 5 weeks of Taxol (11/03/2014 - 12/01/2014).  She received 1 week of adriamycin (12/14/2014).  She received 1 cycle of  carboplatin (12/28/2014).  After carboplatin, disease appeared to stabilize, but with 2 weeks off of therapy, her disease grew again (medial lesion larger and weaping).   She is status post 3 cycles of carboplatin and Taxol (01/18/2015 - 03/02/2015).  Chest wall initially responded then began to grow.  She is currently day 3 status post cycle #1 AC (03/26/2015) with Neulasta support.  She has had some pain associated with her chest wall disease.  Disease appears to be responding.  Plan: 1.  Rx:  Hydrocodone 5/Tylenol 325 1 tablet po q 6 hours prn pain (dis: # 20). 2.   RTC as scheduled in [place] next Tuesday for MD assessment and labs. 3.  RTC on 12/13 in [place] for MD labs (CBC with diff, CMP, Mg). 4.  Nurse to call patient with lab results. 5.  RTC on 12/14 in [place] for MD assessment and cycle #2 AC if counts have recovered.   [name], MD  03/28/2015 , 10:18 AM

## 2015-03-28 NOTE — Progress Notes (Signed)
Pain the night of Nov 27th on right breast feeling like a "burning" "very uncomfortable".  Pt took one tylenol 500 mg that night and next morning upon awakening.  Since then no medication needed for pain.  No fever noted any of the days.  Pt reports "feeling better".

## 2015-04-03 ENCOUNTER — Telehealth: Payer: Self-pay | Admitting: Internal Medicine

## 2015-04-03 ENCOUNTER — Encounter: Payer: Self-pay | Admitting: Family Medicine

## 2015-04-03 ENCOUNTER — Inpatient Hospital Stay: Payer: PPO

## 2015-04-03 ENCOUNTER — Inpatient Hospital Stay: Payer: PPO | Attending: Hematology and Oncology | Admitting: Hematology and Oncology

## 2015-04-03 ENCOUNTER — Other Ambulatory Visit: Payer: Self-pay

## 2015-04-03 ENCOUNTER — Ambulatory Visit (INDEPENDENT_AMBULATORY_CARE_PROVIDER_SITE_OTHER): Payer: PPO | Admitting: Family Medicine

## 2015-04-03 VITALS — BP 122/64 | HR 95 | Temp 98.2°F | Ht 60.0 in | Wt 108.0 lb

## 2015-04-03 VITALS — BP 144/79 | HR 105 | Temp 95.7°F | Ht 60.0 in | Wt 108.1 lb

## 2015-04-03 DIAGNOSIS — Z7984 Long term (current) use of oral hypoglycemic drugs: Secondary | ICD-10-CM | POA: Insufficient documentation

## 2015-04-03 DIAGNOSIS — Z171 Estrogen receptor negative status [ER-]: Secondary | ICD-10-CM | POA: Diagnosis not present

## 2015-04-03 DIAGNOSIS — E876 Hypokalemia: Secondary | ICD-10-CM | POA: Diagnosis not present

## 2015-04-03 DIAGNOSIS — I10 Essential (primary) hypertension: Secondary | ICD-10-CM | POA: Diagnosis not present

## 2015-04-03 DIAGNOSIS — E119 Type 2 diabetes mellitus without complications: Secondary | ICD-10-CM | POA: Diagnosis not present

## 2015-04-03 DIAGNOSIS — R000 Tachycardia, unspecified: Secondary | ICD-10-CM

## 2015-04-03 DIAGNOSIS — R21 Rash and other nonspecific skin eruption: Secondary | ICD-10-CM | POA: Diagnosis not present

## 2015-04-03 DIAGNOSIS — Z5111 Encounter for antineoplastic chemotherapy: Secondary | ICD-10-CM | POA: Diagnosis not present

## 2015-04-03 DIAGNOSIS — D509 Iron deficiency anemia, unspecified: Secondary | ICD-10-CM | POA: Insufficient documentation

## 2015-04-03 DIAGNOSIS — M129 Arthropathy, unspecified: Secondary | ICD-10-CM | POA: Insufficient documentation

## 2015-04-03 DIAGNOSIS — R079 Chest pain, unspecified: Secondary | ICD-10-CM

## 2015-04-03 DIAGNOSIS — I2510 Atherosclerotic heart disease of native coronary artery without angina pectoris: Secondary | ICD-10-CM

## 2015-04-03 DIAGNOSIS — C50911 Malignant neoplasm of unspecified site of right female breast: Secondary | ICD-10-CM | POA: Insufficient documentation

## 2015-04-03 DIAGNOSIS — Z79899 Other long term (current) drug therapy: Secondary | ICD-10-CM | POA: Insufficient documentation

## 2015-04-03 DIAGNOSIS — E785 Hyperlipidemia, unspecified: Secondary | ICD-10-CM | POA: Insufficient documentation

## 2015-04-03 DIAGNOSIS — Z7689 Persons encountering health services in other specified circumstances: Secondary | ICD-10-CM | POA: Diagnosis not present

## 2015-04-03 DIAGNOSIS — M818 Other osteoporosis without current pathological fracture: Secondary | ICD-10-CM | POA: Insufficient documentation

## 2015-04-03 DIAGNOSIS — Z7982 Long term (current) use of aspirin: Secondary | ICD-10-CM

## 2015-04-03 DIAGNOSIS — Z87442 Personal history of urinary calculi: Secondary | ICD-10-CM | POA: Diagnosis not present

## 2015-04-03 DIAGNOSIS — K121 Other forms of stomatitis: Secondary | ICD-10-CM | POA: Diagnosis not present

## 2015-04-03 DIAGNOSIS — C779 Secondary and unspecified malignant neoplasm of lymph node, unspecified: Secondary | ICD-10-CM | POA: Diagnosis not present

## 2015-04-03 DIAGNOSIS — C7989 Secondary malignant neoplasm of other specified sites: Secondary | ICD-10-CM

## 2015-04-03 DIAGNOSIS — R5383 Other fatigue: Secondary | ICD-10-CM | POA: Diagnosis not present

## 2015-04-03 DIAGNOSIS — D702 Other drug-induced agranulocytosis: Secondary | ICD-10-CM

## 2015-04-03 DIAGNOSIS — E538 Deficiency of other specified B group vitamins: Secondary | ICD-10-CM | POA: Insufficient documentation

## 2015-04-03 DIAGNOSIS — D709 Neutropenia, unspecified: Secondary | ICD-10-CM | POA: Diagnosis not present

## 2015-04-03 LAB — CBC WITH DIFFERENTIAL/PLATELET
Basophils Absolute: 0 10*3/uL (ref 0–0.1)
Basophils Relative: 2 %
Eosinophils Absolute: 0 10*3/uL (ref 0–0.7)
Eosinophils Relative: 2 %
HCT: 29.3 % — ABNORMAL LOW (ref 35.0–47.0)
Hemoglobin: 9.7 g/dL — ABNORMAL LOW (ref 12.0–16.0)
Lymphocytes Relative: 38 %
Lymphs Abs: 0.3 10*3/uL — ABNORMAL LOW (ref 1.0–3.6)
MCH: 30 pg (ref 26.0–34.0)
MCHC: 33 g/dL (ref 32.0–36.0)
MCV: 90.8 fL (ref 80.0–100.0)
Monocytes Absolute: 0.3 10*3/uL (ref 0.2–0.9)
Monocytes Relative: 31 %
Neutro Abs: 0.2 10*3/uL — ABNORMAL LOW (ref 1.4–6.5)
Neutrophils Relative %: 27 %
Platelets: 103 10*3/uL — ABNORMAL LOW (ref 150–440)
RBC: 3.22 MIL/uL — ABNORMAL LOW (ref 3.80–5.20)
RDW: 21.6 % — ABNORMAL HIGH (ref 11.5–14.5)
WBC: 0.9 10*3/uL — CL (ref 3.6–11.0)

## 2015-04-03 LAB — COMPREHENSIVE METABOLIC PANEL
ALT: 10 U/L — ABNORMAL LOW (ref 14–54)
AST: 13 U/L — ABNORMAL LOW (ref 15–41)
Albumin: 4.2 g/dL (ref 3.5–5.0)
Alkaline Phosphatase: 63 U/L (ref 38–126)
Anion gap: 9 (ref 5–15)
BUN: 16 mg/dL (ref 6–20)
CO2: 24 mmol/L (ref 22–32)
Calcium: 9.3 mg/dL (ref 8.9–10.3)
Chloride: 96 mmol/L — ABNORMAL LOW (ref 101–111)
Creatinine, Ser: 0.51 mg/dL (ref 0.44–1.00)
GFR calc Af Amer: >60 mL/min (ref >60)
GFR calc non Af Amer: >60 mL/min (ref >60)
Glucose, Bld: 222 mg/dL — ABNORMAL HIGH (ref 65–99)
Potassium: 3.7 mmol/L (ref 3.5–5.1)
Sodium: 129 mmol/L — ABNORMAL LOW (ref 135–145)
Total Bilirubin: 1.2 mg/dL (ref 0.3–1.2)
Total Protein: 7.2 g/dL (ref 6.5–8.1)

## 2015-04-03 LAB — HEMOGLOBIN A1C: Hgb A1c MFr Bld: 6 % (ref 4.0–6.0)

## 2015-04-03 LAB — MICROALBUMIN / CREATININE URINE RATIO
Creatinine,U: 128.2 mg/dL
MICROALB/CREAT RATIO: 1.2 mg/g (ref 0.0–30.0)
Microalb, Ur: 1.6 mg/dL (ref 0.0–1.9)

## 2015-04-03 NOTE — Patient Instructions (Signed)
Nice to meet you. We will add on an A1c to your prior labs. We will see what this reveals and then potentially change her diabetic medication. Please monitor your blood pressure and pulse. It is normal today. If your persistently running over 140/90 and her pulse is persistently above 100 please let us know. If you develop chest pain, shortness of breath, palpitations, hypoglycemia, or any new or change in symptoms please seek medical attention.

## 2015-04-03 NOTE — Assessment & Plan Note (Signed)
Blood sugars appear to be elevated above goal per patient report. Last A1c was 6.3. We will have them add on an A1c to her previously drawn blood. We will determine what this is. Given that she is likely going to be on intermittent steroids in this chemotherapy for several more cycles she may benefit from increasing her metformin. We will await the A1c results.

## 2015-04-03 NOTE — Assessment & Plan Note (Signed)
Sensation of heart beating fast could be related to her prior history of sinus tachycardia versus her new chemotherapy drug. EKG was reassuring today. She will continue to monitor this. If she continues to have issues with this she will need to discuss with her oncologist. She would potentially benefit from an increased dose of metoprolol while undergoing her current treatment. She is given parameters to monitor. She's given return precautions.

## 2015-04-03 NOTE — Assessment & Plan Note (Signed)
She is at goal today. She is advised on the correct manner to check her blood pressure with a wrist cuff. She will continue her current regimen. They will monitor blood pressure. She is given parameters to monitor for.

## 2015-04-03 NOTE — Telephone Encounter (Signed)
Ordered in [facility]

## 2015-04-03 NOTE — Telephone Encounter (Signed)
Pt daughter called about Dr Mike Gip is needing an order for A1c lab work. Dr stated she could get it done at the cancer center. The daughter wanted Dr Derrel Nip to know that The new chemo medication is running her blood pressure, heart rate and her sugar. Thank You!

## 2015-04-03 NOTE — Progress Notes (Signed)
Last Saturday more fatigue as day went in it got better, however still as of today does not feel 100%.

## 2015-04-03 NOTE — Progress Notes (Signed)
Patient ID: Dawn Wiley, female   DOB: Nov 13, 1934, 80 y.o.   MRN: [id number]  Tommi Rumps, MD Phone: [phone]  Dawn Wiley is a 80 y.o. female who presents today for same-day visit. She was seen at the cancer clinic today and advised that she be seen by her primary care doctor for her diabetes.  DIABETES Disease Monitoring: Blood Sugar ranges-she notes it was 202 this morning. Prior to starting her new chemotherapy medication she had been well controlled in the low 100s Polyuria/phagia/dipsia- no       Medications: Compliance- taking metformin Hypoglycemic symptoms- no Patient notes had been well controlled prior to chemotherapy, specifically the most recent change in chemotherapy medication. She is also been on intermittent steroids following chemotherapy.  HYPERTENSION Disease Monitoring Home BP Monitoring was checking with a wrist cuff and holding it flat on the table. Notes her blood pressures with a wrist cuff were 143/82 today. That is typical range that she has been in. Chest pain- no    Dyspnea- no Medications Compliance-  taking amlodipine and metoprolol Patient also notes since starting on her new chemotherapy medication 2-3 weeks ago she has had intermittent increases in heart rate. She notes on several occasions her heart rate would be in the 120s at home. This occurred a couple of times and resolved with no intervention. Has more frequently been in the upper 90s and low 100s. She notes when this occurs she can feel her heart beating, though reports it feels regular rhythm. She's not having any chest pain or trouble breathing with this. Patient notes she has a history of tachycardia in the past. Of note she is on adriamycin and cytoxan.   PMH: nonsmoker.   ROS see history of present illness  Objective  Physical Exam Filed Vitals:   04/03/15 1135  BP: 122/64  Pulse: 95  Temp: 98.2 F (36.8 C)    BP Readings from Last 3 Encounters:  04/03/15 122/64    04/03/15 144/79  03/28/15 133/68   Wt Readings from Last 3 Encounters:  04/03/15 108 lb (48.988 kg)  04/03/15 108 lb 2.2 oz (49.05 kg)  03/28/15 111 lb 5.3 oz (50.5 kg)    Physical Exam  Constitutional:  No acute distress  HENT:  Head: Normocephalic and atraumatic.  Mouth/Throat: Oropharynx is clear and moist. No oropharyngeal exudate.  Neck: Neck supple.  Cardiovascular: Normal rate, regular rhythm and normal heart sounds.   Pulmonary/Chest: Effort normal and breath sounds normal.  Lymphadenopathy:    She has no cervical adenopathy.  Neurological: She is alert. Gait normal.  Skin: Skin is warm and dry. She is not diaphoretic.   EKG: Normal sinus rhythm, occasional PAC, no ST or T-wave changes  Assessment/Plan: Please see individual problem list.  Diabetes mellitus without complication Blood sugars appear to be elevated above goal per patient report. Last A1c was 6.3. We will have them add on an A1c to her previously drawn blood. We will determine what this is. Given that she is likely going to be on intermittent steroids in this chemotherapy for several more cycles she may benefit from increasing her metformin. We will await the A1c results.  Tachycardia Sensation of heart beating fast could be related to her prior history of sinus tachycardia versus her new chemotherapy drug. EKG was reassuring today. She will continue to monitor this. If she continues to have issues with this she will need to discuss with her oncologist. She would potentially benefit from an increased dose of  metoprolol while undergoing her current treatment. She is given parameters to monitor. She's given return precautions.  Hypertension She is at goal today. She is advised on the correct manner to check her blood pressure with a wrist cuff. She will continue her current regimen. They will monitor blood pressure. She is given parameters to monitor for.    Orders Placed This Encounter  Procedures  . EKG  12-Lead   Tommi Rumps

## 2015-04-03 NOTE — Telephone Encounter (Signed)
Please advise 

## 2015-04-03 NOTE — Telephone Encounter (Signed)
I have scheduled her to see Dr.Sonnoburg patient insisited to see a provider today. Thanks

## 2015-04-03 NOTE — Progress Notes (Signed)
Pre visit review using our clinic review tool, if applicable. No additional management support is needed unless otherwise documented below in the visit note. 

## 2015-04-04 ENCOUNTER — Ambulatory Visit: Payer: PPO | Admitting: Family Medicine

## 2015-04-04 ENCOUNTER — Encounter: Payer: Self-pay | Admitting: *Deleted

## 2015-04-10 ENCOUNTER — Inpatient Hospital Stay: Payer: PPO

## 2015-04-10 ENCOUNTER — Telehealth: Payer: Self-pay

## 2015-04-10 DIAGNOSIS — C50911 Malignant neoplasm of unspecified site of right female breast: Secondary | ICD-10-CM

## 2015-04-10 DIAGNOSIS — Z5111 Encounter for antineoplastic chemotherapy: Secondary | ICD-10-CM | POA: Diagnosis not present

## 2015-04-10 LAB — CBC WITH DIFFERENTIAL/PLATELET
Basophils Absolute: 0.1 10*3/uL (ref 0–0.1)
Basophils Relative: 1 %
Eosinophils Absolute: 0 10*3/uL (ref 0–0.7)
Eosinophils Relative: 0 %
HCT: 28.9 % — ABNORMAL LOW (ref 35.0–47.0)
Hemoglobin: 9.4 g/dL — ABNORMAL LOW (ref 12.0–16.0)
Lymphocytes Relative: 13 %
Lymphs Abs: 1 10*3/uL (ref 1.0–3.6)
MCH: 30.5 pg (ref 26.0–34.0)
MCHC: 32.6 g/dL (ref 32.0–36.0)
MCV: 93.7 fL (ref 80.0–100.0)
Monocytes Absolute: 0.9 10*3/uL (ref 0.2–0.9)
Monocytes Relative: 12 %
Neutro Abs: 5.6 10*3/uL (ref 1.4–6.5)
Neutrophils Relative %: 74 %
Platelets: 344 10*3/uL (ref 150–440)
RBC: 3.08 MIL/uL — ABNORMAL LOW (ref 3.80–5.20)
RDW: 22.3 % — ABNORMAL HIGH (ref 11.5–14.5)
WBC: 7.5 10*3/uL (ref 3.6–11.0)

## 2015-04-10 LAB — COMPREHENSIVE METABOLIC PANEL
ALT: 10 U/L — ABNORMAL LOW (ref 14–54)
AST: 15 U/L (ref 15–41)
Albumin: 3.9 g/dL (ref 3.5–5.0)
Alkaline Phosphatase: 72 U/L (ref 38–126)
Anion gap: 9 (ref 5–15)
BUN: 11 mg/dL (ref 6–20)
CO2: 27 mmol/L (ref 22–32)
Calcium: 9.4 mg/dL (ref 8.9–10.3)
Chloride: 104 mmol/L (ref 101–111)
Creatinine, Ser: 0.43 mg/dL — ABNORMAL LOW (ref 0.44–1.00)
GFR calc Af Amer: >60 mL/min (ref >60)
GFR calc non Af Amer: >60 mL/min (ref >60)
Glucose, Bld: 142 mg/dL — ABNORMAL HIGH (ref 65–99)
Potassium: 3.6 mmol/L (ref 3.5–5.1)
Sodium: 140 mmol/L (ref 135–145)
Total Bilirubin: 0.5 mg/dL (ref 0.3–1.2)
Total Protein: 6.7 g/dL (ref 6.5–8.1)

## 2015-04-10 NOTE — Telephone Encounter (Signed)
Called pt per MD to tell her she is good to go for treatment tomorrow.  Pt verbalized an understanding and stated she would see [place] tomorrow.

## 2015-04-11 ENCOUNTER — Inpatient Hospital Stay (HOSPITAL_BASED_OUTPATIENT_CLINIC_OR_DEPARTMENT_OTHER): Payer: PPO | Admitting: Hematology and Oncology

## 2015-04-11 ENCOUNTER — Other Ambulatory Visit: Payer: Self-pay | Admitting: Hematology and Oncology

## 2015-04-11 ENCOUNTER — Other Ambulatory Visit: Payer: Self-pay

## 2015-04-11 ENCOUNTER — Inpatient Hospital Stay: Payer: PPO

## 2015-04-11 ENCOUNTER — Encounter: Payer: Self-pay | Admitting: Hematology and Oncology

## 2015-04-11 VITALS — BP 136/66 | HR 89 | Temp 97.6°F | Resp 18 | Ht 60.0 in | Wt 109.1 lb

## 2015-04-11 DIAGNOSIS — C7989 Secondary malignant neoplasm of other specified sites: Secondary | ICD-10-CM | POA: Diagnosis not present

## 2015-04-11 DIAGNOSIS — C50911 Malignant neoplasm of unspecified site of right female breast: Secondary | ICD-10-CM

## 2015-04-11 DIAGNOSIS — Z79899 Other long term (current) drug therapy: Secondary | ICD-10-CM

## 2015-04-11 DIAGNOSIS — M818 Other osteoporosis without current pathological fracture: Secondary | ICD-10-CM

## 2015-04-11 DIAGNOSIS — C779 Secondary and unspecified malignant neoplasm of lymph node, unspecified: Secondary | ICD-10-CM

## 2015-04-11 DIAGNOSIS — D509 Iron deficiency anemia, unspecified: Secondary | ICD-10-CM

## 2015-04-11 DIAGNOSIS — Z7982 Long term (current) use of aspirin: Secondary | ICD-10-CM

## 2015-04-11 DIAGNOSIS — Z7689 Persons encountering health services in other specified circumstances: Secondary | ICD-10-CM | POA: Diagnosis not present

## 2015-04-11 DIAGNOSIS — Z171 Estrogen receptor negative status [ER-]: Secondary | ICD-10-CM

## 2015-04-11 DIAGNOSIS — M129 Arthropathy, unspecified: Secondary | ICD-10-CM

## 2015-04-11 DIAGNOSIS — Z5111 Encounter for antineoplastic chemotherapy: Secondary | ICD-10-CM | POA: Diagnosis not present

## 2015-04-11 DIAGNOSIS — I2510 Atherosclerotic heart disease of native coronary artery without angina pectoris: Secondary | ICD-10-CM

## 2015-04-11 DIAGNOSIS — Z7984 Long term (current) use of oral hypoglycemic drugs: Secondary | ICD-10-CM

## 2015-04-11 DIAGNOSIS — E538 Deficiency of other specified B group vitamins: Secondary | ICD-10-CM

## 2015-04-11 DIAGNOSIS — Z87442 Personal history of urinary calculi: Secondary | ICD-10-CM

## 2015-04-11 DIAGNOSIS — R5383 Other fatigue: Secondary | ICD-10-CM

## 2015-04-11 DIAGNOSIS — I10 Essential (primary) hypertension: Secondary | ICD-10-CM

## 2015-04-11 DIAGNOSIS — E785 Hyperlipidemia, unspecified: Secondary | ICD-10-CM

## 2015-04-11 DIAGNOSIS — E119 Type 2 diabetes mellitus without complications: Secondary | ICD-10-CM

## 2015-04-11 MED ORDER — LIDOCAINE-PRILOCAINE 2.5-2.5 % EX CREA: 1.0000 "application " | TOPICAL_CREAM | CUTANEOUS | Status: DC | PRN

## 2015-04-11 MED ORDER — HEPARIN SOD (PORK) LOCK FLUSH 100 UNIT/ML IV SOLN
500.0000 [IU] | Freq: Once | INTRAVENOUS | Status: DC | PRN
  Filled 2015-04-11 (×2): qty 5

## 2015-04-11 MED ORDER — SODIUM CHLORIDE 0.9 % IV SOLN
Freq: Once | INTRAVENOUS | Status: AC
  Administered 2015-04-11: 10:00:00 via INTRAVENOUS
  Filled 2015-04-11: qty 5

## 2015-04-11 MED ORDER — SODIUM CHLORIDE 0.9 % IV SOLN
Freq: Once | INTRAVENOUS | Status: AC
  Administered 2015-04-11: 10:00:00 via INTRAVENOUS
  Filled 2015-04-11: qty 1000

## 2015-04-11 MED ORDER — SODIUM CHLORIDE 0.9 % IV SOLN
600.0000 mg/m2 | Freq: Once | INTRAVENOUS | Status: AC
  Administered 2015-04-11: 880 mg via INTRAVENOUS
  Filled 2015-04-11: qty 44

## 2015-04-11 MED ORDER — PALONOSETRON HCL INJECTION 0.25 MG/5ML
0.2500 mg | Freq: Once | INTRAVENOUS | Status: AC
  Administered 2015-04-11: 0.25 mg via INTRAVENOUS
  Filled 2015-04-11: qty 5

## 2015-04-11 MED ORDER — SODIUM CHLORIDE 0.9 % IJ SOLN
10.0000 mL | INTRAMUSCULAR | Status: DC | PRN
  Filled 2015-04-11: qty 10

## 2015-04-11 MED ORDER — PEGFILGRASTIM 6 MG/0.6ML ~~LOC~~ PSKT
6.0000 mg | PREFILLED_SYRINGE | Freq: Once | SUBCUTANEOUS | Status: AC
  Administered 2015-04-11: 6 mg via SUBCUTANEOUS
  Filled 2015-04-11: qty 0.6

## 2015-04-11 MED ORDER — DOXORUBICIN HCL CHEMO IV INJECTION 2 MG/ML
60.0000 mg/m2 | Freq: Once | INTRAVENOUS | Status: AC
  Administered 2015-04-11: 88 mg via INTRAVENOUS
  Filled 2015-04-11: qty 44

## 2015-04-11 NOTE — Progress Notes (Signed)
Home Clinic day:  04/11/2015  Chief Complaint: Dawn Wiley is an 80 y.o. female with stage IIIC right breast cancer who is seen for assessment prior to cycle #2 adriamycin and Cytoxan.  HPI: The patient was last seen in the medical oncology clinic on 04/03/2015.  At that time, she was seen for nadir assessment.  She was fatigued.  ANC was 200.  We discussed fever and neutropenia precautions.  She had lost weight.  Caloric intake was discussed.  We also discussed fluids and her low sodium.  Chest wall lesions appeared to be responding (smaller and softer).  During the interim, she she noted no energy on Saturday or Sunday.  She has been eating better.  Weight is back up.  Past Medical History  Diagnosis Date  . Kidney stones   . Hyperlipidemia   . Coronary artery disease     Coronary calcifications noted on CT scan  . Diabetes mellitus without complication ([place])   . Nephrolithiasis   . Hypertension   . Arthritis   . Anemia   . Tachycardia     Dr. Fletcher Anon, cardiologist  . Cancer [facility])     breast (right)  . PONV (postoperative nausea and vomiting)   . Rash     back  . Breast cancer [facility])     Past Surgical History  Procedure Laterality Date  . Temporomandibular joint surgery    . Cataract extraction      bilateral  . Breast surgery      breast biopsy X 3  . Eye surgery      bilateral cataract extraction  . Mastectomy modified radical Right 08/31/2014    Procedure: MASTECTOMY MODIFIED RADICAL;  Surgeon: Molly Maduro, MD;  Location: [facility] ORS;  Service: General;  Laterality: Right;  . Portacath placement Left 10/20/2014    Procedure: INSERTION PORT-A-CATH;  Surgeon: Marlyce Huge, MD;  Location: [facility] ORS;  Service: General;  Laterality: Left;    Family History  Problem Relation Age of Onset  . Peripheral Artery Disease Sister     carotid artery stenosis   . Heart disease Sister   . Diabetes Sister     Social  History:  reports that she has never smoked. She has never used smokeless tobacco. She reports that she does not drink alcohol or use illicit drugs.  The patient is accompanied by her daughter today.  Allergies: No Known Allergies  Current Medications: Current Outpatient Prescriptions  Medication Sig Dispense Refill  . acetaminophen (TYLENOL) 500 MG tablet Take 500 mg by mouth every 6 (six) hours as needed for mild pain or moderate pain.     [name] Kitchen amLODipine (NORVASC) 5 MG tablet take 1 tablet by mouth once daily 90 tablet 1  . aspirin 81 MG tablet Take 81 mg by mouth every other day.    . Calcium Carbonate (CALTRATE 600 PO) Take 1,200 mg by mouth daily.     . cholecalciferol (VITAMIN D) 400 UNITS TABS tablet Take 800 Units by mouth.    . Cyanocobalamin (VITAMIN B-12 IJ) Inject as directed every 30 (thirty) days.     . diphenhydrAMINE (BENADRYL) 25 mg capsule Take 25 mg by mouth every 6 (six) hours as needed for allergies.    . ferrous sulfate 325 (65 FE) MG tablet Take 325 mg by mouth daily with breakfast.     . loratadine (CLARITIN) 10 MG tablet Take 1 tablet (10 mg total) by mouth daily. 30 tablet 0  .  LORazepam (ATIVAN) 0.5 MG tablet Take 1 tablet (0.5 mg total) by mouth every 6 (six) hours as needed (Nausea or vomiting). 30 tablet 0  . metFORMIN (GLUCOPHAGE) 500 MG tablet Take 1 tablet (500 mg total) by mouth 2 (two) times daily with a meal. 180 tablet 1  . metoprolol tartrate (LOPRESSOR) 25 MG tablet Take 1 tablet (25 mg total) by mouth 2 (two) times daily. 180 tablet 2  . ondansetron (ZOFRAN) 8 MG tablet Take 1 tablet (8 mg total) by mouth 2 (two) times daily as needed. Start on the third day after chemotherapy. 30 tablet 1  . dexamethasone (DECADRON) 4 MG tablet Take 2 tablets by mouth once a day on the day after chemotherapy and then take 2 tablets two times a day for 2 days. Take with food. 30 tablet 1  . HYDROcodone-acetaminophen (NORCO/VICODIN) 5-325 MG tablet Take 1 tablet by mouth  every 6 (six) hours as needed for moderate pain. 20 tablet 0  . lidocaine-prilocaine (EMLA) cream Apply 1 application topically as needed. Apply 1-2 hours prior to chemotherapy 30 g 1  . magic mouthwash SOLN Take 5 mLs by mouth 4 (four) times daily. 200 mL 1  . oxyCODONE-acetaminophen (PERCOCET/ROXICET) 5-325 MG per tablet Take 1 tablet by mouth every 4 (four) hours as needed for severe pain. 20 tablet 0  . potassium chloride (K-DUR,KLOR-CON) 10 MEQ tablet Take 1 tablet (10 mEq total) by mouth 2 (two) times daily. For 3 days 20 tablet 1   No current facility-administered medications for this visit.   Facility-Administered Medications Ordered in Other Visits  Medication Dose Route Frequency Provider Last Rate Last Dose  . heparin lock flush 100 unit/mL  500 Units Intracatheter Once PRN [name], MD      . sodium chloride 0.9 % injection 10 mL  10 mL Intravenous PRN [name], MD      . sodium chloride 0.9 % injection 10 mL  10 mL Intravenous PRN [name], MD   10 mL at 03/21/15 0849  . sodium chloride 0.9 % injection 10 mL  10 mL Intracatheter PRN [name], MD      . sodium chloride 0.9 % injection 10 mL  10 mL Intracatheter PRN [name], MD   10 mL at 04/25/15 1941    Review of Systems:  GENERAL: Feels "ok". No fevers or sweats. Weight up 1 pound. PERFORMANCE STATUS (ECOG): 1 HEENT:No visual changes, runny nose, sore throat, mouth sores or tenderness. Lungs: No shortness of breath or cough. No hemoptysis. Cardiac: No chest pain, palpitations, orthopnea, or PND. GI: Appetite better.  No nausea, vomiting, diarrhea, constipation, melena or hematochezia. GU: No urgency, frequency, dysuria, or hematuria. Musculoskeletal: No back pain. No joint pain. No muscle tenderness. Extremities: No pain or swelling. Skin: Chest wall lesions (improved). Neuro: No headache, numbness or weakness, balance or coordination issues. Endocrine:  Diabetes.  No thyroid issues, hot flashes or night sweats. Psych: No mood changes, depression or anxiety. Pain: No focal pain. Review of systems: All other systems reviewed and found to be negative.  Physical Exam: Blood pressure 136/66, pulse 89, temperature 97.6 F (36.4 C), temperature source Tympanic, resp. rate 18, height 5' (1.524 m), weight 109 lb 2 oz (49.5 kg), SpO2 97 %. GENERAL: Thin elderly woman sitting comfortably in the exam room in no acute distress. MENTAL STATUS: Alert and oriented to person, place and time. HEAD: Short silver wig. Normocephalic, atraumatic, face symmetric, no Cushingoid features. EYES:  Blue eyes. Pupils equal round and reactive to light and accomodation. No conjunctivitis or scleral icterus. ENT: Oropharynx clear without lesion. Tongue normal. Mucous membranes moist.  RESPIRATORY: Clear to auscultation without rales, wheezes or rhonchi. CARDIOVASCULAR: Regular rate and rhythm without murmur, rub or gallop. BREAST: Right mastectomy. Breast lesions dry.  Nodularity measuring 6 x 4.5 cm medially (dry and soft).  Irregular border streaking extends 8 cm superiorly and 3 cm inferiorly.  Palpable area laterally along incision difficult to measure (flat and dry) extending 6 x 3 cm and 4 x 6 cm. ABDOMEN: Soft, non-tender, with active bowel sounds, and no hepatosplenomegaly. No masses. SKIN: Chest wall lesions as above. EXTREMITIES: No edema, no skin discoloration or tenderness. No palpable cords. LYMPH NODES: No palpable cervical, supraclavicular, axillary or inguinal adenopathy  NEUROLOGICAL: Unremarkable. PSYCH: Appropriate.   Appointment on 04/10/2015  Component Date Value Ref Range Status  . WBC 04/10/2015 7.5  3.6 - 11.0 K/uL Final  . RBC 04/10/2015 3.08* 3.80 - 5.20 MIL/uL Final  . Hemoglobin 04/10/2015 9.4* 12.0 - 16.0 g/dL Final  . HCT 04/10/2015 28.9* 35.0 - 47.0 % Final  . MCV 04/10/2015 93.7  80.0 - 100.0 fL Final  . MCH  04/10/2015 30.5  26.0 - 34.0 pg Final  . MCHC 04/10/2015 32.6  32.0 - 36.0 g/dL Final  . RDW 04/10/2015 22.3* 11.5 - 14.5 % Final  . Platelets 04/10/2015 344  150 - 440 K/uL Final  . Neutrophils Relative % 04/10/2015 74%   Final  . Neutro Abs 04/10/2015 5.6  1.4 - 6.5 K/uL Final  . Lymphocytes Relative 04/10/2015 13%   Final  . Lymphs Abs 04/10/2015 1.0  1.0 - 3.6 K/uL Final  . Monocytes Relative 04/10/2015 12%   Final  . Monocytes Absolute 04/10/2015 0.9  0.2 - 0.9 K/uL Final  . Eosinophils Relative 04/10/2015 0%   Final  . Eosinophils Absolute 04/10/2015 0.0  0 - 0.7 K/uL Final  . Basophils Relative 04/10/2015 1%   Final  . Basophils Absolute 04/10/2015 0.1  0 - 0.1 K/uL Final  . Sodium 04/10/2015 140  135 - 145 mmol/L Final  . Potassium 04/10/2015 3.6  3.5 - 5.1 mmol/L Final  . Chloride 04/10/2015 104  101 - 111 mmol/L Final  . CO2 04/10/2015 27  22 - 32 mmol/L Final  . Glucose, Bld 04/10/2015 142* 65 - 99 mg/dL Final  . BUN 04/10/2015 11  6 - 20 mg/dL Final  . Creatinine, Ser 04/10/2015 0.43* 0.44 - 1.00 mg/dL Final  . Calcium 04/10/2015 9.4  8.9 - 10.3 mg/dL Final  . Total Protein 04/10/2015 6.7  6.5 - 8.1 g/dL Final  . Albumin 04/10/2015 3.9  3.5 - 5.0 g/dL Final  . AST 04/10/2015 15  15 - 41 U/L Final  . ALT 04/10/2015 10* 14 - 54 U/L Final  . Alkaline Phosphatase 04/10/2015 72  38 - 126 U/L Final  . Total Bilirubin 04/10/2015 0.5  0.3 - 1.2 mg/dL Final  . GFR calc non Af Amer 04/10/2015 >60  >60 mL/min Final  . GFR calc Af Amer 04/10/2015 >60  >60 mL/min Final   Comment: (NOTE) The eGFR has been calculated using the CKD EPI equation. This calculation has not been validated in all clinical situations. eGFR's persistently <60 mL/min signify possible Chronic Kidney Disease.   . Anion gap 04/10/2015 9  5 - 15 Final    Assessment:  Dawn Wiley is an 80 y.o. female with stage IIIC right breast cancer  status post mastectomy and axillary lymph node dissection on  08/31/2014. 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 Carcinoma involved skeletal muscle and dermal lymphatics. Tumor was less than 0.5 mm from the deep margin. Thirteen of 15 lymph nodes were involved. The largest metastatic focus was 2 cm. Tumor is triple negative (ER negative, PR negative, and HER-2/neu negative). Pathologic stage was [id number].  PET scan on 09/21/2014 revealed postoperative changes in the right chest with no suspicious findings or residual tumor. Bone scan on 10/11/2014 revealed no evidence of metastatic disease.  Bone density study on 10/02/2014 revealed a T score of -4.7 in the forearm consistent with osteoporosis. T-score was less than 2.5 in the spine or hip. Echocardiogram on 10/02/2014 revealed ejection fraction of 60-65%.  She has iron deficiency anemia. Labs on 09/19/2014 revealed a hematocrit of 29.9, hemoglobin 8.7, MCV 69.7, ferritin 9, and TIBC 616. B12 was low (265) with a prior history of B12 deficiency and need for supplementation (stopped 06/2014). She began B12 on 10/12/2014 (last 01/18/2015).  Folate was normal. Her diet is modest. She has never had a colonoscopy. She denies any melena or hematochezia.  She continues to tolerate 1 iron pill a day.  She is eating red meat.  Hematocrit is stable to slightly improved.  Chest wall biopsy x 3 (lateral, medial, and middle sites) on 11/08/2014 revealed dermal lymphatic involvement by invasive mammary carcinoma with micropapillary features.    She has aggressive disease with growth despite several chemotherapeutic agents.  She received 5 weeks of Taxol (11/03/2014 - 12/01/2014).  She received 1 week of adriamycin (12/14/2014).  She received 1 cycle of  carboplatin (12/28/2014).  After carboplatin, disease appeared to stabilize, but with 2 weeks off of therapy, her disease grew again (medial lesion larger and weaping).   She received 3 cycle of carboplatin and  Taxol (01/18/2015 - 03/02/2015).  Chest wall lesions initially decreased then began to grow.  She is status post 1 cycle of adriamycin and Cytoxan (03/26/2015) with Neulasta support.  ANC nadir was 200 on day 9 (04/03/2015).  Chest wall lesions appear stable.   Symptomatically, she is doing well.  Weight has improved. Sodium is normal.  Plan: 1.  Review labs from yesterday. 2.  Cycle #2 AC today.  3.  On-Pro Neulasta. 4.  Refill EMLA cream. 5.  RTC in 2 weeks for MD assessment, labs (CBC with diff, CMP, Mg), and cycle #3 AC.   [name], MD  04/11/2015 , 9:07 AM

## 2015-04-11 NOTE — Progress Notes (Signed)
Patient here today for follow up Breast Cancer and possible treatment. States she had fatigue and decreased appetite for 2 days post treatment. Denies nausea or vomiting. States she feels better today. No complaints voiced.

## 2015-04-25 ENCOUNTER — Inpatient Hospital Stay: Payer: PPO

## 2015-04-25 ENCOUNTER — Other Ambulatory Visit: Payer: Self-pay

## 2015-04-25 ENCOUNTER — Inpatient Hospital Stay (HOSPITAL_BASED_OUTPATIENT_CLINIC_OR_DEPARTMENT_OTHER): Payer: PPO | Admitting: Hematology and Oncology

## 2015-04-25 VITALS — BP 131/71 | HR 86 | Temp 95.9°F | Resp 18 | Ht 60.0 in | Wt 106.7 lb

## 2015-04-25 DIAGNOSIS — Z79899 Other long term (current) drug therapy: Secondary | ICD-10-CM

## 2015-04-25 DIAGNOSIS — C7989 Secondary malignant neoplasm of other specified sites: Secondary | ICD-10-CM | POA: Diagnosis not present

## 2015-04-25 DIAGNOSIS — D649 Anemia, unspecified: Secondary | ICD-10-CM

## 2015-04-25 DIAGNOSIS — Z87442 Personal history of urinary calculi: Secondary | ICD-10-CM

## 2015-04-25 DIAGNOSIS — Z7982 Long term (current) use of aspirin: Secondary | ICD-10-CM

## 2015-04-25 DIAGNOSIS — M818 Other osteoporosis without current pathological fracture: Secondary | ICD-10-CM

## 2015-04-25 DIAGNOSIS — C50911 Malignant neoplasm of unspecified site of right female breast: Secondary | ICD-10-CM

## 2015-04-25 DIAGNOSIS — I2510 Atherosclerotic heart disease of native coronary artery without angina pectoris: Secondary | ICD-10-CM

## 2015-04-25 DIAGNOSIS — E119 Type 2 diabetes mellitus without complications: Secondary | ICD-10-CM

## 2015-04-25 DIAGNOSIS — Z171 Estrogen receptor negative status [ER-]: Secondary | ICD-10-CM | POA: Diagnosis not present

## 2015-04-25 DIAGNOSIS — K121 Other forms of stomatitis: Secondary | ICD-10-CM

## 2015-04-25 DIAGNOSIS — R21 Rash and other nonspecific skin eruption: Secondary | ICD-10-CM

## 2015-04-25 DIAGNOSIS — K120 Recurrent oral aphthae: Secondary | ICD-10-CM

## 2015-04-25 DIAGNOSIS — E538 Deficiency of other specified B group vitamins: Secondary | ICD-10-CM

## 2015-04-25 DIAGNOSIS — I10 Essential (primary) hypertension: Secondary | ICD-10-CM

## 2015-04-25 DIAGNOSIS — E785 Hyperlipidemia, unspecified: Secondary | ICD-10-CM

## 2015-04-25 DIAGNOSIS — C779 Secondary and unspecified malignant neoplasm of lymph node, unspecified: Secondary | ICD-10-CM | POA: Diagnosis not present

## 2015-04-25 DIAGNOSIS — R5383 Other fatigue: Secondary | ICD-10-CM

## 2015-04-25 DIAGNOSIS — Z7984 Long term (current) use of oral hypoglycemic drugs: Secondary | ICD-10-CM

## 2015-04-25 DIAGNOSIS — M129 Arthropathy, unspecified: Secondary | ICD-10-CM

## 2015-04-25 DIAGNOSIS — R634 Abnormal weight loss: Secondary | ICD-10-CM

## 2015-04-25 DIAGNOSIS — R000 Tachycardia, unspecified: Secondary | ICD-10-CM

## 2015-04-25 DIAGNOSIS — Z5111 Encounter for antineoplastic chemotherapy: Secondary | ICD-10-CM | POA: Diagnosis not present

## 2015-04-25 DIAGNOSIS — D509 Iron deficiency anemia, unspecified: Secondary | ICD-10-CM

## 2015-04-25 DIAGNOSIS — E876 Hypokalemia: Secondary | ICD-10-CM

## 2015-04-25 LAB — COMPREHENSIVE METABOLIC PANEL
ALT: 9 U/L — ABNORMAL LOW (ref 14–54)
AST: 16 U/L (ref 15–41)
Albumin: 3.7 g/dL (ref 3.5–5.0)
Alkaline Phosphatase: 65 U/L (ref 38–126)
Anion gap: 8 (ref 5–15)
BUN: 13 mg/dL (ref 6–20)
CO2: 26 mmol/L (ref 22–32)
Calcium: 8.9 mg/dL (ref 8.9–10.3)
Chloride: 102 mmol/L (ref 101–111)
Creatinine, Ser: 0.41 mg/dL — ABNORMAL LOW (ref 0.44–1.00)
GFR calc Af Amer: >60 mL/min (ref >60)
GFR calc non Af Amer: >60 mL/min (ref >60)
Glucose, Bld: 183 mg/dL — ABNORMAL HIGH (ref 65–99)
Potassium: 2.9 mmol/L — CL (ref 3.5–5.1)
Sodium: 136 mmol/L (ref 135–145)
Total Bilirubin: 0.3 mg/dL (ref 0.3–1.2)
Total Protein: 6.5 g/dL (ref 6.5–8.1)

## 2015-04-25 LAB — CBC WITH DIFFERENTIAL/PLATELET
Basophils Absolute: 0.1 10*3/uL (ref 0–0.1)
Basophils Relative: 1 %
Eosinophils Absolute: 0 10*3/uL (ref 0–0.7)
Eosinophils Relative: 0 %
HCT: 26.3 % — ABNORMAL LOW (ref 35.0–47.0)
Hemoglobin: 8.7 g/dL — ABNORMAL LOW (ref 12.0–16.0)
Lymphocytes Relative: 5 %
Lymphs Abs: 0.5 10*3/uL — ABNORMAL LOW (ref 1.0–3.6)
MCH: 31.3 pg (ref 26.0–34.0)
MCHC: 33 g/dL (ref 32.0–36.0)
MCV: 94.7 fL (ref 80.0–100.0)
Monocytes Absolute: 0.9 10*3/uL (ref 0.2–0.9)
Monocytes Relative: 9 %
Neutro Abs: 8.2 10*3/uL — ABNORMAL HIGH (ref 1.4–6.5)
Neutrophils Relative %: 85 %
Platelets: 183 10*3/uL (ref 150–440)
RBC: 2.77 MIL/uL — ABNORMAL LOW (ref 3.80–5.20)
RDW: 22.1 % — ABNORMAL HIGH (ref 11.5–14.5)
WBC: 9.6 10*3/uL (ref 3.6–11.0)

## 2015-04-25 LAB — MAGNESIUM: Magnesium: 1.7 mg/dL (ref 1.7–2.4)

## 2015-04-25 LAB — FERRITIN: Ferritin: 116 ng/mL (ref 11–307)

## 2015-04-25 MED ORDER — SODIUM CHLORIDE 0.9 % IV SOLN
Freq: Once | INTRAVENOUS | Status: AC
  Administered 2015-04-25: 11:00:00 via INTRAVENOUS
  Filled 2015-04-25: qty 1000

## 2015-04-25 MED ORDER — CYANOCOBALAMIN 1000 MCG/ML IJ SOLN
1000.0000 ug | Freq: Once | INTRAMUSCULAR | Status: AC
  Administered 2015-04-25: 1000 ug via INTRAMUSCULAR
  Filled 2015-04-25: qty 1

## 2015-04-25 MED ORDER — HEPARIN SOD (PORK) LOCK FLUSH 100 UNIT/ML IV SOLN
500.0000 [IU] | Freq: Once | INTRAVENOUS | Status: AC
  Administered 2015-04-25: 500 [IU] via INTRAVENOUS

## 2015-04-25 MED ORDER — POTASSIUM CHLORIDE 20 MEQ/100ML IV SOLN
20.0000 meq | Freq: Once | INTRAVENOUS | Status: AC
  Administered 2015-04-25: 20 meq via INTRAVENOUS
  Filled 2015-04-25: qty 100

## 2015-04-25 MED ORDER — SODIUM CHLORIDE 0.9 % IJ SOLN
10.0000 mL | INTRAMUSCULAR | Status: AC | PRN
  Administered 2015-04-25: 10 mL
  Filled 2015-04-25: qty 10

## 2015-04-25 MED ORDER — MAGIC MOUTHWASH: 5.0000 mL | Freq: Four times a day (QID) | ORAL | Status: DC

## 2015-04-25 MED ORDER — POTASSIUM CHLORIDE CRYS ER 10 MEQ PO TBCR: 10.0000 meq | EXTENDED_RELEASE_TABLET | Freq: Two times a day (BID) | ORAL | Status: DC

## 2015-04-25 NOTE — Progress Notes (Signed)
[facility] day:  04/25/2015   Chief Complaint: Dawn Wiley is an 81 y.o. female with stage IIIC right breast cancer who is seen for assessment prior to cycle #3 adriamycin and Cytoxan.  HPI: The patient was last seen in the medical oncology clinic on 04/11/2015.  At that time, she received cycle #2 Adriamycin and Cytoxan with Neulasta support.  She states that 2 days after her first cycle of chemotherapy she had no energy.  After her second cycle of chemotherapy, she only had 2 or 3 brief episodes of fatigue and not feeling well.  She denied any nausea, vomiting, or diarrhea.  She has not been eating as well because "food doesn't taste good" (all tastes the same).  She has also had pain associated with ulcers in her mouth which popped up last week.  She has used baking soda with water.  Regarding her chest wall lesions, she feels that there may be a spot that is growing.  She denies any other symptoms.  Past Medical History  Diagnosis Date  . Kidney stones   . Hyperlipidemia   . Coronary artery disease     Coronary calcifications noted on CT scan  . Diabetes mellitus without complication ([place])   . Nephrolithiasis   . Hypertension   . Arthritis   . Anemia   . Tachycardia     Dr. Fletcher Anon, cardiologist  . Cancer [facility])     breast (right)  . PONV (postoperative nausea and vomiting)   . Rash     back  . Breast cancer [facility])     Past Surgical History  Procedure Laterality Date  . Temporomandibular joint surgery    . Cataract extraction      bilateral  . Breast surgery      breast biopsy X 3  . Eye surgery      bilateral cataract extraction  . Mastectomy modified radical Right 08/31/2014    Procedure: MASTECTOMY MODIFIED RADICAL;  Surgeon: Molly Maduro, MD;  Location: [facility] ORS;  Service: General;  Laterality: Right;  . Portacath placement Left 10/20/2014    Procedure: INSERTION PORT-A-CATH;  Surgeon: Marlyce Huge, MD;   Location: [facility] ORS;  Service: General;  Laterality: Left;    Family History  Problem Relation Age of Onset  . Peripheral Artery Disease Sister     carotid artery stenosis   . Heart disease Sister   . Diabetes Sister     Social History:  reports that she has never smoked. She has never used smokeless tobacco. She reports that she does not drink alcohol or use illicit drugs.  The patient is accompanied by her daughter today.  Her phone number is [phone].  Allergies: No Known Allergies  Current Medications: Current Outpatient Prescriptions  Medication Sig Dispense Refill  . acetaminophen (TYLENOL) 500 MG tablet Take 500 mg by mouth every 6 (six) hours as needed for mild pain or moderate pain.     [name] Kitchen amLODipine (NORVASC) 5 MG tablet take 1 tablet by mouth once daily 90 tablet 1  . aspirin 81 MG tablet Take 81 mg by mouth every other day.    . Calcium Carbonate (CALTRATE 600 PO) Take 1,200 mg by mouth daily.     . cholecalciferol (VITAMIN D) 400 UNITS TABS tablet Take 800 Units by mouth.    . Cyanocobalamin (VITAMIN B-12 IJ) Inject as directed every 30 (thirty) days.     [name] Kitchen dexamethasone (DECADRON) 4 MG tablet Take 2  tablets by mouth once a day on the day after chemotherapy and then take 2 tablets two times a day for 2 days. Take with food. 30 tablet 1  . diphenhydrAMINE (BENADRYL) 25 mg capsule Take 25 mg by mouth every 6 (six) hours as needed for allergies.    . ferrous sulfate 325 (65 FE) MG tablet Take 325 mg by mouth daily with breakfast.     . HYDROcodone-acetaminophen (NORCO/VICODIN) 5-325 MG tablet Take 1 tablet by mouth every 6 (six) hours as needed for moderate pain. 20 tablet 0  . lidocaine-prilocaine (EMLA) cream Apply 1 application topically as needed. Apply 1-2 hours prior to chemotherapy 30 g 1  . loratadine (CLARITIN) 10 MG tablet Take 1 tablet (10 mg total) by mouth daily. 30 tablet 0  . LORazepam (ATIVAN) 0.5 MG tablet Take 1 tablet (0.5 mg total) by mouth every 6 (six)  hours as needed (Nausea or vomiting). 30 tablet 0  . metFORMIN (GLUCOPHAGE) 500 MG tablet Take 1 tablet (500 mg total) by mouth 2 (two) times daily with a meal. 180 tablet 1  . metoprolol tartrate (LOPRESSOR) 25 MG tablet Take 1 tablet (25 mg total) by mouth 2 (two) times daily. 180 tablet 2  . ondansetron (ZOFRAN) 8 MG tablet Take 1 tablet (8 mg total) by mouth 2 (two) times daily as needed. Start on the third day after chemotherapy. 30 tablet 1  . oxyCODONE-acetaminophen (PERCOCET/ROXICET) 5-325 MG per tablet Take 1 tablet by mouth every 4 (four) hours as needed for severe pain. 20 tablet 0  . potassium chloride SA (K-DUR,KLOR-CON) 10 MEQ tablet Take 1 tablet (10 mEq total) by mouth 2 (two) times daily. For 3 days 10 tablet 1   No current facility-administered medications for this visit.   Facility-Administered Medications Ordered in Other Visits  Medication Dose Route Frequency Provider Last Rate Last Dose  . heparin lock flush 100 unit/mL  500 Units Intracatheter Once PRN [name], MD      . sodium chloride 0.9 % injection 10 mL  10 mL Intravenous PRN [name], MD      . sodium chloride 0.9 % injection 10 mL  10 mL Intravenous PRN [name], MD   10 mL at 03/21/15 0849  . sodium chloride 0.9 % injection 10 mL  10 mL Intracatheter PRN [name], MD      . sodium chloride 0.9 % injection 10 mL  10 mL Intracatheter PRN [name], MD   10 mL at 04/25/15 1975    Review of Systems:  GENERAL: Feels "ok". No fevers or sweats. Weight down 3 pounds in 2 weeks. PERFORMANCE STATUS (ECOG): 1 HEENT: Oral ulcers x 1 week.  No visual changes, runny nose, sore throat, or tenderness. Lungs: No shortness of breath or cough. No hemoptysis. Cardiac: No chest pain, palpitations, orthopnea, or PND. GI: No nausea, vomiting, diarrhea, constipation, melena or hematochezia. GU: No urgency, frequency, dysuria, or hematuria. Musculoskeletal: No back pain. No  joint pain. No muscle tenderness. Extremities: No pain or swelling. Skin: Chest wall lesions, stable (? one spot growing). Neuro: No headache, numbness or weakness, balance or coordination issues. Endocrine: No diabetes, thyroid issues, hot flashes or night sweats. Psych: No mood changes, depression or anxiety. Pain: No focal pain. Review of systems: All other systems reviewed and found to be negative.  Physical Exam: Blood pressure 131/71, pulse 86, temperature 95.9 F (35.5 C), temperature source Tympanic, resp. rate 18, height 5' (1.524 m).  GENERAL: Thin elderly woman sitting comfortably in the exam room in no acute distress. MENTAL STATUS: Alert and oriented to person, place and time. HEAD: Short silver wig. Normocephalic, atraumatic, face symmetric, no Cushingoid features. EYES: Blue eyes. Pupils equal round and reactive to light and accomodation. No conjunctivitis or scleral icterus. ENT: Two small aphthous ulcers with rim of erythema on hard palate.  One lesion on end of tongue.  No vesicles.  Mucous membranes moist.  RESPIRATORY: Clear to auscultation without rales, wheezes or rhonchi. CARDIOVASCULAR: Regular rate and rhythm without murmur, rub or gallop. BREAST: Right mastectomy. Breast lesions dry.  Nodular lesion measuring 5 x 4 cm medially.  Thin halo of patchy erythema extending medially 3 cm.  Several confluent palpable ruddy lesions extending laterally under axillae difficult to measure (4 x 5 cm, 3 cm, 1.5 cm) Skin changes extending 8.5 cm superiorly from mastectomy incision.  Medial to lateral skin changes approximately 21 cm. ABDOMEN: Soft, non-tender, with active bowel sounds, and no hepatosplenomegaly. No masses. SKIN: Chest wall lesions as above. EXTREMITIES: No edema, no skin discoloration or tenderness. No palpable cords. LYMPH NODES: No palpable cervical, supraclavicular, axillary or inguinal adenopathy  NEUROLOGICAL: Unremarkable. PSYCH:  Appropriate.   Clinical Support on 04/25/2015  Component Date Value Ref Range Status  . Sodium 04/25/2015 136  135 - 145 mmol/L Final  . Potassium 04/25/2015 2.9* 3.5 - 5.1 mmol/L Final   CRITICAL RESULT CALLED TO, READ BACK BY AND VERIFIED WITH KAREN JONES@[id number]  CHC  . Chloride 04/25/2015 102  101 - 111 mmol/L Final  . CO2 04/25/2015 26  22 - 32 mmol/L Final  . Glucose, Bld 04/25/2015 183* 65 - 99 mg/dL Final  . BUN 04/25/2015 13  6 - 20 mg/dL Final  . Creatinine, Ser 04/25/2015 0.41* 0.44 - 1.00 mg/dL Final  . Calcium 04/25/2015 8.9  8.9 - 10.3 mg/dL Final  . Total Protein 04/25/2015 6.5  6.5 - 8.1 g/dL Final  . Albumin 04/25/2015 3.7  3.5 - 5.0 g/dL Final  . AST 04/25/2015 16  15 - 41 U/L Final  . ALT 04/25/2015 9* 14 - 54 U/L Final  . Alkaline Phosphatase 04/25/2015 65  38 - 126 U/L Final  . Total Bilirubin 04/25/2015 0.3  0.3 - 1.2 mg/dL Final  . GFR calc non Af Amer 04/25/2015 >60  >60 mL/min Final  . GFR calc Af Amer 04/25/2015 >60  >60 mL/min Final   Comment: (NOTE) The eGFR has been calculated using the CKD EPI equation. This calculation has not been validated in all clinical situations. eGFR's persistently <60 mL/min signify possible Chronic Kidney Disease.   . Anion gap 04/25/2015 8  5 - 15 Final  . Magnesium 04/25/2015 1.7  1.7 - 2.4 mg/dL Final    Assessment:  SHARELL HILMER is an 80 y.o. female with stage IIIC right breast cancer status post mastectomy and axillary lymph node dissection on 08/31/2014. 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 Carcinoma involved skeletal muscle and dermal lymphatics. Tumor was less than 0.5 mm from the deep margin. Thirteen of 15 lymph nodes were involved. The largest metastatic focus was 2 cm. Tumor is triple negative (ER negative, PR negative, and HER-2/neu negative). Pathologic stage was [id number].  PET scan on 09/21/2014 revealed postoperative changes in the right chest  with no suspicious findings or residual tumor. Bone scan on 10/11/2014 revealed no evidence of metastatic disease.  Bone density study on 10/02/2014 revealed a T score of -4.7  in the forearm consistent with osteoporosis. T-score was less than 2.5 in the spine or hip. Echocardiogram on 10/02/2014 revealed ejection fraction of 60-65%.  She has iron deficiency anemia. Labs on 09/19/2014 revealed a hematocrit of 29.9, hemoglobin 8.7, MCV 69.7, ferritin 9, and TIBC 616. B12 was low (265) with a prior history of B12 deficiency and need for supplementation (stopped 06/2014). She began B12 on 10/12/2014 (last 01/18/2015).  Folate was normal. Her diet is modest. She has never had a colonoscopy. She denies any melena or hematochezia.  She takes 1 iron pill a day (additional iron causes diarrhea).    Chest wall biopsy x 3 (lateral, medial, and middle sites) on 11/08/2014 revealed dermal lymphatic involvement by invasive mammary carcinoma with micropapillary features.    She has aggressive disease with growth despite several chemotherapeutic agents.  She received 5 weeks of Taxol (11/03/2014 - 12/01/2014).  She received 1 week of adriamycin (12/14/2014).  She received 1 cycle of  carboplatin (12/28/2014).  After carboplatin, disease appeared to stabilize, but with 2 weeks off of therapy, her disease grew again (medial lesion larger and weaping).   She received 3 cycle of carboplatin and Taxol (01/18/2015 - 03/02/2015).  Chest wall lesions initially decreased then began to grow.  She is status post 2 cycle of adriamycin and Cytoxan (03/26/2015 - 04/11/2015) with Neulasta support.  ANC nadir was 200 on day 9 (04/03/2015).  Cycle #2 was complicated by mouth sores, decreased oral intake, and weight loss.    Symptomatically, her mouth is tender.  Chest wall lesions appear fairly stable, with small areas of possible progression.  She has hypokalemia.  Her hematocrit has drifted down.  Plan: 1.  Labs today:  CBC  with diff, CMP, magnesium, ferritin. 2.  Discuss postponing cycle #3 AC to allow mouth to heal. 3.  Review of imaging from prior cycle with current images.  Most lesions stable, possible minor progression.  Discuss plan for only 4 cycles of AC if continued benefit.  Discuss assessing for disease outside of chest wall.  PET scan discussed.  Given rapid growth of tumor in the past, will try to expedite scan with ongoing treatment planned for next week. 4.  Rx:  Magic Mouthwash 5 cc swish and spit 4 times a day. 5.  Discuss importance of caloric intake. 6.  Potassium 20 meq IV today. 7.  Rx:  Potassium chloride 20 meq po q day x 3 days. 8.  Schedule PET scan: restaging breast cancer. 9.  Monthly B12 today. 10.  Call patient's daughter with ferritin results and possible Venofer tomorrow. 87.  Contact patient's daughter with imaging results when available. 12.  RTC in 1 week for MD assessment, labs (CBC with diff, CMP, Mg, CA27.29), review of imaging, and +/- cycle #3 AC.   [name], MD  04/25/2015 , 9:21 AM

## 2015-04-26 ENCOUNTER — Other Ambulatory Visit: Payer: Self-pay | Admitting: Nurse Practitioner

## 2015-04-26 ENCOUNTER — Encounter
Admission: RE | Admit: 2015-04-26 | Discharge: 2015-04-26 | Disposition: A | Discharge status: Home / Self Care | Payer: PPO | Source: Ambulatory Visit | Attending: Hematology and Oncology | Admitting: Hematology and Oncology

## 2015-04-26 ENCOUNTER — Inpatient Hospital Stay: Payer: PPO

## 2015-04-26 ENCOUNTER — Ambulatory Visit: Payer: PPO

## 2015-04-26 ENCOUNTER — Encounter: Payer: Self-pay | Admitting: Hematology and Oncology

## 2015-04-26 DIAGNOSIS — D649 Anemia, unspecified: Secondary | ICD-10-CM | POA: Diagnosis not present

## 2015-04-26 LAB — GLUCOSE, CAPILLARY: Glucose-Capillary: 114 mg/dL — ABNORMAL HIGH (ref 65–99)

## 2015-04-26 MED ORDER — FLUDEOXYGLUCOSE F - 18 (FDG) INJECTION
12.7700 | Freq: Once | INTRAVENOUS | Status: AC | PRN
  Administered 2015-04-26: 12.77 via INTRAVENOUS

## 2015-04-30 ENCOUNTER — Encounter: Payer: Self-pay | Admitting: Hematology and Oncology

## 2015-04-30 NOTE — Progress Notes (Signed)
[facility] day:  04/03/2015  Chief Complaint: Dawn Wiley is an 81 y.o. female with stage IIIC right breast cancer who is seen for nadir assessment on day 9 status post cycle #1 adriamycin and Cytoxan.  HPI: The patient was last seen in the medical oncology clinic on 03/28/2015.  At that time, she was seen for sick call visit.  She had experience pain in her chest wall post chemotherapy.  Exam revealed disease which had improved.  She was prescribed Lortab for pain prn.  During the interim, she notes that on Saturday the lesions on her chest wall became more dry. She notes feeling fatigued without energy. She has been eating baked chicken and vegetables.  She is drinking half a Boost/ Glucerna. She has laid around. On Sunday, she was "not up to par". She denies any nausea, although notes a gnawing sensation in her stomach, like it was empty.  Past Medical History  Diagnosis Date  . Kidney stones   . Hyperlipidemia   . Coronary artery disease     Coronary calcifications noted on CT scan  . Diabetes mellitus without complication ([place])   . Nephrolithiasis   . Hypertension   . Arthritis   . Anemia   . Tachycardia     Dr. Fletcher Anon, cardiologist  . Cancer [facility])     breast (right)  . PONV (postoperative nausea and vomiting)   . Rash     back  . Breast cancer [facility])     Past Surgical History  Procedure Laterality Date  . Temporomandibular joint surgery    . Cataract extraction      bilateral  . Breast surgery      breast biopsy X 3  . Eye surgery      bilateral cataract extraction  . Mastectomy modified radical Right 08/31/2014    Procedure: MASTECTOMY MODIFIED RADICAL;  Surgeon: Molly Maduro, MD;  Location: [facility] ORS;  Service: General;  Laterality: Right;  . Portacath placement Left 10/20/2014    Procedure: INSERTION PORT-A-CATH;  Surgeon: Marlyce Huge, MD;  Location: [facility] ORS;  Service: General;  Laterality: Left;     Family History  Problem Relation Age of Onset  . Peripheral Artery Disease Sister     carotid artery stenosis   . Heart disease Sister   . Diabetes Sister     Social History:  reports that she has never smoked. She has never used smokeless tobacco. She reports that she does not drink alcohol or use illicit drugs.  The patient is accompanied by her daughter today.  Allergies: No Known Allergies  Current Medications: Current Outpatient Prescriptions  Medication Sig Dispense Refill  . acetaminophen (TYLENOL) 500 MG tablet Take 500 mg by mouth every 6 (six) hours as needed for mild pain or moderate pain.     [name] Kitchen amLODipine (NORVASC) 5 MG tablet take 1 tablet by mouth once daily 90 tablet 1  . aspirin 81 MG tablet Take 81 mg by mouth every other day.    . Calcium Carbonate (CALTRATE 600 PO) Take 1,200 mg by mouth daily.     . cholecalciferol (VITAMIN D) 400 UNITS TABS tablet Take 800 Units by mouth.    . Cyanocobalamin (VITAMIN B-12 IJ) Inject as directed every 30 (thirty) days.     [name] Kitchen dexamethasone (DECADRON) 4 MG tablet Take 2 tablets by mouth once a day on the day after chemotherapy and then take 2 tablets two times a day for 2  days. Take with food. 30 tablet 1  . diphenhydrAMINE (BENADRYL) 25 mg capsule Take 25 mg by mouth every 6 (six) hours as needed for allergies.    . ferrous sulfate 325 (65 FE) MG tablet Take 325 mg by mouth daily with breakfast.     . HYDROcodone-acetaminophen (NORCO/VICODIN) 5-325 MG tablet Take 1 tablet by mouth every 6 (six) hours as needed for moderate pain. 20 tablet 0  . loratadine (CLARITIN) 10 MG tablet Take 1 tablet (10 mg total) by mouth daily. 30 tablet 0  . LORazepam (ATIVAN) 0.5 MG tablet Take 1 tablet (0.5 mg total) by mouth every 6 (six) hours as needed (Nausea or vomiting). 30 tablet 0  . metFORMIN (GLUCOPHAGE) 500 MG tablet Take 1 tablet (500 mg total) by mouth 2 (two) times daily with a meal. 180 tablet 1  . metoprolol tartrate (LOPRESSOR) 25 MG  tablet Take 1 tablet (25 mg total) by mouth 2 (two) times daily. 180 tablet 2  . ondansetron (ZOFRAN) 8 MG tablet Take 1 tablet (8 mg total) by mouth 2 (two) times daily as needed. Start on the third day after chemotherapy. 30 tablet 1  . oxyCODONE-acetaminophen (PERCOCET/ROXICET) 5-325 MG per tablet Take 1 tablet by mouth every 4 (four) hours as needed for severe pain. 20 tablet 0  . lidocaine-prilocaine (EMLA) cream Apply 1 application topically as needed. Apply 1-2 hours prior to chemotherapy 30 g 1  . magic mouthwash SOLN Take 5 mLs by mouth 4 (four) times daily. 200 mL 1  . potassium chloride (K-DUR,KLOR-CON) 10 MEQ tablet Take 1 tablet (10 mEq total) by mouth 2 (two) times daily. For 3 days 20 tablet 1   No current facility-administered medications for this visit.   Facility-Administered Medications Ordered in Other Visits  Medication Dose Route Frequency Provider Last Rate Last Dose  . heparin lock flush 100 unit/mL  500 Units Intracatheter Once PRN [name], MD      . sodium chloride 0.9 % injection 10 mL  10 mL Intravenous PRN [name], MD      . sodium chloride 0.9 % injection 10 mL  10 mL Intravenous PRN [name], MD   10 mL at 03/21/15 0849  . sodium chloride 0.9 % injection 10 mL  10 mL Intracatheter PRN [name], MD      . sodium chloride 0.9 % injection 10 mL  10 mL Intracatheter PRN [name], MD   10 mL at 04/25/15 5883    Review of Systems:  GENERAL: Energy level down. No fevers or sweats. Weight down 3 pounds. PERFORMANCE STATUS (ECOG): 1 HEENT:No visual changes, runny nose, sore throat, mouth sores or tenderness. Lungs: No shortness of breath or cough. No hemoptysis. Cardiac: No chest pain, palpitations, orthopnea, or PND. GI: Decreased appetite.  Gnawing sensation in stomach.  No nausea, vomiting, diarrhea, constipation, melena or hematochezia. GU: No urgency, frequency, dysuria, or hematuria. Musculoskeletal:  No back pain. No joint pain. No muscle tenderness. Extremities: No pain or swelling. Skin: Chest wall lesions, changing (see HPI). Neuro: No headache, numbness or weakness, balance or coordination issues. Endocrine: Diabetes.  No thyroid issues, hot flashes or night sweats. Psych: No mood changes, depression or anxiety. Pain: No focal pain. Review of systems: All other systems reviewed and found to be negative.  Physical Exam: Blood pressure 144/79, pulse 105, temperature 95.7 F (35.4 C), temperature source Tympanic, height 5' (1.524 m), weight 108 lb 2.2 oz (49.05 kg). GENERAL: Thin  elderly woman sitting comfortably in a private room in the infusion area in no acute distress. MENTAL STATUS: Alert and oriented to person, place and time. HEAD: Short silver wig. Normocephalic, atraumatic, face symmetric, no Cushingoid features. EYES: Blue eyes. Pupils equal round and reactive to light and accomodation. No conjunctivitis or scleral icterus. ENT: Oropharynx clear without lesion. Tongue normal. Mucous membranes moist.  RESPIRATORY: Clear to auscultation without rales, wheezes or rhonchi. CARDIOVASCULAR: Regular rate and rhythm without murmur, rub or gallop. BREAST: Right mastectomy. Breast lesions dry.  Nodularity measuring 5.5 x 4.5 cm medially (soft). Medial lesion has a halo of erythema extending 4 cm medially and 0.5 cm inferiorly. Palpable clustered areas laterally along incision 3 cm, 2.5 cm (flatter laterally). Disease extends 24-25 cm medial to lateral along incision into axillae. ABDOMEN: Soft, non-tender, with active bowel sounds, and no hepatosplenomegaly. No masses. SKIN: Chest wall lesions as above. EXTREMITIES: No edema, no skin discoloration or tenderness. No palpable cords. LYMPH NODES: No palpable cervical, supraclavicular, axillary or inguinal adenopathy  NEUROLOGICAL: Unremarkable. PSYCH: Appropriate.   Office Visit on 04/03/2015  Component  Date Value Ref Range Status  . Microalb, Ur 04/03/2015 1.6  0.0 - 1.9 mg/dL Final  . Creatinine,U 04/03/2015 128.2   Final  . Microalb Creat Ratio 04/03/2015 1.2  0.0 - 30.0 mg/g Final  Appointment on 04/03/2015  Component Date Value Ref Range Status  . WBC 04/03/2015 0.9* 3.6 - 11.0 K/uL Final   Comment: RESULT REPEATED AND VERIFIED [facility]ER CENTER CRITICAL VALUE PROTOCOL   . RBC 04/03/2015 3.22* 3.80 - 5.20 MIL/uL Final  . Hemoglobin 04/03/2015 9.7* 12.0 - 16.0 g/dL Final  . HCT 04/03/2015 29.3* 35.0 - 47.0 % Final  . MCV 04/03/2015 90.8  80.0 - 100.0 fL Final  . MCH 04/03/2015 30.0  26.0 - 34.0 pg Final  . MCHC 04/03/2015 33.0  32.0 - 36.0 g/dL Final  . RDW 04/03/2015 21.6* 11.5 - 14.5 % Final  . Platelets 04/03/2015 103* 150 - 440 K/uL Final  . Neutrophils Relative % 04/03/2015 27%   Final  . Neutro Abs 04/03/2015 0.2* 1.4 - 6.5 K/uL Final   Comment: RESULT REPEATED AND VERIFIED CRITICAL RESULT CALLED TO, READ BACK BY AND VERIFIED WITH: Velna Hatchet MOYA AT 5056 04/03/2015 KMR   . Lymphocytes Relative 04/03/2015 38%   Final  . Lymphs Abs 04/03/2015 0.3* 1.0 - 3.6 K/uL Final  . Monocytes Relative 04/03/2015 31%   Final  . Monocytes Absolute 04/03/2015 0.3  0.2 - 0.9 K/uL Final  . Eosinophils Relative 04/03/2015 2%   Final  . Eosinophils Absolute 04/03/2015 0.0  0 - 0.7 K/uL Final  . Basophils Relative 04/03/2015 2%   Final  . Basophils Absolute 04/03/2015 0.0  0 - 0.1 K/uL Final  . Sodium 04/03/2015 129* 135 - 145 mmol/L Final  . Potassium 04/03/2015 3.7  3.5 - 5.1 mmol/L Final  . Chloride 04/03/2015 96* 101 - 111 mmol/L Final  . CO2 04/03/2015 24  22 - 32 mmol/L Final  . Glucose, Bld 04/03/2015 222* 65 - 99 mg/dL Final  . BUN 04/03/2015 16  6 - 20 mg/dL Final  . Creatinine, Ser 04/03/2015 0.51  0.44 - 1.00 mg/dL Final  . Calcium 04/03/2015 9.3  8.9 - 10.3 mg/dL Final  . Total Protein 04/03/2015 7.2  6.5 - 8.1 g/dL Final  . Albumin 04/03/2015 4.2  3.5 - 5.0 g/dL Final  . AST  04/03/2015 13* 15 - 41 U/L Final  . ALT 04/03/2015 10* 14 -  54 U/L Final  . Alkaline Phosphatase 04/03/2015 63  38 - 126 U/L Final  . Total Bilirubin 04/03/2015 1.2  0.3 - 1.2 mg/dL Final  . GFR calc non Af Amer 04/03/2015 >60  >60 mL/min Final  . GFR calc Af Amer 04/03/2015 >60  >60 mL/min Final   Comment: (NOTE) The eGFR has been calculated using the CKD EPI equation. This calculation has not been validated in all clinical situations. eGFR's persistently <60 mL/min signify possible Chronic Kidney Disease.   . Anion gap 04/03/2015 9  5 - 15 Final  . Hgb A1c MFr Bld 04/03/2015 6.0  4.0 - 6.0 % Final    Assessment:  Dawn Wiley is an 81 y.o. female with stage IIIC right breast cancer status post mastectomy and axillary lymph node dissection on 08/31/2014. 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 Carcinoma involved skeletal muscle and dermal lymphatics. Tumor was less than 0.5 mm from the deep margin. Thirteen of 15 lymph nodes were involved. The largest metastatic focus was 2 cm. Tumor is triple negative (ER negative, PR negative, and HER-2/neu negative). Pathologic stage was [id number].  PET scan on 09/21/2014 revealed postoperative changes in the right chest with no suspicious findings or residual tumor. Bone scan on 10/11/2014 revealed no evidence of metastatic disease.  Bone density study on 10/02/2014 revealed a T score of -4.7 in the forearm consistent with osteoporosis. T-score was less than 2.5 in the spine or hip. Echocardiogram on 10/02/2014 revealed ejection fraction of 60-65%.  She has iron deficiency anemia. Labs on 09/19/2014 revealed a hematocrit of 29.9, hemoglobin 8.7, MCV 69.7, ferritin 9, and TIBC 616. B12 was low (265) with a prior history of B12 deficiency and need for supplementation (stopped 06/2014). She began B12 on 10/12/2014 (last 01/18/2015).  Folate was normal. Her diet is modest. She has never  had a colonoscopy. She denies any melena or hematochezia.  She continues to tolerate 1 iron pill a day.  She is eating red meat.  Hematocrit is stable to slightly improved.  Chest wall biopsy x 3 (lateral, medial, and middle sites) on 11/08/2014 revealed dermal lymphatic involvement by invasive mammary carcinoma with micropapillary features.    She has aggressive disease with growth despite several chemotherapeutic agents.  She received 5 weeks of Taxol (11/03/2014 - 12/01/2014).  She received 1 week of adriamycin (12/14/2014).  She received 1 cycle of  carboplatin (12/28/2014).  After carboplatin, disease appeared to stabilize, but with 2 weeks off of therapy, her disease grew again (medial lesion larger and weaping).   She is status post 3 cycles of carboplatin and Taxol (01/18/2015 - 03/02/2015).  Chest wall initially responded then began to grow.  She is currently day 9 status post cycle #1 AC (03/26/2015) with Neulasta support.  Chest was lesions are softer and more dry.  She is fatigued.  She is neutropenic (Okay 200).  Plan: 1.  Labs today:  CBC with diff. 2.  Review fever and neutropenia precautions. 3.  Discuss caloric intake.  Discuss blood sugar. 4.  Keep appointments in [place]. 5.  RTC on 12/13 in [place] for MD labs (CBC with diff, CMP, Mg). 6.  Nurse to call patient with lab results. 7.  RTC on 12/14 in [place] for MD assessment and cycle #2 AC.   [name], MD  04/03/2015

## 2015-05-02 ENCOUNTER — Inpatient Hospital Stay: Payer: PPO | Attending: Hematology and Oncology

## 2015-05-02 ENCOUNTER — Inpatient Hospital Stay (HOSPITAL_BASED_OUTPATIENT_CLINIC_OR_DEPARTMENT_OTHER): Payer: PPO | Admitting: Hematology and Oncology

## 2015-05-02 ENCOUNTER — Encounter: Payer: Self-pay | Admitting: Hematology and Oncology

## 2015-05-02 ENCOUNTER — Inpatient Hospital Stay: Payer: PPO

## 2015-05-02 VITALS — BP 143/74 | HR 90 | Resp 18 | Ht 60.0 in | Wt 110.0 lb

## 2015-05-02 DIAGNOSIS — Z7984 Long term (current) use of oral hypoglycemic drugs: Secondary | ICD-10-CM | POA: Insufficient documentation

## 2015-05-02 DIAGNOSIS — K121 Other forms of stomatitis: Secondary | ICD-10-CM | POA: Diagnosis not present

## 2015-05-02 DIAGNOSIS — C50911 Malignant neoplasm of unspecified site of right female breast: Secondary | ICD-10-CM | POA: Diagnosis not present

## 2015-05-02 DIAGNOSIS — Z7982 Long term (current) use of aspirin: Secondary | ICD-10-CM | POA: Diagnosis not present

## 2015-05-02 DIAGNOSIS — Z171 Estrogen receptor negative status [ER-]: Secondary | ICD-10-CM | POA: Insufficient documentation

## 2015-05-02 DIAGNOSIS — C7989 Secondary malignant neoplasm of other specified sites: Secondary | ICD-10-CM

## 2015-05-02 DIAGNOSIS — Z5111 Encounter for antineoplastic chemotherapy: Secondary | ICD-10-CM | POA: Diagnosis present

## 2015-05-02 DIAGNOSIS — E785 Hyperlipidemia, unspecified: Secondary | ICD-10-CM | POA: Diagnosis not present

## 2015-05-02 DIAGNOSIS — E119 Type 2 diabetes mellitus without complications: Secondary | ICD-10-CM | POA: Insufficient documentation

## 2015-05-02 DIAGNOSIS — E876 Hypokalemia: Secondary | ICD-10-CM

## 2015-05-02 DIAGNOSIS — Z79899 Other long term (current) drug therapy: Secondary | ICD-10-CM

## 2015-05-02 DIAGNOSIS — M129 Arthropathy, unspecified: Secondary | ICD-10-CM

## 2015-05-02 DIAGNOSIS — I2510 Atherosclerotic heart disease of native coronary artery without angina pectoris: Secondary | ICD-10-CM | POA: Diagnosis not present

## 2015-05-02 DIAGNOSIS — Z9221 Personal history of antineoplastic chemotherapy: Secondary | ICD-10-CM | POA: Insufficient documentation

## 2015-05-02 DIAGNOSIS — Z9011 Acquired absence of right breast and nipple: Secondary | ICD-10-CM | POA: Insufficient documentation

## 2015-05-02 DIAGNOSIS — E538 Deficiency of other specified B group vitamins: Secondary | ICD-10-CM | POA: Diagnosis not present

## 2015-05-02 DIAGNOSIS — R21 Rash and other nonspecific skin eruption: Secondary | ICD-10-CM | POA: Diagnosis not present

## 2015-05-02 DIAGNOSIS — C779 Secondary and unspecified malignant neoplasm of lymph node, unspecified: Secondary | ICD-10-CM | POA: Insufficient documentation

## 2015-05-02 DIAGNOSIS — G4700 Insomnia, unspecified: Secondary | ICD-10-CM | POA: Diagnosis not present

## 2015-05-02 DIAGNOSIS — D509 Iron deficiency anemia, unspecified: Secondary | ICD-10-CM | POA: Insufficient documentation

## 2015-05-02 DIAGNOSIS — I10 Essential (primary) hypertension: Secondary | ICD-10-CM | POA: Diagnosis not present

## 2015-05-02 DIAGNOSIS — Z87442 Personal history of urinary calculi: Secondary | ICD-10-CM | POA: Diagnosis not present

## 2015-05-02 DIAGNOSIS — G893 Neoplasm related pain (acute) (chronic): Secondary | ICD-10-CM | POA: Diagnosis not present

## 2015-05-02 LAB — COMPREHENSIVE METABOLIC PANEL
ALT: 9 U/L — ABNORMAL LOW (ref 14–54)
AST: 17 U/L (ref 15–41)
Albumin: 4 g/dL (ref 3.5–5.0)
Alkaline Phosphatase: 51 U/L (ref 38–126)
Anion gap: 6 (ref 5–15)
BUN: 14 mg/dL (ref 6–20)
CO2: 27 mmol/L (ref 22–32)
Calcium: 9.4 mg/dL (ref 8.9–10.3)
Chloride: 103 mmol/L (ref 101–111)
Creatinine, Ser: 0.45 mg/dL (ref 0.44–1.00)
GFR calc Af Amer: >60 mL/min (ref >60)
GFR calc non Af Amer: >60 mL/min (ref >60)
Glucose, Bld: 159 mg/dL — ABNORMAL HIGH (ref 65–99)
Potassium: 3.4 mmol/L — ABNORMAL LOW (ref 3.5–5.1)
Sodium: 136 mmol/L (ref 135–145)
Total Bilirubin: 0.4 mg/dL (ref 0.3–1.2)
Total Protein: 6.4 g/dL — ABNORMAL LOW (ref 6.5–8.1)

## 2015-05-02 LAB — CBC WITH DIFFERENTIAL/PLATELET
Basophils Absolute: 0 10*3/uL (ref 0–0.1)
Basophils Relative: 1 %
Eosinophils Absolute: 0 10*3/uL (ref 0–0.7)
Eosinophils Relative: 0 %
HCT: 28.8 % — ABNORMAL LOW (ref 35.0–47.0)
Hemoglobin: 9.4 g/dL — ABNORMAL LOW (ref 12.0–16.0)
Lymphocytes Relative: 8 %
Lymphs Abs: 0.6 10*3/uL — ABNORMAL LOW (ref 1.0–3.6)
MCH: 31.2 pg (ref 26.0–34.0)
MCHC: 32.5 g/dL (ref 32.0–36.0)
MCV: 95.9 fL (ref 80.0–100.0)
Monocytes Absolute: 1 10*3/uL — ABNORMAL HIGH (ref 0.2–0.9)
Monocytes Relative: 14 %
Neutro Abs: 5.5 10*3/uL (ref 1.4–6.5)
Neutrophils Relative %: 77 %
Platelets: 357 10*3/uL (ref 150–440)
RBC: 3 MIL/uL — ABNORMAL LOW (ref 3.80–5.20)
RDW: 24.6 % — ABNORMAL HIGH (ref 11.5–14.5)
WBC: 7.2 10*3/uL (ref 3.6–11.0)

## 2015-05-02 MED ORDER — SODIUM CHLORIDE 0.9 % IJ SOLN
10.0000 mL | INTRAMUSCULAR | Status: DC | PRN
  Administered 2015-05-02: 10 mL
  Filled 2015-05-02: qty 10

## 2015-05-02 MED ORDER — HEPARIN SOD (PORK) LOCK FLUSH 100 UNIT/ML IV SOLN
500.0000 [IU] | Freq: Once | INTRAVENOUS | Status: AC
  Administered 2015-05-02: 500 [IU] via INTRAVENOUS

## 2015-05-02 NOTE — Progress Notes (Signed)
[facility] day:  05/02/2015   Chief Complaint: Dawn Wiley is an 81 y.o. female with stage IIIC right breast cancer who is seen for assessment prior to cycle #3 adriamycin and Cytoxan.  HPI: The patient was last seen in the medical oncology clinic on 04/25/2015.  At that time, cycle #3 Adriamycin and Cytoxan was postponed secondary to decreased oral intake and weight loss secondary to active oral ulceration.  Magic mouthwash was prescribed.  In addition, there was a subtle suggestion of early progression.  Per prior plan, she underwent PET scan.  PET scan on 04/26/2015 revealed extensive recurrence about the right chest.  The far lateral component measured 4.2 x 2.2 cm (S.U.V.13.5).  A portion superficial to the right-side of the sternum measured 5.2 x 2.2 cm (S.U.V. 9.1).  There was diffuse more inferior and lateral chest wall soft tissue thickening and hypermetabolism.  During the interim, her mouth has healed.  She is eating better.  She has gained weight.  Food tastes better.  She feels a few more "kots" medially on her chest.  Past Medical History  Diagnosis Date  . Kidney stones   . Hyperlipidemia   . Coronary artery disease     Coronary calcifications noted on CT scan  . Diabetes mellitus without complication ([place])   . Nephrolithiasis   . Hypertension   . Arthritis   . Anemia   . Tachycardia     Dr. Fletcher Anon, cardiologist  . Cancer [facility])     breast (right)  . PONV (postoperative nausea and vomiting)   . Rash     back  . Breast cancer [facility])     Past Surgical History  Procedure Laterality Date  . Temporomandibular joint surgery    . Cataract extraction      bilateral  . Breast surgery      breast biopsy X 3  . Eye surgery      bilateral cataract extraction  . Mastectomy modified radical Right 08/31/2014    Procedure: MASTECTOMY MODIFIED RADICAL;  Surgeon: Molly Maduro, MD;  Location: [facility] ORS;  Service: General;   Laterality: Right;  . Portacath placement Left 10/20/2014    Procedure: INSERTION PORT-A-CATH;  Surgeon: Marlyce Huge, MD;  Location: [facility] ORS;  Service: General;  Laterality: Left;    Family History  Problem Relation Age of Onset  . Peripheral Artery Disease Sister     carotid artery stenosis   . Heart disease Sister   . Diabetes Sister     Social History:  reports that she has never smoked. She has never used smokeless tobacco. She reports that she does not drink alcohol or use illicit drugs.  The patient is accompanied by her daughter today.  Her phone number is [phone].  Allergies: No Known Allergies  Current Medications: Current Outpatient Prescriptions  Medication Sig Dispense Refill  . acetaminophen (TYLENOL) 500 MG tablet Take 500 mg by mouth every 6 (six) hours as needed for mild pain or moderate pain.     [name] Kitchen amLODipine (NORVASC) 5 MG tablet take 1 tablet by mouth once daily 90 tablet 1  . aspirin 81 MG tablet Take 81 mg by mouth every other day.    . Calcium Carbonate (CALTRATE 600 PO) Take 1,200 mg by mouth daily.     . cholecalciferol (VITAMIN D) 400 UNITS TABS tablet Take 800 Units by mouth.    . Cyanocobalamin (VITAMIN B-12 IJ) Inject as directed every 30 (thirty) days.     [name] Kitchen  dexamethasone (DECADRON) 4 MG tablet Take 2 tablets by mouth once a day on the day after chemotherapy and then take 2 tablets two times a day for 2 days. Take with food. 30 tablet 1  . diphenhydrAMINE (BENADRYL) 25 mg capsule Take 25 mg by mouth every 6 (six) hours as needed for allergies.    . ferrous sulfate 325 (65 FE) MG tablet Take 325 mg by mouth daily with breakfast.     . HYDROcodone-acetaminophen (NORCO/VICODIN) 5-325 MG tablet Take 1 tablet by mouth every 6 (six) hours as needed for moderate pain. 20 tablet 0  . lidocaine-prilocaine (EMLA) cream Apply 1 application topically as needed. Apply 1-2 hours prior to chemotherapy 30 g 1  . loratadine (CLARITIN) 10 MG tablet Take 1  tablet (10 mg total) by mouth daily. 30 tablet 0  . LORazepam (ATIVAN) 0.5 MG tablet Take 1 tablet (0.5 mg total) by mouth every 6 (six) hours as needed (Nausea or vomiting). 30 tablet 0  . magic mouthwash SOLN Take 5 mLs by mouth 4 (four) times daily. 200 mL 1  . metFORMIN (GLUCOPHAGE) 500 MG tablet Take 1 tablet (500 mg total) by mouth 2 (two) times daily with a meal. 180 tablet 1  . metoprolol tartrate (LOPRESSOR) 25 MG tablet Take 1 tablet (25 mg total) by mouth 2 (two) times daily. 180 tablet 2  . ondansetron (ZOFRAN) 8 MG tablet Take 1 tablet (8 mg total) by mouth 2 (two) times daily as needed. Start on the third day after chemotherapy. 30 tablet 1  . oxyCODONE-acetaminophen (PERCOCET/ROXICET) 5-325 MG per tablet Take 1 tablet by mouth every 4 (four) hours as needed for severe pain. 20 tablet 0   No current facility-administered medications for this visit.   Facility-Administered Medications Ordered in Other Visits  Medication Dose Route Frequency Provider Last Rate Last Dose  . heparin lock flush 100 unit/mL  500 Units Intracatheter Once PRN [name], MD      . sodium chloride 0.9 % injection 10 mL  10 mL Intravenous PRN [name], MD      . sodium chloride 0.9 % injection 10 mL  10 mL Intravenous PRN [name], MD   10 mL at 03/21/15 0849  . sodium chloride 0.9 % injection 10 mL  10 mL Intracatheter PRN [name], MD      . sodium chloride 0.9 % injection 10 mL  10 mL Intracatheter PRN [name], MD   10 mL at 04/25/15 0834  . sodium chloride 0.9 % injection 10 mL  10 mL Intracatheter PRN [name], MD   10 mL at 05/02/15 1191    Review of Systems:  GENERAL: Feels good. No fevers or sweats. Weight up 2 pounds. PERFORMANCE STATUS (ECOG): 1 HEENT: Oral ulcers, resolved.  No visual changes, runny nose, sore throat, or tenderness. Lungs: No shortness of breath or cough. No hemoptysis. Cardiac: No chest pain, palpitations,  orthopnea, or PND. GI: No nausea, vomiting, diarrhea, constipation, melena or hematochezia. GU: No urgency, frequency, dysuria, or hematuria. Musculoskeletal: No back pain. No joint pain. No muscle tenderness. Extremities: No pain or swelling. Skin: Chest wall lesions, growing (new central "knots"). Neuro: No headache, numbness or weakness, balance or coordination issues. Endocrine: No diabetes, thyroid issues, hot flashes or night sweats. Psych: No mood changes, depression or anxiety. Pain: No focal pain. Review of systems: All other systems reviewed and found to be negative.  Physical Exam: Blood pressure 143/74, pulse  90, resp. rate 18, height 5' (1.524 m), weight 110 lb (49.896 kg). GENERAL: Thin elderly woman sitting comfortably in the exam room in no acute distress. MENTAL STATUS: Alert and oriented to person, place and time. HEAD: Short silver wig. Normocephalic, atraumatic, face symmetric, no Cushingoid features. EYES: Blue eyes. Pupils equal round and reactive to light and accomodation. No conjunctivitis or scleral icterus. ENT: No oral lesions.  Mucous membranes moist.  RESPIRATORY: Clear to auscultation without rales, wheezes or rhonchi. CARDIOVASCULAR: Regular rate and rhythm without murmur, rub or gallop. BREAST: Right mastectomy. Breast lesions dry.  Nodular lesion measuring 6.5 x 5 cm medially (more bulbous).  Thin halo of patchy erythema extending medially 4 cm.  Several confluent palpable, more nodular ruddy lesions extending laterally under axillae difficult to measure.  Skin changes extending 8.5 cm superiorly from mastectomy incision.  Medial to lateral skin changes approximately 22 cm. ABDOMEN: Soft, non-tender, with active bowel sounds, and no hepatosplenomegaly. No masses. SKIN: Chest wall lesions as above. EXTREMITIES: No edema, no skin discoloration or tenderness. No palpable cords. LYMPH NODES: No palpable cervical, supraclavicular,  axillary or inguinal adenopathy  NEUROLOGICAL: Unremarkable. PSYCH: Appropriate.   Infusion on 05/02/2015  Component Date Value Ref Range Status  . WBC 05/02/2015 7.2  3.6 - 11.0 K/uL Final  . RBC 05/02/2015 3.00* 3.80 - 5.20 MIL/uL Final  . Hemoglobin 05/02/2015 9.4* 12.0 - 16.0 g/dL Final  . HCT 05/02/2015 28.8* 35.0 - 47.0 % Final  . MCV 05/02/2015 95.9  80.0 - 100.0 fL Final  . MCH 05/02/2015 31.2  26.0 - 34.0 pg Final  . MCHC 05/02/2015 32.5  32.0 - 36.0 g/dL Final  . RDW 05/02/2015 24.6* 11.5 - 14.5 % Final  . Platelets 05/02/2015 357  150 - 440 K/uL Final  . Neutrophils Relative % 05/02/2015 77   Final  . Neutro Abs 05/02/2015 5.5  1.4 - 6.5 K/uL Final  . Lymphocytes Relative 05/02/2015 8   Final  . Lymphs Abs 05/02/2015 0.6* 1.0 - 3.6 K/uL Final  . Monocytes Relative 05/02/2015 14   Final  . Monocytes Absolute 05/02/2015 1.0* 0.2 - 0.9 K/uL Final  . Eosinophils Relative 05/02/2015 0   Final  . Eosinophils Absolute 05/02/2015 0.0  0 - 0.7 K/uL Final  . Basophils Relative 05/02/2015 1   Final  . Basophils Absolute 05/02/2015 0.0  0 - 0.1 K/uL Final  . Sodium 05/02/2015 136  135 - 145 mmol/L Final  . Potassium 05/02/2015 3.4* 3.5 - 5.1 mmol/L Final  . Chloride 05/02/2015 103  101 - 111 mmol/L Final  . CO2 05/02/2015 27  22 - 32 mmol/L Final  . Glucose, Bld 05/02/2015 159* 65 - 99 mg/dL Final  . BUN 05/02/2015 14  6 - 20 mg/dL Final  . Creatinine, Ser 05/02/2015 0.45  0.44 - 1.00 mg/dL Final  . Calcium 05/02/2015 9.4  8.9 - 10.3 mg/dL Final  . Total Protein 05/02/2015 6.4* 6.5 - 8.1 g/dL Final  . Albumin 05/02/2015 4.0  3.5 - 5.0 g/dL Final  . AST 05/02/2015 17  15 - 41 U/L Final  . ALT 05/02/2015 9* 14 - 54 U/L Final  . Alkaline Phosphatase 05/02/2015 51  38 - 126 U/L Final  . Total Bilirubin 05/02/2015 0.4  0.3 - 1.2 mg/dL Final  . GFR calc non Af Amer 05/02/2015 >60  >60 mL/min Final  . GFR calc Af Amer 05/02/2015 >60  >60 mL/min Final   Comment: (NOTE) The eGFR has  been calculated  using the CKD EPI equation. This calculation has not been validated in all clinical situations. eGFR's persistently <60 mL/min signify possible Chronic Kidney Disease.   . Anion gap 05/02/2015 6  5 - 15 Final    Assessment:  Dawn Wiley is an 81 y.o. female with stage IIIC right breast cancer status post mastectomy and axillary lymph node dissection on 08/31/2014. 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 Carcinoma involved skeletal muscle and dermal lymphatics. Tumor was less than 0.5 mm from the deep margin. Thirteen of 15 lymph nodes were involved. The largest metastatic focus was 2 cm. Tumor is triple negative (ER negative, PR negative, and HER-2/neu negative). Pathologic stage was [id number].  PET scan on 09/21/2014 revealed postoperative changes in the right chest with no suspicious findings or residual tumor. Bone scan on 10/11/2014 revealed no evidence of metastatic disease.  Bone density study on 10/02/2014 revealed a T score of -4.7 in the forearm consistent with osteoporosis. T-score was less than 2.5 in the spine or hip. Echocardiogram on 10/02/2014 revealed ejection fraction of 60-65%.  She has iron deficiency anemia. Labs on 09/19/2014 revealed a hematocrit of 29.9, hemoglobin 8.7, MCV 69.7, ferritin 9, and TIBC 616. B12 was low (265) with a prior history of B12 deficiency and need for supplementation (stopped 06/2014). She began B12 on 10/12/2014 (last 04/25/2015).  Folate was normal. Her diet is modest. She has never had a colonoscopy. She denies any melena or hematochezia.  She takes 1 iron pill a day (additional iron causes diarrhea).  Ferritin was 116 on 04/25/2015.  Chest wall biopsy x 3 (lateral, medial, and middle sites) on 11/08/2014 revealed dermal lymphatic involvement by invasive mammary carcinoma with micropapillary features.    She has aggressive disease with growth despite several  chemotherapeutic agents.  She received 5 weeks of Taxol (11/03/2014 - 12/01/2014).  She received 1 week of adriamycin (12/14/2014).  She received 1 cycle of carboplatin (12/28/2014).  After carboplatin, disease appeared to stabilize, but with 2 weeks off of therapy, her disease grew again (medial lesion larger and weaping).   She received 3 cycle of carboplatin and Taxol (01/18/2015 - 03/02/2015).  Chest wall lesions initially decreased then began to grow.  She is status post 2 cycle of adriamycin and Cytoxan (03/26/2015 - 04/11/2015) with Neulasta support.  ANC nadir was 200 on day 9 (04/03/2015).  Cycle #2 was complicated by mouth sores, decreased oral intake, and weight loss.   PET scan on 04/26/2015 revealed extensive recurrence about the right chest.  The far lateral component measured 4.2 x 2.2 cm (S.U.V.13.5).  A portion superficial to the right-side of the sternum measured 5.2 x 2.2 cm (S.U.V. 9.1).  There was diffuse more inferior and lateral chest wall soft tissue thickening and hypermetabolism.  Symptomatically, she is doing well.  Chest wall lesions have grown significantly in the past week.  She has hypokalemia.    Plan: 1.  Labs today:  CBC with diff, CMP, CA27.29. 2.  Review PET scan. 3.  No chemotherapy today. 4.  Present at tumor board tomorrow. 5.  Discus treatment options (protocol enrollment, systemic chemotherapy versus local radiation +/- Xeloda). Bee re:  protocol availability. 7.  Contact patient's daughter tomorrow after tumor board. 8.  Continue potassium chloride 10 meq po q day. 9.  RTC in 1 week for MD assessment.   [name], MD  05/02/2015 , 12:36 PM

## 2015-05-03 ENCOUNTER — Other Ambulatory Visit: Payer: Self-pay | Admitting: Hematology and Oncology

## 2015-05-03 ENCOUNTER — Encounter: Payer: Self-pay | Admitting: *Deleted

## 2015-05-03 LAB — CANCER ANTIGEN 27.29: CA 27.29: 118.4 U/mL — ABNORMAL HIGH (ref 0.0–38.6)

## 2015-05-03 NOTE — Progress Notes (Signed)
Faxed path add on request for PDL-1 and testosterone to pathology.

## 2015-05-04 ENCOUNTER — Telehealth: Payer: Self-pay | Admitting: *Deleted

## 2015-05-04 ENCOUNTER — Other Ambulatory Visit: Payer: Self-pay | Admitting: *Deleted

## 2015-05-04 ENCOUNTER — Encounter: Payer: Self-pay | Admitting: Radiation Oncology

## 2015-05-04 ENCOUNTER — Ambulatory Visit
Admission: RE | Admit: 2015-05-04 | Discharge: 2015-05-04 | Disposition: A | Discharge status: Home / Self Care | Payer: PPO | Source: Ambulatory Visit | Attending: Hematology and Oncology | Admitting: Hematology and Oncology

## 2015-05-04 VITALS — BP 137/76 | HR 88 | Temp 95.0°F | Resp 20 | Wt 108.9 lb

## 2015-05-04 DIAGNOSIS — C50911 Malignant neoplasm of unspecified site of right female breast: Secondary | ICD-10-CM

## 2015-05-04 DIAGNOSIS — Z510 Encounter for antineoplastic radiation therapy: Secondary | ICD-10-CM | POA: Insufficient documentation

## 2015-05-04 DIAGNOSIS — C7989 Secondary malignant neoplasm of other specified sites: Secondary | ICD-10-CM | POA: Insufficient documentation

## 2015-05-04 MED ORDER — TEMAZEPAM 7.5 MG PO CAPS: 7.5000 mg | ORAL_CAPSULE | Freq: Every evening | ORAL | Status: DC | PRN

## 2015-05-04 NOTE — Progress Notes (Signed)
Radiation Oncology Follow up Note  Name: Dawn Wiley   Date:   05/04/2015 MRN:  [id number] DOB: Oct 06, 1934    This 81 y.o. female presents to the clinic today for progression of stage IIIc breast cancer.  REFERRING PROVIDER: Crecencio Mc, MD  HPI: Patient is a 81 year old female initially consult back in June 2016 when she presented with noticeable enlargement of her right breast over several months presented with a 14.7 cm invasive mammary carcinoma status post right modified radical mastectomy with axillary lymph node dissection. Tumor involved the skeletal muscle and dermal lymphatics making an inflammatory carcinoma. 13 of 15 nodes were positive for metastatic disease. Postop PET showed residual hypermetabolic activity in the right chest wall and was started on systemic chemotherapy.. She has developed marked progressive disease try to multiple chemotherapy regimens including Cytoxan and Adriamycin with now evidence of significant progression. PET scan performed 04/26/2015 showed extensive recurrence in the right chest wall. She has slightly ulcerative lesions now extending throughout the right chest wall lateral to medial involvement. There is no evidence of axillary lymph node involvement by PET CT criteria. I've been asked by medical oncology to consider palliative radiation therapy at this time with some concurrent weekly cisplatin chemotherapy. Patient is having some occasional tenderness and shooting pains in the chest although for the amount of disease present surprisingly not significant pain is involved.  COMPLICATIONS OF TREATMENT: none  FOLLOW UP COMPLIANCE: keeps appointments   PHYSICAL EXAM:  BP 137/76 mmHg  Pulse 88  Temp(Src) 95 F (35 C)  Resp 20  Wt 108 lb 14.5 oz (49.4 kg) Thin female in NAD. She has significant erythematous tumor involvement grossly of the right chest wall extending to the mid axilla and to the midline of the sternum. Slight areas of ulceration  are beginning to form. No evidence of supraclavicular or axillary adenopathy is appreciated. Well-developed well-nourished patient in NAD. HEENT reveals PERLA, EOMI, discs not visualized.  Oral cavity is clear. No oral mucosal lesions are identified. Neck is clear without evidence of cervical or supraclavicular adenopathy. Lungs are clear to A&P. Cardiac examination is essentially unremarkable with regular rate and rhythm without murmur rub or thrill. Abdomen is benign with no organomegaly or masses noted. Motor sensory and DTR levels are equal and symmetric in the upper and lower extremities. Cranial nerves II through XII are grossly intact. Proprioception is intact. No peripheral adenopathy or edema is identified. No motor or sensory levels are noted. Crude visual fields are within normal range.   RADIOLOGY RESULTS: PET CT scans are reviewed compatible with the above-stated findings  PLAN: Case was presented at our weekly tumor conference. I favor going ahead with palliative radiation therapy with concurrent cis-platinum weekly chemotherapy. I would plan on delivering 5000 cGy over 5 weeks in evaluating for response. Risks and benefits of treatment including possible skin breakdown despite our best efforts, fatigue, inclusion of superficial lung, alteration of blood counts all were explained in detail to the patient and her daughter. I have set up and ordered CT simulation for early next week. We'll coordinate her chemotherapy with medical oncology.  I would like to take this opportunity for allowing me to participate in the care of your patient.Armstead Peaks., MD

## 2015-05-04 NOTE — Telephone Encounter (Signed)
Prescription for Restoril ordered by Dr. Mike Gip, printed and placed with [name].  Patient notified that she or a family member could come by and pick up the Rx.

## 2015-05-07 ENCOUNTER — Ambulatory Visit: Payer: PPO | Admitting: Internal Medicine

## 2015-05-07 ENCOUNTER — Telehealth: Payer: Self-pay | Admitting: *Deleted

## 2015-05-07 NOTE — Telephone Encounter (Signed)
She had her a1c done in December so she is not due for any labs for me until March

## 2015-05-07 NOTE — Telephone Encounter (Signed)
Patient stated that she has a port and would like to have any lab work orders that Dr. Derrel Nip may need placed, so that she can have them drawn at Dr. Mike Gip office.

## 2015-05-07 NOTE — Telephone Encounter (Signed)
No labs needed till March per Dr. Derrel Nip.  Please advise patient.  Thanks

## 2015-05-07 NOTE — Telephone Encounter (Signed)
Please advise labs.  Please order if indicated.  Thanks

## 2015-05-08 ENCOUNTER — Ambulatory Visit
Admission: RE | Admit: 2015-05-08 | Discharge: 2015-05-08 | Disposition: A | Discharge status: Home / Self Care | Payer: PPO | Source: Ambulatory Visit | Attending: Radiation Oncology | Admitting: Radiation Oncology

## 2015-05-08 DIAGNOSIS — C7989 Secondary malignant neoplasm of other specified sites: Secondary | ICD-10-CM | POA: Diagnosis not present

## 2015-05-08 DIAGNOSIS — C50911 Malignant neoplasm of unspecified site of right female breast: Secondary | ICD-10-CM | POA: Diagnosis not present

## 2015-05-08 DIAGNOSIS — Z510 Encounter for antineoplastic radiation therapy: Secondary | ICD-10-CM | POA: Diagnosis not present

## 2015-05-09 ENCOUNTER — Inpatient Hospital Stay (HOSPITAL_BASED_OUTPATIENT_CLINIC_OR_DEPARTMENT_OTHER): Payer: PPO | Admitting: Hematology and Oncology

## 2015-05-09 VITALS — BP 143/73 | HR 89 | Temp 97.4°F | Resp 18 | Ht 60.0 in | Wt 108.7 lb

## 2015-05-09 DIAGNOSIS — Z7982 Long term (current) use of aspirin: Secondary | ICD-10-CM

## 2015-05-09 DIAGNOSIS — E876 Hypokalemia: Secondary | ICD-10-CM

## 2015-05-09 DIAGNOSIS — E785 Hyperlipidemia, unspecified: Secondary | ICD-10-CM

## 2015-05-09 DIAGNOSIS — Z9011 Acquired absence of right breast and nipple: Secondary | ICD-10-CM

## 2015-05-09 DIAGNOSIS — Z7984 Long term (current) use of oral hypoglycemic drugs: Secondary | ICD-10-CM

## 2015-05-09 DIAGNOSIS — Z9221 Personal history of antineoplastic chemotherapy: Secondary | ICD-10-CM

## 2015-05-09 DIAGNOSIS — D509 Iron deficiency anemia, unspecified: Secondary | ICD-10-CM

## 2015-05-09 DIAGNOSIS — R21 Rash and other nonspecific skin eruption: Secondary | ICD-10-CM

## 2015-05-09 DIAGNOSIS — C779 Secondary and unspecified malignant neoplasm of lymph node, unspecified: Secondary | ICD-10-CM | POA: Diagnosis not present

## 2015-05-09 DIAGNOSIS — Z171 Estrogen receptor negative status [ER-]: Secondary | ICD-10-CM

## 2015-05-09 DIAGNOSIS — C50911 Malignant neoplasm of unspecified site of right female breast: Secondary | ICD-10-CM

## 2015-05-09 DIAGNOSIS — Z79899 Other long term (current) drug therapy: Secondary | ICD-10-CM

## 2015-05-09 DIAGNOSIS — C7989 Secondary malignant neoplasm of other specified sites: Secondary | ICD-10-CM

## 2015-05-09 DIAGNOSIS — K121 Other forms of stomatitis: Secondary | ICD-10-CM

## 2015-05-09 DIAGNOSIS — M129 Arthropathy, unspecified: Secondary | ICD-10-CM

## 2015-05-09 DIAGNOSIS — I2510 Atherosclerotic heart disease of native coronary artery without angina pectoris: Secondary | ICD-10-CM

## 2015-05-09 DIAGNOSIS — E119 Type 2 diabetes mellitus without complications: Secondary | ICD-10-CM

## 2015-05-09 DIAGNOSIS — E538 Deficiency of other specified B group vitamins: Secondary | ICD-10-CM

## 2015-05-09 DIAGNOSIS — Z87442 Personal history of urinary calculi: Secondary | ICD-10-CM

## 2015-05-09 DIAGNOSIS — I10 Essential (primary) hypertension: Secondary | ICD-10-CM

## 2015-05-10 DIAGNOSIS — Z510 Encounter for antineoplastic radiation therapy: Secondary | ICD-10-CM | POA: Diagnosis not present

## 2015-05-13 ENCOUNTER — Encounter: Payer: Self-pay | Admitting: Hematology and Oncology

## 2015-05-13 NOTE — Progress Notes (Signed)
[facility] day:  05/09/2015   Chief Complaint: Dawn Wiley is an 81 y.o. female with stage IIIC right breast cancer who is seen for reassessment.  HPI: The patient was last seen in the medical oncology clinic on 05/02/2015.  At that time, her mouth had healed.  She was eating better.  She had gained weight. She felt a few more "kots" medially on her chest.  Exam revealed that the chest wall lesions had grown significantly in the past week.  She had hypokalemia. Chemotherapy was cancelled.    PET scan on 04/26/2015 revealed extensive recurrence about the right chest.  There was no evidence of distant disease.  We discussed consideration of local radiation with chemotherapy as a radiation sensitizer. She was presented at tumor board on 05/03/2015.  Decision was made to proceed with radiation and weekly carboplatin.  She met with Dr, Baruch Gouty of radiation oncology on 05/04/2015.  Radiation starts next week.  Symptomatically, she notes ongoing growth of chest wall lesions.  She notes a rash or "something" on her right arm.  She denies any chest wall pain.  Past Medical History  Diagnosis Date  . Kidney stones   . Hyperlipidemia   . Coronary artery disease     Coronary calcifications noted on CT scan  . Diabetes mellitus without complication ([place])   . Nephrolithiasis   . Hypertension   . Arthritis   . Anemia   . Tachycardia     Dr. Fletcher Anon, cardiologist  . Cancer [facility])     breast (right)  . PONV (postoperative nausea and vomiting)   . Rash     back  . Breast cancer [facility])     Past Surgical History  Procedure Laterality Date  . Temporomandibular joint surgery    . Cataract extraction      bilateral  . Breast surgery      breast biopsy X 3  . Eye surgery      bilateral cataract extraction  . Mastectomy modified radical Right 08/31/2014    Procedure: MASTECTOMY MODIFIED RADICAL;  Surgeon: Molly Maduro, MD;  Location: [facility] ORS;   Service: General;  Laterality: Right;  . Portacath placement Left 10/20/2014    Procedure: INSERTION PORT-A-CATH;  Surgeon: Marlyce Huge, MD;  Location: [facility] ORS;  Service: General;  Laterality: Left;    Family History  Problem Relation Age of Onset  . Peripheral Artery Disease Sister     carotid artery stenosis   . Heart disease Sister   . Diabetes Sister     Social History:  reports that she has never smoked. She has never used smokeless tobacco. She reports that she does not drink alcohol or use illicit drugs.  The patient is accompanied by her daughter today.  Her phone number is [phone].  Allergies: No Known Allergies  Current Medications: Current Outpatient Prescriptions  Medication Sig Dispense Refill  . acetaminophen (TYLENOL) 500 MG tablet Take 500 mg by mouth every 6 (six) hours as needed for mild pain or moderate pain.     [name] Kitchen amLODipine (NORVASC) 5 MG tablet take 1 tablet by mouth once daily 90 tablet 1  . aspirin 81 MG tablet Take 81 mg by mouth every other day.    . Calcium Carbonate (CALTRATE 600 PO) Take 1,200 mg by mouth daily.     . cholecalciferol (VITAMIN D) 400 UNITS TABS tablet Take 800 Units by mouth.    . Cyanocobalamin (VITAMIN B-12 IJ) Inject as  directed every 30 (thirty) days.     [name] Kitchen dexamethasone (DECADRON) 4 MG tablet take 2 tablets by mouth once daily ON THE DAY AFTER CHEMOTHERAPY ...  (REFER TO PRESCRIPTION NOTES).  0  . diphenhydrAMINE (BENADRYL) 25 mg capsule Take 25 mg by mouth every 6 (six) hours as needed for allergies.    . ferrous sulfate 325 (65 FE) MG tablet Take 325 mg by mouth daily with breakfast.     . HYDROcodone-acetaminophen (NORCO/VICODIN) 5-325 MG tablet Take 1 tablet by mouth every 6 (six) hours as needed for moderate pain. 20 tablet 0  . lidocaine-prilocaine (EMLA) cream Apply 1 application topically as needed. Apply 1-2 hours prior to chemotherapy 30 g 1  . loratadine (CLARITIN) 10 MG tablet Take 1 tablet (10 mg total) by  mouth daily. 30 tablet 0  . magic mouthwash SOLN Take 5 mLs by mouth 4 (four) times daily. 200 mL 1  . metFORMIN (GLUCOPHAGE) 500 MG tablet Take 1 tablet (500 mg total) by mouth 2 (two) times daily with a meal. 180 tablet 1  . metoprolol tartrate (LOPRESSOR) 25 MG tablet Take 1 tablet (25 mg total) by mouth 2 (two) times daily. 180 tablet 2  . ondansetron (ZOFRAN) 8 MG tablet take 1 tablet by mouth twice a day if needed .[name] KitchenMarland KitchenSTART ON THE THIRD DAY AFTER CHEMOTHERAPY  0  . oxyCODONE-acetaminophen (PERCOCET/ROXICET) 5-325 MG per tablet Take 1 tablet by mouth every 4 (four) hours as needed for severe pain. 20 tablet 0  . potassium chloride (K-DUR,KLOR-CON) 10 MEQ tablet daily  0  . temazepam (RESTORIL) 7.5 MG capsule Take 1 capsule (7.5 mg total) by mouth at bedtime as needed for sleep. 20 capsule 0   No current facility-administered medications for this visit.   Facility-Administered Medications Ordered in Other Visits  Medication Dose Route Frequency Provider Last Rate Last Dose  . sodium chloride 0.9 % injection 10 mL  10 mL Intravenous PRN [name], MD      . sodium chloride 0.9 % injection 10 mL  10 mL Intravenous PRN [name], MD   10 mL at 03/21/15 0849  . sodium chloride 0.9 % injection 10 mL  10 mL Intracatheter PRN [name], MD   10 mL at 04/25/15 1093    Review of Systems:  GENERAL: Feels "ok". No fevers or sweats. Weight stable. PERFORMANCE STATUS (ECOG): 1 HEENT: No visual changes, runny nose, sore throat, or tenderness. Lungs: No shortness of breath or cough. No hemoptysis. Cardiac: No chest pain, palpitations, orthopnea, or PND. GI: No nausea, vomiting, diarrhea, constipation, melena or hematochezia. GU: No urgency, frequency, dysuria, or hematuria. Musculoskeletal: No back pain. No joint pain. No muscle tenderness. Extremities: No pain or swelling. Skin: Chest wall lesions, growing. "Something" on right arm. Neuro: No headache,  numbness or weakness, balance or coordination issues. Endocrine: Diabetes.  No thyroid issues, hot flashes or night sweats. Psych: No mood changes, depression or anxiety. Pain: No focal pain. Review of systems: All other systems reviewed and found to be negative.  Physical Exam: Blood pressure 143/73, pulse 89, temperature 97.4 F (36.3 C), temperature source Tympanic, resp. rate 18, height 5' (1.524 m), weight 108 lb 11 oz (49.3 kg). GENERAL: Thin elderly woman sitting comfortably in the exam room in no acute distress. MENTAL STATUS: Alert and oriented to person, place and time. HEAD: Short silver wig. Normocephalic, atraumatic, face symmetric, no Cushingoid features. EYES: Blue eyes. No conjunctivitis or scleral icterus. BREAST: Right mastectomy. Breast  lesions dry.  Nodular lesion measuring 7 x 5 cm medially (bulbous).  Confluent palpable nodular ruddy lesions extending laterally under axillae (15.5 cm).  Skin changes extending 8.5 cm superiorly from mastectomy incision.  Medial to lateral skin changes approximately 22.5 cm. SKIN: Chest wall lesions as above.  Upper right arm with pink flat area (dry). EXTREMITIES: No edema, no skin discoloration or tenderness. No palpable cords. LYMPH NODES: No palpable axillary adenopathy  NEUROLOGICAL: Unremarkable. PSYCH: Appropriate.   No visits with results within 3 Day(s) from this visit. Latest known visit with results is:  Infusion on 05/02/2015  Component Date Value Ref Range Status  . CA 27.29 05/02/2015 118.4* 0.0 - 38.6 U/mL Final   Comment: (NOTE) [employer]/ACS methodology Performed At: [facility] [place], [place] [id number] Lindon Romp MD [id number]   . WBC 05/02/2015 7.2  3.6 - 11.0 K/uL Final  . RBC 05/02/2015 3.00* 3.80 - 5.20 MIL/uL Final  . Hemoglobin 05/02/2015 9.4* 12.0 - 16.0 g/dL Final  . HCT 05/02/2015 28.8* 35.0 - 47.0 % Final  . MCV 05/02/2015 95.9  80.0 - 100.0 fL  Final  . MCH 05/02/2015 31.2  26.0 - 34.0 pg Final  . MCHC 05/02/2015 32.5  32.0 - 36.0 g/dL Final  . RDW 05/02/2015 24.6* 11.5 - 14.5 % Final  . Platelets 05/02/2015 357  150 - 440 K/uL Final  . Neutrophils Relative % 05/02/2015 77   Final  . Neutro Abs 05/02/2015 5.5  1.4 - 6.5 K/uL Final  . Lymphocytes Relative 05/02/2015 8   Final  . Lymphs Abs 05/02/2015 0.6* 1.0 - 3.6 K/uL Final  . Monocytes Relative 05/02/2015 14   Final  . Monocytes Absolute 05/02/2015 1.0* 0.2 - 0.9 K/uL Final  . Eosinophils Relative 05/02/2015 0   Final  . Eosinophils Absolute 05/02/2015 0.0  0 - 0.7 K/uL Final  . Basophils Relative 05/02/2015 1   Final  . Basophils Absolute 05/02/2015 0.0  0 - 0.1 K/uL Final  . Sodium 05/02/2015 136  135 - 145 mmol/L Final  . Potassium 05/02/2015 3.4* 3.5 - 5.1 mmol/L Final  . Chloride 05/02/2015 103  101 - 111 mmol/L Final  . CO2 05/02/2015 27  22 - 32 mmol/L Final  . Glucose, Bld 05/02/2015 159* 65 - 99 mg/dL Final  . BUN 05/02/2015 14  6 - 20 mg/dL Final  . Creatinine, Ser 05/02/2015 0.45  0.44 - 1.00 mg/dL Final  . Calcium 05/02/2015 9.4  8.9 - 10.3 mg/dL Final  . Total Protein 05/02/2015 6.4* 6.5 - 8.1 g/dL Final  . Albumin 05/02/2015 4.0  3.5 - 5.0 g/dL Final  . AST 05/02/2015 17  15 - 41 U/L Final  . ALT 05/02/2015 9* 14 - 54 U/L Final  . Alkaline Phosphatase 05/02/2015 51  38 - 126 U/L Final  . Total Bilirubin 05/02/2015 0.4  0.3 - 1.2 mg/dL Final  . GFR calc non Af Amer 05/02/2015 >60  >60 mL/min Final  . GFR calc Af Amer 05/02/2015 >60  >60 mL/min Final   Comment: (NOTE) The eGFR has been calculated using the CKD EPI equation. This calculation has not been validated in all clinical situations. eGFR's persistently <60 mL/min signify possible Chronic Kidney Disease.   . Anion gap 05/02/2015 6  5 - 15 Final    Assessment:  CHAKA BOYSON is an 81 y.o. female with stage IIIC right breast cancer status post mastectomy and axillary lymph node dissection on  08/31/2014. 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 Carcinoma involved skeletal muscle and dermal lymphatics. Tumor was less than 0.5 mm from the deep margin. Thirteen of 15 lymph nodes were involved. The largest metastatic focus was 2 cm. Tumor is triple negative (ER negative, PR negative, and HER-2/neu negative). Pathologic stage was [id number].  PET scan on 09/21/2014 revealed postoperative changes in the right chest with no suspicious findings or residual tumor. Bone scan on 10/11/2014 revealed no evidence of metastatic disease.  Bone density study on 10/02/2014 revealed a T score of -4.7 in the forearm consistent with osteoporosis. T-score was less than 2.5 in the spine or hip. Echocardiogram on 10/02/2014 revealed ejection fraction of 60-65%.  She has iron deficiency anemia. Labs on 09/19/2014 revealed a hematocrit of 29.9, hemoglobin 8.7, MCV 69.7, ferritin 9, and TIBC 616. B12 was low (265) with a prior history of B12 deficiency and need for supplementation (stopped 06/2014). She began B12 on 10/12/2014 (last 04/25/2015).  Folate was normal. Her diet is modest. She has never had a colonoscopy. She denies any melena or hematochezia.  She takes 1 iron pill a day (additional iron causes diarrhea).  Ferritin was 116 on 04/25/2015.  Chest wall biopsy x 3 (lateral, medial, and middle sites) on 11/08/2014 revealed dermal lymphatic involvement by invasive mammary carcinoma with micropapillary features.    She has aggressive disease with growth despite several chemotherapeutic agents.  She received 5 weeks of Taxol (11/03/2014 - 12/01/2014).  She received 1 week of adriamycin (12/14/2014).  She received 1 cycle of carboplatin (12/28/2014).  After carboplatin, disease appeared to stabilize, but with 2 weeks off of therapy, her disease grew again (medial lesion larger and weaping).   She received 3 cycle of carboplatin and Taxol (01/18/2015  - 03/02/2015).  Chest wall lesions initially decreased then began to grow.  She received 2 cycle of adriamycin and Cytoxan (03/26/2015 - 04/11/2015) with Neulasta support.  ANC nadir was 200 on day 9 (04/03/2015).  Cycle #2 was complicated by mouth sores, decreased oral intake, and weight loss. Chest wall disease began to grow before cycle #3 could be administered.  PET scan on 04/26/2015 revealed extensive recurrence about the right chest.  The far lateral component measured 4.2 x 2.2 cm (S.U.V.13.5).  A portion superficial to the right-side of the sternum measured 5.2 x 2.2 cm (S.U.V. 9.1).  There was diffuse more inferior and lateral chest wall soft tissue thickening and hypermetabolism.  CA27.29 was 118.4 on 05/02/2015.  Symptomatically, she denies any symptoms except for her chest wall.  She denies pain.  Chest wall lesions continue to grow.  She is scheduled to begin radiation next week.  She has a history of hypokalemia on oral supplimentation.  Plan: 1.  Review treatment plan with radiation and concurrent carboplatin weekly.  Side effects of treatment reviewed. 2.  Follow-up pending PDL-L1 testing and androgen receptor testing on tumor. 3.  Continue potassium chloride 10 meq po q day. 4.  Anticipate next B12 on 05/23/2015. 5.  RTC next week for MD assess, labs (CBC with diff, CMP), and week #1 of concurrent carboplatin and radiation.   [name], MD  05/09/2015

## 2015-05-14 ENCOUNTER — Inpatient Hospital Stay (HOSPITAL_BASED_OUTPATIENT_CLINIC_OR_DEPARTMENT_OTHER): Payer: PPO | Admitting: Hematology and Oncology

## 2015-05-14 ENCOUNTER — Inpatient Hospital Stay: Payer: PPO

## 2015-05-14 ENCOUNTER — Encounter: Payer: Self-pay | Admitting: Hematology and Oncology

## 2015-05-14 ENCOUNTER — Other Ambulatory Visit: Payer: Self-pay | Admitting: Hematology and Oncology

## 2015-05-14 ENCOUNTER — Other Ambulatory Visit: Payer: Self-pay

## 2015-05-14 ENCOUNTER — Ambulatory Visit
Admission: RE | Admit: 2015-05-14 | Discharge: 2015-05-14 | Disposition: A | Discharge status: Home / Self Care | Payer: PPO | Source: Ambulatory Visit | Attending: Radiation Oncology | Admitting: Radiation Oncology

## 2015-05-14 VITALS — BP 124/82 | HR 90 | Temp 95.3°F | Resp 18 | Ht 60.0 in | Wt 108.6 lb

## 2015-05-14 DIAGNOSIS — C50911 Malignant neoplasm of unspecified site of right female breast: Secondary | ICD-10-CM

## 2015-05-14 DIAGNOSIS — Z7982 Long term (current) use of aspirin: Secondary | ICD-10-CM

## 2015-05-14 DIAGNOSIS — I10 Essential (primary) hypertension: Secondary | ICD-10-CM

## 2015-05-14 DIAGNOSIS — C779 Secondary and unspecified malignant neoplasm of lymph node, unspecified: Secondary | ICD-10-CM

## 2015-05-14 DIAGNOSIS — Z510 Encounter for antineoplastic radiation therapy: Secondary | ICD-10-CM | POA: Diagnosis not present

## 2015-05-14 DIAGNOSIS — E538 Deficiency of other specified B group vitamins: Secondary | ICD-10-CM

## 2015-05-14 DIAGNOSIS — G893 Neoplasm related pain (acute) (chronic): Secondary | ICD-10-CM

## 2015-05-14 DIAGNOSIS — R21 Rash and other nonspecific skin eruption: Secondary | ICD-10-CM

## 2015-05-14 DIAGNOSIS — Z171 Estrogen receptor negative status [ER-]: Secondary | ICD-10-CM | POA: Diagnosis not present

## 2015-05-14 DIAGNOSIS — E876 Hypokalemia: Secondary | ICD-10-CM

## 2015-05-14 DIAGNOSIS — C7989 Secondary malignant neoplasm of other specified sites: Secondary | ICD-10-CM

## 2015-05-14 DIAGNOSIS — D509 Iron deficiency anemia, unspecified: Secondary | ICD-10-CM

## 2015-05-14 DIAGNOSIS — Z79899 Other long term (current) drug therapy: Secondary | ICD-10-CM

## 2015-05-14 DIAGNOSIS — Z87442 Personal history of urinary calculi: Secondary | ICD-10-CM

## 2015-05-14 DIAGNOSIS — Z9221 Personal history of antineoplastic chemotherapy: Secondary | ICD-10-CM

## 2015-05-14 DIAGNOSIS — Z9011 Acquired absence of right breast and nipple: Secondary | ICD-10-CM

## 2015-05-14 DIAGNOSIS — M129 Arthropathy, unspecified: Secondary | ICD-10-CM

## 2015-05-14 DIAGNOSIS — Z7984 Long term (current) use of oral hypoglycemic drugs: Secondary | ICD-10-CM

## 2015-05-14 DIAGNOSIS — I2510 Atherosclerotic heart disease of native coronary artery without angina pectoris: Secondary | ICD-10-CM

## 2015-05-14 DIAGNOSIS — K121 Other forms of stomatitis: Secondary | ICD-10-CM

## 2015-05-14 DIAGNOSIS — C801 Malignant (primary) neoplasm, unspecified: Secondary | ICD-10-CM

## 2015-05-14 DIAGNOSIS — E785 Hyperlipidemia, unspecified: Secondary | ICD-10-CM

## 2015-05-14 DIAGNOSIS — E119 Type 2 diabetes mellitus without complications: Secondary | ICD-10-CM

## 2015-05-14 LAB — CBC WITH DIFFERENTIAL/PLATELET
Basophils Absolute: 0 10*3/uL (ref 0–0.1)
Basophils Relative: 1 %
Eosinophils Absolute: 0 10*3/uL (ref 0–0.7)
Eosinophils Relative: 0 %
HCT: 32 % — ABNORMAL LOW (ref 35.0–47.0)
Hemoglobin: 10.6 g/dL — ABNORMAL LOW (ref 12.0–16.0)
Lymphocytes Relative: 12 %
Lymphs Abs: 1 10*3/uL (ref 1.0–3.6)
MCH: 31.5 pg (ref 26.0–34.0)
MCHC: 33.1 g/dL (ref 32.0–36.0)
MCV: 95.2 fL (ref 80.0–100.0)
Monocytes Absolute: 1 10*3/uL — ABNORMAL HIGH (ref 0.2–0.9)
Monocytes Relative: 12 %
Neutro Abs: 6.3 10*3/uL (ref 1.4–6.5)
Neutrophils Relative %: 75 %
Platelets: 287 10*3/uL (ref 150–440)
RBC: 3.36 MIL/uL — ABNORMAL LOW (ref 3.80–5.20)
RDW: 20.4 % — ABNORMAL HIGH (ref 11.5–14.5)
WBC: 8.4 10*3/uL (ref 3.6–11.0)

## 2015-05-14 LAB — COMPREHENSIVE METABOLIC PANEL
ALT: 13 U/L — ABNORMAL LOW (ref 14–54)
AST: 20 U/L (ref 15–41)
Albumin: 4.2 g/dL (ref 3.5–5.0)
Alkaline Phosphatase: 53 U/L (ref 38–126)
Anion gap: 10 (ref 5–15)
BUN: 19 mg/dL (ref 6–20)
CO2: 23 mmol/L (ref 22–32)
Calcium: 9.2 mg/dL (ref 8.9–10.3)
Chloride: 101 mmol/L (ref 101–111)
Creatinine, Ser: 0.54 mg/dL (ref 0.44–1.00)
GFR calc Af Amer: >60 mL/min (ref >60)
GFR calc non Af Amer: >60 mL/min (ref >60)
Glucose, Bld: 147 mg/dL — ABNORMAL HIGH (ref 65–99)
Potassium: 3.6 mmol/L (ref 3.5–5.1)
Sodium: 134 mmol/L — ABNORMAL LOW (ref 135–145)
Total Bilirubin: 0.5 mg/dL (ref 0.3–1.2)
Total Protein: 6.9 g/dL (ref 6.5–8.1)

## 2015-05-14 LAB — MAGNESIUM: Magnesium: 1.8 mg/dL (ref 1.7–2.4)

## 2015-05-14 MED ORDER — POTASSIUM CHLORIDE CRYS ER 10 MEQ PO TBCR: 10.0000 meq | EXTENDED_RELEASE_TABLET | Freq: Every day | ORAL | Status: DC

## 2015-05-14 MED ORDER — HEPARIN SOD (PORK) LOCK FLUSH 100 UNIT/ML IV SOLN
500.0000 [IU] | Freq: Once | INTRAVENOUS | Status: AC
  Filled 2015-05-14: qty 5

## 2015-05-14 MED ORDER — DEXAMETHASONE SODIUM PHOSPHATE 100 MG/10ML IJ SOLN
10.0000 mg | Freq: Once | INTRAMUSCULAR | Status: AC
  Administered 2015-05-14: 10 mg via INTRAVENOUS
  Filled 2015-05-14: qty 1

## 2015-05-14 MED ORDER — HYDROCODONE-ACETAMINOPHEN 5-325 MG PO TABS: 1.0000 | ORAL_TABLET | Freq: Four times a day (QID) | ORAL | Status: DC | PRN

## 2015-05-14 MED ORDER — PALONOSETRON HCL INJECTION 0.25 MG/5ML
0.2500 mg | Freq: Once | INTRAVENOUS | Status: AC
  Administered 2015-05-14: 0.25 mg via INTRAVENOUS
  Filled 2015-05-14: qty 5

## 2015-05-14 MED ORDER — SODIUM CHLORIDE 0.9 % IJ SOLN
10.0000 mL | Freq: Once | INTRAMUSCULAR | Status: AC
  Administered 2015-05-14: 10 mL via INTRAVENOUS
  Filled 2015-05-14: qty 10

## 2015-05-14 MED ORDER — SODIUM CHLORIDE 0.9 % IV SOLN
123.0000 mg | Freq: Once | INTRAVENOUS | Status: AC
  Administered 2015-05-14: 120 mg via INTRAVENOUS
  Filled 2015-05-14: qty 12

## 2015-05-14 MED ORDER — HEPARIN SOD (PORK) LOCK FLUSH 100 UNIT/ML IV SOLN
500.0000 [IU] | Freq: Once | INTRAVENOUS | Status: AC | PRN
  Administered 2015-05-14: 500 [IU]

## 2015-05-14 MED ORDER — SODIUM CHLORIDE 0.9 % IV SOLN
Freq: Once | INTRAVENOUS | Status: AC
  Administered 2015-05-14: 15:00:00 via INTRAVENOUS
  Filled 2015-05-14: qty 1000

## 2015-05-14 NOTE — Progress Notes (Signed)
[facility] day:  05/14/2015   Chief Complaint: Dawn Wiley is an 81 y.o. female with stage IIIC right breast cancer who is seen for assessment prior to initiation of weekly carboplatin and radiation.  HPI: The patient was last seen in the medical oncology clinic on 05/09/2015.  At that time, discussions were held regarding initiation of concurrent chemotherapy and radiation.  She completed radiation simulation and received her first fraction of radiation today.  Symptomatically, she notes an explosion of tumor growth on her chest wall.  She has a little bit of discomfort (ache).  She has not needed any pain medications.  The rash or "something" on her right arm is present.  She denies any other symptoms.  Past Medical History  Diagnosis Date  . Kidney stones   . Hyperlipidemia   . Coronary artery disease     Coronary calcifications noted on CT scan  . Diabetes mellitus without complication ([place])   . Nephrolithiasis   . Hypertension   . Arthritis   . Anemia   . Tachycardia     Dr. Fletcher Anon, cardiologist  . Cancer [facility])     breast (right)  . PONV (postoperative nausea and vomiting)   . Rash     back  . Breast cancer [facility])     Past Surgical History  Procedure Laterality Date  . Temporomandibular joint surgery    . Cataract extraction      bilateral  . Breast surgery      breast biopsy X 3  . Eye surgery      bilateral cataract extraction  . Mastectomy modified radical Right 08/31/2014    Procedure: MASTECTOMY MODIFIED RADICAL;  Surgeon: Molly Maduro, MD;  Location: [facility] ORS;  Service: General;  Laterality: Right;  . Portacath placement Left 10/20/2014    Procedure: INSERTION PORT-A-CATH;  Surgeon: Marlyce Huge, MD;  Location: [facility] ORS;  Service: General;  Laterality: Left;    Family History  Problem Relation Age of Onset  . Peripheral Artery Disease Sister     carotid artery stenosis   . Heart disease Sister    . Diabetes Sister     Social History:  reports that she has never smoked. She has never used smokeless tobacco. She reports that she does not drink alcohol or use illicit drugs.  The patient is accompanied by her daughter today.  Her phone number is [phone].  Allergies: No Known Allergies  Current Medications: Current Outpatient Prescriptions  Medication Sig Dispense Refill  . acetaminophen (TYLENOL) 500 MG tablet Take 500 mg by mouth every 6 (six) hours as needed for mild pain or moderate pain.     [name] Kitchen amLODipine (NORVASC) 5 MG tablet take 1 tablet by mouth once daily 90 tablet 1  . aspirin 81 MG tablet Take 81 mg by mouth every other day.    . Calcium Carbonate (CALTRATE 600 PO) Take 1,200 mg by mouth daily.     . cholecalciferol (VITAMIN D) 400 UNITS TABS tablet Take 800 Units by mouth.    . Cyanocobalamin (VITAMIN B-12 IJ) Inject as directed every 30 (thirty) days.     . diphenhydrAMINE (BENADRYL) 25 mg capsule Take 25 mg by mouth every 6 (six) hours as needed for allergies.    . ferrous sulfate 325 (65 FE) MG tablet Take 325 mg by mouth daily with breakfast.     . HYDROcodone-acetaminophen (NORCO/VICODIN) 5-325 MG tablet Take 1 tablet by mouth every 6 (six) hours  as needed for moderate pain. 20 tablet 0  . lidocaine-prilocaine (EMLA) cream Apply 1 application topically as needed. Apply 1-2 hours prior to chemotherapy 30 g 1  . loratadine (CLARITIN) 10 MG tablet Take 1 tablet (10 mg total) by mouth daily. 30 tablet 0  . magic mouthwash SOLN Take 5 mLs by mouth 4 (four) times daily. 200 mL 1  . metFORMIN (GLUCOPHAGE) 500 MG tablet Take 1 tablet (500 mg total) by mouth 2 (two) times daily with a meal. 180 tablet 1  . metoprolol tartrate (LOPRESSOR) 25 MG tablet Take 1 tablet (25 mg total) by mouth 2 (two) times daily. 180 tablet 2  . ondansetron (ZOFRAN) 8 MG tablet take 1 tablet by mouth twice a day if needed .[name] KitchenMarland KitchenSTART ON THE THIRD DAY AFTER CHEMOTHERAPY  0  . oxyCODONE-acetaminophen  (PERCOCET/ROXICET) 5-325 MG per tablet Take 1 tablet by mouth every 4 (four) hours as needed for severe pain. 20 tablet 0  . potassium chloride (K-DUR,KLOR-CON) 10 MEQ tablet daily  0  . temazepam (RESTORIL) 7.5 MG capsule Take 1 capsule (7.5 mg total) by mouth at bedtime as needed for sleep. 20 capsule 0   No current facility-administered medications for this visit.   Facility-Administered Medications Ordered in Other Visits  Medication Dose Route Frequency Provider Last Rate Last Dose  . heparin lock flush 100 unit/mL  500 Units Intravenous Once [name], MD      . sodium chloride 0.9 % injection 10 mL  10 mL Intravenous PRN [name], MD      . sodium chloride 0.9 % injection 10 mL  10 mL Intravenous PRN [name], MD   10 mL at 03/21/15 0849  . sodium chloride 0.9 % injection 10 mL  10 mL Intracatheter PRN [name], MD   10 mL at 04/25/15 0998    Review of Systems:  GENERAL: Feels "ok". No fevers or sweats. Weight stable. PERFORMANCE STATUS (ECOG): 1 HEENT: No visual changes, runny nose, sore throat, or tenderness. Lungs: No shortness of breath or cough. No hemoptysis. Cardiac: No chest pain, palpitations, orthopnea, or PND. GI: No nausea, vomiting, diarrhea, constipation, melena or hematochezia. GU: No urgency, frequency, dysuria, or hematuria. Musculoskeletal: No back pain. No joint pain. No muscle tenderness. Extremities: No pain or swelling. Skin: Chest wall lesions, growing a lot. "Something" on right arm. Neuro: No headache, numbness or weakness, balance or coordination issues. Endocrine: Diabetes.  No thyroid issues, hot flashes or night sweats. Psych: No mood changes, depression or anxiety. Pain: No focal pain. Review of systems: All other systems reviewed and found to be negative.  Physical Exam: Blood pressure 124/82, pulse 90, temperature 95.3 F (35.2 C), temperature source Tympanic, resp. rate 18, height 5'  (1.524 m), weight 108 lb 9.2 oz (49.25 kg). GENERAL: Thin elderly woman sitting comfortably in the exam room in no acute distress. MENTAL STATUS: Alert and oriented to person, place and time. HEAD: Short silver wig. Normocephalic, atraumatic, face symmetric, no Cushingoid features. EYES: Blue eyes. Pupils equal round and reactive to light and accomodation.  No conjunctivitis or scleral icterus. ENT:  Oropharynx clear without lesion.  Tongue normal. Mucous membranes moist.  RESPIRATORY:  Clear to auscultation without rales, wheezes or rhonchi. CARDIOVASCULAR:  Regular rate and rhythm without murmur, rub or gallop. BREAST: Right mastectomy. Breast lesions dry.  Nodular lesion measuring 8 x 6.5 cm medially (bulbous).  Confluent nodular ruddy lesions extending laterally under axillae (16 cm x 6 cm tapering  laterally).  Skin changes extending 8.5 cm superiorly from mastectomy incision.  Medial to lateral skin changes approximately 23.5 cm. ABDOMEN:  Soft, non-tender, with active bowel sounds, and no hepatosplenomegaly.  No masses. SKIN: Chest wall lesions as above.  Upper right arm with pink flat area with cental clearing appears like a pinch (dry). EXTREMITIES: No edema, no skin discoloration or tenderness. No palpable cords. LYMPH NODES: No palpable axillary adenopathy  NEUROLOGICAL: Unremarkable. PSYCH: Appropriate.   Infusion on 05/14/2015  Component Date Value Ref Range Status  . WBC 05/14/2015 8.4  3.6 - 11.0 K/uL Final  . RBC 05/14/2015 3.36* 3.80 - 5.20 MIL/uL Final  . Hemoglobin 05/14/2015 10.6* 12.0 - 16.0 g/dL Final  . HCT 05/14/2015 32.0* 35.0 - 47.0 % Final  . MCV 05/14/2015 95.2  80.0 - 100.0 fL Final  . MCH 05/14/2015 31.5  26.0 - 34.0 pg Final  . MCHC 05/14/2015 33.1  32.0 - 36.0 g/dL Final  . RDW 05/14/2015 20.4* 11.5 - 14.5 % Final  . Platelets 05/14/2015 287  150 - 440 K/uL Final  . Neutrophils Relative % 05/14/2015 75   Final  . Neutro Abs 05/14/2015 6.3  1.4 -  6.5 K/uL Final  . Lymphocytes Relative 05/14/2015 12   Final  . Lymphs Abs 05/14/2015 1.0  1.0 - 3.6 K/uL Final  . Monocytes Relative 05/14/2015 12   Final  . Monocytes Absolute 05/14/2015 1.0* 0.2 - 0.9 K/uL Final  . Eosinophils Relative 05/14/2015 0   Final  . Eosinophils Absolute 05/14/2015 0.0  0 - 0.7 K/uL Final  . Basophils Relative 05/14/2015 1   Final  . Basophils Absolute 05/14/2015 0.0  0 - 0.1 K/uL Final  . Magnesium 05/14/2015 1.8  1.7 - 2.4 mg/dL Final  . Sodium 05/14/2015 134* 135 - 145 mmol/L Final  . Potassium 05/14/2015 3.6  3.5 - 5.1 mmol/L Final  . Chloride 05/14/2015 101  101 - 111 mmol/L Final  . CO2 05/14/2015 23  22 - 32 mmol/L Final  . Glucose, Bld 05/14/2015 147* 65 - 99 mg/dL Final  . BUN 05/14/2015 19  6 - 20 mg/dL Final  . Creatinine, Ser 05/14/2015 0.54  0.44 - 1.00 mg/dL Final  . Calcium 05/14/2015 9.2  8.9 - 10.3 mg/dL Final  . Total Protein 05/14/2015 6.9  6.5 - 8.1 g/dL Final  . Albumin 05/14/2015 4.2  3.5 - 5.0 g/dL Final  . AST 05/14/2015 20  15 - 41 U/L Final  . ALT 05/14/2015 13* 14 - 54 U/L Final  . Alkaline Phosphatase 05/14/2015 53  38 - 126 U/L Final  . Total Bilirubin 05/14/2015 0.5  0.3 - 1.2 mg/dL Final  . GFR calc non Af Amer 05/14/2015 >60  >60 mL/min Final  . GFR calc Af Amer 05/14/2015 >60  >60 mL/min Final   Comment: (NOTE) The eGFR has been calculated using the CKD EPI equation. This calculation has not been validated in all clinical situations. eGFR's persistently <60 mL/min signify possible Chronic Kidney Disease.   . Anion gap 05/14/2015 10  5 - 15 Final    Assessment:  DIMPLE BASTYR is an 81 y.o. female with stage IIIC right breast cancer status post mastectomy and axillary lymph node dissection on 08/31/2014. 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 Carcinoma involved skeletal muscle and dermal lymphatics. Tumor was less than 0.5 mm from the deep margin.  Thirteen of 15 lymph nodes were involved. The largest metastatic focus was 2  cm. Tumor is triple negative (ER negative, PR negative, and HER-2/neu negative).  Tumor is androgen receptor negative.  Pathologic stage was [id number].  PET scan on 09/21/2014 revealed postoperative changes in the right chest with no suspicious findings or residual tumor. Bone scan on 10/11/2014 revealed no evidence of metastatic disease.  Bone density study on 10/02/2014 revealed a T score of -4.7 in the forearm consistent with osteoporosis. T-score was less than 2.5 in the spine or hip. Echocardiogram on 10/02/2014 revealed ejection fraction of 60-65%.  She has iron deficiency anemia. Labs on 09/19/2014 revealed a hematocrit of 29.9, hemoglobin 8.7, MCV 69.7, ferritin 9, and TIBC 616. B12 was low (265) with a prior history of B12 deficiency and need for supplementation (stopped 06/2014). She began B12 on 10/12/2014 (last 04/25/2015).  Folate was normal. Her diet is modest. She has never had a colonoscopy. She denies any melena or hematochezia.  She takes 1 iron pill a day (additional iron causes diarrhea).  Ferritin was 116 on 04/25/2015.  Chest wall biopsy x 3 (lateral, medial, and middle sites) on 11/08/2014 revealed dermal lymphatic involvement by invasive mammary carcinoma with micropapillary features.    She has aggressive disease with growth despite several chemotherapeutic agents.  She received 5 weeks of Taxol (11/03/2014 - 12/01/2014).  She received 1 week of adriamycin (12/14/2014).  She received 1 cycle of carboplatin (12/28/2014).  After carboplatin, disease appeared to stabilize, but with 2 weeks off of therapy, her disease grew again (medial lesion larger and weaping).   She received 3 cycles of carboplatin and Taxol (01/18/2015 - 03/02/2015).  Chest wall lesions initially decreased then began to grow.  She received 2 cycles of adriamycin and Cytoxan (03/26/2015 - 04/11/2015) with Neulasta support.  ANC nadir  was 200 on day 9 (04/03/2015).  Cycle #2 was complicated by mouth sores, decreased oral intake, and weight loss. Chest wall disease began to grow before cycle #3 could be administered.  PET scan on 04/26/2015 revealed extensive recurrence about the right chest.  The far lateral component measured 4.2 x 2.2 cm (S.U.V.13.5).  A portion superficial to the right-side of the sternum measured 5.2 x 2.2 cm (S.U.V. 9.1).  There was diffuse more inferior and lateral chest wall soft tissue thickening and hypermetabolism.  CA27.29 was 118.4 on 05/02/2015.  Symptomatically, she has explosive chest wall disease.  She notes some chest wall discomfort.  She started radiation today.  Potassium is normal on supplimentation.  Plan: 1.  Labs today:  CBC with diff, CMP, Mg. 2.  Review treatment plan with weekly carboplatin and radiation.  Side effects reviewed.  Patient consent to treatment. 3.  Week #1 carboplatin today. 4.  Medical photographs. 5.  Follow-up PDL-L1 testing.  Androgen receptor testing negative on tumor. 6.  Rx:  Potassium chloride 10 meq po q day (dis #30 with 1 refill) 7.  Rx:  Hydrocodone 5 mg/acetominophen 325 mg 1 tablet po q 6 hours prn pain (dis: #30). 8.  Anticipate next B12 on 05/23/2015. 9.  Complete paperwork for [facility] testing. 10.  RTC weekly for MD assess, labs (CBC with diff, CMP), and concurrent carboplatin and radiation.   [name], MD  05/14/2015, 5:33 PM

## 2015-05-15 ENCOUNTER — Ambulatory Visit
Admission: RE | Admit: 2015-05-15 | Discharge: 2015-05-15 | Disposition: A | Discharge status: Home / Self Care | Payer: PPO | Source: Ambulatory Visit | Attending: Radiation Oncology | Admitting: Radiation Oncology

## 2015-05-15 DIAGNOSIS — Z510 Encounter for antineoplastic radiation therapy: Secondary | ICD-10-CM | POA: Diagnosis not present

## 2015-05-16 ENCOUNTER — Ambulatory Visit
Admission: RE | Admit: 2015-05-16 | Discharge: 2015-05-16 | Disposition: A | Discharge status: Home / Self Care | Payer: PPO | Source: Ambulatory Visit | Attending: Radiation Oncology | Admitting: Radiation Oncology

## 2015-05-16 DIAGNOSIS — Z510 Encounter for antineoplastic radiation therapy: Secondary | ICD-10-CM | POA: Diagnosis not present

## 2015-05-17 ENCOUNTER — Other Ambulatory Visit: Payer: Self-pay

## 2015-05-17 ENCOUNTER — Ambulatory Visit
Admission: RE | Admit: 2015-05-17 | Discharge: 2015-05-17 | Disposition: A | Discharge status: Home / Self Care | Payer: PPO | Source: Ambulatory Visit | Attending: Radiation Oncology | Admitting: Radiation Oncology

## 2015-05-17 DIAGNOSIS — C50911 Malignant neoplasm of unspecified site of right female breast: Secondary | ICD-10-CM

## 2015-05-17 DIAGNOSIS — Z510 Encounter for antineoplastic radiation therapy: Secondary | ICD-10-CM | POA: Diagnosis not present

## 2015-05-18 ENCOUNTER — Ambulatory Visit
Admission: RE | Admit: 2015-05-18 | Discharge: 2015-05-18 | Disposition: A | Discharge status: Home / Self Care | Payer: PPO | Source: Ambulatory Visit | Attending: Radiation Oncology | Admitting: Radiation Oncology

## 2015-05-18 DIAGNOSIS — Z510 Encounter for antineoplastic radiation therapy: Secondary | ICD-10-CM | POA: Diagnosis not present

## 2015-05-20 ENCOUNTER — Other Ambulatory Visit: Payer: Self-pay | Admitting: Hematology and Oncology

## 2015-05-21 ENCOUNTER — Inpatient Hospital Stay: Payer: PPO

## 2015-05-21 ENCOUNTER — Ambulatory Visit: Payer: PPO | Admitting: Hematology and Oncology

## 2015-05-21 ENCOUNTER — Ambulatory Visit
Admission: RE | Admit: 2015-05-21 | Discharge: 2015-05-21 | Disposition: A | Discharge status: Home / Self Care | Payer: PPO | Source: Ambulatory Visit | Attending: Radiation Oncology | Admitting: Radiation Oncology

## 2015-05-21 ENCOUNTER — Inpatient Hospital Stay (HOSPITAL_BASED_OUTPATIENT_CLINIC_OR_DEPARTMENT_OTHER): Payer: PPO | Admitting: Hematology and Oncology

## 2015-05-21 VITALS — BP 135/73 | HR 97 | Temp 98.0°F | Resp 18 | Ht 60.0 in | Wt 107.4 lb

## 2015-05-21 DIAGNOSIS — E538 Deficiency of other specified B group vitamins: Secondary | ICD-10-CM

## 2015-05-21 DIAGNOSIS — C50911 Malignant neoplasm of unspecified site of right female breast: Secondary | ICD-10-CM

## 2015-05-21 DIAGNOSIS — E119 Type 2 diabetes mellitus without complications: Secondary | ICD-10-CM

## 2015-05-21 DIAGNOSIS — R21 Rash and other nonspecific skin eruption: Secondary | ICD-10-CM

## 2015-05-21 DIAGNOSIS — C779 Secondary and unspecified malignant neoplasm of lymph node, unspecified: Secondary | ICD-10-CM

## 2015-05-21 DIAGNOSIS — I2510 Atherosclerotic heart disease of native coronary artery without angina pectoris: Secondary | ICD-10-CM

## 2015-05-21 DIAGNOSIS — Z171 Estrogen receptor negative status [ER-]: Secondary | ICD-10-CM

## 2015-05-21 DIAGNOSIS — K121 Other forms of stomatitis: Secondary | ICD-10-CM

## 2015-05-21 DIAGNOSIS — D509 Iron deficiency anemia, unspecified: Secondary | ICD-10-CM

## 2015-05-21 DIAGNOSIS — G893 Neoplasm related pain (acute) (chronic): Secondary | ICD-10-CM

## 2015-05-21 DIAGNOSIS — Z9011 Acquired absence of right breast and nipple: Secondary | ICD-10-CM

## 2015-05-21 DIAGNOSIS — I10 Essential (primary) hypertension: Secondary | ICD-10-CM

## 2015-05-21 DIAGNOSIS — Z7982 Long term (current) use of aspirin: Secondary | ICD-10-CM

## 2015-05-21 DIAGNOSIS — C7989 Secondary malignant neoplasm of other specified sites: Secondary | ICD-10-CM | POA: Diagnosis not present

## 2015-05-21 DIAGNOSIS — E876 Hypokalemia: Secondary | ICD-10-CM

## 2015-05-21 DIAGNOSIS — Z9221 Personal history of antineoplastic chemotherapy: Secondary | ICD-10-CM

## 2015-05-21 DIAGNOSIS — Z7984 Long term (current) use of oral hypoglycemic drugs: Secondary | ICD-10-CM

## 2015-05-21 DIAGNOSIS — Z510 Encounter for antineoplastic radiation therapy: Secondary | ICD-10-CM | POA: Diagnosis not present

## 2015-05-21 DIAGNOSIS — G4700 Insomnia, unspecified: Secondary | ICD-10-CM

## 2015-05-21 DIAGNOSIS — Z79899 Other long term (current) drug therapy: Secondary | ICD-10-CM

## 2015-05-21 DIAGNOSIS — M129 Arthropathy, unspecified: Secondary | ICD-10-CM

## 2015-05-21 DIAGNOSIS — E785 Hyperlipidemia, unspecified: Secondary | ICD-10-CM

## 2015-05-21 DIAGNOSIS — Z87442 Personal history of urinary calculi: Secondary | ICD-10-CM

## 2015-05-21 LAB — CBC WITH DIFFERENTIAL/PLATELET
Basophils Absolute: 0 10*3/uL (ref 0–0.1)
Basophils Relative: 1 %
Eosinophils Absolute: 0.1 10*3/uL (ref 0–0.7)
Eosinophils Relative: 1 %
HCT: 34.1 % — ABNORMAL LOW (ref 35.0–47.0)
Hemoglobin: 11 g/dL — ABNORMAL LOW (ref 12.0–16.0)
Lymphocytes Relative: 12 %
Lymphs Abs: 0.8 10*3/uL — ABNORMAL LOW (ref 1.0–3.6)
MCH: 31.1 pg (ref 26.0–34.0)
MCHC: 32.4 g/dL (ref 32.0–36.0)
MCV: 96 fL (ref 80.0–100.0)
Monocytes Absolute: 0.7 10*3/uL (ref 0.2–0.9)
Monocytes Relative: 10 %
Neutro Abs: 5.1 10*3/uL (ref 1.4–6.5)
Neutrophils Relative %: 76 %
Platelets: 235 10*3/uL (ref 150–440)
RBC: 3.56 MIL/uL — ABNORMAL LOW (ref 3.80–5.20)
RDW: 18.7 % — ABNORMAL HIGH (ref 11.5–14.5)
WBC: 6.7 10*3/uL (ref 3.6–11.0)

## 2015-05-21 LAB — COMPREHENSIVE METABOLIC PANEL
ALT: 18 U/L (ref 14–54)
AST: 22 U/L (ref 15–41)
Albumin: 4.2 g/dL (ref 3.5–5.0)
Alkaline Phosphatase: 62 U/L (ref 38–126)
Anion gap: 12 (ref 5–15)
BUN: 18 mg/dL (ref 6–20)
CO2: 22 mmol/L (ref 22–32)
Calcium: 9.4 mg/dL (ref 8.9–10.3)
Chloride: 100 mmol/L — ABNORMAL LOW (ref 101–111)
Creatinine, Ser: 0.43 mg/dL — ABNORMAL LOW (ref 0.44–1.00)
GFR calc Af Amer: >60 mL/min (ref >60)
GFR calc non Af Amer: >60 mL/min (ref >60)
Glucose, Bld: 156 mg/dL — ABNORMAL HIGH (ref 65–99)
Potassium: 3.8 mmol/L (ref 3.5–5.1)
Sodium: 134 mmol/L — ABNORMAL LOW (ref 135–145)
Total Bilirubin: 0.4 mg/dL (ref 0.3–1.2)
Total Protein: 7 g/dL (ref 6.5–8.1)

## 2015-05-21 MED ORDER — HEPARIN SOD (PORK) LOCK FLUSH 100 UNIT/ML IV SOLN
500.0000 [IU] | Freq: Once | INTRAVENOUS | Status: AC | PRN
  Administered 2015-05-21: 500 [IU]

## 2015-05-21 MED ORDER — SODIUM CHLORIDE 0.9 % IJ SOLN
10.0000 mL | Freq: Once | INTRAMUSCULAR | Status: AC
  Administered 2015-05-21: 10 mL via INTRAVENOUS
  Filled 2015-05-21: qty 10

## 2015-05-21 MED ORDER — SODIUM CHLORIDE 0.9 % IV SOLN
Freq: Once | INTRAVENOUS | Status: AC
  Administered 2015-05-21: 15:00:00 via INTRAVENOUS
  Filled 2015-05-21: qty 1000

## 2015-05-21 MED ORDER — SODIUM CHLORIDE 0.9 % IV SOLN
123.0000 mg | Freq: Once | INTRAVENOUS | Status: AC
  Administered 2015-05-21: 120 mg via INTRAVENOUS
  Filled 2015-05-21: qty 12

## 2015-05-21 MED ORDER — PALONOSETRON HCL INJECTION 0.25 MG/5ML
0.2500 mg | Freq: Once | INTRAVENOUS | Status: AC
  Administered 2015-05-21: 0.25 mg via INTRAVENOUS
  Filled 2015-05-21: qty 5

## 2015-05-21 MED ORDER — HEPARIN SOD (PORK) LOCK FLUSH 100 UNIT/ML IV SOLN
500.0000 [IU] | Freq: Once | INTRAVENOUS | Status: AC
  Filled 2015-05-21: qty 5

## 2015-05-21 MED ORDER — SODIUM CHLORIDE 0.9 % IV SOLN
10.0000 mg | Freq: Once | INTRAVENOUS | Status: AC
  Administered 2015-05-21: 10 mg via INTRAVENOUS
  Filled 2015-05-21: qty 1

## 2015-05-21 NOTE — Progress Notes (Signed)
[facility] day:  05/21/2015   Chief Complaint: Dawn Wiley is an 81 y.o. female with stage IIIC right breast cancer who is seen for assessment prior to week #2 carboplatin and radiation.  HPI: The patient was last seen in the medical oncology clinic on 05/14/2015.  At that time, she began concurrent chemotherapy and radiation.  Prior to initiation of therapy, she noted an explosion of tumor growth on her chest wall.  She had a little bit of discomfort (ache).  She denied any other symptoms.  She received her chemotherapy uneventfully.  She was given a prescription for Lortab 5/325 if needed.  During the interim, she has continued her daily radiation (Monday-Friday).  She notes no problems. She states "can't say I have had pain".  Chest wall is "a little itchy".  She is busy.  Her energy level is good.  She has trouble sleeping.  She did take a sleeping pill one night.  Past Medical History  Diagnosis Date  . Kidney stones   . Hyperlipidemia   . Coronary artery disease     Coronary calcifications noted on CT scan  . Diabetes mellitus without complication ([place])   . Nephrolithiasis   . Hypertension   . Arthritis   . Anemia   . Tachycardia     Dr. Fletcher Anon, cardiologist  . Cancer [facility])     breast (right)  . PONV (postoperative nausea and vomiting)   . Rash     back  . Breast cancer [facility])     Past Surgical History  Procedure Laterality Date  . Temporomandibular joint surgery    . Cataract extraction      bilateral  . Breast surgery      breast biopsy X 3  . Eye surgery      bilateral cataract extraction  . Mastectomy modified radical Right 08/31/2014    Procedure: MASTECTOMY MODIFIED RADICAL;  Surgeon: Molly Maduro, MD;  Location: [facility] ORS;  Service: General;  Laterality: Right;  . Portacath placement Left 10/20/2014    Procedure: INSERTION PORT-A-CATH;  Surgeon: Marlyce Huge, MD;  Location: [facility] ORS;  Service: General;   Laterality: Left;    Family History  Problem Relation Age of Onset  . Peripheral Artery Disease Sister     carotid artery stenosis   . Heart disease Sister   . Diabetes Sister     Social History:  reports that she has never smoked. She has never used smokeless tobacco. She reports that she does not drink alcohol or use illicit drugs.  The patient is alone today.  Her daughter's phone number is [phone].  Allergies: No Known Allergies  Current Medications: Current Outpatient Prescriptions  Medication Sig Dispense Refill  . acetaminophen (TYLENOL) 500 MG tablet Take 500 mg by mouth every 6 (six) hours as needed for mild pain or moderate pain.     [name] Kitchen amLODipine (NORVASC) 5 MG tablet take 1 tablet by mouth once daily 90 tablet 1  . aspirin 81 MG tablet Take 81 mg by mouth every other day.    . Calcium Carbonate (CALTRATE 600 PO) Take 1,200 mg by mouth daily.     . cholecalciferol (VITAMIN D) 400 UNITS TABS tablet Take 800 Units by mouth.    . Cyanocobalamin (VITAMIN B-12 IJ) Inject as directed every 30 (thirty) days.     . diphenhydrAMINE (BENADRYL) 25 mg capsule Take 25 mg by mouth every 6 (six) hours as needed for allergies.    [name] Kitchen  ferrous sulfate 325 (65 FE) MG tablet Take 325 mg by mouth daily with breakfast.     . HYDROcodone-acetaminophen (NORCO/VICODIN) 5-325 MG tablet Take 1 tablet by mouth every 6 (six) hours as needed for moderate pain. 30 tablet 0  . lidocaine-prilocaine (EMLA) cream Apply 1 application topically as needed. Apply 1-2 hours prior to chemotherapy 30 g 1  . loratadine (CLARITIN) 10 MG tablet Take 1 tablet (10 mg total) by mouth daily. 30 tablet 0  . magic mouthwash SOLN Take 5 mLs by mouth 4 (four) times daily. 200 mL 1  . metFORMIN (GLUCOPHAGE) 500 MG tablet Take 1 tablet (500 mg total) by mouth 2 (two) times daily with a meal. 180 tablet 1  . metoprolol tartrate (LOPRESSOR) 25 MG tablet Take 1 tablet (25 mg total) by mouth 2 (two) times daily. 180 tablet 2  .  ondansetron (ZOFRAN) 8 MG tablet take 1 tablet by mouth twice a day if needed .[name] KitchenMarland KitchenSTART ON THE THIRD DAY AFTER CHEMOTHERAPY  0  . oxyCODONE-acetaminophen (PERCOCET/ROXICET) 5-325 MG per tablet Take 1 tablet by mouth every 4 (four) hours as needed for severe pain. 20 tablet 0  . potassium chloride (K-DUR,KLOR-CON) 10 MEQ tablet Take 1 tablet (10 mEq total) by mouth daily. 30 tablet 1  . temazepam (RESTORIL) 7.5 MG capsule Take 1 capsule (7.5 mg total) by mouth at bedtime as needed for sleep. 20 capsule 0   No current facility-administered medications for this visit.   Facility-Administered Medications Ordered in Other Visits  Medication Dose Route Frequency Provider Last Rate Last Dose  . sodium chloride 0.9 % injection 10 mL  10 mL Intravenous PRN [name], MD      . sodium chloride 0.9 % injection 10 mL  10 mL Intravenous PRN [name], MD   10 mL at 03/21/15 0849  . sodium chloride 0.9 % injection 10 mL  10 mL Intracatheter PRN [name], MD   10 mL at 04/25/15 5790    Review of Systems:  GENERAL: No problems. No fevers or sweats. Weight down 1 pound. PERFORMANCE STATUS (ECOG): 1 HEENT: No visual changes, runny nose, sore throat, or tenderness. Lungs: No shortness of breath or cough. No hemoptysis. Cardiac: No chest pain, palpitations, orthopnea, or PND. GI: No nausea, vomiting, diarrhea, constipation, melena or hematochezia. GU: No urgency, frequency, dysuria, or hematuria. Musculoskeletal: No back pain. No joint pain. No muscle tenderness. Extremities: No pain or swelling. Skin: Chest wall lesions. "Something" still on right arm. Neuro: No headache, numbness or weakness, balance or coordination issues. Endocrine: Diabetes.  No thyroid issues, hot flashes or night sweats. Psych: No mood changes, depression or anxiety. Pain: No focal pain. Review of systems: All other systems reviewed and found to be negative.  Physical Exam: Blood  pressure 135/73, pulse 97, temperature 98 F (36.7 C), temperature source Tympanic, resp. rate 18, height 5' (1.524 m), weight 107 lb 5.8 oz (48.7 kg). GENERAL: Thin elderly woman sitting comfortably in the exam room in no acute distress. MENTAL STATUS: Alert and oriented to person, place and time. HEAD: Short silver wig. Normocephalic, atraumatic, face symmetric, no Cushingoid features. EYES: Blue eyes. Pupils equal round and reactive to light and accomodation.  No conjunctivitis or scleral icterus. ENT:  Oropharynx clear without lesion.  Tongue normal. Mucous membranes moist.  RESPIRATORY:  Clear to auscultation without rales, wheezes or rhonchi. CARDIOVASCULAR:  Regular rate and rhythm without murmur, rub or gallop. BREAST: Right mastectomy. Breast lesions dry.  Nodular  lesion measuring 7 x 6 cm medially (dry and less bulbous).  Confluent nodular ruddy lesions extending laterally under axillae (21 cm long x 7 cm high and tapering laterally to 2 cm).  Medial to lateral skin changes approximately 26 cm. ABDOMEN:  Soft, non-tender, with active bowel sounds, and no hepatosplenomegaly.  No masses. SKIN: Chest wall lesions as above.  Upper right arm with pink slightly palpable irregularity. Right far lateral back (near axillae) with 4 x 2 area of faint erythema. EXTREMITIES: No edema, no skin discoloration or tenderness. No palpable cords. LYMPH NODES: No palpable axillary adenopathy  NEUROLOGICAL: Unremarkable. PSYCH: Appropriate.   Infusion on 05/21/2015  Component Date Value Ref Range Status  . WBC 05/21/2015 6.7  3.6 - 11.0 K/uL Final  . RBC 05/21/2015 3.56* 3.80 - 5.20 MIL/uL Final  . Hemoglobin 05/21/2015 11.0* 12.0 - 16.0 g/dL Final  . HCT 05/21/2015 34.1* 35.0 - 47.0 % Final  . MCV 05/21/2015 96.0  80.0 - 100.0 fL Final  . MCH 05/21/2015 31.1  26.0 - 34.0 pg Final  . MCHC 05/21/2015 32.4  32.0 - 36.0 g/dL Final  . RDW 05/21/2015 18.7* 11.5 - 14.5 % Final  . Platelets  05/21/2015 235  150 - 440 K/uL Final  . Neutrophils Relative % 05/21/2015 76   Final  . Neutro Abs 05/21/2015 5.1  1.4 - 6.5 K/uL Final  . Lymphocytes Relative 05/21/2015 12   Final  . Lymphs Abs 05/21/2015 0.8* 1.0 - 3.6 K/uL Final  . Monocytes Relative 05/21/2015 10   Final  . Monocytes Absolute 05/21/2015 0.7  0.2 - 0.9 K/uL Final  . Eosinophils Relative 05/21/2015 1   Final  . Eosinophils Absolute 05/21/2015 0.1  0 - 0.7 K/uL Final  . Basophils Relative 05/21/2015 1   Final  . Basophils Absolute 05/21/2015 0.0  0 - 0.1 K/uL Final  . Sodium 05/21/2015 134* 135 - 145 mmol/L Final  . Potassium 05/21/2015 3.8  3.5 - 5.1 mmol/L Final  . Chloride 05/21/2015 100* 101 - 111 mmol/L Final  . CO2 05/21/2015 22  22 - 32 mmol/L Final  . Glucose, Bld 05/21/2015 156* 65 - 99 mg/dL Final  . BUN 05/21/2015 18  6 - 20 mg/dL Final  . Creatinine, Ser 05/21/2015 0.43* 0.44 - 1.00 mg/dL Final  . Calcium 05/21/2015 9.4  8.9 - 10.3 mg/dL Final  . Total Protein 05/21/2015 7.0  6.5 - 8.1 g/dL Final  . Albumin 05/21/2015 4.2  3.5 - 5.0 g/dL Final  . AST 05/21/2015 22  15 - 41 U/L Final  . ALT 05/21/2015 18  14 - 54 U/L Final  . Alkaline Phosphatase 05/21/2015 62  38 - 126 U/L Final  . Total Bilirubin 05/21/2015 0.4  0.3 - 1.2 mg/dL Final  . GFR calc non Af Amer 05/21/2015 >60  >60 mL/min Final  . GFR calc Af Amer 05/21/2015 >60  >60 mL/min Final   Comment: (NOTE) The eGFR has been calculated using the CKD EPI equation. This calculation has not been validated in all clinical situations. eGFR's persistently <60 mL/min signify possible Chronic Kidney Disease.   . Anion gap 05/21/2015 12  5 - 15 Final    Assessment:  DENNISE RAABE is an 81 y.o. female with stage IIIC right breast cancer status post mastectomy and axillary lymph node dissection on 08/31/2014. 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 Carcinoma involved skeletal muscle and  dermal lymphatics. Tumor was less than 0.5 mm  from the deep margin. Thirteen of 15 lymph nodes were involved. The largest metastatic focus was 2 cm. Tumor is triple negative (ER negative, PR negative, and HER-2/neu negative).  Tumor is androgen receptor negative.  Pathologic stage was [id number].  PET scan on 09/21/2014 revealed postoperative changes in the right chest with no suspicious findings or residual tumor. Bone scan on 10/11/2014 revealed no evidence of metastatic disease.  Bone density study on 10/02/2014 revealed a T score of -4.7 in the forearm consistent with osteoporosis. T-score was less than 2.5 in the spine or hip. Echocardiogram on 10/02/2014 revealed ejection fraction of 60-65%.  She has iron deficiency anemia. Labs on 09/19/2014 revealed a hematocrit of 29.9, hemoglobin 8.7, MCV 69.7, ferritin 9, and TIBC 616. B12 was low (265) with a prior history of B12 deficiency and need for supplementation (stopped 06/2014). She began B12 on 10/12/2014 (last 04/25/2015).  Folate was normal. Her diet is modest. She has never had a colonoscopy. She denies any melena or hematochezia.  She takes 1 iron pill a day (additional iron causes diarrhea).  Ferritin was 116 on 04/25/2015.  Chest wall biopsy x 3 (lateral, medial, and middle sites) on 11/08/2014 revealed dermal lymphatic involvement by invasive mammary carcinoma with micropapillary features.    She has aggressive disease with growth despite several chemotherapeutic agents.  She received 5 weeks of Taxol (11/03/2014 - 12/01/2014).  She received 1 week of adriamycin (12/14/2014).  She received 1 cycle of carboplatin (12/28/2014).  After carboplatin, disease appeared to stabilize, but with 2 weeks off of therapy, her disease grew again (medial lesion larger and weaping).   She received 3 cycles of carboplatin and Taxol (01/18/2015 - 03/02/2015).  Chest wall lesions initially decreased then began to grow.  She received 2 cycles of adriamycin  and Cytoxan (03/26/2015 - 04/11/2015) with Neulasta support.  ANC nadir was 200 on day 9 (04/03/2015).  Cycle #2 was complicated by mouth sores, decreased oral intake, and weight loss. Chest wall disease began to grow before cycle #3 could be administered.  PET scan on 04/26/2015 revealed extensive recurrence about the right chest.  The far lateral component measured 4.2 x 2.2 cm (S.U.V.13.5).  A portion superficial to the right-side of the sternum measured 5.2 x 2.2 cm (S.U.V. 9.1).  There was diffuse more inferior and lateral chest wall soft tissue thickening and hypermetabolism.  CA27.29 was 118.4 on 05/02/2015.  She is week #2 of concurrent weekly carboplatin and radiation (began 05/14/2015).  Symptomatically, she is doing well.  Chest wall lesions appear drier.  The medial lesion is smaller and less bulbous.  There is an area on her medial right upper arm and right lateral back which are slightly raised and worrisome for disease outside of the radiation field.  Potassium is normal.  Plan: 1.  Labs today:  CBC with diff, CMP. 2.  Week #2 carboplatin today. 3.  Medical photographs. 4.  Follow-up PDL-L1 testing.   5.  Continue potassium chloride 10 meq po q day. 6.  B12 on 05/23/2015. 7.  RTC in 1 week for MD assess, labs (CBC with diff, CMP), and week #3 concurrent carboplatin and radiation.   [name], MD  05/21/2015, 1:52 PM

## 2015-05-22 ENCOUNTER — Ambulatory Visit
Admission: RE | Admit: 2015-05-22 | Discharge: 2015-05-22 | Disposition: A | Discharge status: Home / Self Care | Payer: PPO | Source: Ambulatory Visit | Attending: Radiation Oncology | Admitting: Radiation Oncology

## 2015-05-22 DIAGNOSIS — Z510 Encounter for antineoplastic radiation therapy: Secondary | ICD-10-CM | POA: Diagnosis not present

## 2015-05-23 ENCOUNTER — Ambulatory Visit
Admission: RE | Admit: 2015-05-23 | Discharge: 2015-05-23 | Disposition: A | Discharge status: Home / Self Care | Payer: PPO | Source: Ambulatory Visit | Attending: Radiation Oncology | Admitting: Radiation Oncology

## 2015-05-23 ENCOUNTER — Inpatient Hospital Stay: Payer: PPO

## 2015-05-23 DIAGNOSIS — Z510 Encounter for antineoplastic radiation therapy: Secondary | ICD-10-CM | POA: Diagnosis not present

## 2015-05-24 ENCOUNTER — Ambulatory Visit
Admission: RE | Admit: 2015-05-24 | Discharge: 2015-05-24 | Disposition: A | Discharge status: Home / Self Care | Payer: PPO | Source: Ambulatory Visit | Attending: Radiation Oncology | Admitting: Radiation Oncology

## 2015-05-24 DIAGNOSIS — Z510 Encounter for antineoplastic radiation therapy: Secondary | ICD-10-CM | POA: Diagnosis not present

## 2015-05-25 ENCOUNTER — Ambulatory Visit
Admission: RE | Admit: 2015-05-25 | Discharge: 2015-05-25 | Disposition: A | Discharge status: Home / Self Care | Payer: PPO | Source: Ambulatory Visit | Attending: Radiation Oncology | Admitting: Radiation Oncology

## 2015-05-25 DIAGNOSIS — Z510 Encounter for antineoplastic radiation therapy: Secondary | ICD-10-CM | POA: Diagnosis not present

## 2015-05-27 ENCOUNTER — Encounter: Payer: Self-pay | Admitting: Hematology and Oncology

## 2015-05-28 ENCOUNTER — Ambulatory Visit
Admission: RE | Admit: 2015-05-28 | Discharge: 2015-05-28 | Disposition: A | Discharge status: Home / Self Care | Payer: PPO | Source: Ambulatory Visit | Attending: Radiation Oncology | Admitting: Radiation Oncology

## 2015-05-28 ENCOUNTER — Inpatient Hospital Stay: Payer: PPO

## 2015-05-28 ENCOUNTER — Inpatient Hospital Stay: Payer: PPO | Attending: Hematology and Oncology | Admitting: Hematology and Oncology

## 2015-05-28 ENCOUNTER — Other Ambulatory Visit: Payer: Self-pay | Admitting: Hematology and Oncology

## 2015-05-28 VITALS — BP 129/74 | HR 83 | Temp 97.6°F | Resp 18 | Ht 60.0 in | Wt 105.4 lb

## 2015-05-28 DIAGNOSIS — C50911 Malignant neoplasm of unspecified site of right female breast: Secondary | ICD-10-CM | POA: Insufficient documentation

## 2015-05-28 DIAGNOSIS — Z9011 Acquired absence of right breast and nipple: Secondary | ICD-10-CM | POA: Diagnosis not present

## 2015-05-28 DIAGNOSIS — Z7982 Long term (current) use of aspirin: Secondary | ICD-10-CM | POA: Diagnosis not present

## 2015-05-28 DIAGNOSIS — Z87442 Personal history of urinary calculi: Secondary | ICD-10-CM

## 2015-05-28 DIAGNOSIS — I2510 Atherosclerotic heart disease of native coronary artery without angina pectoris: Secondary | ICD-10-CM | POA: Diagnosis not present

## 2015-05-28 DIAGNOSIS — Z510 Encounter for antineoplastic radiation therapy: Secondary | ICD-10-CM | POA: Diagnosis not present

## 2015-05-28 DIAGNOSIS — I10 Essential (primary) hypertension: Secondary | ICD-10-CM | POA: Diagnosis not present

## 2015-05-28 DIAGNOSIS — D509 Iron deficiency anemia, unspecified: Secondary | ICD-10-CM | POA: Diagnosis not present

## 2015-05-28 DIAGNOSIS — Z5111 Encounter for antineoplastic chemotherapy: Secondary | ICD-10-CM | POA: Diagnosis not present

## 2015-05-28 DIAGNOSIS — E119 Type 2 diabetes mellitus without complications: Secondary | ICD-10-CM | POA: Insufficient documentation

## 2015-05-28 DIAGNOSIS — Z923 Personal history of irradiation: Secondary | ICD-10-CM

## 2015-05-28 DIAGNOSIS — Z79899 Other long term (current) drug therapy: Secondary | ICD-10-CM

## 2015-05-28 DIAGNOSIS — Z7984 Long term (current) use of oral hypoglycemic drugs: Secondary | ICD-10-CM | POA: Diagnosis not present

## 2015-05-28 DIAGNOSIS — E538 Deficiency of other specified B group vitamins: Secondary | ICD-10-CM

## 2015-05-28 DIAGNOSIS — Z171 Estrogen receptor negative status [ER-]: Secondary | ICD-10-CM | POA: Diagnosis not present

## 2015-05-28 DIAGNOSIS — E785 Hyperlipidemia, unspecified: Secondary | ICD-10-CM | POA: Diagnosis not present

## 2015-05-28 DIAGNOSIS — M129 Arthropathy, unspecified: Secondary | ICD-10-CM | POA: Insufficient documentation

## 2015-05-28 DIAGNOSIS — Z170 Estrogen receptor positive status [ER+]: Secondary | ICD-10-CM | POA: Insufficient documentation

## 2015-05-28 DIAGNOSIS — R000 Tachycardia, unspecified: Secondary | ICD-10-CM | POA: Diagnosis not present

## 2015-05-28 DIAGNOSIS — R21 Rash and other nonspecific skin eruption: Secondary | ICD-10-CM | POA: Diagnosis not present

## 2015-05-28 LAB — COMPREHENSIVE METABOLIC PANEL
ALT: 13 U/L — ABNORMAL LOW (ref 14–54)
AST: 20 U/L (ref 15–41)
Albumin: 4.1 g/dL (ref 3.5–5.0)
Alkaline Phosphatase: 61 U/L (ref 38–126)
Anion gap: 9 (ref 5–15)
BUN: 20 mg/dL (ref 6–20)
CO2: 24 mmol/L (ref 22–32)
Calcium: 9.4 mg/dL (ref 8.9–10.3)
Chloride: 101 mmol/L (ref 101–111)
Creatinine, Ser: 0.53 mg/dL (ref 0.44–1.00)
GFR calc Af Amer: >60 mL/min (ref >60)
GFR calc non Af Amer: >60 mL/min (ref >60)
Glucose, Bld: 122 mg/dL — ABNORMAL HIGH (ref 65–99)
Potassium: 4 mmol/L (ref 3.5–5.1)
Sodium: 134 mmol/L — ABNORMAL LOW (ref 135–145)
Total Bilirubin: 0.7 mg/dL (ref 0.3–1.2)
Total Protein: 6.9 g/dL (ref 6.5–8.1)

## 2015-05-28 LAB — CBC WITH DIFFERENTIAL/PLATELET
Basophils Absolute: 0 10*3/uL (ref 0–0.1)
Basophils Relative: 1 %
Eosinophils Absolute: 0.1 10*3/uL (ref 0–0.7)
Eosinophils Relative: 2 %
HCT: 33.7 % — ABNORMAL LOW (ref 35.0–47.0)
Hemoglobin: 11.2 g/dL — ABNORMAL LOW (ref 12.0–16.0)
Lymphocytes Relative: 16 %
Lymphs Abs: 1 10*3/uL (ref 1.0–3.6)
MCH: 31.9 pg (ref 26.0–34.0)
MCHC: 33.1 g/dL (ref 32.0–36.0)
MCV: 96.3 fL (ref 80.0–100.0)
Monocytes Absolute: 0.6 10*3/uL (ref 0.2–0.9)
Monocytes Relative: 9 %
Neutro Abs: 4.6 10*3/uL (ref 1.4–6.5)
Neutrophils Relative %: 72 %
Platelets: 195 10*3/uL (ref 150–440)
RBC: 3.5 MIL/uL — ABNORMAL LOW (ref 3.80–5.20)
RDW: 17.7 % — ABNORMAL HIGH (ref 11.5–14.5)
WBC: 6.3 10*3/uL (ref 3.6–11.0)

## 2015-05-28 MED ORDER — SODIUM CHLORIDE 0.9 % IV SOLN
Freq: Once | INTRAVENOUS | Status: AC
  Administered 2015-05-28: 16:00:00 via INTRAVENOUS
  Filled 2015-05-28: qty 1000

## 2015-05-28 MED ORDER — HEPARIN SOD (PORK) LOCK FLUSH 100 UNIT/ML IV SOLN
500.0000 [IU] | Freq: Once | INTRAVENOUS | Status: AC | PRN
  Administered 2015-05-28: 500 [IU]

## 2015-05-28 MED ORDER — SODIUM CHLORIDE 0.9 % IJ SOLN
10.0000 mL | INTRAMUSCULAR | Status: DC | PRN
  Administered 2015-05-28: 10 mL
  Filled 2015-05-28: qty 10

## 2015-05-28 MED ORDER — CYANOCOBALAMIN 1000 MCG/ML IJ SOLN
1000.0000 ug | Freq: Once | INTRAMUSCULAR | Status: AC
  Administered 2015-05-28: 1000 ug via INTRAMUSCULAR
  Filled 2015-05-28: qty 1

## 2015-05-28 MED ORDER — HEPARIN SOD (PORK) LOCK FLUSH 100 UNIT/ML IV SOLN
INTRAVENOUS | Status: AC
  Filled 2015-05-28: qty 5

## 2015-05-28 MED ORDER — SODIUM CHLORIDE 0.9 % IV SOLN
10.0000 mg | Freq: Once | INTRAVENOUS | Status: AC
  Administered 2015-05-28: 10 mg via INTRAVENOUS
  Filled 2015-05-28: qty 1

## 2015-05-28 MED ORDER — PALONOSETRON HCL INJECTION 0.25 MG/5ML
0.2500 mg | Freq: Once | INTRAVENOUS | Status: AC
  Administered 2015-05-28: 0.25 mg via INTRAVENOUS
  Filled 2015-05-28: qty 5

## 2015-05-28 MED ORDER — SODIUM CHLORIDE 0.9 % IV SOLN
123.0000 mg | Freq: Once | INTRAVENOUS | Status: AC
  Administered 2015-05-28: 120 mg via INTRAVENOUS
  Filled 2015-05-28: qty 12

## 2015-05-28 NOTE — Progress Notes (Signed)
[facility] day:  05/28/2015   Chief Complaint: Dawn Wiley is an 81 y.o. female with stage IIIC right breast cancer who is seen for assessment prior to week #3 carboplatin and radiation.  HPI: The patient was last seen in the medical oncology clinic on 05/21/2015.  At that time, she received week #2 concurrent chemotherapy and radiation.   Chest wall lesions appear drier.  The medial lesion was smaller and less bulbous.  There was an area on her medial right upper arm and right lateral back which were slightly raised and worrisome for disease outside of the radiation field.  Potassium was normal.  During the interim, she has continued her daily radiation (Monday-Friday).  She notes no new complaints.  She denies any nausea, vomiting, or diarrhea.  She denies any pain.  Her energy level is good.  She has lost 2 pounds.   Past Medical History  Diagnosis Date  . Kidney stones   . Hyperlipidemia   . Coronary artery disease     Coronary calcifications noted on CT scan  . Diabetes mellitus without complication ([place])   . Nephrolithiasis   . Hypertension   . Arthritis   . Anemia   . Tachycardia     Dr. Fletcher Anon, cardiologist  . Cancer [facility])     breast (right)  . PONV (postoperative nausea and vomiting)   . Rash     back  . Breast cancer [facility])     Past Surgical History  Procedure Laterality Date  . Temporomandibular joint surgery    . Cataract extraction      bilateral  . Breast surgery      breast biopsy X 3  . Eye surgery      bilateral cataract extraction  . Mastectomy modified radical Right 08/31/2014    Procedure: MASTECTOMY MODIFIED RADICAL;  Surgeon: Molly Maduro, MD;  Location: [facility] ORS;  Service: General;  Laterality: Right;  . Portacath placement Left 10/20/2014    Procedure: INSERTION PORT-A-CATH;  Surgeon: Marlyce Huge, MD;  Location: [facility] ORS;  Service: General;  Laterality: Left;    Family History  Problem  Relation Age of Onset  . Peripheral Artery Disease Sister     carotid artery stenosis   . Heart disease Sister   . Diabetes Sister     Social History:  reports that she has never smoked. She has never used smokeless tobacco. She reports that she does not drink alcohol or use illicit drugs.  The patient is alone today.  Her daughter's phone number is [phone].  Allergies: No Known Allergies  Current Medications: Current Outpatient Prescriptions  Medication Sig Dispense Refill  . acetaminophen (TYLENOL) 500 MG tablet Take 500 mg by mouth every 6 (six) hours as needed for mild pain or moderate pain.     [name] Kitchen amLODipine (NORVASC) 5 MG tablet take 1 tablet by mouth once daily 90 tablet 1  . aspirin 81 MG tablet Take 81 mg by mouth every other day.    . Calcium Carbonate (CALTRATE 600 PO) Take 1,200 mg by mouth daily.     . cholecalciferol (VITAMIN D) 400 UNITS TABS tablet Take 800 Units by mouth.    . Cyanocobalamin (VITAMIN B-12 IJ) Inject as directed every 30 (thirty) days.     . diphenhydrAMINE (BENADRYL) 25 mg capsule Take 25 mg by mouth every 6 (six) hours as needed for allergies.    . ferrous sulfate 325 (65 FE) MG tablet Take  325 mg by mouth daily with breakfast.     . HYDROcodone-acetaminophen (NORCO/VICODIN) 5-325 MG tablet Take 1 tablet by mouth every 6 (six) hours as needed for moderate pain. 30 tablet 0  . lidocaine-prilocaine (EMLA) cream Apply 1 application topically as needed. Apply 1-2 hours prior to chemotherapy 30 g 1  . loratadine (CLARITIN) 10 MG tablet Take 1 tablet (10 mg total) by mouth daily. 30 tablet 0  . magic mouthwash SOLN Take 5 mLs by mouth 4 (four) times daily. 200 mL 1  . metFORMIN (GLUCOPHAGE) 500 MG tablet Take 1 tablet (500 mg total) by mouth 2 (two) times daily with a meal. 180 tablet 1  . metoprolol tartrate (LOPRESSOR) 25 MG tablet Take 1 tablet (25 mg total) by mouth 2 (two) times daily. 180 tablet 2  . ondansetron (ZOFRAN) 8 MG tablet take 1 tablet by  mouth twice a day if needed .[name] KitchenMarland KitchenSTART ON THE THIRD DAY AFTER CHEMOTHERAPY  0  . oxyCODONE-acetaminophen (PERCOCET/ROXICET) 5-325 MG per tablet Take 1 tablet by mouth every 4 (four) hours as needed for severe pain. 20 tablet 0  . potassium chloride (K-DUR,KLOR-CON) 10 MEQ tablet Take 1 tablet (10 mEq total) by mouth daily. 30 tablet 1  . temazepam (RESTORIL) 7.5 MG capsule Take 1 capsule (7.5 mg total) by mouth at bedtime as needed for sleep. 20 capsule 0   No current facility-administered medications for this visit.   Facility-Administered Medications Ordered in Other Visits  Medication Dose Route Frequency Provider Last Rate Last Dose  . sodium chloride 0.9 % injection 10 mL  10 mL Intravenous PRN [name], MD      . sodium chloride 0.9 % injection 10 mL  10 mL Intravenous PRN [name], MD   10 mL at 03/21/15 0849  . sodium chloride 0.9 % injection 10 mL  10 mL Intracatheter PRN [name], MD   10 mL at 04/25/15 0834  . sodium chloride 0.9 % injection 10 mL  10 mL Intracatheter PRN [name], MD   10 mL at 05/28/15 1315    Review of Systems:  GENERAL: No problems. No fevers or sweats. Weight down 2 pounds. PERFORMANCE STATUS (ECOG): 1 HEENT: No visual changes, runny nose, sore throat, or tenderness. Lungs: No shortness of breath or cough. No hemoptysis. Cardiac: No chest pain, palpitations, orthopnea, or PND. GI: No nausea, vomiting, diarrhea, constipation, melena or hematochezia. GU: No urgency, frequency, dysuria, or hematuria. Musculoskeletal: No back pain. No joint pain. No muscle tenderness. Extremities: No pain or swelling. Skin: Chest wall lesions.  Area on right arm, growing. Neuro: No headache, numbness or weakness, balance or coordination issues. Endocrine: Diabetes.  No thyroid issues, hot flashes or night sweats. Psych: No mood changes, depression or anxiety. Pain: No focal pain. Review of systems: All other systems  reviewed and found to be negative.  Physical Exam: Blood pressure 129/74, pulse 83, temperature 97.6 F (36.4 C), temperature source Tympanic, resp. rate 18, height 5' (1.524 m), weight 105 lb 6.1 oz (47.8 kg). GENERAL: Thin elderly woman sitting comfortably in the exam room in no acute distress. MENTAL STATUS: Alert and oriented to person, place and time. HEAD: Short silver wig. Normocephalic, atraumatic, face symmetric, no Cushingoid features. EYES: Blue eyes. Pupils equal round and reactive to light and accomodation.  No conjunctivitis or scleral icterus. ENT:  Oropharynx clear without lesion.  Tongue normal. Mucous membranes moist.  RESPIRATORY:  Clear to auscultation without rales, wheezes or rhonchi. CARDIOVASCULAR:  Regular rate and rhythm without murmur, rub or gallop. BREAST: Right mastectomy. Breast lesions dry.  Nodular lesion measuring 6.5 x 5 cm medially (dry and smaller).  Confluent nodular ruddy lesions extending laterally under axillae (15 cm long x 7 cm high and tapering laterally to 2 cm).  Medial to lateral skin changes approximately 23 cm. ABDOMEN:  Soft, non-tender, with active bowel sounds, and no hepatosplenomegaly.  No masses. SKIN: Chest wall lesions as above.  Upper right arm with 3.5 cm pink slightly palpable irregularity. Right far lateral back (near axillae) with 4 x 2 cm very faint erythema. EXTREMITIES: No edema, no skin discoloration or tenderness. No palpable cords. LYMPH NODES: No palpable axillary adenopathy  NEUROLOGICAL: Unremarkable. PSYCH: Appropriate.   Infusion on 05/28/2015  Component Date Value Ref Range Status  . WBC 05/28/2015 6.3  3.6 - 11.0 K/uL Final  . RBC 05/28/2015 3.50* 3.80 - 5.20 MIL/uL Final  . Hemoglobin 05/28/2015 11.2* 12.0 - 16.0 g/dL Final  . HCT 05/28/2015 33.7* 35.0 - 47.0 % Final  . MCV 05/28/2015 96.3  80.0 - 100.0 fL Final  . MCH 05/28/2015 31.9  26.0 - 34.0 pg Final  . MCHC 05/28/2015 33.1  32.0 - 36.0 g/dL Final   . RDW 05/28/2015 17.7* 11.5 - 14.5 % Final  . Platelets 05/28/2015 195  150 - 440 K/uL Final  . Neutrophils Relative % 05/28/2015 72   Final  . Neutro Abs 05/28/2015 4.6  1.4 - 6.5 K/uL Final  . Lymphocytes Relative 05/28/2015 16   Final  . Lymphs Abs 05/28/2015 1.0  1.0 - 3.6 K/uL Final  . Monocytes Relative 05/28/2015 9   Final  . Monocytes Absolute 05/28/2015 0.6  0.2 - 0.9 K/uL Final  . Eosinophils Relative 05/28/2015 2   Final  . Eosinophils Absolute 05/28/2015 0.1  0 - 0.7 K/uL Final  . Basophils Relative 05/28/2015 1   Final  . Basophils Absolute 05/28/2015 0.0  0 - 0.1 K/uL Final  . Sodium 05/28/2015 134* 135 - 145 mmol/L Final  . Potassium 05/28/2015 4.0  3.5 - 5.1 mmol/L Final  . Chloride 05/28/2015 101  101 - 111 mmol/L Final  . CO2 05/28/2015 24  22 - 32 mmol/L Final  . Glucose, Bld 05/28/2015 122* 65 - 99 mg/dL Final  . BUN 05/28/2015 20  6 - 20 mg/dL Final  . Creatinine, Ser 05/28/2015 0.53  0.44 - 1.00 mg/dL Final  . Calcium 05/28/2015 9.4  8.9 - 10.3 mg/dL Final  . Total Protein 05/28/2015 6.9  6.5 - 8.1 g/dL Final  . Albumin 05/28/2015 4.1  3.5 - 5.0 g/dL Final  . AST 05/28/2015 20  15 - 41 U/L Final  . ALT 05/28/2015 13* 14 - 54 U/L Final  . Alkaline Phosphatase 05/28/2015 61  38 - 126 U/L Final  . Total Bilirubin 05/28/2015 0.7  0.3 - 1.2 mg/dL Final  . GFR calc non Af Amer 05/28/2015 >60  >60 mL/min Final  . GFR calc Af Amer 05/28/2015 >60  >60 mL/min Final   Comment: (NOTE) The eGFR has been calculated using the CKD EPI equation. This calculation has not been validated in all clinical situations. eGFR's persistently <60 mL/min signify possible Chronic Kidney Disease.   . Anion gap 05/28/2015 9  5 - 15 Final    Assessment:  Dawn Wiley is an 81 y.o. female with stage IIIC right breast cancer status post mastectomy and axillary lymph node dissection on 08/31/2014. 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 Carcinoma involved skeletal muscle and dermal lymphatics. Tumor was less than 0.5 mm from the deep margin. Thirteen of 15 lymph nodes were involved. The largest metastatic focus was 2 cm. Tumor is triple negative (ER negative, PR negative, and HER-2/neu negative).  Tumor is androgen receptor negative.  Pathologic stage was [id number].  PET scan on 09/21/2014 revealed postoperative changes in the right chest with no suspicious findings or residual tumor. Bone scan on 10/11/2014 revealed no evidence of metastatic disease.  Bone density study on 10/02/2014 revealed a T score of -4.7 in the forearm consistent with osteoporosis. T-score was less than 2.5 in the spine or hip. Echocardiogram on 10/02/2014 revealed ejection fraction of 60-65%.  She has iron deficiency anemia. Labs on 09/19/2014 revealed a hematocrit of 29.9, hemoglobin 8.7, MCV 69.7, ferritin 9, and TIBC 616. B12 was low (265) with a prior history of B12 deficiency and need for supplementation (stopped 06/2014). She began B12 on 10/12/2014 (last 04/25/2015).  Folate was normal. Her diet is modest. She has never had a colonoscopy. She denies any melena or hematochezia.  She takes 1 iron pill a day (additional iron causes diarrhea).  Ferritin was 116 on 04/25/2015.  Chest wall biopsy x 3 (lateral, medial, and middle sites) on 11/08/2014 revealed dermal lymphatic involvement by invasive mammary carcinoma with micropapillary features.    She has aggressive disease with growth despite several chemotherapeutic agents.  She received 5 weeks of Taxol (11/03/2014 - 12/01/2014).  She received 1 week of adriamycin (12/14/2014).  She received 1 cycle of carboplatin (12/28/2014).  After carboplatin, disease appeared to stabilize, but with 2 weeks off of therapy, her disease grew again (medial lesion larger and weaping).   She received 3 cycles of carboplatin and Taxol (01/18/2015 - 03/02/2015).  Chest wall lesions initially  decreased then began to grow.  She received 2 cycles of adriamycin and Cytoxan (03/26/2015 - 04/11/2015) with Neulasta support.  ANC nadir was 200 on day 9 (04/03/2015).  Cycle #2 was complicated by mouth sores, decreased oral intake, and weight loss. Chest wall disease began to grow before cycle #3 could be administered.  PET scan on 04/26/2015 revealed extensive recurrence about the right chest.  The far lateral component measured 4.2 x 2.2 cm (S.U.V.13.5).  A portion superficial to the right-side of the sternum measured 5.2 x 2.2 cm (S.U.V. 9.1).  There was diffuse more inferior and lateral chest wall soft tissue thickening and hypermetabolism.  CA27.29 was 118.4 on 05/02/2015.  She is week #3 of concurrent weekly carboplatin and radiation (began 05/14/2015).  Symptomatically, she is doing well.  She has lost 2 pounds.  Chest wall lesions are slightly smaller and drier. The area on her medial right upper arm is more prominent today and is worrisome for disease outside of the radiation field.  Potassium is normal.  Plan: 1.  Labs today:  CBC with diff, CMP. 2.  Week #3 carboplatin today. 3.  Medical photographs. 4.  Follow-up PDL-L1 testing.   5.  Continue potassium chloride 10 meq po q day. 6.  B12 today and monthly. 7.  Discuss caloric intake and maintaining weight. 8.  RTC in 1 week for MD assess, labs (CBC with diff, CMP), and week #4 concurrent carboplatin and radiation.   [name], MD  05/28/2015, 5:03 PM

## 2015-05-29 ENCOUNTER — Ambulatory Visit
Admission: RE | Admit: 2015-05-29 | Discharge: 2015-05-29 | Disposition: A | Discharge status: Home / Self Care | Payer: PPO | Source: Ambulatory Visit | Attending: Radiation Oncology | Admitting: Radiation Oncology

## 2015-05-29 DIAGNOSIS — Z510 Encounter for antineoplastic radiation therapy: Secondary | ICD-10-CM | POA: Diagnosis not present

## 2015-05-30 ENCOUNTER — Ambulatory Visit
Admission: RE | Admit: 2015-05-30 | Discharge: 2015-05-30 | Disposition: A | Discharge status: Home / Self Care | Payer: PPO | Source: Ambulatory Visit | Attending: Radiation Oncology | Admitting: Radiation Oncology

## 2015-05-30 DIAGNOSIS — Z510 Encounter for antineoplastic radiation therapy: Secondary | ICD-10-CM | POA: Diagnosis not present

## 2015-05-31 ENCOUNTER — Ambulatory Visit
Admission: RE | Admit: 2015-05-31 | Discharge: 2015-05-31 | Disposition: A | Discharge status: Home / Self Care | Payer: PPO | Source: Ambulatory Visit | Attending: Radiation Oncology | Admitting: Radiation Oncology

## 2015-05-31 DIAGNOSIS — Z510 Encounter for antineoplastic radiation therapy: Secondary | ICD-10-CM | POA: Diagnosis not present

## 2015-06-01 ENCOUNTER — Ambulatory Visit
Admission: RE | Admit: 2015-06-01 | Discharge: 2015-06-01 | Disposition: A | Discharge status: Home / Self Care | Payer: PPO | Source: Ambulatory Visit | Attending: Radiation Oncology | Admitting: Radiation Oncology

## 2015-06-01 DIAGNOSIS — Z510 Encounter for antineoplastic radiation therapy: Secondary | ICD-10-CM | POA: Diagnosis not present

## 2015-06-03 ENCOUNTER — Other Ambulatory Visit: Payer: Self-pay | Admitting: Hematology and Oncology

## 2015-06-04 ENCOUNTER — Inpatient Hospital Stay: Payer: PPO

## 2015-06-04 ENCOUNTER — Ambulatory Visit: Payer: PPO | Admitting: Internal Medicine

## 2015-06-04 ENCOUNTER — Inpatient Hospital Stay: Payer: PPO | Attending: Internal Medicine | Admitting: Internal Medicine

## 2015-06-04 ENCOUNTER — Encounter: Payer: Self-pay | Admitting: Hematology and Oncology

## 2015-06-04 ENCOUNTER — Ambulatory Visit
Admission: RE | Admit: 2015-06-04 | Discharge: 2015-06-04 | Disposition: A | Discharge status: Home / Self Care | Payer: PPO | Source: Ambulatory Visit | Attending: Radiation Oncology | Admitting: Radiation Oncology

## 2015-06-04 VITALS — BP 139/83 | HR 100 | Temp 96.3°F | Resp 18 | Ht 60.0 in | Wt 105.5 lb

## 2015-06-04 DIAGNOSIS — D509 Iron deficiency anemia, unspecified: Secondary | ICD-10-CM | POA: Diagnosis not present

## 2015-06-04 DIAGNOSIS — C50911 Malignant neoplasm of unspecified site of right female breast: Secondary | ICD-10-CM

## 2015-06-04 DIAGNOSIS — E538 Deficiency of other specified B group vitamins: Secondary | ICD-10-CM | POA: Insufficient documentation

## 2015-06-04 DIAGNOSIS — M129 Arthropathy, unspecified: Secondary | ICD-10-CM | POA: Diagnosis not present

## 2015-06-04 DIAGNOSIS — I2510 Atherosclerotic heart disease of native coronary artery without angina pectoris: Secondary | ICD-10-CM

## 2015-06-04 DIAGNOSIS — I10 Essential (primary) hypertension: Secondary | ICD-10-CM | POA: Insufficient documentation

## 2015-06-04 DIAGNOSIS — R21 Rash and other nonspecific skin eruption: Secondary | ICD-10-CM

## 2015-06-04 DIAGNOSIS — Z79899 Other long term (current) drug therapy: Secondary | ICD-10-CM | POA: Diagnosis not present

## 2015-06-04 DIAGNOSIS — Z171 Estrogen receptor negative status [ER-]: Secondary | ICD-10-CM | POA: Insufficient documentation

## 2015-06-04 DIAGNOSIS — Z87442 Personal history of urinary calculi: Secondary | ICD-10-CM | POA: Diagnosis not present

## 2015-06-04 DIAGNOSIS — Z7984 Long term (current) use of oral hypoglycemic drugs: Secondary | ICD-10-CM

## 2015-06-04 DIAGNOSIS — E119 Type 2 diabetes mellitus without complications: Secondary | ICD-10-CM | POA: Diagnosis not present

## 2015-06-04 DIAGNOSIS — E785 Hyperlipidemia, unspecified: Secondary | ICD-10-CM | POA: Diagnosis not present

## 2015-06-04 DIAGNOSIS — D649 Anemia, unspecified: Secondary | ICD-10-CM | POA: Diagnosis not present

## 2015-06-04 DIAGNOSIS — C779 Secondary and unspecified malignant neoplasm of lymph node, unspecified: Secondary | ICD-10-CM | POA: Insufficient documentation

## 2015-06-04 DIAGNOSIS — Z5111 Encounter for antineoplastic chemotherapy: Secondary | ICD-10-CM | POA: Diagnosis present

## 2015-06-04 DIAGNOSIS — Z7982 Long term (current) use of aspirin: Secondary | ICD-10-CM | POA: Insufficient documentation

## 2015-06-04 DIAGNOSIS — Z510 Encounter for antineoplastic radiation therapy: Secondary | ICD-10-CM | POA: Diagnosis not present

## 2015-06-04 LAB — COMPREHENSIVE METABOLIC PANEL
ALT: 13 U/L — ABNORMAL LOW (ref 14–54)
AST: 19 U/L (ref 15–41)
Albumin: 4.2 g/dL (ref 3.5–5.0)
Alkaline Phosphatase: 62 U/L (ref 38–126)
Anion gap: 8 (ref 5–15)
BUN: 17 mg/dL (ref 6–20)
CO2: 24 mmol/L (ref 22–32)
Calcium: 9.3 mg/dL (ref 8.9–10.3)
Chloride: 101 mmol/L (ref 101–111)
Creatinine, Ser: 0.46 mg/dL (ref 0.44–1.00)
GFR calc Af Amer: >60 mL/min (ref >60)
GFR calc non Af Amer: >60 mL/min (ref >60)
Glucose, Bld: 139 mg/dL — ABNORMAL HIGH (ref 65–99)
Potassium: 3.7 mmol/L (ref 3.5–5.1)
Sodium: 133 mmol/L — ABNORMAL LOW (ref 135–145)
Total Bilirubin: 0.6 mg/dL (ref 0.3–1.2)
Total Protein: 6.6 g/dL (ref 6.5–8.1)

## 2015-06-04 LAB — CBC WITH DIFFERENTIAL/PLATELET
Basophils Absolute: 0 10*3/uL (ref 0–0.1)
Basophils Relative: 1 %
Eosinophils Absolute: 0.1 10*3/uL (ref 0–0.7)
Eosinophils Relative: 2 %
HCT: 33 % — ABNORMAL LOW (ref 35.0–47.0)
Hemoglobin: 11 g/dL — ABNORMAL LOW (ref 12.0–16.0)
Lymphocytes Relative: 29 %
Lymphs Abs: 1.4 10*3/uL (ref 1.0–3.6)
MCH: 31.8 pg (ref 26.0–34.0)
MCHC: 33.4 g/dL (ref 32.0–36.0)
MCV: 95.2 fL (ref 80.0–100.0)
Monocytes Absolute: 0.4 10*3/uL (ref 0.2–0.9)
Monocytes Relative: 9 %
Neutro Abs: 3 10*3/uL (ref 1.4–6.5)
Neutrophils Relative %: 59 %
Platelets: 166 10*3/uL (ref 150–440)
RBC: 3.47 MIL/uL — ABNORMAL LOW (ref 3.80–5.20)
RDW: 17.3 % — ABNORMAL HIGH (ref 11.5–14.5)
WBC: 5 10*3/uL (ref 3.6–11.0)

## 2015-06-04 MED ORDER — SODIUM CHLORIDE 0.9 % IV SOLN
Freq: Once | INTRAVENOUS | Status: AC
  Administered 2015-06-04: 15:00:00 via INTRAVENOUS
  Filled 2015-06-04: qty 1000

## 2015-06-04 MED ORDER — PALONOSETRON HCL INJECTION 0.25 MG/5ML
0.2500 mg | Freq: Once | INTRAVENOUS | Status: AC
  Administered 2015-06-04: 0.25 mg via INTRAVENOUS
  Filled 2015-06-04: qty 5

## 2015-06-04 MED ORDER — CARBOPLATIN CHEMO INJECTION 450 MG/45ML
123.0000 mg | Freq: Once | INTRAVENOUS | Status: AC
  Administered 2015-06-04: 120 mg via INTRAVENOUS
  Filled 2015-06-04: qty 12

## 2015-06-04 MED ORDER — SODIUM CHLORIDE 0.9 % IV SOLN
10.0000 mg | Freq: Once | INTRAVENOUS | Status: AC
  Administered 2015-06-04: 10 mg via INTRAVENOUS
  Filled 2015-06-04: qty 1

## 2015-06-04 MED ORDER — SODIUM CHLORIDE 0.9 % IJ SOLN
10.0000 mL | INTRAMUSCULAR | Status: DC | PRN
  Administered 2015-06-04: 10 mL
  Filled 2015-06-04: qty 10

## 2015-06-04 MED ORDER — HEPARIN SOD (PORK) LOCK FLUSH 100 UNIT/ML IV SOLN
500.0000 [IU] | Freq: Once | INTRAVENOUS | Status: AC | PRN
  Administered 2015-06-04: 500 [IU]
  Filled 2015-06-04: qty 5

## 2015-06-04 NOTE — Progress Notes (Signed)
[place] Wiley day:  06/04/2015   Chief Complaint: Dawn Wiley is an 81 y.o. female with stage IIIC right breast cancer who is seen for assessment prior to week # 4 carboplatin and radiation.  HPI:  The patient returns to our [facility] for a follow-up visit. She continues to tolerate carboplatin and radiation remarkably well without any significant complaints. Her appetite and energy level are satisfactory. She denies any significant pain, nausea, shortness of breath, diarrhea. Her weight appears to be stable as compared to the last week. She did not see any new suspicious lesions on his skin.  Past Medical History  Diagnosis Date  . Kidney stones   . Hyperlipidemia   . Coronary artery disease     Coronary calcifications noted on CT scan  . Diabetes mellitus without complication (Dawn [facility])   . Nephrolithiasis   . Hypertension   . Arthritis   . Anemia   . Tachycardia     Dr. Fletcher Wiley, cardiologist  . Cancer [facility])     breast (right)  . PONV (postoperative nausea and vomiting)   . Rash     back  . Breast cancer [facility])     Past Surgical History  Procedure Laterality Date  . Temporomandibular joint surgery    . Cataract extraction      bilateral  . Breast surgery      breast biopsy X 3  . Eye surgery      bilateral cataract extraction  . Mastectomy modified radical Right 08/31/2014    Procedure: MASTECTOMY MODIFIED RADICAL;  Surgeon: Dawn Maduro, MD;  Location: [facility];  Service: General;  Laterality: Right;  . Portacath placement Left 10/20/2014    Procedure: INSERTION PORT-A-CATH;  Surgeon: Dawn Huge, MD;  Location: [facility];  Service: General;  Laterality: Left;    Family History  Problem Relation Age of Onset  . Peripheral Artery Disease Sister     carotid artery stenosis   . Heart disease Sister   . Diabetes Sister     Social History:  reports that she has never smoked. She has never used smokeless tobacco. She  reports that she does not drink alcohol or use illicit drugs.  The patient is alone today.  Her daughter's phone number is [phone].  Allergies: No Known Allergies  Current Medications: Current Outpatient Prescriptions  Medication Sig Dispense Refill  . acetaminophen (TYLENOL) 500 MG tablet Take 500 mg by mouth every 6 (six) hours as needed for mild pain or moderate pain.     Dawn [facility] Kitchen amLODipine (NORVASC) 5 MG tablet take 1 tablet by mouth once daily 90 tablet 1  . aspirin 81 MG tablet Take 81 mg by mouth every other day.    . Calcium Carbonate (CALTRATE 600 PO) Take 1,200 mg by mouth daily.     . cholecalciferol (VITAMIN D) 400 UNITS TABS tablet Take 800 Units by mouth.    . Cyanocobalamin (VITAMIN B-12 IJ) Inject as directed every 30 (thirty) days.     . diphenhydrAMINE (BENADRYL) 25 mg capsule Take 25 mg by mouth every 6 (six) hours as needed for allergies.    . ferrous sulfate 325 (65 FE) MG tablet Take 325 mg by mouth daily with breakfast.     . HYDROcodone-acetaminophen (NORCO/VICODIN) 5-325 MG tablet Take 1 tablet by mouth every 6 (six) hours as needed for moderate pain. 30 tablet 0  . lidocaine-prilocaine (EMLA) cream Apply 1 application topically as needed. Apply 1-2 hours prior to  chemotherapy 30 g 1  . loratadine (CLARITIN) 10 MG tablet Take 1 tablet (10 mg total) by mouth daily. 30 tablet 0  . magic mouthwash SOLN Take 5 mLs by mouth 4 (four) times daily. 200 mL 1  . metFORMIN (GLUCOPHAGE) 500 MG tablet Take 1 tablet (500 mg total) by mouth 2 (two) times daily with a meal. 180 tablet 1  . metoprolol tartrate (LOPRESSOR) 25 MG tablet Take 1 tablet (25 mg total) by mouth 2 (two) times daily. 180 tablet 2  . ondansetron (ZOFRAN) 8 MG tablet take 1 tablet by mouth twice a day if needed .Dawn Wiley KitchenMarland KitchenSTART ON THE THIRD DAY AFTER CHEMOTHERAPY  0  . oxyCODONE-acetaminophen (PERCOCET/ROXICET) 5-325 MG per tablet Take 1 tablet by mouth every 4 (four) hours as needed for severe pain. 20 tablet 0  .  potassium chloride (K-DUR,KLOR-CON) 10 MEQ tablet Take 1 tablet (10 mEq total) by mouth daily. 30 tablet 1  . temazepam (RESTORIL) 7.5 MG capsule Take 1 capsule (7.5 mg total) by mouth at bedtime as needed for sleep. 20 capsule 0   No current facility-administered medications for this visit.   Facility-Administered Medications Ordered in Other Visits  Medication Dose Route Frequency Provider Last Rate Last Dose  . heparin lock flush 100 unit/mL  500 Units Intracatheter Once PRN Dawn Asal, MD      . sodium chloride 0.9 % injection 10 mL  10 mL Intravenous PRN Dawn Asal, MD      . sodium chloride 0.9 % injection 10 mL  10 mL Intravenous PRN Dawn Asal, MD   10 mL at 03/21/15 0849  . sodium chloride 0.9 % injection 10 mL  10 mL Intracatheter PRN Dawn Asal, MD   10 mL at 04/25/15 0834  . sodium chloride 0.9 % injection 10 mL  10 mL Intracatheter PRN Dawn Asal, MD   10 mL at 06/04/15 1343    Review of Systems:  GENERAL: No problems. No fevers or sweats. Weight down 2 pounds. PERFORMANCE STATUS (ECOG): 1 HEENT: No visual changes, runny nose, sore throat, or tenderness. Lungs: No shortness of breath or cough. No hemoptysis. Cardiac: No chest pain, palpitations, orthopnea, or PND. GI: No nausea, vomiting, diarrhea, constipation, melena or hematochezia. GU: No urgency, frequency, dysuria, or hematuria. Musculoskeletal: No back pain. No joint pain. No muscle tenderness. Extremities: No pain or swelling. Skin: Chest wall lesions.  Area on right arm, growing. Neuro: No headache, numbness or weakness, balance or coordination issues. Endocrine: Diabetes.  No thyroid issues, hot flashes or night sweats. Psych: No mood changes, depression or anxiety. Pain: No focal pain. Review of systems: All other systems reviewed and found to be negative.  Physical Exam: Blood pressure 139/83, pulse 100, temperature 96.3 F (35.7 C), temperature  source Tympanic, resp. rate 18, height 5' (1.524 m), weight 105 lb 7.8 oz (47.85 kg). GENERAL: Thin elderly woman sitting comfortably in the exam room in no acute distress. MENTAL STATUS: Alert and oriented to person, place and time. HEAD: Short silver wig. Normocephalic, atraumatic, face symmetric, no Cushingoid features. EYES: Blue eyes. Pupils equal round and reactive to light and accomodation.  No conjunctivitis or scleral icterus. ENT:  Oropharynx clear without lesion.  Tongue normal. Mucous membranes moist.  RESPIRATORY:  Clear to auscultation without rales, wheezes or rhonchi. CARDIOVASCULAR:  Regular rate and rhythm without murmur, rub or gallop. BREAST: Right mastectomy. Breast lesions dry.  Nodular lesion measuring 6.5 x 5 cm medially.  Confluent nodular ruddy  lesions extending laterally under axillae (15 cm long x 7 cm high and tapering laterally to 2 cm).  Medial to lateral skin changes approximately 23 cm. ABDOMEN:  Soft, non-tender, with active bowel sounds, and no hepatosplenomegaly.  No masses. SKIN: Chest wall lesions as above.  Upper right arm with 3.5 cm pink slightly palpable irregularity. Right far lateral back (near axillae) with 4 x 2 cm very faint erythema. EXTREMITIES: No edema, no skin discoloration or tenderness. No palpable cords. LYMPH NODES: No palpable axillary adenopathy  NEUROLOGICAL: Unremarkable. PSYCH: Appropriate.   Infusion on 06/04/2015  Component Date Value Ref Range Status  . WBC 06/04/2015 5.0  3.6 - 11.0 K/uL Final  . RBC 06/04/2015 3.47* 3.80 - 5.20 MIL/uL Final  . Hemoglobin 06/04/2015 11.0* 12.0 - 16.0 g/dL Final  . HCT 06/04/2015 33.0* 35.0 - 47.0 % Final  . MCV 06/04/2015 95.2  80.0 - 100.0 fL Final  . MCH 06/04/2015 31.8  26.0 - 34.0 pg Final  . MCHC 06/04/2015 33.4  32.0 - 36.0 g/dL Final  . RDW 06/04/2015 17.3* 11.5 - 14.5 % Final  . Platelets 06/04/2015 166  150 - 440 K/uL Final  . Neutrophils Relative % 06/04/2015 59   Final   . Neutro Abs 06/04/2015 3.0  1.4 - 6.5 K/uL Final  . Lymphocytes Relative 06/04/2015 29   Final  . Lymphs Abs 06/04/2015 1.4  1.0 - 3.6 K/uL Final  . Monocytes Relative 06/04/2015 9   Final  . Monocytes Absolute 06/04/2015 0.4  0.2 - 0.9 K/uL Final  . Eosinophils Relative 06/04/2015 2   Final  . Eosinophils Absolute 06/04/2015 0.1  0 - 0.7 K/uL Final  . Basophils Relative 06/04/2015 1   Final  . Basophils Absolute 06/04/2015 0.0  0 - 0.1 K/uL Final  . Sodium 06/04/2015 133* 135 - 145 mmol/L Final  . Potassium 06/04/2015 3.7  3.5 - 5.1 mmol/L Final  . Chloride 06/04/2015 101  101 - 111 mmol/L Final  . CO2 06/04/2015 24  22 - 32 mmol/L Final  . Glucose, Bld 06/04/2015 139* 65 - 99 mg/dL Final  . BUN 06/04/2015 17  6 - 20 mg/dL Final  . Creatinine, Ser 06/04/2015 0.46  0.44 - 1.00 mg/dL Final  . Calcium 06/04/2015 9.3  8.9 - 10.3 mg/dL Final  . Total Protein 06/04/2015 6.6  6.5 - 8.1 g/dL Final  . Albumin 06/04/2015 4.2  3.5 - 5.0 g/dL Final  . AST 06/04/2015 19  15 - 41 U/L Final  . ALT 06/04/2015 13* 14 - 54 U/L Final  . Alkaline Phosphatase 06/04/2015 62  38 - 126 U/L Final  . Total Bilirubin 06/04/2015 0.6  0.3 - 1.2 mg/dL Final  . GFR calc non Af Amer 06/04/2015 >60  >60 mL/min Final  . GFR calc Af Amer 06/04/2015 >60  >60 mL/min Final   Comment: (NOTE) The eGFR has been calculated using the CKD EPI equation. This calculation has not been validated in all clinical situations. eGFR's persistently <60 mL/min signify possible Chronic Kidney Disease.   . Anion gap 06/04/2015 8  5 - 15 Final    Assessment:  Dawn Wiley is an 81 y.o. female with stage IIIC right breast cancer status post mastectomy and axillary lymph node dissection on 08/31/2014. 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 Carcinoma involved skeletal muscle and dermal lymphatics. Tumor was less than 0.5 mm from the deep margin. Thirteen of 15 lymph  nodes were involved.  The largest metastatic focus was 2 cm. Tumor is triple negative (ER negative, PR negative, and HER-2/neu negative).  Tumor is androgen receptor negative.  Pathologic stage was [id number].  PET scan on 09/21/2014 revealed postoperative changes in the right chest with no suspicious findings or residual tumor. Bone scan on 10/11/2014 revealed no evidence of metastatic disease.  Bone density study on 10/02/2014 revealed a T score of -4.7 in the forearm consistent with osteoporosis. T-score was less than 2.5 in the spine or hip. Echocardiogram on 10/02/2014 revealed ejection fraction of 60-65%.  She has iron deficiency anemia. Labs on 09/19/2014 revealed a hematocrit of 29.9, hemoglobin 8.7, MCV 69.7, ferritin 9, and TIBC 616. B12 was low (265) with a prior history of B12 deficiency and need for supplementation (stopped 06/2014). She began B12 on 10/12/2014 (last 04/25/2015).  Folate was normal. Her diet is modest. She has never had a colonoscopy. She denies any melena or hematochezia.  She takes 1 iron pill a day (additional iron causes diarrhea).  Ferritin was 116 on 04/25/2015.  Chest wall biopsy x 3 (lateral, medial, and middle sites) on 11/08/2014 revealed dermal lymphatic involvement by invasive mammary carcinoma with micropapillary features.    She has aggressive disease with growth despite several chemotherapeutic agents.  She received 5 weeks of Taxol (11/03/2014 - 12/01/2014).  She received 1 week of adriamycin (12/14/2014).  She received 1 cycle of carboplatin (12/28/2014).  After carboplatin, disease appeared to stabilize, but with 2 weeks off of therapy, her disease grew again (medial lesion larger and weaping).   She received 3 cycles of carboplatin and Taxol (01/18/2015 - 03/02/2015).  Chest wall lesions initially decreased then began to grow.  She received 2 cycles of adriamycin and Cytoxan (03/26/2015 - 04/11/2015) with Neulasta support.  ANC nadir was 200 on day 9  (04/03/2015).  Cycle #2 was complicated by mouth sores, decreased oral intake, and weight loss. Chest wall disease began to grow before cycle #3 could be administered.  PET scan on 04/26/2015 revealed extensive recurrence about the right chest.  The far lateral component measured 4.2 x 2.2 cm (S.U.V.13.5).  A portion superficial to the right-side of the sternum measured 5.2 x 2.2 cm (S.U.V. 9.1).  There was diffuse more inferior and lateral chest wall soft tissue thickening and hypermetabolism.  CA27.29 was 118.4 on 05/02/2015.  She is week # 4 of concurrent weekly carboplatin and radiation (began 05/14/2015). With plan of 5 weeks of radiation.  Symptomatically, she is doing well.  Her weight is stable as compared to last week.  Chest wall lesions continued to slowly recede. The area on her medial right upper arm which appeared more prominent during her last visit and was worrisome for progression of disease outside of the radiation field, appears to be less prominent.  Her energy level, mental attitude appeared to be remarkably stable and positive.  Plan: 1.  Labs today:  CBC with diff, CMP. 2.  Week # 4 carboplatin today. 3.  Medical photographs. 4.  Follow-up PDL-L1 testing-have not been reported yet.   5.  Continue potassium chloride 10 meq po q day. 6.  B12 today and monthly. 7.  Discuss caloric intake and maintaining weight. 8.  RTC in 1 week for MD assess, labs (CBC with diff, CMP), and week # 5 concurrent carboplatin and radiation.   Roxana Hires, MD  06/04/2015, 2:16 PM

## 2015-06-04 NOTE — Progress Notes (Signed)
No changes since last visit. 

## 2015-06-05 ENCOUNTER — Ambulatory Visit (INDEPENDENT_AMBULATORY_CARE_PROVIDER_SITE_OTHER): Payer: PPO | Admitting: Internal Medicine

## 2015-06-05 ENCOUNTER — Encounter: Payer: Self-pay | Admitting: Internal Medicine

## 2015-06-05 ENCOUNTER — Ambulatory Visit
Admission: RE | Admit: 2015-06-05 | Discharge: 2015-06-05 | Disposition: A | Discharge status: Home / Self Care | Payer: PPO | Source: Ambulatory Visit | Attending: Radiation Oncology | Admitting: Radiation Oncology

## 2015-06-05 ENCOUNTER — Encounter: Payer: Self-pay | Admitting: *Deleted

## 2015-06-05 VITALS — BP 134/70 | HR 85 | Temp 98.1°F | Resp 12 | Ht 60.0 in | Wt 105.2 lb

## 2015-06-05 DIAGNOSIS — C50911 Malignant neoplasm of unspecified site of right female breast: Secondary | ICD-10-CM

## 2015-06-05 DIAGNOSIS — Z510 Encounter for antineoplastic radiation therapy: Secondary | ICD-10-CM | POA: Diagnosis not present

## 2015-06-05 DIAGNOSIS — I10 Essential (primary) hypertension: Secondary | ICD-10-CM | POA: Diagnosis not present

## 2015-06-05 DIAGNOSIS — E119 Type 2 diabetes mellitus without complications: Secondary | ICD-10-CM | POA: Diagnosis not present

## 2015-06-05 MED ORDER — OXYCODONE-ACETAMINOPHEN 5-325 MG PO TABS: 1.0000 | ORAL_TABLET | ORAL | Status: DC | PRN

## 2015-06-05 MED ORDER — METFORMIN HCL 500 MG PO TABS: 500.0000 mg | ORAL_TABLET | Freq: Two times a day (BID) | ORAL | Status: DC

## 2015-06-05 NOTE — Progress Notes (Signed)
Subjective:  Patient ID: Dawn Wiley, female    DOB: 08-Mar-1935  Age: 81 y.o. MRN: [id number]  CC: The primary encounter diagnosis was Breast cancer, stage 3, right ([place]). Diagnoses of Diabetes mellitus without complication ([place]) and Essential hypertension were also pertinent to this visit.  HPI Dawn Wiley presents for follow up on hypertension, diabetes and breast cancer.  She received bad news from Dr Baruch Gouty, her radiation oncologist  today about her breast cancer .  She understands now taht her cancer is not curable,  But only manageable.   Having elevated  BS due to use of zofran on Mondays to prevent nausea from chemo.  She recevives a weekly  for infusion of carboplatinum. Her evening sugar  Has been as high as 300,  And she took extradfoses of metformin 500 mg to lower her sugar to 200 this morning.  This has occurred every Monday and by Tuesday her BS are back to normal.  Denies pain. Has lost her hair,  Wearing a wig now.  Keeping her diagnosis very close to the vest to prevent negative emotions from others affecting her outlook.     Outpatient Prescriptions Prior to Visit  Medication Sig Dispense Refill  . acetaminophen (TYLENOL) 500 MG tablet Take 500 mg by mouth every 6 (six) hours as needed for mild pain or moderate pain.     [name] Kitchen amLODipine (NORVASC) 5 MG tablet take 1 tablet by mouth once daily 90 tablet 1  . aspirin 81 MG tablet Take 81 mg by mouth every other day.    . Calcium Carbonate (CALTRATE 600 PO) Take 1,200 mg by mouth daily.     . cholecalciferol (VITAMIN D) 400 UNITS TABS tablet Take 800 Units by mouth.    . Cyanocobalamin (VITAMIN B-12 IJ) Inject as directed every 30 (thirty) days.     . diphenhydrAMINE (BENADRYL) 25 mg capsule Take 25 mg by mouth every 6 (six) hours as needed for allergies.    . ferrous sulfate 325 (65 FE) MG tablet Take 325 mg by mouth daily with breakfast.     . HYDROcodone-acetaminophen (NORCO/VICODIN) 5-325 MG tablet Take 1 tablet  by mouth every 6 (six) hours as needed for moderate pain. 30 tablet 0  . lidocaine-prilocaine (EMLA) cream Apply 1 application topically as needed. Apply 1-2 hours prior to chemotherapy 30 g 1  . loratadine (CLARITIN) 10 MG tablet Take 1 tablet (10 mg total) by mouth daily. 30 tablet 0  . magic mouthwash SOLN Take 5 mLs by mouth 4 (four) times daily. 200 mL 1  . metoprolol tartrate (LOPRESSOR) 25 MG tablet Take 1 tablet (25 mg total) by mouth 2 (two) times daily. 180 tablet 2  . ondansetron (ZOFRAN) 8 MG tablet take 1 tablet by mouth twice a day if needed .[name] KitchenMarland KitchenSTART ON THE THIRD DAY AFTER CHEMOTHERAPY  0  . potassium chloride (K-DUR,KLOR-CON) 10 MEQ tablet Take 1 tablet (10 mEq total) by mouth daily. 30 tablet 1  . temazepam (RESTORIL) 7.5 MG capsule Take 1 capsule (7.5 mg total) by mouth at bedtime as needed for sleep. 20 capsule 0  . metFORMIN (GLUCOPHAGE) 500 MG tablet Take 1 tablet (500 mg total) by mouth 2 (two) times daily with a meal. 180 tablet 1  . oxyCODONE-acetaminophen (PERCOCET/ROXICET) 5-325 MG per tablet Take 1 tablet by mouth every 4 (four) hours as needed for severe pain. 20 tablet 0   Facility-Administered Medications Prior to Visit  Medication Dose Route Frequency Provider Last Rate Last  Dose  . sodium chloride 0.9 % injection 10 mL  10 mL Intravenous PRN [name], MD      . sodium chloride 0.9 % injection 10 mL  10 mL Intravenous PRN [name], MD   10 mL at 03/21/15 0849  . sodium chloride 0.9 % injection 10 mL  10 mL Intracatheter PRN [name], MD   10 mL at 04/25/15 [id number]    Review of Systems;  Patient denies headache, fevers, malaise, unintentional weight loss, skin rash, eye pain, sinus congestion and sinus pain, sore throat, dysphagia,  hemoptysis , cough, dyspnea, wheezing, chest pain, palpitations, orthopnea, edema, abdominal pain, nausea, melena, diarrhea, constipation, flank pain, dysuria, hematuria, urinary  Frequency, nocturia, numbness,  tingling, seizures,  Focal weakness, Loss of consciousness,  Tremor, insomnia, depression, anxiety, and suicidal ideation.      Objective:  BP 134/70 mmHg  Pulse 85  Temp(Src) 98.1 F (36.7 C) (Oral)  Resp 12  Ht 5' (1.524 m)  Wt 105 lb 4 oz (47.741 kg)  BMI 20.56 kg/m2  SpO2 98%  BP Readings from Last 3 Encounters:  06/05/15 134/70  06/04/15 139/83  05/28/15 129/74    Wt Readings from Last 3 Encounters:  06/05/15 105 lb 4 oz (47.741 kg)  06/04/15 105 lb 7.8 oz (47.85 kg)  05/28/15 105 lb 6.1 oz (47.8 kg)    General appearance: alert, cooperative and appears stated age Ears: normal TM's and external ear canals both ears Throat: lips, mucosa, and tongue normal; teeth and gums normal Neck: no adenopathy, no carotid bruit, supple, symmetrical, trachea midline and thyroid not enlarged, symmetric, no tenderness/mass/nodules Chest wall: changes consistent with inflammatory breast ca, s/p mastectomy  Lungs: clear to auscultation bilaterally Heart: regular rate and rhythm, S1, S2 normal, no murmur, click, rub or gallop Abdomen: soft, non-tender; bowel sounds normal; no masses,  no organomegaly Pulses: 2+ and symmetric Neuro: CNs 2-12 intact. DTRs 2+/4 in biceps, brachioradialis, patellars and achilles. Muscle strength 5/5 in upper and lower exremities. Fine resting tremor bilaterally both hands cerebellar function normal. Romberg negative.  No pronator drift.   Gait normal. .  Lab Results  Component Value Date   HGBA1C 6.0 04/03/2015   HGBA1C 6.3* 11/02/2014   HGBA1C 6.7* 08/14/2014    Lab Results  Component Value Date   CREATININE 0.46 06/04/2015   CREATININE 0.53 05/28/2015   CREATININE 0.43* 05/21/2015    Lab Results  Component Value Date   WBC 5.0 06/04/2015   HGB 11.0* 06/04/2015   HCT 33.0* 06/04/2015   PLT 166 06/04/2015   GLUCOSE 139* 06/04/2015   CHOL 173 08/14/2014   TRIG 82.0 08/14/2014   HDL 68.10 08/14/2014   LDLCALC 89 08/14/2014   ALT 13*  06/04/2015   AST 19 06/04/2015   NA 133* 06/04/2015   K 3.7 06/04/2015   CL 101 06/04/2015   CREATININE 0.46 06/04/2015   BUN 17 06/04/2015   CO2 24 06/04/2015   TSH 1.943 09/19/2014   INR 0.9 08/24/2014   HGBA1C 6.0 04/03/2015   MICROALBUR 1.6 04/03/2015    No results found.  Assessment & Plan:   Problem List Items Addressed This Visit    Hypertension    Elevated today due to emotional state.  No changes to regimen unless home BPs remain > 150. And can increas amlodipioen to 10 mg if needed  Continue metoprolol twice daily       Diabetes mellitus without complication ([place])    Currently well-controlled  on current medications .  hemoglobin A1c is at goal of less than 7.0 . Patient is reminded to schedule an annual eye exam and foot exam is normal today. Patient has no microalbuminuria. Patient is not taking statin therapy for CAD risk reduction and not on ACE/ARB for renal protection and hypertension  Due to terminal illness.  Lab Results  Component Value Date   HGBA1C 6.0 04/03/2015   Lab Results  Component Value Date   MICROALBUR 1.6 04/03/2015   Lab Results  Component Value Date   CHOL 173 08/14/2014   HDL 68.10 08/14/2014   LDLCALC 89 08/14/2014   TRIG 82.0 08/14/2014   CHOLHDL 3 08/14/2014    .  ./      Relevant Medications   metFORMIN (GLUCOPHAGE) 500 MG tablet   Breast cancer, stage 3 ([place]) - Primary    Discussed her diagnosis and her reaction to the news she received today. A total of 25 minutes of face to face time was spent with patient more than half of which was spent in counselling about the above mentioned conditions  and coordination of care .  She will continue carboplatinum and palliative XRT       Relevant Medications   oxyCODONE-acetaminophen (PERCOCET/ROXICET) 5-325 MG tablet      I have changed Ms. Senna's oxyCODONE-acetaminophen. I am also having her maintain her acetaminophen, ferrous sulfate, aspirin, diphenhydrAMINE, Calcium  Carbonate (CALTRATE 600 PO), cholecalciferol, Cyanocobalamin (VITAMIN B-12 IJ), amLODipine, loratadine, metoprolol tartrate, lidocaine-prilocaine, magic mouthwash, ondansetron, temazepam, HYDROcodone-acetaminophen, potassium chloride, and metFORMIN.  Meds ordered this encounter  Medications  . metFORMIN (GLUCOPHAGE) 500 MG tablet    Sig: Take 1 tablet (500 mg total) by mouth 2 (two) times daily with a meal.    Dispense:  180 tablet    Refill:  1  . oxyCODONE-acetaminophen (PERCOCET/ROXICET) 5-325 MG tablet    Sig: Take 1 tablet by mouth every 4 (four) hours as needed for severe pain.    Dispense:  30 tablet    Refill:  0    Medications Discontinued During This Encounter  Medication Reason  . metFORMIN (GLUCOPHAGE) 500 MG tablet Reorder  . oxyCODONE-acetaminophen (PERCOCET/ROXICET) 5-325 MG per tablet Reorder    Follow-up: Return in about 6 months (around 12/03/2015) for follow up diabetes.   Crecencio Mc, MD

## 2015-06-05 NOTE — Progress Notes (Signed)
Pre-visit discussion using our clinic review tool. No additional management support is needed unless otherwise documented below in the visit note.  

## 2015-06-05 NOTE — Patient Instructions (Addendum)
You can take up to 5 metformin tablets in a 24 hour period to control your blood sugar if it goes    Make sure you are  Taking the metoprolol twice daily,  This one affects the heart rate .   A normal heart rate is between 60 and 100    The amlodipine is taken just once daily  And does NOT affect heart rate.   You do not need an A1c until after March 6 .  I will send the letter to Dr Mike Gip  I will see you again in 6 months

## 2015-06-05 NOTE — Progress Notes (Signed)
  Oncology Nurse Navigator Documentation  Navigator Location: [facility] (06/05/15 1100) Navigator Encounter Type: Other (card) (06/05/15 1100)                                          Time Spent with Patient: 15 (06/05/15 1100)   Thinking of you card mailed to patient.

## 2015-06-06 ENCOUNTER — Ambulatory Visit
Admission: RE | Admit: 2015-06-06 | Discharge: 2015-06-06 | Disposition: A | Discharge status: Home / Self Care | Payer: PPO | Source: Ambulatory Visit | Attending: Radiation Oncology | Admitting: Radiation Oncology

## 2015-06-06 DIAGNOSIS — Z510 Encounter for antineoplastic radiation therapy: Secondary | ICD-10-CM | POA: Diagnosis not present

## 2015-06-07 ENCOUNTER — Ambulatory Visit
Admission: RE | Admit: 2015-06-07 | Discharge: 2015-06-07 | Disposition: A | Discharge status: Home / Self Care | Payer: PPO | Source: Ambulatory Visit | Attending: Radiation Oncology | Admitting: Radiation Oncology

## 2015-06-07 DIAGNOSIS — Z510 Encounter for antineoplastic radiation therapy: Secondary | ICD-10-CM | POA: Diagnosis not present

## 2015-06-07 NOTE — Assessment & Plan Note (Addendum)
Elevated today due to emotional state.  No changes to regimen unless home BPs remain > 150. And can increas amlodipioen to 10 mg if needed  Continue metoprolol twice daily

## 2015-06-07 NOTE — Assessment & Plan Note (Addendum)
Currently well-controlled on current medications .  hemoglobin A1c is at goal of less than 7.0 . Patient is reminded to schedule an annual eye exam and foot exam is normal today. Patient has no microalbuminuria. Patient is not taking statin therapy for CAD risk reduction and not on ACE/ARB for renal protection and hypertension  Due to terminal illness.  Lab Results  Component Value Date   HGBA1C 6.0 04/03/2015   Lab Results  Component Value Date   MICROALBUR 1.6 04/03/2015   Lab Results  Component Value Date   CHOL 173 08/14/2014   HDL 68.10 08/14/2014   LDLCALC 89 08/14/2014   TRIG 82.0 08/14/2014   CHOLHDL 3 08/14/2014    .  [name] Kitchen/

## 2015-06-07 NOTE — Assessment & Plan Note (Addendum)
Discussed her diagnosis and her reaction to the news she received today. A total of 25 minutes of face to face time was spent with patient more than half of which was spent in counselling about the above mentioned conditions  and coordination of care .  She will continue carboplatinum and palliative XRT

## 2015-06-08 ENCOUNTER — Ambulatory Visit
Admission: RE | Admit: 2015-06-08 | Discharge: 2015-06-08 | Disposition: A | Discharge status: Home / Self Care | Payer: PPO | Source: Ambulatory Visit | Attending: Radiation Oncology | Admitting: Radiation Oncology

## 2015-06-08 DIAGNOSIS — Z510 Encounter for antineoplastic radiation therapy: Secondary | ICD-10-CM | POA: Diagnosis not present

## 2015-06-11 ENCOUNTER — Other Ambulatory Visit: Payer: Self-pay | Admitting: Hematology and Oncology

## 2015-06-11 ENCOUNTER — Inpatient Hospital Stay (HOSPITAL_BASED_OUTPATIENT_CLINIC_OR_DEPARTMENT_OTHER): Payer: PPO | Admitting: Internal Medicine

## 2015-06-11 ENCOUNTER — Inpatient Hospital Stay: Payer: PPO

## 2015-06-11 ENCOUNTER — Telehealth: Payer: Self-pay | Admitting: *Deleted

## 2015-06-11 ENCOUNTER — Encounter: Payer: Self-pay | Admitting: Internal Medicine

## 2015-06-11 ENCOUNTER — Ambulatory Visit
Admission: RE | Admit: 2015-06-11 | Discharge: 2015-06-11 | Disposition: A | Discharge status: Home / Self Care | Payer: PPO | Source: Ambulatory Visit | Attending: Radiation Oncology | Admitting: Radiation Oncology

## 2015-06-11 VITALS — BP 130/78 | HR 91 | Temp 97.8°F | Resp 18 | Wt 107.1 lb

## 2015-06-11 DIAGNOSIS — Z7984 Long term (current) use of oral hypoglycemic drugs: Secondary | ICD-10-CM

## 2015-06-11 DIAGNOSIS — R21 Rash and other nonspecific skin eruption: Secondary | ICD-10-CM

## 2015-06-11 DIAGNOSIS — Z171 Estrogen receptor negative status [ER-]: Secondary | ICD-10-CM

## 2015-06-11 DIAGNOSIS — C50911 Malignant neoplasm of unspecified site of right female breast: Secondary | ICD-10-CM

## 2015-06-11 DIAGNOSIS — Z5111 Encounter for antineoplastic chemotherapy: Secondary | ICD-10-CM | POA: Diagnosis not present

## 2015-06-11 DIAGNOSIS — I10 Essential (primary) hypertension: Secondary | ICD-10-CM

## 2015-06-11 DIAGNOSIS — Z510 Encounter for antineoplastic radiation therapy: Secondary | ICD-10-CM | POA: Diagnosis not present

## 2015-06-11 DIAGNOSIS — E785 Hyperlipidemia, unspecified: Secondary | ICD-10-CM

## 2015-06-11 DIAGNOSIS — D509 Iron deficiency anemia, unspecified: Secondary | ICD-10-CM

## 2015-06-11 DIAGNOSIS — Z79899 Other long term (current) drug therapy: Secondary | ICD-10-CM

## 2015-06-11 DIAGNOSIS — Z7982 Long term (current) use of aspirin: Secondary | ICD-10-CM

## 2015-06-11 DIAGNOSIS — M129 Arthropathy, unspecified: Secondary | ICD-10-CM

## 2015-06-11 DIAGNOSIS — C50919 Malignant neoplasm of unspecified site of unspecified female breast: Secondary | ICD-10-CM

## 2015-06-11 DIAGNOSIS — C779 Secondary and unspecified malignant neoplasm of lymph node, unspecified: Secondary | ICD-10-CM | POA: Diagnosis not present

## 2015-06-11 DIAGNOSIS — E538 Deficiency of other specified B group vitamins: Secondary | ICD-10-CM

## 2015-06-11 DIAGNOSIS — G479 Sleep disorder, unspecified: Secondary | ICD-10-CM

## 2015-06-11 DIAGNOSIS — D649 Anemia, unspecified: Secondary | ICD-10-CM

## 2015-06-11 DIAGNOSIS — Z87442 Personal history of urinary calculi: Secondary | ICD-10-CM

## 2015-06-11 DIAGNOSIS — I2510 Atherosclerotic heart disease of native coronary artery without angina pectoris: Secondary | ICD-10-CM

## 2015-06-11 DIAGNOSIS — E119 Type 2 diabetes mellitus without complications: Secondary | ICD-10-CM

## 2015-06-11 LAB — COMPREHENSIVE METABOLIC PANEL
ALT: 13 U/L — ABNORMAL LOW (ref 14–54)
AST: 17 U/L (ref 15–41)
Albumin: 4.2 g/dL (ref 3.5–5.0)
Alkaline Phosphatase: 60 U/L (ref 38–126)
Anion gap: 6 (ref 5–15)
BUN: 15 mg/dL (ref 6–20)
CO2: 23 mmol/L (ref 22–32)
Calcium: 9.2 mg/dL (ref 8.9–10.3)
Chloride: 107 mmol/L (ref 101–111)
Creatinine, Ser: 0.47 mg/dL (ref 0.44–1.00)
GFR calc Af Amer: >60 mL/min (ref >60)
GFR calc non Af Amer: >60 mL/min (ref >60)
Glucose, Bld: 144 mg/dL — ABNORMAL HIGH (ref 65–99)
Potassium: 3.8 mmol/L (ref 3.5–5.1)
Sodium: 136 mmol/L (ref 135–145)
Total Bilirubin: 0.5 mg/dL (ref 0.3–1.2)
Total Protein: 7 g/dL (ref 6.5–8.1)

## 2015-06-11 LAB — CBC WITH DIFFERENTIAL/PLATELET
Basophils Absolute: 0 10*3/uL (ref 0–0.1)
Basophils Relative: 1 %
Eosinophils Absolute: 0 10*3/uL (ref 0–0.7)
Eosinophils Relative: 1 %
HCT: 33.2 % — ABNORMAL LOW (ref 35.0–47.0)
Hemoglobin: 11 g/dL — ABNORMAL LOW (ref 12.0–16.0)
Lymphocytes Relative: 27 %
Lymphs Abs: 1.3 10*3/uL (ref 1.0–3.6)
MCH: 32 pg (ref 26.0–34.0)
MCHC: 33.2 g/dL (ref 32.0–36.0)
MCV: 96.4 fL (ref 80.0–100.0)
Monocytes Absolute: 0.4 10*3/uL (ref 0.2–0.9)
Monocytes Relative: 9 %
Neutro Abs: 3.1 10*3/uL (ref 1.4–6.5)
Neutrophils Relative %: 62 %
Platelets: 138 10*3/uL — ABNORMAL LOW (ref 150–440)
RBC: 3.45 MIL/uL — ABNORMAL LOW (ref 3.80–5.20)
RDW: 17.3 % — ABNORMAL HIGH (ref 11.5–14.5)
WBC: 4.9 10*3/uL (ref 3.6–11.0)

## 2015-06-11 LAB — MAGNESIUM: Magnesium: 1.8 mg/dL (ref 1.7–2.4)

## 2015-06-11 MED ORDER — HEPARIN SOD (PORK) LOCK FLUSH 100 UNIT/ML IV SOLN
INTRAVENOUS | Status: AC
  Filled 2015-06-11: qty 5

## 2015-06-11 MED ORDER — TEMAZEPAM 7.5 MG PO CAPS: ORAL_CAPSULE | ORAL | Status: DC

## 2015-06-11 MED ORDER — PALONOSETRON HCL INJECTION 0.25 MG/5ML
0.2500 mg | Freq: Once | INTRAVENOUS | Status: AC
  Administered 2015-06-11: 0.25 mg via INTRAVENOUS
  Filled 2015-06-11: qty 5

## 2015-06-11 MED ORDER — SODIUM CHLORIDE 0.9% FLUSH
10.0000 mL | INTRAVENOUS | Status: DC | PRN
  Administered 2015-06-11: 10 mL via INTRAVENOUS
  Filled 2015-06-11: qty 10

## 2015-06-11 MED ORDER — HYDROCODONE-ACETAMINOPHEN 5-325 MG PO TABS: 1.0000 | ORAL_TABLET | Freq: Four times a day (QID) | ORAL | Status: DC | PRN

## 2015-06-11 MED ORDER — DEXAMETHASONE SODIUM PHOSPHATE 100 MG/10ML IJ SOLN
10.0000 mg | Freq: Once | INTRAMUSCULAR | Status: AC
  Administered 2015-06-11: 10 mg via INTRAVENOUS
  Filled 2015-06-11: qty 1

## 2015-06-11 MED ORDER — HEPARIN SOD (PORK) LOCK FLUSH 100 UNIT/ML IV SOLN
500.0000 [IU] | Freq: Once | INTRAVENOUS | Status: AC
  Administered 2015-06-11: 500 [IU] via INTRAVENOUS

## 2015-06-11 MED ORDER — SODIUM CHLORIDE 0.9 % IV SOLN
121.8000 mg | Freq: Once | INTRAVENOUS | Status: AC
  Administered 2015-06-11: 120 mg via INTRAVENOUS
  Filled 2015-06-11: qty 12

## 2015-06-11 MED ORDER — SODIUM CHLORIDE 0.9 % IV SOLN
Freq: Once | INTRAVENOUS | Status: AC
  Administered 2015-06-11: 16:00:00 via INTRAVENOUS
  Filled 2015-06-11: qty 1000

## 2015-06-11 NOTE — Telephone Encounter (Signed)
Unable to refill electronically so called pharmacy with 1-2 pills at night if needed for sleep of remeron 7.5 mg.  # 60 and no refills

## 2015-06-11 NOTE — Progress Notes (Signed)
[facility] day:  06/11/2015   Chief Complaint: Dawn Wiley is an 81 y.o. female with stage IIIC right breast cancer who is seen for assessment prior to week # 4 carboplatin and radiation.  HPI:  The patient returns to our clinic for a follow-up visit. She continues to tolerate carboplatin and radiation remarkably well without any significant complaints. Her appetite and energy level are acceptable, she denies shortness of breath, chest pain, nausea, vomiting, diarrhea, constipation. She has noticed further reduction of the most pronounced skin lesions. However, she also has noticed development end of faint rash outside of the radiation field on the left chest and right axillary area. Past Medical History  Diagnosis Date  . Kidney stones   . Hyperlipidemia   . Coronary artery disease     Coronary calcifications noted on CT scan  . Diabetes mellitus without complication ([place])   . Nephrolithiasis   . Hypertension   . Arthritis   . Anemia   . Tachycardia     Dr. Fletcher Anon, cardiologist  . [facility])     breast (right)  . PONV (postoperative nausea and vomiting)   . Rash     back  . Breast cancer [facility])     Past Surgical History  Procedure Laterality Date  . Temporomandibular joint surgery    . Cataract extraction      bilateral  . Breast surgery      breast biopsy X 3  . Eye surgery      bilateral cataract extraction  . Mastectomy modified radical Right 08/31/2014    Procedure: MASTECTOMY MODIFIED RADICAL;  Surgeon: Molly Maduro, MD;  Location: [facility] ORS;  Service: General;  Laterality: Right;  . Portacath placement Left 10/20/2014    Procedure: INSERTION PORT-A-CATH;  Surgeon: Marlyce Huge, MD;  Location: [facility] ORS;  Service: General;  Laterality: Left;    Family History  Problem Relation Age of Onset  . Peripheral Artery Disease Sister     carotid artery stenosis   . Heart disease Sister   . Diabetes Sister      Social History:  reports that she has never smoked. She has never used smokeless tobacco. She reports that she does not drink alcohol or use illicit drugs.  The patient is alone today.  Her daughter's phone number is [phone].  Allergies: No Known Allergies  Current Medications: Current Outpatient Prescriptions  Medication Sig Dispense Refill  . acetaminophen (TYLENOL) 500 MG tablet Take 500 mg by mouth every 6 (six) hours as needed for mild pain or moderate pain.     [name] Kitchen amLODipine (NORVASC) 5 MG tablet take 1 tablet by mouth once daily 90 tablet 1  . aspirin 81 MG tablet Take 81 mg by mouth every other day.    . Calcium Carbonate (CALTRATE 600 PO) Take 1,200 mg by mouth daily.     . cholecalciferol (VITAMIN D) 400 UNITS TABS tablet Take 800 Units by mouth.    . Cyanocobalamin (VITAMIN B-12 IJ) Inject as directed every 30 (thirty) days.     . diphenhydrAMINE (BENADRYL) 25 mg capsule Take 25 mg by mouth every 6 (six) hours as needed for allergies.    . ferrous sulfate 325 (65 FE) MG tablet Take 325 mg by mouth daily with breakfast.     . HYDROcodone-acetaminophen (NORCO/VICODIN) 5-325 MG tablet Take 1 tablet by mouth every 6 (six) hours as needed for moderate pain. 30 tablet 0  . lidocaine-prilocaine (EMLA) cream  Apply 1 application topically as needed. Apply 1-2 hours prior to chemotherapy 30 g 1  . loratadine (CLARITIN) 10 MG tablet Take 1 tablet (10 mg total) by mouth daily. 30 tablet 0  . magic mouthwash SOLN Take 5 mLs by mouth 4 (four) times daily. 200 mL 1  . metFORMIN (GLUCOPHAGE) 500 MG tablet Take 1 tablet (500 mg total) by mouth 2 (two) times daily with a meal. 180 tablet 1  . metoprolol tartrate (LOPRESSOR) 25 MG tablet Take 1 tablet (25 mg total) by mouth 2 (two) times daily. 180 tablet 2  . ondansetron (ZOFRAN) 8 MG tablet take 1 tablet by mouth twice a day if needed .[name] KitchenMarland KitchenSTART ON THE THIRD DAY AFTER CHEMOTHERAPY  0  . oxyCODONE-acetaminophen (PERCOCET/ROXICET) 5-325 MG  tablet Take 1 tablet by mouth every 4 (four) hours as needed for severe pain. 30 tablet 0  . potassium chloride (K-DUR,KLOR-CON) 10 MEQ tablet Take 1 tablet (10 mEq total) by mouth daily. 30 tablet 1  . temazepam (RESTORIL) 7.5 MG capsule Take 1 capsule (7.5 mg total) by mouth at bedtime as needed for sleep. 20 capsule 0   No current facility-administered medications for this visit.   Facility-Administered Medications Ordered in Other Visits  Medication Dose Route Frequency Provider Last Rate Last Dose  . sodium chloride 0.9 % injection 10 mL  10 mL Intravenous PRN [name], MD      . sodium chloride 0.9 % injection 10 mL  10 mL Intravenous PRN [name], MD   10 mL at 03/21/15 0849  . sodium chloride 0.9 % injection 10 mL  10 mL Intracatheter PRN [name], MD   10 mL at 04/25/15 5364    Review of Systems:  GENERAL: No problems. No fevers or sweats. Weight down 2 pounds. PERFORMANCE STATUS (ECOG): 1 HEENT: No visual changes, runny nose, sore throat, or tenderness. Lungs: No shortness of breath or cough. No hemoptysis. Cardiac: No chest pain, palpitations, orthopnea, or PND. GI: No nausea, vomiting, diarrhea, constipation, melena or hematochezia. GU: No urgency, frequency, dysuria, or hematuria. Musculoskeletal: No back pain. No joint pain. No muscle tenderness. Extremities: No pain or swelling. Skin: Chest wall lesions.  Area on right arm, growing. Neuro: No headache, numbness or weakness, balance or coordination issues. Endocrine: Diabetes.  No thyroid issues, hot flashes or night sweats. Psych: No mood changes, depression or anxiety. Pain: No focal pain. Review of systems: All other systems reviewed and found to be negative.  Physical Exam: Blood pressure 130/78, pulse 91, temperature 97.8 F (36.6 C), temperature source Tympanic, resp. rate 18, weight 107 lb 2.3 oz (48.6 kg). GENERAL: Thin elderly woman sitting comfortably in the  exam room in no acute distress. MENTAL STATUS: Alert and oriented to person, place and time. HEAD: Short silver wig. Normocephalic, atraumatic, face symmetric, no Cushingoid features. EYES: Blue eyes. Pupils equal round and reactive to light and accomodation.  No conjunctivitis or scleral icterus. ENT:  Oropharynx clear without lesion.  Tongue normal. Mucous membranes moist.  RESPIRATORY:  Clear to auscultation without rales, wheezes or rhonchi. CARDIOVASCULAR:  Regular rate and rhythm without murmur, rub or gallop. BREAST: Right mastectomy. Breast lesions dry.  Nodular lesion measuring 6.5 x 5 cm medially.  Confluent nodular ruddy lesions extending laterally under axillae (15 cm long x 7 cm high and tapering laterally to 2 cm).  Medial to lateral skin changes approximately 23 cm. ABDOMEN:  Soft, non-tender, with active bowel sounds, and no hepatosplenomegaly.  No  masses. SKIN: Chest wall lesions as above.  Upper right arm with 3.5 cm pink slightly palpable irregularity. Right far lateral back (near axillae) with 4 x 2 cm very faint erythema. Overall improving. EXTREMITIES: No edema, no skin discoloration or tenderness. No palpable cords. LYMPH NODES: No palpable axillary adenopathy  NEUROLOGICAL: Unremarkable. PSYCH: Appropriate.   Clinical Support on 06/11/2015  Component Date Value Ref Range Status  . WBC 06/11/2015 4.9  3.6 - 11.0 K/uL Final  . RBC 06/11/2015 3.45* 3.80 - 5.20 MIL/uL Final  . Hemoglobin 06/11/2015 11.0* 12.0 - 16.0 g/dL Final  . HCT 06/11/2015 33.2* 35.0 - 47.0 % Final  . MCV 06/11/2015 96.4  80.0 - 100.0 fL Final  . MCH 06/11/2015 32.0  26.0 - 34.0 pg Final  . MCHC 06/11/2015 33.2  32.0 - 36.0 g/dL Final  . RDW 06/11/2015 17.3* 11.5 - 14.5 % Final  . Platelets 06/11/2015 138* 150 - 440 K/uL Final  . Neutrophils Relative % 06/11/2015 62   Final  . Neutro Abs 06/11/2015 3.1  1.4 - 6.5 K/uL Final  . Lymphocytes Relative 06/11/2015 27   Final  . Lymphs Abs  06/11/2015 1.3  1.0 - 3.6 K/uL Final  . Monocytes Relative 06/11/2015 9   Final  . Monocytes Absolute 06/11/2015 0.4  0.2 - 0.9 K/uL Final  . Eosinophils Relative 06/11/2015 1   Final  . Eosinophils Absolute 06/11/2015 0.0  0 - 0.7 K/uL Final  . Basophils Relative 06/11/2015 1   Final  . Basophils Absolute 06/11/2015 0.0  0 - 0.1 K/uL Final  . Magnesium 06/11/2015 1.8  1.7 - 2.4 mg/dL Final  . Sodium 06/11/2015 136  135 - 145 mmol/L Final  . Potassium 06/11/2015 3.8  3.5 - 5.1 mmol/L Final  . Chloride 06/11/2015 107  101 - 111 mmol/L Final  . CO2 06/11/2015 23  22 - 32 mmol/L Final  . Glucose, Bld 06/11/2015 144* 65 - 99 mg/dL Final  . BUN 06/11/2015 15  6 - 20 mg/dL Final  . Creatinine, Ser 06/11/2015 0.47  0.44 - 1.00 mg/dL Final  . Calcium 06/11/2015 9.2  8.9 - 10.3 mg/dL Final  . Total Protein 06/11/2015 7.0  6.5 - 8.1 g/dL Final  . Albumin 06/11/2015 4.2  3.5 - 5.0 g/dL Final  . AST 06/11/2015 17  15 - 41 U/L Final  . ALT 06/11/2015 13* 14 - 54 U/L Final  . Alkaline Phosphatase 06/11/2015 60  38 - 126 U/L Final  . Total Bilirubin 06/11/2015 0.5  0.3 - 1.2 mg/dL Final  . GFR calc non Af Amer 06/11/2015 >60  >60 mL/min Final  . GFR calc Af Amer 06/11/2015 >60  >60 mL/min Final   Comment: (NOTE) The eGFR has been calculated using the CKD EPI equation. This calculation has not been validated in all clinical situations. eGFR's persistently <60 mL/min signify possible Chronic Kidney Disease.   . Anion gap 06/11/2015 6  5 - 15 Final    Assessment:  Dawn Wiley is an 81 y.o. female with stage IIIC right breast cancer status post mastectomy and axillary lymph node dissection on 08/31/2014. 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 Carcinoma involved skeletal muscle and dermal lymphatics. Tumor was less than 0.5 mm from the deep margin. Thirteen of 15 lymph nodes were involved. The largest metastatic focus was 2 cm.  Tumor is triple negative (ER negative, PR negative, and HER-2/neu negative).  Tumor is androgen receptor negative.  Pathologic  stage was [id number].  PET scan on 09/21/2014 revealed postoperative changes in the right chest with no suspicious findings or residual tumor. Bone scan on 10/11/2014 revealed no evidence of metastatic disease.  Bone density study on 10/02/2014 revealed a T score of -4.7 in the forearm consistent with osteoporosis. T-score was less than 2.5 in the spine or hip. Echocardiogram on 10/02/2014 revealed ejection fraction of 60-65%.  She has iron deficiency anemia. Labs on 09/19/2014 revealed a hematocrit of 29.9, hemoglobin 8.7, MCV 69.7, ferritin 9, and TIBC 616. B12 was low (265) with a prior history of B12 deficiency and need for supplementation (stopped 06/2014). She began B12 on 10/12/2014 (last 04/25/2015).  Folate was normal. Her diet is modest. She has never had a colonoscopy. She denies any melena or hematochezia.  She takes 1 iron pill a day (additional iron causes diarrhea).  Ferritin was 116 on 04/25/2015.  Chest wall biopsy x 3 (lateral, medial, and middle sites) on 11/08/2014 revealed dermal lymphatic involvement by invasive mammary carcinoma with micropapillary features.    She has aggressive disease with growth despite several chemotherapeutic agents.  She received 5 weeks of Taxol (11/03/2014 - 12/01/2014).  She received 1 week of adriamycin (12/14/2014).  She received 1 cycle of carboplatin (12/28/2014).  After carboplatin, disease appeared to stabilize, but with 2 weeks off of therapy, her disease grew again (medial lesion larger and weaping).   She received 3 cycles of carboplatin and Taxol (01/18/2015 - 03/02/2015).  Chest wall lesions initially decreased then began to grow.  She received 2 cycles of adriamycin and Cytoxan (03/26/2015 - 04/11/2015) with Neulasta support.  ANC nadir was 200 on day 9 (04/03/2015).  Cycle #2 was complicated by mouth sores,  decreased oral intake, and weight loss. Chest wall disease began to grow before cycle #3 could be administered.  PET scan on 04/26/2015 revealed extensive recurrence about the right chest.  The far lateral component measured 4.2 x 2.2 cm (S.U.V.13.5).  A portion superficial to the right-side of the sternum measured 5.2 x 2.2 cm (S.U.V. 9.1).  There was diffuse more inferior and lateral chest wall soft tissue thickening and hypermetabolism.  CA27.29 was 118.4 on 05/02/2015.  She is week # 5 of concurrent weekly carboplatin and radiation (began 05/14/2015), plan of 5 weeks of radiation.  She has been able to tolerate chemoradiation very well with significant improvement of right chest lesions. She appears to have additional lesions developing outside of the radiation field. This case was discussed with Dr. Donella Stade, who has previously been noticed such a development. We will need to complete the radiation to the most advanced lesions, and then address recently developing lesions.    Plan: 1.  Labs today:  CBC with diff, CMP. 2.  Week # 5 carboplatin today. Radiation therapy will be completed this week. 3.  Medical photographs. 4.  Follow-up PDL-L1 testing-have not been reported yet.   5.  Continue potassium chloride 10 meq po q day. 6.  B12 monthly. 7.  RTC in 1 week for MD assess, labs (CBC with diff, CMP),   Roxana Hires, MD  06/11/2015, 3:05 PM

## 2015-06-11 NOTE — Progress Notes (Signed)
Pt has gained a little weight, eating ok, drinking ok, bowels ok. No pain but at night when she lays down she uses pain pill and sleeping pill because she has pulling of muscle and skin and itching to the spots of cancer under her arm and coming out of her breast. The days she comes for chemo it is the worse fo rher sleep due to steroids and requesting refill of pain med and inc. Of sleeping pill which is restoril.  i have passed the info to MD

## 2015-06-12 ENCOUNTER — Ambulatory Visit
Admission: RE | Admit: 2015-06-12 | Discharge: 2015-06-12 | Disposition: A | Discharge status: Home / Self Care | Payer: PPO | Source: Ambulatory Visit | Attending: Radiation Oncology | Admitting: Radiation Oncology

## 2015-06-12 DIAGNOSIS — Z510 Encounter for antineoplastic radiation therapy: Secondary | ICD-10-CM | POA: Diagnosis not present

## 2015-06-12 LAB — CANCER ANTIGEN 27.29: CA 27.29: 92.9 U/mL — ABNORMAL HIGH (ref 0.0–38.6)

## 2015-06-13 ENCOUNTER — Ambulatory Visit
Admission: RE | Admit: 2015-06-13 | Discharge: 2015-06-13 | Disposition: A | Discharge status: Home / Self Care | Payer: PPO | Source: Ambulatory Visit | Attending: Radiation Oncology | Admitting: Radiation Oncology

## 2015-06-13 DIAGNOSIS — Z510 Encounter for antineoplastic radiation therapy: Secondary | ICD-10-CM | POA: Diagnosis not present

## 2015-06-14 ENCOUNTER — Ambulatory Visit
Admission: RE | Admit: 2015-06-14 | Discharge: 2015-06-14 | Disposition: A | Discharge status: Home / Self Care | Payer: PPO | Source: Ambulatory Visit | Attending: Radiation Oncology | Admitting: Radiation Oncology

## 2015-06-14 DIAGNOSIS — Z510 Encounter for antineoplastic radiation therapy: Secondary | ICD-10-CM | POA: Diagnosis not present

## 2015-06-15 ENCOUNTER — Ambulatory Visit
Admission: RE | Admit: 2015-06-15 | Discharge: 2015-06-15 | Disposition: A | Discharge status: Home / Self Care | Payer: PPO | Source: Ambulatory Visit | Attending: Radiation Oncology | Admitting: Radiation Oncology

## 2015-06-15 DIAGNOSIS — Z510 Encounter for antineoplastic radiation therapy: Secondary | ICD-10-CM | POA: Diagnosis not present

## 2015-06-18 ENCOUNTER — Other Ambulatory Visit: Payer: PPO

## 2015-06-18 ENCOUNTER — Ambulatory Visit: Payer: PPO

## 2015-06-18 ENCOUNTER — Ambulatory Visit: Payer: PPO | Admitting: Hematology and Oncology

## 2015-06-18 ENCOUNTER — Encounter: Payer: Self-pay | Admitting: Hematology and Oncology

## 2015-06-19 ENCOUNTER — Encounter: Payer: Self-pay | Admitting: Hematology and Oncology

## 2015-06-19 ENCOUNTER — Other Ambulatory Visit: Payer: PPO

## 2015-06-19 ENCOUNTER — Inpatient Hospital Stay: Payer: PPO

## 2015-06-19 ENCOUNTER — Ambulatory Visit: Payer: PPO | Admitting: Hematology and Oncology

## 2015-06-19 ENCOUNTER — Inpatient Hospital Stay (HOSPITAL_BASED_OUTPATIENT_CLINIC_OR_DEPARTMENT_OTHER): Payer: PPO | Admitting: Hematology and Oncology

## 2015-06-19 ENCOUNTER — Inpatient Hospital Stay: Payer: PPO | Attending: Hematology and Oncology

## 2015-06-19 ENCOUNTER — Ambulatory Visit: Payer: PPO

## 2015-06-19 ENCOUNTER — Other Ambulatory Visit: Payer: Self-pay | Admitting: Hematology and Oncology

## 2015-06-19 VITALS — BP 155/75 | HR 103 | Temp 98.2°F | Ht 61.0 in | Wt 106.5 lb

## 2015-06-19 DIAGNOSIS — E119 Type 2 diabetes mellitus without complications: Secondary | ICD-10-CM | POA: Insufficient documentation

## 2015-06-19 DIAGNOSIS — E785 Hyperlipidemia, unspecified: Secondary | ICD-10-CM | POA: Insufficient documentation

## 2015-06-19 DIAGNOSIS — Z79899 Other long term (current) drug therapy: Secondary | ICD-10-CM | POA: Diagnosis not present

## 2015-06-19 DIAGNOSIS — Z923 Personal history of irradiation: Secondary | ICD-10-CM | POA: Diagnosis not present

## 2015-06-19 DIAGNOSIS — E538 Deficiency of other specified B group vitamins: Secondary | ICD-10-CM | POA: Diagnosis not present

## 2015-06-19 DIAGNOSIS — I2510 Atherosclerotic heart disease of native coronary artery without angina pectoris: Secondary | ICD-10-CM | POA: Diagnosis not present

## 2015-06-19 DIAGNOSIS — R21 Rash and other nonspecific skin eruption: Secondary | ICD-10-CM

## 2015-06-19 DIAGNOSIS — M810 Age-related osteoporosis without current pathological fracture: Secondary | ICD-10-CM | POA: Insufficient documentation

## 2015-06-19 DIAGNOSIS — I10 Essential (primary) hypertension: Secondary | ICD-10-CM | POA: Diagnosis not present

## 2015-06-19 DIAGNOSIS — Z9221 Personal history of antineoplastic chemotherapy: Secondary | ICD-10-CM | POA: Diagnosis not present

## 2015-06-19 DIAGNOSIS — Z7984 Long term (current) use of oral hypoglycemic drugs: Secondary | ICD-10-CM | POA: Insufficient documentation

## 2015-06-19 DIAGNOSIS — D509 Iron deficiency anemia, unspecified: Secondary | ICD-10-CM | POA: Diagnosis not present

## 2015-06-19 DIAGNOSIS — C50911 Malignant neoplasm of unspecified site of right female breast: Secondary | ICD-10-CM

## 2015-06-19 DIAGNOSIS — Z7982 Long term (current) use of aspirin: Secondary | ICD-10-CM | POA: Diagnosis not present

## 2015-06-19 DIAGNOSIS — M129 Arthropathy, unspecified: Secondary | ICD-10-CM | POA: Insufficient documentation

## 2015-06-19 DIAGNOSIS — Z171 Estrogen receptor negative status [ER-]: Secondary | ICD-10-CM | POA: Insufficient documentation

## 2015-06-19 DIAGNOSIS — G4700 Insomnia, unspecified: Secondary | ICD-10-CM | POA: Diagnosis not present

## 2015-06-19 DIAGNOSIS — Z87442 Personal history of urinary calculi: Secondary | ICD-10-CM | POA: Insufficient documentation

## 2015-06-19 DIAGNOSIS — C779 Secondary and unspecified malignant neoplasm of lymph node, unspecified: Secondary | ICD-10-CM | POA: Diagnosis not present

## 2015-06-19 LAB — COMPREHENSIVE METABOLIC PANEL
ALK PHOS: 56 U/L (ref 38–126)
ALT: 11 U/L — AB (ref 14–54)
AST: 19 U/L (ref 15–41)
Albumin: 4.3 g/dL (ref 3.5–5.0)
Anion gap: 7 (ref 5–15)
BUN: 13 mg/dL (ref 6–20)
CALCIUM: 9.8 mg/dL (ref 8.9–10.3)
CO2: 26 mmol/L (ref 22–32)
CREATININE: 0.48 mg/dL (ref 0.44–1.00)
Chloride: 103 mmol/L (ref 101–111)
GFR calc non Af Amer: >60 mL/min (ref >60)
GLUCOSE: 151 mg/dL — AB (ref 65–99)
Potassium: 4.2 mmol/L (ref 3.5–5.1)
SODIUM: 136 mmol/L (ref 135–145)
Total Bilirubin: 0.6 mg/dL (ref 0.3–1.2)
Total Protein: 7.2 g/dL (ref 6.5–8.1)

## 2015-06-19 LAB — CBC WITH DIFFERENTIAL/PLATELET
BASOS ABS: 0 10*3/uL (ref 0–0.1)
BASOS PCT: 1 %
EOS ABS: 0 10*3/uL (ref 0–0.7)
Eosinophils Relative: 1 %
HCT: 34.3 % — ABNORMAL LOW (ref 35.0–47.0)
Hemoglobin: 11.4 g/dL — ABNORMAL LOW (ref 12.0–16.0)
Lymphocytes Relative: 24 %
Lymphs Abs: 0.9 10*3/uL — ABNORMAL LOW (ref 1.0–3.6)
MCH: 32.3 pg (ref 26.0–34.0)
MCHC: 33.1 g/dL (ref 32.0–36.0)
MCV: 97.5 fL (ref 80.0–100.0)
MONO ABS: 0.4 10*3/uL (ref 0.2–0.9)
MONOS PCT: 10 %
NEUTROS PCT: 66 %
Neutro Abs: 2.4 10*3/uL (ref 1.4–6.5)
Platelets: 127 10*3/uL — ABNORMAL LOW (ref 150–440)
RBC: 3.52 MIL/uL — ABNORMAL LOW (ref 3.80–5.20)
RDW: 17 % — ABNORMAL HIGH (ref 11.5–14.5)
WBC: 3.7 10*3/uL (ref 3.6–11.0)

## 2015-06-19 MED ORDER — CYANOCOBALAMIN 1000 MCG/ML IJ SOLN
1000.0000 ug | Freq: Once | INTRAMUSCULAR | Status: AC
  Administered 2015-06-19: 1000 ug via INTRAMUSCULAR
  Filled 2015-06-19: qty 1

## 2015-06-19 NOTE — Progress Notes (Signed)
Clinic day:  06/19/2015   Chief Complaint: Dawn Wiley is an 81 y.o. female with stage IIIC right breast cancer who is seen for reassessment after 5 weeks of concurrent carboplatin and radiation.  HPI: The patient was last seen in the medical oncology clinic on 05/28/2015.  At that time, she received week #3 concurrent chemotherapy and radiation.  She noted no new complaints.  She denied any nausea, vomiting, or diarrhea.  She denied any pain.  Her energy level was good.  She had lost 2 pounds.   She saw Dr. Roxana Hires on 06/04/2015 and 06/11/2015.  She was noted to have decreasing chest wall mass.  She did notice a faint rash outside of the radiation field on her left chest and right axillary area.  Photographs were taken.    Radiation completed on 06/15/2015.  She has continued to notice areas outside of the radiation field popping up.  She notices an area around her back on the right side, right upper arm, and toward her left breast.  The area that received radiation has done well.  She notes some difficulties with sleep.  Pain is well controlled.  Past Medical History  Diagnosis Date  . Kidney stones   . Hyperlipidemia   . Coronary artery disease     Coronary calcifications noted on CT scan  . Diabetes mellitus without complication ([place])   . Nephrolithiasis   . Hypertension   . Arthritis   . Anemia   . Tachycardia     Dr. Fletcher Anon, cardiologist  . Cancer [facility])     breast (right)  . PONV (postoperative nausea and vomiting)   . Rash     back  . Breast cancer [facility])     Past Surgical History  Procedure Laterality Date  . Temporomandibular joint surgery    . Cataract extraction      bilateral  . Breast surgery      breast biopsy X 3  . Eye surgery      bilateral cataract extraction  . Mastectomy modified radical Right 08/31/2014    Procedure: MASTECTOMY MODIFIED RADICAL;  Surgeon: Molly Maduro, MD;  Location: [facility]  ORS;  Service: General;  Laterality: Right;  . Portacath placement Left 10/20/2014    Procedure: INSERTION PORT-A-CATH;  Surgeon: Marlyce Huge, MD;  Location: [facility] ORS;  Service: General;  Laterality: Left;    Family History  Problem Relation Age of Onset  . Peripheral Artery Disease Sister     carotid artery stenosis   . Heart disease Sister   . Diabetes Sister     Social History:  reports that she has never smoked. She has never used smokeless tobacco. She reports that she does not drink alcohol or use illicit drugs.  The patient is accompanied by her daughter today.  Her daughter's phone number is [phone].  Allergies: No Known Allergies  Current Medications: Current Outpatient Prescriptions  Medication Sig Dispense Refill  . acetaminophen (TYLENOL) 500 MG tablet Take 500 mg by mouth every 6 (six) hours as needed for mild pain or moderate pain.     [name] Kitchen amLODipine (NORVASC) 5 MG tablet take 1 tablet by mouth once daily 90 tablet 1  . aspirin 81 MG tablet Take 81 mg by mouth every other day.    . Calcium Carbonate (CALTRATE 600 PO) Take 1,200 mg by mouth daily.     . cholecalciferol (VITAMIN D) 400 UNITS TABS tablet Take 800 Units by  mouth.    . Cyanocobalamin (VITAMIN B-12 IJ) Inject as directed every 30 (thirty) days.     . diphenhydrAMINE (BENADRYL) 25 mg capsule Take 25 mg by mouth every 6 (six) hours as needed for allergies.    . ferrous sulfate 325 (65 FE) MG tablet Take 325 mg by mouth daily with breakfast.     . HYDROcodone-acetaminophen (NORCO/VICODIN) 5-325 MG tablet Take 1 tablet by mouth every 6 (six) hours as needed for moderate pain. 30 tablet 0  . lidocaine-prilocaine (EMLA) cream Apply 1 application topically as needed. Apply 1-2 hours prior to chemotherapy 30 g 1  . loratadine (CLARITIN) 10 MG tablet Take 1 tablet (10 mg total) by mouth daily. 30 tablet 0  . magic mouthwash SOLN Take 5 mLs by mouth 4 (four) times daily. 200 mL 1  . metFORMIN (GLUCOPHAGE)  500 MG tablet Take 1 tablet (500 mg total) by mouth 2 (two) times daily with a meal. 180 tablet 1  . metoprolol tartrate (LOPRESSOR) 25 MG tablet Take 1 tablet (25 mg total) by mouth 2 (two) times daily. 180 tablet 2  . ondansetron (ZOFRAN) 8 MG tablet take 1 tablet by mouth twice a day if needed .[name] KitchenMarland KitchenSTART ON THE THIRD DAY AFTER CHEMOTHERAPY  0  . oxyCODONE-acetaminophen (PERCOCET/ROXICET) 5-325 MG tablet Take 1 tablet by mouth every 4 (four) hours as needed for severe pain. 30 tablet 0  . potassium chloride (K-DUR,KLOR-CON) 10 MEQ tablet Take 1 tablet (10 mEq total) by mouth daily. 30 tablet 1  . temazepam (RESTORIL) 7.5 MG capsule 1- 2 tablets at night as needed for sleep 60 capsule 0   No current facility-administered medications for this visit.   Facility-Administered Medications Ordered in Other Visits  Medication Dose Route Frequency Provider Last Rate Last Dose  . sodium chloride 0.9 % injection 10 mL  10 mL Intravenous PRN [name], MD      . sodium chloride 0.9 % injection 10 mL  10 mL Intravenous PRN [name], MD   10 mL at 03/21/15 0849  . sodium chloride 0.9 % injection 10 mL  10 mL Intracatheter PRN [name], MD   10 mL at 04/25/15 1594    Review of Systems:  GENERAL: Doing "ok". No fevers or sweats. Weight up and down 1-2 pounds. PERFORMANCE STATUS (ECOG): 1 HEENT: No visual changes, runny nose, sore throat, or tenderness. Lungs: No shortness of breath or cough. No hemoptysis. Cardiac: No chest pain, palpitations, orthopnea, or PND. GI: No nausea, vomiting, diarrhea, constipation, melena or hematochezia. GU: No urgency, frequency, dysuria, or hematuria. Musculoskeletal: No back pain. No joint pain. No muscle tenderness. Extremities: No pain or swelling. Skin: Chest wall lesions.  Areas radiated, improving.  New areas expanding outside of radiation field. Neuro: No headache, numbness or weakness, balance or coordination  issues. Endocrine: Diabetes.  No thyroid issues, hot flashes or night sweats. Psych: No mood changes, depression or anxiety.  trouble sleeping. Pain: No focal pain. Review of systems: All other systems reviewed and found to be negative.  Physical Exam: Blood pressure 155/75, pulse 103, temperature 98.2 F (36.8 C), temperature source Tympanic, height _0  (1.549 m), weight 106 lb 7.7 oz (48.3 kg). GENERAL: Thin elderly woman sitting comfortably in the exam room in no acute distress. MENTAL STATUS: Alert and oriented to person, place and time. HEAD: Short silver wig. Normocephalic, atraumatic, face symmetric, no Cushingoid features. EYES: Blue eyes. Pupils equal round and reactive to light and accomodation.  No conjunctivitis or scleral icterus. ENT:  Oropharynx clear without lesion.  Tongue normal. Mucous membranes moist.  RESPIRATORY:  Clear to auscultation without rales, wheezes or rhonchi. CARDIOVASCULAR:  Regular rate and rhythm without murmur, rub or gallop. BREAST: Right mastectomy. Breast lesions dry.  Nodular lesion measuring 6 x 4 cm medially (dry and flat).  Strip of flattening nodularity under axillae 9 x 3 cm.  Medial to lateral skin changes approximately 27 cm x 14 cm up chest wall.  Skin lesions have past midline and are encroaching on the left breast. ABDOMEN:  Soft, non-tender, with active bowel sounds, and no hepatosplenomegaly.  No masses. SKIN: Chest wall lesions as above.  Upper right arm with 4.5 x 3.5 cm palpable irregularity. Right far lateral back (near axillae) with 3.5 x 3 cm palpable nodularity. EXTREMITIES: No edema, no skin discoloration or tenderness. No palpable cords. LYMPH NODES: No palpable axillary adenopathy  NEUROLOGICAL: Unremarkable. PSYCH: Appropriate.   Appointment on 06/19/2015  Component Date Value Ref Range Status  . WBC 06/19/2015 3.7  3.6 - 11.0 K/uL Final  . RBC 06/19/2015 3.52* 3.80 - 5.20 MIL/uL Final  . Hemoglobin  06/19/2015 11.4* 12.0 - 16.0 g/dL Final  . HCT 06/19/2015 34.3* 35.0 - 47.0 % Final  . MCV 06/19/2015 97.5  80.0 - 100.0 fL Final  . MCH 06/19/2015 32.3  26.0 - 34.0 pg Final  . MCHC 06/19/2015 33.1  32.0 - 36.0 g/dL Final  . RDW 06/19/2015 17.0* 11.5 - 14.5 % Final  . Platelets 06/19/2015 127* 150 - 440 K/uL Final  . Neutrophils Relative % 06/19/2015 66   Final  . Neutro Abs 06/19/2015 2.4  1.4 - 6.5 K/uL Final  . Lymphocytes Relative 06/19/2015 24   Final  . Lymphs Abs 06/19/2015 0.9* 1.0 - 3.6 K/uL Final  . Monocytes Relative 06/19/2015 10   Final  . Monocytes Absolute 06/19/2015 0.4  0.2 - 0.9 K/uL Final  . Eosinophils Relative 06/19/2015 1   Final  . Eosinophils Absolute 06/19/2015 0.0  0 - 0.7 K/uL Final  . Basophils Relative 06/19/2015 1   Final  . Basophils Absolute 06/19/2015 0.0  0 - 0.1 K/uL Final  . Sodium 06/19/2015 136  135 - 145 mmol/L Final  . Potassium 06/19/2015 4.2  3.5 - 5.1 mmol/L Final  . Chloride 06/19/2015 103  101 - 111 mmol/L Final  . CO2 06/19/2015 26  22 - 32 mmol/L Final  . Glucose, Bld 06/19/2015 151* 65 - 99 mg/dL Final  . BUN 06/19/2015 13  6 - 20 mg/dL Final  . Creatinine, Ser 06/19/2015 0.48  0.44 - 1.00 mg/dL Final  . Calcium 06/19/2015 9.8  8.9 - 10.3 mg/dL Final  . Total Protein 06/19/2015 7.2  6.5 - 8.1 g/dL Final  . Albumin 06/19/2015 4.3  3.5 - 5.0 g/dL Final  . AST 06/19/2015 19  15 - 41 U/L Final  . ALT 06/19/2015 11* 14 - 54 U/L Final  . Alkaline Phosphatase 06/19/2015 56  38 - 126 U/L Final  . Total Bilirubin 06/19/2015 0.6  0.3 - 1.2 mg/dL Final  . GFR calc non Af Amer 06/19/2015 >60  >60 mL/min Final  . GFR calc Af Amer 06/19/2015 >60  >60 mL/min Final   Comment: (NOTE) The eGFR has been calculated using the CKD EPI equation. This calculation has not been validated in all clinical situations. eGFR's persistently <60 mL/min signify possible Chronic Kidney Disease.   . Anion gap 06/19/2015 7  5 - 15  Final    Assessment:  Dawn Wiley is an 81 y.o. female with stage IIIC right breast cancer status post mastectomy and axillary lymph node dissection on 08/31/2014. 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 Carcinoma involved skeletal muscle and dermal lymphatics. Tumor was less than 0.5 mm from the deep margin. Thirteen of 15 lymph nodes were involved. The largest metastatic focus was 2 cm. Tumor is triple negative (ER negative, PR negative, and HER-2/neu negative).  Tumor is androgen receptor negative.  Pathologic stage was [id number].  PET scan on 09/21/2014 revealed postoperative changes in the right chest with no suspicious findings or residual tumor. Bone scan on 10/11/2014 revealed no evidence of metastatic disease.  Bone density study on 10/02/2014 revealed a T score of -4.7 in the forearm consistent with osteoporosis. T-score was less than 2.5 in the spine or hip. Echocardiogram on 10/02/2014 revealed ejection fraction of 60-65%.  She has iron deficiency anemia. Labs on 09/19/2014 revealed a hematocrit of 29.9, hemoglobin 8.7, MCV 69.7, ferritin 9, and TIBC 616. B12 was low (265) with a prior history of B12 deficiency and need for supplementation (stopped 06/2014). She began B12 on 10/12/2014 (last 04/25/2015).  Folate was normal. Her diet is modest. She has never had a colonoscopy. She denies any melena or hematochezia.  She takes 1 iron pill a day (additional iron causes diarrhea).  Ferritin was 116 on 04/25/2015.  Chest wall biopsy x 3 (lateral, medial, and middle sites) on 11/08/2014 revealed dermal lymphatic involvement by invasive mammary carcinoma with micropapillary features.    She has aggressive disease with growth despite several chemotherapeutic agents.  She received 5 weeks of Taxol (11/03/2014 - 12/01/2014).  She received 1 week of adriamycin (12/14/2014).  She received 1 cycle of carboplatin (12/28/2014).  After carboplatin, disease appeared to  stabilize, but with 2 weeks off of therapy, her disease grew again (medial lesion larger and weaping).   She received 3 cycles of carboplatin and Taxol (01/18/2015 - 03/02/2015).  Chest wall lesions initially decreased then began to grow.  She received 2 cycles of adriamycin and Cytoxan (03/26/2015 - 04/11/2015) with Neulasta support.  ANC nadir was 200 on day 9 (04/03/2015).  Cycle #2 was complicated by mouth sores, decreased oral intake, and weight loss. Chest wall disease began to grow before cycle #3 could be administered.  PET scan on 04/26/2015 revealed extensive recurrence about the right chest.  The far lateral component measured 4.2 x 2.2 cm (S.U.V.13.5).  A portion superficial to the right-side of the sternum measured 5.2 x 2.2 cm (S.U.V. 9.1).  There was diffuse more inferior and lateral chest wall soft tissue thickening and hypermetabolism.  CA27.29 was 118.4 on 05/02/2015.  She completed 5 weeks of concurrent carboplatin and radiation (05/14/2015 - 06/11/2015).  Radiation completed on 06/15/2015.  Cutaneous lesions in the field of radiation have improved dramatically.  There are new areas outside of the field of radiation.  Plan: 1.  Discuss response to radiation and carboplatin, but progression outside of radiation field.  Discuss need for systemic therapy.  Discuss awaiting PDL-1 testing and Foundation One testing. 2.  Contact [facility] laboratories [phone]) regarding PDL-1 testing (done).  Await follow-up call back. 3.  Foundation One testing contacted.  Results should be back in next few days. 4.  Phone follow-up with patient and her daughter once testing back. 5.  B12 today. 6.  Continue current supplimental potassium (10 meq a day). 7.  Discuss pain medication and  sleeping medications. 8.  Anticipate systemic therapy once testing back (clinical trial, compassionate release, other).   [name], MD  06/19/2015, 5:15 PM

## 2015-06-20 ENCOUNTER — Inpatient Hospital Stay: Payer: PPO

## 2015-06-22 ENCOUNTER — Telehealth: Payer: Self-pay | Admitting: *Deleted

## 2015-06-22 ENCOUNTER — Other Ambulatory Visit: Payer: Self-pay | Admitting: *Deleted

## 2015-06-22 DIAGNOSIS — E1110 Type 2 diabetes mellitus with ketoacidosis without coma: Secondary | ICD-10-CM

## 2015-06-22 MED ORDER — SAFETY LANCET 28G/PRESSURE ACT MISC: 1.0000 | Status: DC | PRN

## 2015-06-22 MED ORDER — GLUCOSE BLOOD VI STRP: ORAL_STRIP | Status: DC

## 2015-06-22 NOTE — Progress Notes (Signed)
Patient called for refill on test strips and Lancets . Refill sent as requested.

## 2015-06-22 NOTE — Telephone Encounter (Signed)
CAlled and informed pt that we are still waiting on test results to come back before a decision can be made regarding treatment. She said she will wait until Tuesday afternoon and then call back

## 2015-06-22 NOTE — Telephone Encounter (Signed)
States Dr Mike Gip was to decide on a new treatment for er and she has not heard from [place] regarding this. Please call back today

## 2015-06-26 ENCOUNTER — Telehealth: Payer: Self-pay

## 2015-06-26 ENCOUNTER — Inpatient Hospital Stay (HOSPITAL_BASED_OUTPATIENT_CLINIC_OR_DEPARTMENT_OTHER): Payer: PPO | Admitting: Hematology and Oncology

## 2015-06-26 ENCOUNTER — Other Ambulatory Visit: Payer: Self-pay | Admitting: Hematology and Oncology

## 2015-06-26 VITALS — BP 134/82 | HR 96 | Temp 96.2°F | Resp 18 | Ht 61.0 in | Wt 106.3 lb

## 2015-06-26 DIAGNOSIS — M810 Age-related osteoporosis without current pathological fracture: Secondary | ICD-10-CM | POA: Diagnosis not present

## 2015-06-26 DIAGNOSIS — Z87442 Personal history of urinary calculi: Secondary | ICD-10-CM

## 2015-06-26 DIAGNOSIS — C50911 Malignant neoplasm of unspecified site of right female breast: Secondary | ICD-10-CM

## 2015-06-26 DIAGNOSIS — Z171 Estrogen receptor negative status [ER-]: Secondary | ICD-10-CM

## 2015-06-26 DIAGNOSIS — Z9221 Personal history of antineoplastic chemotherapy: Secondary | ICD-10-CM

## 2015-06-26 DIAGNOSIS — C779 Secondary and unspecified malignant neoplasm of lymph node, unspecified: Secondary | ICD-10-CM

## 2015-06-26 DIAGNOSIS — I2510 Atherosclerotic heart disease of native coronary artery without angina pectoris: Secondary | ICD-10-CM

## 2015-06-26 DIAGNOSIS — I10 Essential (primary) hypertension: Secondary | ICD-10-CM

## 2015-06-26 DIAGNOSIS — E538 Deficiency of other specified B group vitamins: Secondary | ICD-10-CM

## 2015-06-26 DIAGNOSIS — Z7984 Long term (current) use of oral hypoglycemic drugs: Secondary | ICD-10-CM

## 2015-06-26 DIAGNOSIS — Z79899 Other long term (current) drug therapy: Secondary | ICD-10-CM

## 2015-06-26 DIAGNOSIS — Z923 Personal history of irradiation: Secondary | ICD-10-CM

## 2015-06-26 DIAGNOSIS — Z7982 Long term (current) use of aspirin: Secondary | ICD-10-CM

## 2015-06-26 DIAGNOSIS — E119 Type 2 diabetes mellitus without complications: Secondary | ICD-10-CM

## 2015-06-26 DIAGNOSIS — D509 Iron deficiency anemia, unspecified: Secondary | ICD-10-CM

## 2015-06-26 DIAGNOSIS — R21 Rash and other nonspecific skin eruption: Secondary | ICD-10-CM

## 2015-06-26 DIAGNOSIS — E785 Hyperlipidemia, unspecified: Secondary | ICD-10-CM

## 2015-06-26 DIAGNOSIS — G4700 Insomnia, unspecified: Secondary | ICD-10-CM

## 2015-06-26 DIAGNOSIS — M129 Arthropathy, unspecified: Secondary | ICD-10-CM

## 2015-06-26 NOTE — Telephone Encounter (Signed)
Received call from Dr. Mike Gip with concerns of an area on the Right Upper arm that she would like to have biopsied. Patient has been added to the schedule for 06/27/15 at 3:15pm for Punch biopsy of this area. Patient was called and given this information. Confirmed appointment information with patient.

## 2015-06-27 ENCOUNTER — Ambulatory Visit (INDEPENDENT_AMBULATORY_CARE_PROVIDER_SITE_OTHER): Payer: PPO | Admitting: General Surgery

## 2015-06-27 ENCOUNTER — Encounter: Payer: Self-pay | Admitting: Hematology and Oncology

## 2015-06-27 ENCOUNTER — Ambulatory Visit
Admission: RE | Admit: 2015-06-27 | Discharge: 2015-06-27 | Disposition: A | Discharge status: Home / Self Care | Payer: PPO | Source: Ambulatory Visit | Attending: Hematology and Oncology | Admitting: Hematology and Oncology

## 2015-06-27 ENCOUNTER — Encounter: Payer: Self-pay | Admitting: General Surgery

## 2015-06-27 VITALS — BP 145/78 | HR 106 | Temp 97.8°F | Ht 61.0 in | Wt 106.6 lb

## 2015-06-27 DIAGNOSIS — C50911 Malignant neoplasm of unspecified site of right female breast: Secondary | ICD-10-CM

## 2015-06-27 DIAGNOSIS — I340 Nonrheumatic mitral (valve) insufficiency: Secondary | ICD-10-CM | POA: Insufficient documentation

## 2015-06-27 DIAGNOSIS — I071 Rheumatic tricuspid insufficiency: Secondary | ICD-10-CM | POA: Diagnosis not present

## 2015-06-27 NOTE — Progress Notes (Signed)
*  PRELIMINARY RESULTS* Echocardiogram 2D Echocardiogram has been performed.  Laqueta Jean Hege 06/27/2015, 11:07 AM

## 2015-06-27 NOTE — Patient Instructions (Signed)
You may continue to have some bleeding from this area for the next several days. Keep a bandage over this while it's bleeding.  We will call you and let Dr. Mike Gip know as soon as we get the biopsy results back.

## 2015-06-27 NOTE — Progress Notes (Signed)
[facility] day:  06/26/2015  Chief Complaint: Dawn Wiley is an 81 y.o. female with stage IIIC right breast cancer who is seen for review of interval Foundation One testing and discussion regarding direction of therapy.  HPI: The patient was last seen in the medical oncology clinic on 06/19/2015.  At that time, she was seen following completion of concurrent carboplatin and radiation.  She continued to notice areas of breast cancer outside of the radiation field popping up.  She notices an area around her back on the right side, right upper arm, and toward her left breast.  The area that received radiation had done well.  Pain as well controlled.  We discussed the need for systemic therapy.  PDL-1 testing returned negative. Foundation One testing was pending.  Subsequently, Foundation One testing from 05/22/2015 returned genomic findings of ERBB2 (HER2) with FDA-approved therapies of trastuzumab (Herceptin), pertuzumab (Perjeta), lapatinib (Tykerb), ado-trastuzumab emtansine (Kadcyla), as well as afatinib (Gilotrif) approved in another tumor type.  In addition, there was amplification of FGFR1 with FDA-approved therapies (in another tumor type) of pazopanib (Votrient) and ponatinib (Iclusig) on clinical trial.  Symptomatically, she continues to note expansion of breast cancer lesions outside of the radiation field along her chest wall.  The nodular area on her right arm continues to grow.  She feels well, otherwise.  Past Medical History  Diagnosis Date  . Kidney stones   . Hyperlipidemia   . Coronary artery disease     Coronary calcifications noted on CT scan  . Diabetes mellitus without complication ([place])   . Nephrolithiasis   . Hypertension   . Arthritis   . Anemia   . Tachycardia     Dr. Fletcher Anon, cardiologist  . Cancer [facility])     breast (right)  . PONV (postoperative nausea and vomiting)   . Rash     back  . Breast cancer [facility])      Past Surgical History  Procedure Laterality Date  . Temporomandibular joint surgery    . Cataract extraction      bilateral  . Breast surgery      breast biopsy X 3  . Eye surgery      bilateral cataract extraction  . Mastectomy modified radical Right 08/31/2014    Procedure: MASTECTOMY MODIFIED RADICAL;  Surgeon: Molly Maduro, MD;  Location: [facility] ORS;  Service: General;  Laterality: Right;  . Portacath placement Left 10/20/2014    Procedure: INSERTION PORT-A-CATH;  Surgeon: Marlyce Huge, MD;  Location: [facility] ORS;  Service: General;  Laterality: Left;    Family History  Problem Relation Age of Onset  . Peripheral Artery Disease Sister     carotid artery stenosis   . Heart disease Sister   . Diabetes Sister     Social History:  reports that she has never smoked. She has never used smokeless tobacco. She reports that she does not drink alcohol or use illicit drugs.  The patient is accompanied by her daughter today.  Her daughter's phone number is [phone].  Allergies: No Known Allergies  Current Medications: Current Outpatient Prescriptions  Medication Sig Dispense Refill  . acetaminophen (TYLENOL) 500 MG tablet Take 500 mg by mouth every 6 (six) hours as needed for mild pain or moderate pain.     [name] Kitchen amLODipine (NORVASC) 5 MG tablet take 1 tablet by mouth once daily 90 tablet 1  . aspirin 81 MG tablet Take 81 mg by mouth every other day.    [name] Kitchen  Calcium Carbonate (CALTRATE 600 PO) Take 1,200 mg by mouth daily.     . cholecalciferol (VITAMIN D) 400 UNITS TABS tablet Take 800 Units by mouth.    . Cyanocobalamin (VITAMIN B-12 IJ) Inject as directed every 30 (thirty) days.     . diphenhydrAMINE (BENADRYL) 25 mg capsule Take 25 mg by mouth every 6 (six) hours as needed for allergies.    . ferrous sulfate 325 (65 FE) MG tablet Take 325 mg by mouth daily with breakfast.     . glucose blood test strip Test blood sugars every morning and after meals DX E 11.19 100 each 12   . HYDROcodone-acetaminophen (NORCO/VICODIN) 5-325 MG tablet Take 1 tablet by mouth every 6 (six) hours as needed for moderate pain. 30 tablet 0  . Lancets (SAFETY LANCET 28G/PRESSURE ACT) MISC 1 each by Other route as needed (to test glucose levels). 50 each 12  . lidocaine-prilocaine (EMLA) cream Apply 1 application topically as needed. Apply 1-2 hours prior to chemotherapy 30 g 1  . loratadine (CLARITIN) 10 MG tablet Take 1 tablet (10 mg total) by mouth daily. 30 tablet 0  . magic mouthwash SOLN Take 5 mLs by mouth 4 (four) times daily. 200 mL 1  . metFORMIN (GLUCOPHAGE) 500 MG tablet Take 1 tablet (500 mg total) by mouth 2 (two) times daily with a meal. 180 tablet 1  . metoprolol tartrate (LOPRESSOR) 25 MG tablet Take 1 tablet (25 mg total) by mouth 2 (two) times daily. 180 tablet 2  . ondansetron (ZOFRAN) 8 MG tablet take 1 tablet by mouth twice a day if needed .[name] KitchenMarland KitchenSTART ON THE THIRD DAY AFTER CHEMOTHERAPY  0  . oxyCODONE-acetaminophen (PERCOCET/ROXICET) 5-325 MG tablet Take 1 tablet by mouth every 4 (four) hours as needed for severe pain. 30 tablet 0  . potassium chloride (K-DUR,KLOR-CON) 10 MEQ tablet Take 1 tablet (10 mEq total) by mouth daily. 30 tablet 1  . temazepam (RESTORIL) 7.5 MG capsule 1- 2 tablets at night as needed for sleep 60 capsule 0   No current facility-administered medications for this visit.   Facility-Administered Medications Ordered in Other Visits  Medication Dose Route Frequency Provider Last Rate Last Dose  . sodium chloride 0.9 % injection 10 mL  10 mL Intravenous PRN [name], MD      . sodium chloride 0.9 % injection 10 mL  10 mL Intravenous PRN [name], MD   10 mL at 03/21/15 0849  . sodium chloride 0.9 % injection 10 mL  10 mL Intracatheter PRN [name], MD   10 mL at 04/25/15 4193    Review of Systems:  GENERAL: Doing "about the same". No fevers or sweats. Weight stable. PERFORMANCE STATUS (ECOG): 1 HEENT: No visual  changes, runny nose, sore throat, or tenderness. Lungs: No shortness of breath or cough. No hemoptysis. Cardiac: No chest pain, palpitations, orthopnea, or PND. GI: No nausea, vomiting, diarrhea, constipation, melena or hematochezia. GU: No urgency, frequency, dysuria, or hematuria. Musculoskeletal: No back pain. No joint pain. No muscle tenderness. Extremities: No pain or swelling. Skin: Chest wall lesions radiated, stable.  New areas expanding outside of radiation field. Neuro: No headache, numbness or weakness, balance or coordination issues. Endocrine: Diabetes.  No thyroid issues, hot flashes or night sweats. Psych: No mood changes, depression or anxiety.  trouble sleeping. Pain: No focal pain. Review of systems: All other systems reviewed and found to be negative.  Physical Exam: Blood pressure 134/82, pulse 96, temperature 96.2 F (  35.7 C), temperature source Tympanic, resp. rate 18, height 5' 1"  (1.549 m), weight 106 lb 4.2 oz (48.2 kg). GENERAL: Thin elderly woman sitting comfortably in the exam room in no acute distress. MENTAL STATUS: Alert and oriented to person, place and time. HEAD: Short silver wig. Normocephalic, atraumatic, face symmetric, no Cushingoid features. EYES: Blue eyes. Pupils equal round and reactive to light and accomodation.  No conjunctivitis or scleral icterus. ENT:  Oropharynx clear without lesion.  Tongue normal. Mucous membranes moist.  RESPIRATORY:  Clear to auscultation without rales, wheezes or rhonchi. CARDIOVASCULAR:  Regular rate and rhythm without murmur, rub or gallop. BREAST: Right mastectomy. Breast lesions dry.  Nodular lesion measuring 5 x 3 cm medially (dry and flat).  Strip of flattening nodularity under axillae 9 x 2.5 cm.  Medial to lateral skin changes approximately 31 cm x 15 cm up chest wall.  Skin lesions have past midline and are encroaching on the left breast. ABDOMEN:  Soft, non-tender, with active bowel sounds,  and no hepatosplenomegaly.  No masses. SKIN: Chest wall lesions as above.  Upper right arm with 4.5 x 3.5 cm palpable irregularity. Right far lateral back (near axillae) with 3.5 x 3 cm palpable nodularity with an additional 1.5 x 1.5 cm nodular area. EXTREMITIES: No edema, no skin discoloration or tenderness. No palpable cords. LYMPH NODES: No palpable axillary adenopathy  NEUROLOGICAL: Unremarkable. PSYCH: Appropriate.   No visits with results within 3 Day(s) from this visit. Latest known visit with results is:  Appointment on 06/19/2015  Component Date Value Ref Range Status  . WBC 06/19/2015 3.7  3.6 - 11.0 K/uL Final  . RBC 06/19/2015 3.52* 3.80 - 5.20 MIL/uL Final  . Hemoglobin 06/19/2015 11.4* 12.0 - 16.0 g/dL Final  . HCT 06/19/2015 34.3* 35.0 - 47.0 % Final  . MCV 06/19/2015 97.5  80.0 - 100.0 fL Final  . MCH 06/19/2015 32.3  26.0 - 34.0 pg Final  . MCHC 06/19/2015 33.1  32.0 - 36.0 g/dL Final  . RDW 06/19/2015 17.0* 11.5 - 14.5 % Final  . Platelets 06/19/2015 127* 150 - 440 K/uL Final  . Neutrophils Relative % 06/19/2015 66   Final  . Neutro Abs 06/19/2015 2.4  1.4 - 6.5 K/uL Final  . Lymphocytes Relative 06/19/2015 24   Final  . Lymphs Abs 06/19/2015 0.9* 1.0 - 3.6 K/uL Final  . Monocytes Relative 06/19/2015 10   Final  . Monocytes Absolute 06/19/2015 0.4  0.2 - 0.9 K/uL Final  . Eosinophils Relative 06/19/2015 1   Final  . Eosinophils Absolute 06/19/2015 0.0  0 - 0.7 K/uL Final  . Basophils Relative 06/19/2015 1   Final  . Basophils Absolute 06/19/2015 0.0  0 - 0.1 K/uL Final  . Sodium 06/19/2015 136  135 - 145 mmol/L Final  . Potassium 06/19/2015 4.2  3.5 - 5.1 mmol/L Final  . Chloride 06/19/2015 103  101 - 111 mmol/L Final  . CO2 06/19/2015 26  22 - 32 mmol/L Final  . Glucose, Bld 06/19/2015 151* 65 - 99 mg/dL Final  . BUN 06/19/2015 13  6 - 20 mg/dL Final  . Creatinine, Ser 06/19/2015 0.48  0.44 - 1.00 mg/dL Final  . Calcium 06/19/2015 9.8  8.9 - 10.3 mg/dL  Final  . Total Protein 06/19/2015 7.2  6.5 - 8.1 g/dL Final  . Albumin 06/19/2015 4.3  3.5 - 5.0 g/dL Final  . AST 06/19/2015 19  15 - 41 U/L Final  . ALT 06/19/2015 11* 14 - 54  U/L Final  . Alkaline Phosphatase 06/19/2015 56  38 - 126 U/L Final  . Total Bilirubin 06/19/2015 0.6  0.3 - 1.2 mg/dL Final  . GFR calc non Af Amer 06/19/2015 >60  >60 mL/min Final  . GFR calc Af Amer 06/19/2015 >60  >60 mL/min Final   Comment: (NOTE) The eGFR has been calculated using the CKD EPI equation. This calculation has not been validated in all clinical situations. eGFR's persistently <60 mL/min signify possible Chronic Kidney Disease.   . Anion gap 06/19/2015 7  5 - 15 Final    Assessment:  Dawn Wiley is an 81 y.o. female with stage IIIC right breast cancer status post mastectomy and axillary lymph node dissection on 08/31/2014. 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 Carcinoma involved skeletal muscle and dermal lymphatics. Tumor was less than 0.5 mm from the deep margin. Thirteen of 15 lymph nodes were involved. The largest metastatic focus was 2 cm. Tumor is triple negative (ER negative, PR negative, and HER-2/neu negative).  Androgen receptor and PDL-1 testing was negative.  Pathologic stage was [id number].  PET scan on 09/21/2014 revealed postoperative changes in the right chest with no suspicious findings or residual tumor. Bone scan on 10/11/2014 revealed no evidence of metastatic disease.  Bone density study on 10/02/2014 revealed a T score of -4.7 in the forearm consistent with osteoporosis. T-score was less than 2.5 in the spine or hip. Echocardiogram on 10/02/2014 revealed ejection fraction of 60-65%.  She has iron deficiency anemia. Labs on 09/19/2014 revealed a hematocrit of 29.9, hemoglobin 8.7, MCV 69.7, ferritin 9, and TIBC 616. B12 was low (265) with a prior history of B12 deficiency and need for supplementation (stopped  06/2014). She began B12 on 10/12/2014 (last 04/25/2015).  Folate was normal. Her diet is modest. She has never had a colonoscopy. She denies any melena or hematochezia.  She takes 1 iron pill a day (additional iron causes diarrhea).  Ferritin was 116 on 04/25/2015.  Chest wall biopsy x 3 (lateral, medial, and middle sites) on 11/08/2014 revealed dermal lymphatic involvement by invasive mammary carcinoma with micropapillary features.    She has aggressive disease with growth despite several chemotherapeutic agents.  She received 5 weeks of Taxol (11/03/2014 - 12/01/2014).  She received 1 week of adriamycin (12/14/2014).  She received 1 cycle of carboplatin (12/28/2014).  After carboplatin, disease appeared to stabilize, but with 2 weeks off of therapy, her disease grew again (medial lesion larger and weaping).   She received 3 cycles of carboplatin and Taxol (01/18/2015 - 03/02/2015).  Chest wall lesions initially decreased then began to grow.  She received 2 cycles of adriamycin and Cytoxan (03/26/2015 - 04/11/2015) with Neulasta support.  ANC nadir was 200 on day 9 (04/03/2015).  Cycle #2 was complicated by mouth sores, decreased oral intake, and weight loss. Chest wall disease began to grow before cycle #3 could be administered.  PET scan on 04/26/2015 revealed extensive recurrence about the right chest.  The far lateral component measured 4.2 x 2.2 cm (S.U.V.13.5).  A portion superficial to the right-side of the sternum measured 5.2 x 2.2 cm (S.U.V. 9.1).  There was diffuse more inferior and lateral chest wall soft tissue thickening and hypermetabolism.  CA27.29 was 118.4 on 05/02/2015.  She received 5 weeks of concurrent carboplatin and radiation (05/14/2015 - 06/11/2015).  Radiation completed on 06/15/2015.  Cutaneous lesions in the field of radiation have improved dramatically.  There is ongoing extension of disease outside  of the field of radiation.  Foundation One testing on 05/22/2015  returned genomic findings of ERBB2 (HER2) and amplification of FGFR1.  Plan: 1.  Discuss Foundation One test results.  Discuss ERBB2 (HER2/neu) results with FDA approved therapies of trastuzumab (Herceptin), pertuzumab (Perjeta), lapatinib (Tykerb), ado-trastuzumab emtansine (Kadcyla).  Discuss follow-up with Foundation One regarding discrepancy of original IHC 1+ for Her2/neu and current testing.  Confirmed that patient should respond to Her2/neu directed therapy based on their data and published reports in the literature in this situation.  2.  Discuss side effects associated with Her2/neu therapy.  Discuss follow-up echo and every 3 months.  Discuss starting with Herecptin weekly and assessing  tolerance and response.  Discuss consideration of adding chemotherapy or additional Her2/neu agent, Perjeta depending on response.  Discuss biopsy of right upper arm lesion to document that this represents additional breast cancer focus.  Patient has clear progression of disease on chest wall.  Plan to follow cutaneous disease as well as periodic systemic imaging.  3.  Schedule echocardiogram:  Assess EF. 4.  Biopsy of right upper arm lesion with Monico Blitz tomorrow at 3:15 pm. 5.  Preauth Herceptin. 6.  RTC for MD assessment, labs (CBC with diff, CMP, Mg, CA27.29), and initiation of Herceptin.   [name], MD  06/26/2015

## 2015-06-28 DIAGNOSIS — IMO0001 Reserved for inherently not codable concepts without codable children: Secondary | ICD-10-CM | POA: Insufficient documentation

## 2015-06-28 NOTE — Progress Notes (Signed)
Outpatient Surgical Follow Up  06/28/2015  Dawn Wiley is an 81 y.o. female.   Chief Complaint  Patient presents with  . Biopsy    Right Upper Arm Lesion    HPI: 81 year old female who is well-known to the surgery department returns to clinic for evaluation of new skin changes in her right arm adjacent to her right breast that is known to have inflammatory breast cancer. She has aggressive breast cancer that has been spreading despite chemotherapy and radiation. She was sent here today for evaluation from oncology to assess whether or not this new lesion on her arm is actually continued spread of her breast cancer. Patient states she is otherwise been doing well and denies any pain, nausea, vomiting, diarrhea, constipation. The area does not itch or cause her any discomfort but it does appear to be growing.  Past Medical History  Diagnosis Date  . Kidney stones   . Hyperlipidemia   . Coronary artery disease     Coronary calcifications noted on CT scan  . Diabetes mellitus without complication ([place])   . Nephrolithiasis   . Hypertension   . Arthritis   . Anemia   . Tachycardia     Dr. Fletcher Anon, cardiologist  . Cancer [facility])     breast (right)  . PONV (postoperative nausea and vomiting)   . Rash     back  . Breast cancer [facility])     Past Surgical History  Procedure Laterality Date  . Temporomandibular joint surgery    . Cataract extraction      bilateral  . Breast surgery      breast biopsy X 3  . Eye surgery      bilateral cataract extraction  . Mastectomy modified radical Right 08/31/2014    Procedure: MASTECTOMY MODIFIED RADICAL;  Surgeon: Molly Maduro, MD;  Location: [facility] ORS;  Service: General;  Laterality: Right;  . Portacath placement Left 10/20/2014    Procedure: INSERTION PORT-A-CATH;  Surgeon: Marlyce Huge, MD;  Location: [facility] ORS;  Service: General;  Laterality: Left;    Family History  Problem Relation Age of Onset  . Peripheral Artery Disease  Sister     carotid artery stenosis   . Heart disease Sister   . Diabetes Sister     Social History:  reports that she has never smoked. She has never used smokeless tobacco. She reports that she does not drink alcohol or use illicit drugs.  Allergies: No Known Allergies  Medications reviewed.    ROS  Multipoint review of systems was completed, all pertinent positives and negatives were documented within the history of present illness the remainder negative.  BP 145/78 mmHg  Pulse 106  Temp(Src) 97.8 F (36.6 C)  Ht 5\' 1"  (1.549 m)  Wt 48.353 kg (106 lb 9.6 oz)  BMI 20.15 kg/m2  Physical Exam Gen.: No acute distress Neck: Supple and nontender Chest: Clear to auscultation Breast: Right breast surgically absent with radiation changes to the skin over the entire mastectomy field. There are palpable areas of concern for cancer recurrence on the medial and lateral aspects of the incision site. She states this is assumed to be known recurrence of breast cancer. Heart: Tachycardic Abdomen: Soft and nontender Skin: There is an area of erythematous skin changes that does appear to be similar in appearance to what is on her breast on her right upper arm. There is a area of normal-appearing skin between this and the known breast cancer.    No results found  for this or any previous visit (from the past 48 hour(s)). No results found.  Assessment/Plan:  1. Breast cancer, stage 3, right [facility]) Patient with known breast cancer and new skin changes in her right upper arm. She's here for a punch biopsy the area.  After obtaining consent for a punch biopsy arm. The areas cleaned off with a alcohol solution localized with 1% lidocaine with epinephrine solution. Area of concern on the most superior aspect was then punch biopsy of a 4 mm punch biopsy. Once this was removed was cut free from its deeper tissues and placed in formalin for permanent section. The area had compression held until his  hemostatic and then was sealed with benzoin and a single Steri-Strip. A gauze bandage was placed over this. Patient tolerated the procedure well.  Discussed with the patient that we would inform her and her oncologist as soon as we have results from pathology that this would likely be next week. Patient voiced understanding and will follow-up in clinic in 1-2 weeks.     Clayburn Pert, MD FACS General Surgeon  06/28/2015,10:02 AM

## 2015-06-29 ENCOUNTER — Inpatient Hospital Stay: Payer: PPO | Attending: Hematology and Oncology

## 2015-06-29 DIAGNOSIS — Z7982 Long term (current) use of aspirin: Secondary | ICD-10-CM | POA: Insufficient documentation

## 2015-06-29 DIAGNOSIS — M818 Other osteoporosis without current pathological fracture: Secondary | ICD-10-CM | POA: Insufficient documentation

## 2015-06-29 DIAGNOSIS — Z171 Estrogen receptor negative status [ER-]: Secondary | ICD-10-CM | POA: Insufficient documentation

## 2015-06-29 DIAGNOSIS — Z79899 Other long term (current) drug therapy: Secondary | ICD-10-CM | POA: Insufficient documentation

## 2015-06-29 DIAGNOSIS — M129 Arthropathy, unspecified: Secondary | ICD-10-CM | POA: Insufficient documentation

## 2015-06-29 DIAGNOSIS — E538 Deficiency of other specified B group vitamins: Secondary | ICD-10-CM | POA: Insufficient documentation

## 2015-06-29 DIAGNOSIS — C792 Secondary malignant neoplasm of skin: Secondary | ICD-10-CM | POA: Insufficient documentation

## 2015-06-29 DIAGNOSIS — I2510 Atherosclerotic heart disease of native coronary artery without angina pectoris: Secondary | ICD-10-CM | POA: Insufficient documentation

## 2015-06-29 DIAGNOSIS — Z923 Personal history of irradiation: Secondary | ICD-10-CM | POA: Insufficient documentation

## 2015-06-29 DIAGNOSIS — Z87442 Personal history of urinary calculi: Secondary | ICD-10-CM | POA: Insufficient documentation

## 2015-06-29 DIAGNOSIS — Z7984 Long term (current) use of oral hypoglycemic drugs: Secondary | ICD-10-CM | POA: Insufficient documentation

## 2015-06-29 DIAGNOSIS — Z5112 Encounter for antineoplastic immunotherapy: Secondary | ICD-10-CM | POA: Insufficient documentation

## 2015-06-29 DIAGNOSIS — I10 Essential (primary) hypertension: Secondary | ICD-10-CM | POA: Insufficient documentation

## 2015-06-29 DIAGNOSIS — E785 Hyperlipidemia, unspecified: Secondary | ICD-10-CM | POA: Insufficient documentation

## 2015-06-29 DIAGNOSIS — R21 Rash and other nonspecific skin eruption: Secondary | ICD-10-CM | POA: Insufficient documentation

## 2015-06-29 DIAGNOSIS — C50911 Malignant neoplasm of unspecified site of right female breast: Secondary | ICD-10-CM | POA: Insufficient documentation

## 2015-06-29 DIAGNOSIS — D509 Iron deficiency anemia, unspecified: Secondary | ICD-10-CM | POA: Insufficient documentation

## 2015-06-29 DIAGNOSIS — E119 Type 2 diabetes mellitus without complications: Secondary | ICD-10-CM | POA: Insufficient documentation

## 2015-07-02 ENCOUNTER — Ambulatory Visit
Admission: RE | Admit: 2015-07-02 | Discharge: 2015-07-02 | Disposition: A | Discharge status: Home / Self Care | Payer: PPO | Source: Ambulatory Visit | Attending: Radiation Oncology | Admitting: Radiation Oncology

## 2015-07-02 ENCOUNTER — Other Ambulatory Visit: Payer: Self-pay | Admitting: *Deleted

## 2015-07-02 ENCOUNTER — Encounter: Payer: Self-pay | Admitting: Radiation Oncology

## 2015-07-02 ENCOUNTER — Telehealth: Payer: Self-pay

## 2015-07-02 VITALS — BP 139/90 | HR 99 | Temp 94.9°F | Resp 18 | Wt 106.6 lb

## 2015-07-02 DIAGNOSIS — C50911 Malignant neoplasm of unspecified site of right female breast: Secondary | ICD-10-CM

## 2015-07-02 NOTE — Progress Notes (Signed)
Radiation Oncology Follow up Note  Name: Dawn Wiley   Date:   07/02/2015 MRN:  [id number] DOB: Dec 02, 1934    This 81 y.o. female presents to the clinic today for follow-up for extensive right chest wall involvement of stage IIIc breast cancer status post initial course of radiation therapy.  REFERRING PROVIDER: Crecencio Mc, MD  HPI: Patient is a 81 year old female status post modified radical mastectomy for tumor invading skeletal muscle and dermal lymphatic invasion with 13 of 15 lymph nodes positive for metastatic disease. She developed marked progression in her chest wall with ulcerative lesions without significant evidence of axillary lymph node involvement. She underwent 5000 cGy external beam treatment with excellent results. She seen today 2 weeks out is doing fairly well she has developed further progression of disease on her back lateral fold of her right chest and crossing over to the left mammary tissue. No areas of active ulceration are noted. She is seeing medical oncology tomorrow for discussion on further chemotherapy possibly resuming Herceptin.  COMPLICATIONS OF TREATMENT: none  FOLLOW UP COMPLIANCE: keeps appointments   PHYSICAL EXAM:  BP 139/90 mmHg  Pulse 99  Temp(Src) 94.9 F (34.9 C)  Resp 18  Wt 106 lb 9.5 oz (48.35 kg) Thin well-developed female in NAD. She's had a most complete regression of her massive involvement of the right chest wall she does have small nodular areas present on her right back right lateral chest wall and crossing midline over into the left anterior chest. No areas of active ulceration are noted. Well-developed well-nourished patient in NAD. HEENT reveals PERLA, EOMI, discs not visualized.  Oral cavity is clear. No oral mucosal lesions are identified. Neck is clear without evidence of cervical or supraclavicular adenopathy. Lungs are clear to A&P. Cardiac examination is essentially unremarkable with regular rate and rhythm without murmur  rub or thrill. Abdomen is benign with no organomegaly or masses noted. Motor sensory and DTR levels are equal and symmetric in the upper and lower extremities. Cranial nerves II through XII are grossly intact. Proprioception is intact. No peripheral adenopathy or edema is identified. No motor or sensory levels are noted. Crude visual fields are within normal range.  RADIOLOGY RESULTS: No current films for review  PLAN: At this time like to discuss the case with medical oncology. Instead of chasing all these skin nodules in the future would like her to have a coarse trial of chemotherapy possibly Herceptin and monitor for response. I was touchup any areas that become problematic with electron beam therapy. All this was discussed with the patient. She will be seeing medical oncology tomorrow. I be happy to reevaluate her any time should further palliative treatment be indicated.  I would like to take this opportunity for allowing me to participate in the care of your patient.Armstead Peaks., MD

## 2015-07-02 NOTE — Telephone Encounter (Signed)
Patient returned your call for results. Please call again when able.

## 2015-07-02 NOTE — Telephone Encounter (Signed)
Called patient at this time to give the results of her Right Upper Arm Punch Biopsy. No answer. Left voicemail for return phone call.  These results have been placed in chart under media but are positive for Invasive Adenocarcinoma similar to the patient's known breast diagnosis. This information was immediately given to [name] in the [place]. She will speak with Dr. Mike Gip about these results and will consult [place] further if needed.

## 2015-07-03 ENCOUNTER — Inpatient Hospital Stay: Payer: PPO

## 2015-07-03 ENCOUNTER — Other Ambulatory Visit: Payer: Self-pay | Admitting: Hematology and Oncology

## 2015-07-03 ENCOUNTER — Inpatient Hospital Stay (HOSPITAL_BASED_OUTPATIENT_CLINIC_OR_DEPARTMENT_OTHER): Payer: PPO | Admitting: Hematology and Oncology

## 2015-07-03 VITALS — BP 127/79 | HR 87 | Temp 97.6°F | Wt 106.9 lb

## 2015-07-03 DIAGNOSIS — Z87442 Personal history of urinary calculi: Secondary | ICD-10-CM | POA: Diagnosis not present

## 2015-07-03 DIAGNOSIS — R21 Rash and other nonspecific skin eruption: Secondary | ICD-10-CM

## 2015-07-03 DIAGNOSIS — E538 Deficiency of other specified B group vitamins: Secondary | ICD-10-CM | POA: Diagnosis not present

## 2015-07-03 DIAGNOSIS — Z7984 Long term (current) use of oral hypoglycemic drugs: Secondary | ICD-10-CM | POA: Diagnosis not present

## 2015-07-03 DIAGNOSIS — I2510 Atherosclerotic heart disease of native coronary artery without angina pectoris: Secondary | ICD-10-CM | POA: Diagnosis not present

## 2015-07-03 DIAGNOSIS — Z7982 Long term (current) use of aspirin: Secondary | ICD-10-CM

## 2015-07-03 DIAGNOSIS — E119 Type 2 diabetes mellitus without complications: Secondary | ICD-10-CM

## 2015-07-03 DIAGNOSIS — Z79899 Other long term (current) drug therapy: Secondary | ICD-10-CM | POA: Diagnosis not present

## 2015-07-03 DIAGNOSIS — D509 Iron deficiency anemia, unspecified: Secondary | ICD-10-CM | POA: Diagnosis not present

## 2015-07-03 DIAGNOSIS — I10 Essential (primary) hypertension: Secondary | ICD-10-CM | POA: Diagnosis not present

## 2015-07-03 DIAGNOSIS — M818 Other osteoporosis without current pathological fracture: Secondary | ICD-10-CM

## 2015-07-03 DIAGNOSIS — Z923 Personal history of irradiation: Secondary | ICD-10-CM | POA: Diagnosis not present

## 2015-07-03 DIAGNOSIS — C792 Secondary malignant neoplasm of skin: Secondary | ICD-10-CM

## 2015-07-03 DIAGNOSIS — Z171 Estrogen receptor negative status [ER-]: Secondary | ICD-10-CM

## 2015-07-03 DIAGNOSIS — C50911 Malignant neoplasm of unspecified site of right female breast: Secondary | ICD-10-CM

## 2015-07-03 DIAGNOSIS — M129 Arthropathy, unspecified: Secondary | ICD-10-CM

## 2015-07-03 DIAGNOSIS — E785 Hyperlipidemia, unspecified: Secondary | ICD-10-CM | POA: Diagnosis not present

## 2015-07-03 DIAGNOSIS — Z5112 Encounter for antineoplastic immunotherapy: Secondary | ICD-10-CM | POA: Diagnosis present

## 2015-07-03 LAB — COMPREHENSIVE METABOLIC PANEL
ALT: 12 U/L — ABNORMAL LOW (ref 14–54)
AST: 18 U/L (ref 15–41)
Albumin: 4.1 g/dL (ref 3.5–5.0)
Alkaline Phosphatase: 68 U/L (ref 38–126)
Anion gap: 7 (ref 5–15)
BUN: 16 mg/dL (ref 6–20)
CO2: 25 mmol/L (ref 22–32)
Calcium: 9.6 mg/dL (ref 8.9–10.3)
Chloride: 104 mmol/L (ref 101–111)
Creatinine, Ser: 0.46 mg/dL (ref 0.44–1.00)
GFR calc Af Amer: >60 mL/min (ref >60)
GFR calc non Af Amer: >60 mL/min (ref >60)
Glucose, Bld: 149 mg/dL — ABNORMAL HIGH (ref 65–99)
Potassium: 4 mmol/L (ref 3.5–5.1)
Sodium: 136 mmol/L (ref 135–145)
Total Bilirubin: 0.4 mg/dL (ref 0.3–1.2)
Total Protein: 7.2 g/dL (ref 6.5–8.1)

## 2015-07-03 LAB — CBC WITH DIFFERENTIAL/PLATELET
Basophils Absolute: 0 10*3/uL (ref 0–0.1)
Basophils Relative: 0 %
Eosinophils Absolute: 0 10*3/uL (ref 0–0.7)
Eosinophils Relative: 1 %
HCT: 33 % — ABNORMAL LOW (ref 35.0–47.0)
Hemoglobin: 11.1 g/dL — ABNORMAL LOW (ref 12.0–16.0)
Lymphocytes Relative: 37 %
Lymphs Abs: 1.9 10*3/uL (ref 1.0–3.6)
MCH: 32.6 pg (ref 26.0–34.0)
MCHC: 33.6 g/dL (ref 32.0–36.0)
MCV: 96.9 fL (ref 80.0–100.0)
Monocytes Absolute: 0.7 10*3/uL (ref 0.2–0.9)
Monocytes Relative: 13 %
Neutro Abs: 2.5 10*3/uL (ref 1.4–6.5)
Neutrophils Relative %: 49 %
Platelets: 167 10*3/uL (ref 150–440)
RBC: 3.41 MIL/uL — ABNORMAL LOW (ref 3.80–5.20)
RDW: 16.3 % — ABNORMAL HIGH (ref 11.5–14.5)
WBC: 5.1 10*3/uL (ref 3.6–11.0)

## 2015-07-03 LAB — MAGNESIUM: Magnesium: 1.9 mg/dL (ref 1.7–2.4)

## 2015-07-03 MED ORDER — HEPARIN SOD (PORK) LOCK FLUSH 100 UNIT/ML IV SOLN
500.0000 [IU] | Freq: Once | INTRAVENOUS | Status: AC | PRN
  Administered 2015-07-03: 500 [IU]
  Filled 2015-07-03: qty 5

## 2015-07-03 MED ORDER — TRASTUZUMAB CHEMO INJECTION 440 MG
4.0000 mg/kg | Freq: Once | INTRAVENOUS | Status: AC
  Administered 2015-07-03: 189 mg via INTRAVENOUS
  Filled 2015-07-03: qty 9

## 2015-07-03 MED ORDER — DIPHENHYDRAMINE HCL 25 MG PO CAPS
50.0000 mg | ORAL_CAPSULE | Freq: Once | ORAL | Status: AC
  Administered 2015-07-03: 50 mg via ORAL
  Filled 2015-07-03: qty 2

## 2015-07-03 MED ORDER — HEPARIN SOD (PORK) LOCK FLUSH 100 UNIT/ML IV SOLN
500.0000 [IU] | Freq: Once | INTRAVENOUS | Status: AC
  Administered 2015-07-03: 500 [IU] via INTRAVENOUS

## 2015-07-03 MED ORDER — ACETAMINOPHEN 325 MG PO TABS
650.0000 mg | ORAL_TABLET | Freq: Once | ORAL | Status: AC
  Administered 2015-07-03: 650 mg via ORAL
  Filled 2015-07-03: qty 2

## 2015-07-03 MED ORDER — SODIUM CHLORIDE 0.9 % IV SOLN
Freq: Once | INTRAVENOUS | Status: AC
  Administered 2015-07-03: 15:00:00 via INTRAVENOUS
  Filled 2015-07-03: qty 1000

## 2015-07-03 MED ORDER — HYDROCODONE-ACETAMINOPHEN 5-325 MG PO TABS: 1.0000 | ORAL_TABLET | Freq: Four times a day (QID) | ORAL | Status: DC | PRN

## 2015-07-03 MED ORDER — SODIUM CHLORIDE 0.9% FLUSH
10.0000 mL | Freq: Once | INTRAVENOUS | Status: AC
  Administered 2015-07-03: 10 mL via INTRAVENOUS
  Filled 2015-07-03: qty 10

## 2015-07-03 NOTE — Telephone Encounter (Signed)
Returned phone call to patient. Explained the results of the biopsy and that Dr. Kem Parkinson nurse navigator had been informed and will speak with Dr. Mike Gip about this so that this can be discussed in today's appointment.

## 2015-07-03 NOTE — Progress Notes (Signed)
Dawn Wiley is an 81 y.o. female with stage IIIC right breast cancer who is seen for assessment prior to initiation of weekly Herceptin.  HPI: The patient was last seen in the medical oncology clinic on 06/26/2015.  At that time, she was seen following completion of concurrent carboplatin and radiation.  She continued to notice areas of breast cancer outside of the radiation field popping up.  She noticed an area around her back on the right side, right upper arm, and toward her left breast.  The area that received radiation had done well.  Pain was well controlled.  We discussed the need for systemic therapy.  PDL-1 testing returned negative.  Foundation One testing from 05/22/2015 returned genomic findings of ERBB2 (HER2) with FDA-approved therapies of trastuzumab (Herceptin), pertuzumab (Perjeta), lapatinib (Tykerb), ado-trastuzumab emtansine (Kadcyla), as well as afatinib (Gilotrif) approved in another tumor type.  In addition, there was amplification of FGFR1 with FDA-approved therapies (in another tumor type) of pazopanib (Votrient) and ponatinib (Iclusig) on clinical trial.  Echocardiogram on 06/27/2015 revealed an EF of 55-65%.  Biopsy of the right upper arm skin changes on 06/28/2015 by Dr. Clayburn Pert confirmed breast cancer.  Symptomatically, she denies any new complaints.  She notes ongoing progression of disease outside of the radiation field.  Past Medical History  Diagnosis Date  . Kidney stones   . Hyperlipidemia   . Coronary artery disease     Coronary calcifications noted on CT scan  . Diabetes mellitus without complication ([place])   . Nephrolithiasis   . Hypertension   . Arthritis   . Anemia   . Tachycardia     Dr. Fletcher Anon, cardiologist  . Cancer [facility])     breast (right)  . PONV (postoperative nausea and vomiting)   . Rash     back  . Breast cancer [facility])     Past  Surgical History  Procedure Laterality Date  . Temporomandibular joint surgery    . Cataract extraction      bilateral  . Breast surgery      breast biopsy X 3  . Eye surgery      bilateral cataract extraction  . Mastectomy modified radical Right 08/31/2014    Procedure: MASTECTOMY MODIFIED RADICAL;  Surgeon: Molly Maduro, MD;  Location: [facility] ORS;  Service: General;  Laterality: Right;  . Portacath placement Left 10/20/2014    Procedure: INSERTION PORT-A-CATH;  Surgeon: Marlyce Huge, MD;  Location: [facility] ORS;  Service: General;  Laterality: Left;    Family History  Problem Relation Age of Onset  . Peripheral Artery Disease Sister     carotid artery stenosis   . Heart disease Sister   . Diabetes Sister     Social History:  reports that she has never smoked. She has never used smokeless tobacco. She reports that she does not drink alcohol or use illicit drugs.  The patient is accompanied by her daughter today.  Her daughter's phone number is [phone].  Allergies: No Known Allergies  Current Medications: Current Outpatient Prescriptions  Medication Sig Dispense Refill  . acetaminophen (TYLENOL) 500 MG tablet Take 500 mg by mouth every 6 (six) hours as needed for mild pain or moderate pain.     [name] Kitchen amLODipine (NORVASC) 5 MG tablet take 1 tablet by mouth once daily 90 tablet 1  . aspirin 81 MG tablet Take 81 mg by mouth every other day.    [name] Kitchen  Calcium Carbonate (CALTRATE 600 PO) Take 1,200 mg by mouth daily.     . cholecalciferol (VITAMIN D) 400 UNITS TABS tablet Take 800 Units by mouth.    . Cyanocobalamin (VITAMIN B-12 IJ) Inject as directed every 30 (thirty) days.     . diphenhydrAMINE (BENADRYL) 25 mg capsule Take 25 mg by mouth every 6 (six) hours as needed for allergies.    . ferrous sulfate 325 (65 FE) MG tablet Take 325 mg by mouth daily with breakfast.     . glucose blood test strip Test blood sugars every morning and after meals DX E 11.19 100 each 12  .  HYDROcodone-acetaminophen (NORCO/VICODIN) 5-325 MG tablet Take 1 tablet by mouth every 6 (six) hours as needed for moderate pain. 30 tablet 0  . Lancets (SAFETY LANCET 28G/PRESSURE ACT) MISC 1 each by Other route as needed (to test glucose levels). 50 each 12  . lidocaine-prilocaine (EMLA) cream Apply 1 application topically as needed. Apply 1-2 hours prior to chemotherapy 30 g 1  . loratadine (CLARITIN) 10 MG tablet Take 1 tablet (10 mg total) by mouth daily. 30 tablet 0  . magic mouthwash SOLN Take 5 mLs by mouth 4 (four) times daily. 200 mL 1  . metFORMIN (GLUCOPHAGE) 500 MG tablet Take 1 tablet (500 mg total) by mouth 2 (two) times daily with a meal. 180 tablet 1  . metoprolol tartrate (LOPRESSOR) 25 MG tablet Take 1 tablet (25 mg total) by mouth 2 (two) times daily. 180 tablet 2  . potassium chloride (K-DUR,KLOR-CON) 10 MEQ tablet Take 1 tablet (10 mEq total) by mouth daily. 30 tablet 1  . temazepam (RESTORIL) 7.5 MG capsule 1- 2 tablets at night as needed for sleep 60 capsule 0   No current facility-administered medications for this visit.   Facility-Administered Medications Ordered in Other Visits  Medication Dose Route Frequency Provider Last Rate Last Dose  . sodium chloride 0.9 % injection 10 mL  10 mL Intravenous PRN [name], MD      . sodium chloride 0.9 % injection 10 mL  10 mL Intravenous PRN [name], MD   10 mL at 03/21/15 0849  . sodium chloride 0.9 % injection 10 mL  10 mL Intracatheter PRN [name], MD   10 mL at 04/25/15 2993    Review of Systems:  GENERAL: Feels "about the same". No fevers or sweats. Weight stable. PERFORMANCE STATUS (ECOG): 1 HEENT: No visual changes, runny nose, sore throat, or tenderness. Lungs: No shortness of breath or cough. No hemoptysis. Cardiac: No chest pain, palpitations, orthopnea, or PND. GI: No nausea, vomiting, diarrhea, constipation, melena or hematochezia. GU: No urgency, frequency, dysuria, or  hematuria. Musculoskeletal: No back pain. No joint pain. No muscle tenderness. Extremities: No pain or swelling. Skin: Chest wall lesions radiated, improving.  New areas expanding outside of radiation field. Neuro: No headache, numbness or weakness, balance or coordination issues. Endocrine: Diabetes.  No thyroid issues, hot flashes or night sweats. Psych: No mood changes, depression or anxiety.  trouble sleeping. Pain: No focal pain. Review of systems: All other systems reviewed and found to be negative.  Physical Exam: Blood pressure 127/79, pulse 87, temperature 97.6 F (36.4 C), temperature source Tympanic, weight 106 lb 14.8 oz (48.5 kg). GENERAL: Thin elderly woman sitting comfortably in the exam room in no acute distress. MENTAL STATUS: Alert and oriented to person, place and time. HEAD: Short silver wig. Normocephalic, atraumatic, face symmetric, no Cushingoid features. EYES: Blue eyes.  Pupils equal round and reactive to light and accomodation.  No conjunctivitis or scleral icterus. ENT:  Oropharynx clear without lesion.  Tongue normal. Mucous membranes moist.  RESPIRATORY:  Clear to auscultation without rales, wheezes or rhonchi. CARDIOVASCULAR:  Regular rate and rhythm without murmur, rub or gallop. BREAST: Right mastectomy. Breast lesions dry.  Nodular lesion measuring 5.5 x 3.5 cm medially (dry and flat).  Strip of flattening nodularity under axillae 12 x 3 cm.  Medial to lateral skin changes approximately 33 cm x 11 cm up chest wall.  Skin lesions have past midline and are encroaching on the left breast. ABDOMEN:  Soft, non-tender, with active bowel sounds, and no hepatosplenomegaly.  No masses. SKIN: Chest wall lesions as above.  Upper right arm with 5 x 5 cm palpable irregularity s/p biopsy.  EXTREMITIES: No edema, no skin discoloration or tenderness. No palpable cords. LYMPH NODES: No palpable axillary adenopathy  NEUROLOGICAL: Unremarkable. PSYCH:  Appropriate.   Infusion on 07/03/2015  Component Date Value Ref Range Status  . WBC 07/03/2015 5.1  3.6 - 11.0 K/uL Final  . RBC 07/03/2015 3.41* 3.80 - 5.20 MIL/uL Final  . Hemoglobin 07/03/2015 11.1* 12.0 - 16.0 g/dL Final  . HCT 07/03/2015 33.0* 35.0 - 47.0 % Final  . MCV 07/03/2015 96.9  80.0 - 100.0 fL Final  . MCH 07/03/2015 32.6  26.0 - 34.0 pg Final  . MCHC 07/03/2015 33.6  32.0 - 36.0 g/dL Final  . RDW 07/03/2015 16.3* 11.5 - 14.5 % Final  . Platelets 07/03/2015 167  150 - 440 K/uL Final  . Neutrophils Relative % 07/03/2015 49   Final  . Neutro Abs 07/03/2015 2.5  1.4 - 6.5 K/uL Final  . Lymphocytes Relative 07/03/2015 37   Final  . Lymphs Abs 07/03/2015 1.9  1.0 - 3.6 K/uL Final  . Monocytes Relative 07/03/2015 13   Final  . Monocytes Absolute 07/03/2015 0.7  0.2 - 0.9 K/uL Final  . Eosinophils Relative 07/03/2015 1   Final  . Eosinophils Absolute 07/03/2015 0.0  0 - 0.7 K/uL Final  . Basophils Relative 07/03/2015 0   Final  . Basophils Absolute 07/03/2015 0.0  0 - 0.1 K/uL Final  . Sodium 07/03/2015 136  135 - 145 mmol/L Final  . Potassium 07/03/2015 4.0  3.5 - 5.1 mmol/L Final  . Chloride 07/03/2015 104  101 - 111 mmol/L Final  . CO2 07/03/2015 25  22 - 32 mmol/L Final  . Glucose, Bld 07/03/2015 149* 65 - 99 mg/dL Final  . BUN 07/03/2015 16  6 - 20 mg/dL Final  . Creatinine, Ser 07/03/2015 0.46  0.44 - 1.00 mg/dL Final  . Calcium 07/03/2015 9.6  8.9 - 10.3 mg/dL Final  . Total Protein 07/03/2015 7.2  6.5 - 8.1 g/dL Final  . Albumin 07/03/2015 4.1  3.5 - 5.0 g/dL Final  . AST 07/03/2015 18  15 - 41 U/L Final  . ALT 07/03/2015 12* 14 - 54 U/L Final  . Alkaline Phosphatase 07/03/2015 68  38 - 126 U/L Final  . Total Bilirubin 07/03/2015 0.4  0.3 - 1.2 mg/dL Final  . GFR calc non Af Amer 07/03/2015 >60  >60 mL/min Final  . GFR calc Af Amer 07/03/2015 >60  >60 mL/min Final   Comment: (NOTE) The eGFR has been calculated using the CKD EPI equation. This calculation has  not been validated in all clinical situations. eGFR's persistently <60 mL/min signify possible Chronic Kidney Disease.   . Anion gap 07/03/2015 7  5 - 15 Final  . Magnesium 07/03/2015 1.9  1.7 - 2.4 mg/dL Final    Assessment:  LEONTINA SKIDMORE is an 81 y.o. female with stage IIIC right breast cancer status post mastectomy and axillary lymph node dissection on 08/31/2014. 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 Carcinoma involved skeletal muscle and dermal lymphatics. Tumor was less than 0.5 mm from the deep margin. Thirteen of 15 lymph nodes were involved. The largest metastatic focus was 2 cm. Tumor is triple negative (ER negative, PR negative, and HER-2/neu negative).  Androgen receptor and PDL-1 testing was negative.  Pathologic stage was [id number].  PET scan on 09/21/2014 revealed postoperative changes in the right chest with no suspicious findings or residual tumor. Bone scan on 10/11/2014 revealed no evidence of metastatic disease.  Bone density study on 10/02/2014 revealed a T score of -4.7 in the forearm consistent with osteoporosis. T-score was less than 2.5 in the spine or hip. Echo on 10/02/2014 revealed ejection fraction of 60-65%.  Echo on 06/27/2015 revealed EF of 55-60%.  She has iron deficiency anemia. Labs on 09/19/2014 revealed a hematocrit of 29.9, hemoglobin 8.7, MCV 69.7, ferritin 9, and TIBC 616. B12 was low (265) with a prior history of B12 deficiency and need for supplementation (stopped 06/2014). She began B12 on 10/12/2014 (last 04/25/2015).  Folate was normal. Her diet is modest. She has never had a colonoscopy. She denies any melena or hematochezia.  She takes 1 iron pill a day (additional iron causes diarrhea).  Ferritin was 116 on 04/25/2015.  Chest wall biopsy x 3 (lateral, medial, and middle sites) on 11/08/2014 revealed dermal lymphatic involvement by invasive mammary carcinoma with micropapillary  features.  Right arm punch biopsy on 06/28/2015 confirmed breast cancer.  She has aggressive disease with growth despite several chemotherapeutic agents.  She received 5 weeks of Taxol (11/03/2014 - 12/01/2014).  She received 1 week of adriamycin (12/14/2014).  She received 1 cycle of carboplatin (12/28/2014).  After carboplatin, disease appeared to stabilize, but with 2 weeks off of therapy, her disease grew again (medial lesion larger and weaping).   She received 3 cycles of carboplatin and Taxol (01/18/2015 - 03/02/2015).  Chest wall lesions initially decreased then began to grow.  She received 2 cycles of adriamycin and Cytoxan (03/26/2015 - 04/11/2015) with Neulasta support.  ANC nadir was 200 on day 9 (04/03/2015).  Cycle #2 was complicated by mouth sores, decreased oral intake, and weight loss. Chest wall disease began to grow before cycle #3 could be administered.  PET scan on 04/26/2015 revealed extensive recurrence about the right chest.  The far lateral component measured 4.2 x 2.2 cm (S.U.V.13.5).  A portion superficial to the right-side of the sternum measured 5.2 x 2.2 cm (S.U.V. 9.1).  There was diffuse more inferior and lateral chest wall soft tissue thickening and hypermetabolism.  CA27.29 was 118.4 on 05/02/2015 and 38.5 on 07/03/2015.  She received 5 weeks of concurrent carboplatin and radiation (05/14/2015 - 06/11/2015).  Radiation completed on 06/15/2015.  Cutaneous lesions in the field of radiation have improved dramatically.  There is ongoing extension of disease outside of the field of radiation.  Foundation One testing on 05/22/2015 returned genomic findings of ERBB2 (HER2) and amplification of FGFR1.  Symptomatically, she is doing well.  She has disease which is growing outside of the radiation field.  Plan: 1.  Discuss right upper arm skin biopsy results- metastatic breast cancer. 2.  Discuss results of echo. 3.  Review Foundation One testing and Her2/neu + results.   Discuss initiation of single agent Herceptin today.  Side effects reviewed.  Discussed the plan for echo every 3 months.  Discuss consideration of addition of Perjeta or chemotherapy based on response.  Discuss aggressive therapy if needed with Herceptin, Perjeta and Taxotere.  Hopefully will respond to single agent Herceptin, otherwise plan is to intensify therapy.  Patient needs time off of chemotherapy if possible.  Discuss other Her2/neu directed therapies with Herceptin and Perjeta, lapatinib (Tykerb), and ado-trastuzumab emtansine (Kadcyla).  Discussed consideration of the addition of Xeloda with Herceptin.  Patient consented to initiation of Herceptin weekly.  Consider switch to every 3 week Herceptin based on response and patient's tolerance.  4.  Labs today:  CBC with diff, CMP, Mg, CA27.29. 5.  Herceptin today and weekly. 6.  Rx:  Hydrocodone 5 mg/Tylenol 325 mg 1 tablet po q 6 hours prn pain; dis: #30. 7.  RTC in 3 weeks for MD assessment, labs (CBC with diff, CMP, Mg, CA27.29), and week #4 Herceptin.   [name], MD  07/03/2015, 2:22 PM

## 2015-07-04 LAB — CA 27.29 (SERIAL MONITOR): CA 27.29: 38.5 U/mL (ref 0.0–38.6)

## 2015-07-10 ENCOUNTER — Other Ambulatory Visit: Payer: Self-pay | Admitting: Hematology and Oncology

## 2015-07-10 ENCOUNTER — Inpatient Hospital Stay: Payer: PPO

## 2015-07-10 VITALS — BP 123/80 | HR 84 | Resp 20

## 2015-07-10 DIAGNOSIS — C50911 Malignant neoplasm of unspecified site of right female breast: Secondary | ICD-10-CM

## 2015-07-10 DIAGNOSIS — Z5112 Encounter for antineoplastic immunotherapy: Secondary | ICD-10-CM | POA: Diagnosis not present

## 2015-07-10 MED ORDER — DIPHENHYDRAMINE HCL 25 MG PO CAPS
50.0000 mg | ORAL_CAPSULE | Freq: Once | ORAL | Status: AC
  Administered 2015-07-10: 50 mg via ORAL
  Filled 2015-07-10: qty 2

## 2015-07-10 MED ORDER — HEPARIN SOD (PORK) LOCK FLUSH 100 UNIT/ML IV SOLN
500.0000 [IU] | Freq: Once | INTRAVENOUS | Status: AC | PRN
  Administered 2015-07-10: 500 [IU]
  Filled 2015-07-10: qty 5

## 2015-07-10 MED ORDER — SODIUM CHLORIDE 0.9 % IV SOLN
Freq: Once | INTRAVENOUS | Status: AC
  Administered 2015-07-10: 11:00:00 via INTRAVENOUS
  Filled 2015-07-10: qty 1000

## 2015-07-10 MED ORDER — SODIUM CHLORIDE 0.9 % IV SOLN
2.0000 mg/kg | Freq: Once | INTRAVENOUS | Status: AC
  Administered 2015-07-10: 105 mg via INTRAVENOUS
  Filled 2015-07-10: qty 5

## 2015-07-10 MED ORDER — ACETAMINOPHEN 325 MG PO TABS
650.0000 mg | ORAL_TABLET | Freq: Once | ORAL | Status: AC
  Administered 2015-07-10: 650 mg via ORAL
  Filled 2015-07-10: qty 2

## 2015-07-11 ENCOUNTER — Inpatient Hospital Stay: Admission: RE | Admit: 2015-07-11 | Payer: PPO | Source: Ambulatory Visit | Admitting: Radiation Oncology

## 2015-07-11 ENCOUNTER — Ambulatory Visit: Payer: PPO | Admitting: Radiation Oncology

## 2015-07-14 ENCOUNTER — Other Ambulatory Visit: Payer: Self-pay | Admitting: Hematology and Oncology

## 2015-07-17 ENCOUNTER — Inpatient Hospital Stay: Payer: PPO

## 2015-07-17 ENCOUNTER — Encounter: Payer: Self-pay | Admitting: Hematology and Oncology

## 2015-07-17 ENCOUNTER — Other Ambulatory Visit: Payer: Self-pay | Admitting: *Deleted

## 2015-07-17 DIAGNOSIS — C50911 Malignant neoplasm of unspecified site of right female breast: Secondary | ICD-10-CM

## 2015-07-17 DIAGNOSIS — Z5112 Encounter for antineoplastic immunotherapy: Secondary | ICD-10-CM | POA: Diagnosis not present

## 2015-07-17 MED ORDER — DIPHENHYDRAMINE HCL 25 MG PO CAPS
50.0000 mg | ORAL_CAPSULE | Freq: Once | ORAL | Status: AC
  Administered 2015-07-17: 50 mg via ORAL
  Filled 2015-07-17: qty 2

## 2015-07-17 MED ORDER — HEPARIN SOD (PORK) LOCK FLUSH 100 UNIT/ML IV SOLN
500.0000 [IU] | Freq: Once | INTRAVENOUS | Status: AC
  Administered 2015-07-17: 500 [IU] via INTRAVENOUS
  Filled 2015-07-17: qty 5

## 2015-07-17 MED ORDER — SODIUM CHLORIDE 0.9% FLUSH
10.0000 mL | INTRAVENOUS | Status: DC | PRN
  Administered 2015-07-17: 10 mL via INTRAVENOUS
  Filled 2015-07-17: qty 10

## 2015-07-17 MED ORDER — ACETAMINOPHEN 325 MG PO TABS
650.0000 mg | ORAL_TABLET | Freq: Once | ORAL | Status: AC
  Administered 2015-07-17: 650 mg via ORAL
  Filled 2015-07-17: qty 2

## 2015-07-17 MED ORDER — SODIUM CHLORIDE 0.9 % IV SOLN
Freq: Once | INTRAVENOUS | Status: AC
  Administered 2015-07-17: 14:00:00 via INTRAVENOUS
  Filled 2015-07-17: qty 1000

## 2015-07-17 MED ORDER — TEMAZEPAM 7.5 MG PO CAPS: ORAL_CAPSULE | ORAL | Status: DC

## 2015-07-17 MED ORDER — TRASTUZUMAB CHEMO INJECTION 440 MG
2.0000 mg/kg | Freq: Once | INTRAVENOUS | Status: AC
  Administered 2015-07-17: 105 mg via INTRAVENOUS
  Filled 2015-07-17: qty 5

## 2015-07-18 ENCOUNTER — Inpatient Hospital Stay: Payer: PPO

## 2015-07-22 ENCOUNTER — Encounter: Payer: Self-pay | Admitting: Hematology and Oncology

## 2015-07-24 ENCOUNTER — Inpatient Hospital Stay: Payer: PPO

## 2015-07-24 ENCOUNTER — Inpatient Hospital Stay (HOSPITAL_BASED_OUTPATIENT_CLINIC_OR_DEPARTMENT_OTHER): Payer: PPO | Admitting: Hematology and Oncology

## 2015-07-24 VITALS — BP 121/82 | HR 92 | Resp 16 | Wt 108.5 lb

## 2015-07-24 DIAGNOSIS — I10 Essential (primary) hypertension: Secondary | ICD-10-CM

## 2015-07-24 DIAGNOSIS — C50911 Malignant neoplasm of unspecified site of right female breast: Secondary | ICD-10-CM

## 2015-07-24 DIAGNOSIS — M818 Other osteoporosis without current pathological fracture: Secondary | ICD-10-CM | POA: Diagnosis not present

## 2015-07-24 DIAGNOSIS — E785 Hyperlipidemia, unspecified: Secondary | ICD-10-CM

## 2015-07-24 DIAGNOSIS — Z87442 Personal history of urinary calculi: Secondary | ICD-10-CM

## 2015-07-24 DIAGNOSIS — Z7982 Long term (current) use of aspirin: Secondary | ICD-10-CM

## 2015-07-24 DIAGNOSIS — E538 Deficiency of other specified B group vitamins: Secondary | ICD-10-CM

## 2015-07-24 DIAGNOSIS — Z79899 Other long term (current) drug therapy: Secondary | ICD-10-CM

## 2015-07-24 DIAGNOSIS — E876 Hypokalemia: Secondary | ICD-10-CM

## 2015-07-24 DIAGNOSIS — D509 Iron deficiency anemia, unspecified: Secondary | ICD-10-CM

## 2015-07-24 DIAGNOSIS — R21 Rash and other nonspecific skin eruption: Secondary | ICD-10-CM

## 2015-07-24 DIAGNOSIS — Z171 Estrogen receptor negative status [ER-]: Secondary | ICD-10-CM

## 2015-07-24 DIAGNOSIS — I2510 Atherosclerotic heart disease of native coronary artery without angina pectoris: Secondary | ICD-10-CM

## 2015-07-24 DIAGNOSIS — E119 Type 2 diabetes mellitus without complications: Secondary | ICD-10-CM

## 2015-07-24 DIAGNOSIS — M129 Arthropathy, unspecified: Secondary | ICD-10-CM

## 2015-07-24 DIAGNOSIS — C792 Secondary malignant neoplasm of skin: Secondary | ICD-10-CM | POA: Diagnosis not present

## 2015-07-24 DIAGNOSIS — Z5112 Encounter for antineoplastic immunotherapy: Secondary | ICD-10-CM | POA: Diagnosis not present

## 2015-07-24 DIAGNOSIS — Z7984 Long term (current) use of oral hypoglycemic drugs: Secondary | ICD-10-CM

## 2015-07-24 DIAGNOSIS — G893 Neoplasm related pain (acute) (chronic): Secondary | ICD-10-CM

## 2015-07-24 DIAGNOSIS — Z923 Personal history of irradiation: Secondary | ICD-10-CM

## 2015-07-24 LAB — CBC WITH DIFFERENTIAL/PLATELET
Basophils Absolute: 0 10*3/uL (ref 0–0.1)
Basophils Relative: 1 %
Eosinophils Absolute: 0.1 10*3/uL (ref 0–0.7)
Eosinophils Relative: 1 %
HCT: 31 % — ABNORMAL LOW (ref 35.0–47.0)
Hemoglobin: 10.2 g/dL — ABNORMAL LOW (ref 12.0–16.0)
Lymphocytes Relative: 20 %
Lymphs Abs: 1.1 10*3/uL (ref 1.0–3.6)
MCH: 31.2 pg (ref 26.0–34.0)
MCHC: 32.9 g/dL (ref 32.0–36.0)
MCV: 94.9 fL (ref 80.0–100.0)
Monocytes Absolute: 0.6 10*3/uL (ref 0.2–0.9)
Monocytes Relative: 10 %
Neutro Abs: 3.9 10*3/uL (ref 1.4–6.5)
Neutrophils Relative %: 68 %
Platelets: 218 10*3/uL (ref 150–440)
RBC: 3.27 MIL/uL — ABNORMAL LOW (ref 3.80–5.20)
RDW: 15 % — ABNORMAL HIGH (ref 11.5–14.5)
WBC: 5.6 10*3/uL (ref 3.6–11.0)

## 2015-07-24 LAB — COMPREHENSIVE METABOLIC PANEL
ALT: 13 U/L — ABNORMAL LOW (ref 14–54)
AST: 20 U/L (ref 15–41)
Albumin: 4.1 g/dL (ref 3.5–5.0)
Alkaline Phosphatase: 67 U/L (ref 38–126)
Anion gap: 6 (ref 5–15)
BUN: 18 mg/dL (ref 6–20)
CO2: 25 mmol/L (ref 22–32)
Calcium: 8.9 mg/dL (ref 8.9–10.3)
Chloride: 105 mmol/L (ref 101–111)
Creatinine, Ser: 0.47 mg/dL (ref 0.44–1.00)
GFR calc Af Amer: >60 mL/min (ref >60)
GFR calc non Af Amer: >60 mL/min (ref >60)
Glucose, Bld: 150 mg/dL — ABNORMAL HIGH (ref 65–99)
Potassium: 3.9 mmol/L (ref 3.5–5.1)
Sodium: 136 mmol/L (ref 135–145)
Total Bilirubin: 0.5 mg/dL (ref 0.3–1.2)
Total Protein: 7.1 g/dL (ref 6.5–8.1)

## 2015-07-24 LAB — MAGNESIUM: Magnesium: 2 mg/dL (ref 1.7–2.4)

## 2015-07-24 MED ORDER — ACETAMINOPHEN 325 MG PO TABS
650.0000 mg | ORAL_TABLET | Freq: Once | ORAL | Status: AC
Start: 2015-07-24 — End: 2015-07-24
  Administered 2015-07-24: 650 mg via ORAL
  Filled 2015-07-24: qty 2

## 2015-07-24 MED ORDER — HYDROCODONE-ACETAMINOPHEN 5-325 MG PO TABS: 1.0000 | ORAL_TABLET | Freq: Four times a day (QID) | ORAL | Status: DC | PRN

## 2015-07-24 MED ORDER — SODIUM CHLORIDE 0.9 % IV SOLN
Freq: Once | INTRAVENOUS | Status: AC
  Administered 2015-07-24: 15:00:00 via INTRAVENOUS
  Filled 2015-07-24: qty 1000

## 2015-07-24 MED ORDER — DIPHENHYDRAMINE HCL 25 MG PO CAPS
50.0000 mg | ORAL_CAPSULE | Freq: Once | ORAL | Status: AC
  Administered 2015-07-24: 50 mg via ORAL
  Filled 2015-07-24: qty 2

## 2015-07-24 MED ORDER — TRASTUZUMAB CHEMO INJECTION 440 MG
2.0000 mg/kg | Freq: Once | INTRAVENOUS | Status: AC
  Administered 2015-07-24: 105 mg via INTRAVENOUS
  Filled 2015-07-24: qty 5

## 2015-07-24 MED ORDER — HEPARIN SOD (PORK) LOCK FLUSH 100 UNIT/ML IV SOLN
500.0000 [IU] | Freq: Once | INTRAVENOUS | Status: AC | PRN
  Administered 2015-07-24: 500 [IU]
  Filled 2015-07-24: qty 5

## 2015-07-24 MED ORDER — POTASSIUM CHLORIDE CRYS ER 10 MEQ PO TBCR: 10.0000 meq | EXTENDED_RELEASE_TABLET | Freq: Every day | ORAL | Status: DC

## 2015-07-24 MED ORDER — SODIUM CHLORIDE 0.9% FLUSH
10.0000 mL | INTRAVENOUS | Status: DC | PRN
  Administered 2015-07-24: 10 mL
  Filled 2015-07-24: qty 10

## 2015-07-24 MED ORDER — CYANOCOBALAMIN 1000 MCG/ML IJ SOLN
1000.0000 ug | Freq: Once | INTRAMUSCULAR | Status: AC
  Administered 2015-07-24: 1000 ug via INTRAMUSCULAR
  Filled 2015-07-24: qty 1

## 2015-07-24 NOTE — Progress Notes (Addendum)
[facility] day:  07/24/2015   Chief Complaint: Dawn Wiley is an 81 y.o. female with stage IIIC right breast cancer who is seen for assessment prior to week #4 Herceptin.   HPI: The patient was last seen in the medical oncology clinic on 07/03/2015.  At that time, she began weekly Herceptin.  Symptomatically, she denied any new complaints.  Clinically, she had progression of disease outside of the radiation field.  She has received Herceptin weekly (last 07/17/2015).  Symptomatically, she has done well.  She notes some discomfort associated with the chest wall lesions.  She notes continued growth.  Past Medical History  Diagnosis Date  . Kidney stones   . Hyperlipidemia   . Coronary artery disease     Coronary calcifications noted on CT scan  . Diabetes mellitus without complication ([place])   . Nephrolithiasis   . Hypertension   . Arthritis   . Anemia   . Tachycardia     Dr. Fletcher Anon, cardiologist  . Cancer [facility])     breast (right)  . PONV (postoperative nausea and vomiting)   . Rash     back  . Breast cancer [facility])     Past Surgical History  Procedure Laterality Date  . Temporomandibular joint surgery    . Cataract extraction      bilateral  . Breast surgery      breast biopsy X 3  . Eye surgery      bilateral cataract extraction  . Mastectomy modified radical Right 08/31/2014    Procedure: MASTECTOMY MODIFIED RADICAL;  Surgeon: Molly Maduro, MD;  Location: [facility] ORS;  Service: General;  Laterality: Right;  . Portacath placement Left 10/20/2014    Procedure: INSERTION PORT-A-CATH;  Surgeon: Marlyce Huge, MD;  Location: [facility] ORS;  Service: General;  Laterality: Left;    Family History  Problem Relation Age of Onset  . Peripheral Artery Disease Sister     carotid artery stenosis   . Heart disease Sister   . Diabetes Sister     Social History:  reports that she has never smoked. She has never used smokeless  tobacco. She reports that she does not drink alcohol or use illicit drugs.  The patient is alone today.  Her daughter's phone number is [phone].  Her daughter was called at the end of the clinic visit to discuss the current status of disease and plan.  Allergies: No Known Allergies  Current Medications: Current Outpatient Prescriptions  Medication Sig Dispense Refill  . acetaminophen (TYLENOL) 500 MG tablet Take 500 mg by mouth every 6 (six) hours as needed for mild pain or moderate pain.     [name] Kitchen amLODipine (NORVASC) 5 MG tablet take 1 tablet by mouth once daily 90 tablet 1  . aspirin 81 MG tablet Take 81 mg by mouth every other day.    . Calcium Carbonate (CALTRATE 600 PO) Take 1,200 mg by mouth daily.     . cholecalciferol (VITAMIN D) 400 UNITS TABS tablet Take 800 Units by mouth.    . Cyanocobalamin (VITAMIN B-12 IJ) Inject as directed every 30 (thirty) days.     . diphenhydrAMINE (BENADRYL) 25 mg capsule Take 25 mg by mouth every 6 (six) hours as needed for allergies.    . ferrous sulfate 325 (65 FE) MG tablet Take 325 mg by mouth daily with breakfast.     . glucose blood test strip Test blood sugars every morning and after meals DX E  11.19 100 each 12  . HYDROcodone-acetaminophen (NORCO/VICODIN) 5-325 MG tablet Take 1 tablet by mouth every 6 (six) hours as needed for moderate pain. 30 tablet 0  . Lancets (SAFETY LANCET 28G/PRESSURE ACT) MISC 1 each by Other route as needed (to test glucose levels). 50 each 12  . lidocaine-prilocaine (EMLA) cream Apply 1 application topically as needed. Apply 1-2 hours prior to chemotherapy 30 g 1  . loratadine (CLARITIN) 10 MG tablet Take 1 tablet (10 mg total) by mouth daily. 30 tablet 0  . magic mouthwash SOLN Take 5 mLs by mouth 4 (four) times daily. 200 mL 1  . metFORMIN (GLUCOPHAGE) 500 MG tablet Take 1 tablet (500 mg total) by mouth 2 (two) times daily with a meal. 180 tablet 1  . metoprolol tartrate (LOPRESSOR) 25 MG tablet Take 1 tablet (25 mg  total) by mouth 2 (two) times daily. 180 tablet 2  . potassium chloride (K-DUR,KLOR-CON) 10 MEQ tablet Take 1 tablet (10 mEq total) by mouth daily. 30 tablet 1  . temazepam (RESTORIL) 7.5 MG capsule 1- 2 tablets at night as needed for sleep 60 capsule 0   No current facility-administered medications for this visit.   Facility-Administered Medications Ordered in Other Visits  Medication Dose Route Frequency Provider Last Rate Last Dose  . sodium chloride 0.9 % injection 10 mL  10 mL Intravenous PRN [name], MD      . sodium chloride 0.9 % injection 10 mL  10 mL Intravenous PRN [name], MD   10 mL at 03/21/15 0849  . sodium chloride 0.9 % injection 10 mL  10 mL Intracatheter PRN [name], MD   10 mL at 04/25/15 4401    Review of Systems:  GENERAL: Feels "about the same". No fevers or sweats. Weight up 2 pounds. PERFORMANCE STATUS (ECOG): 1 HEENT: No visual changes, runny nose, sore throat, or tenderness. Lungs: No shortness of breath or cough. No hemoptysis. Cardiac: No chest pain, palpitations, orthopnea, or PND. GI: No nausea, vomiting, diarrhea, constipation, melena or hematochezia. GU: No urgency, frequency, dysuria, or hematuria. Musculoskeletal: No back pain. No joint pain. No muscle tenderness. Extremities: No pain or swelling. Skin: Chest wall lesions radiated (early progression).  New areas expanding outside of radiation field. Neuro: No headache, numbness or weakness, balance or coordination issues. Endocrine: Diabetes.  No thyroid issues, hot flashes or night sweats. Psych: No mood changes, depression or anxiety.  trouble sleeping. Pain: No focal pain. Review of systems: All other systems reviewed and found to be negative.  Physical Exam: Blood pressure 121/82, pulse 92, resp. rate 16, weight 108 lb 7.5 oz (49.2 kg). GENERAL: Thin elderly woman sitting comfortably in the exam room in no acute distress. MENTAL STATUS: Alert  and oriented to person, place and time. HEAD: Short silver wig. Normocephalic, atraumatic, face symmetric, no Cushingoid features. EYES: Blue eyes. Pupils equal round and reactive to light and accomodation.  No conjunctivitis or scleral icterus. ENT:  Oropharynx clear without lesion.  Tongue normal. Mucous membranes moist.  RESPIRATORY:  Clear to auscultation without rales, wheezes or rhonchi. CARDIOVASCULAR:  Regular rate and rhythm without murmur, rub or gallop. BREAST: Right mastectomy. Breast lesions dry.  Nodular lesion measuring 5.5 x 3.5 cm medially (dry and flat).  Medial to lateral skin changes approximately 36 cm.  Nodular progression posteriorly on back.  Skin lesions have past midline and have extended onto the left breast. ABDOMEN:  Soft, non-tender, with active bowel sounds, and no hepatosplenomegaly.  No masses. SKIN: Chest wall lesions as above.  Upper right arm disease progressive. EXTREMITIES: No edema, no skin discoloration or tenderness. No palpable cords. LYMPH NODES: No palpable axillary adenopathy  NEUROLOGICAL: Unremarkable. PSYCH: Appropriate.   Appointment on 07/24/2015  Component Date Value Ref Range Status  . WBC 07/24/2015 5.6  3.6 - 11.0 K/uL Final  . RBC 07/24/2015 3.27* 3.80 - 5.20 MIL/uL Final  . Hemoglobin 07/24/2015 10.2* 12.0 - 16.0 g/dL Final  . HCT 07/24/2015 31.0* 35.0 - 47.0 % Final  . MCV 07/24/2015 94.9  80.0 - 100.0 fL Final  . MCH 07/24/2015 31.2  26.0 - 34.0 pg Final  . MCHC 07/24/2015 32.9  32.0 - 36.0 g/dL Final  . RDW 07/24/2015 15.0* 11.5 - 14.5 % Final  . Platelets 07/24/2015 218  150 - 440 K/uL Final  . Neutrophils Relative % 07/24/2015 68   Final  . Neutro Abs 07/24/2015 3.9  1.4 - 6.5 K/uL Final  . Lymphocytes Relative 07/24/2015 20   Final  . Lymphs Abs 07/24/2015 1.1  1.0 - 3.6 K/uL Final  . Monocytes Relative 07/24/2015 10   Final  . Monocytes Absolute 07/24/2015 0.6  0.2 - 0.9 K/uL Final  . Eosinophils Relative  07/24/2015 1   Final  . Eosinophils Absolute 07/24/2015 0.1  0 - 0.7 K/uL Final  . Basophils Relative 07/24/2015 1   Final  . Basophils Absolute 07/24/2015 0.0  0 - 0.1 K/uL Final  . Sodium 07/24/2015 136  135 - 145 mmol/L Final  . Potassium 07/24/2015 3.9  3.5 - 5.1 mmol/L Final  . Chloride 07/24/2015 105  101 - 111 mmol/L Final  . CO2 07/24/2015 25  22 - 32 mmol/L Final  . Glucose, Bld 07/24/2015 150* 65 - 99 mg/dL Final  . BUN 07/24/2015 18  6 - 20 mg/dL Final  . Creatinine, Ser 07/24/2015 0.47  0.44 - 1.00 mg/dL Final  . Calcium 07/24/2015 8.9  8.9 - 10.3 mg/dL Final  . Total Protein 07/24/2015 7.1  6.5 - 8.1 g/dL Final  . Albumin 07/24/2015 4.1  3.5 - 5.0 g/dL Final  . AST 07/24/2015 20  15 - 41 U/L Final  . ALT 07/24/2015 13* 14 - 54 U/L Final  . Alkaline Phosphatase 07/24/2015 67  38 - 126 U/L Final  . Total Bilirubin 07/24/2015 0.5  0.3 - 1.2 mg/dL Final  . GFR calc non Af Amer 07/24/2015 >60  >60 mL/min Final  . GFR calc Af Amer 07/24/2015 >60  >60 mL/min Final   Comment: (NOTE) The eGFR has been calculated using the CKD EPI equation. This calculation has not been validated in all clinical situations. eGFR's persistently <60 mL/min signify possible Chronic Kidney Disease.   . Anion gap 07/24/2015 6  5 - 15 Final  . Magnesium 07/24/2015 2.0  1.7 - 2.4 mg/dL Final    Assessment:  HOLLEE FATE is an 81 y.o. female with stage IIIC right breast cancer status post mastectomy and axillary lymph node dissection on 08/31/2014. 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 Carcinoma involved skeletal muscle and dermal lymphatics. Tumor was less than 0.5 mm from the deep margin. Thirteen of 15 lymph nodes were involved. The largest metastatic focus was 2 cm. Tumor is triple negative (ER negative, PR negative, and HER-2/neu negative).  Androgen receptor and PDL-1 testing was negative.  Pathologic stage was [id number].  PET  scan on 09/21/2014 revealed postoperative changes in the right chest with no suspicious  findings or residual tumor. Bone scan on 10/11/2014 revealed no evidence of metastatic disease.  Bone density study on 10/02/2014 revealed a T score of -4.7 in the forearm consistent with osteoporosis. T-score was less than 2.5 in the spine or hip. Echo on 10/02/2014 revealed ejection fraction of 60-65%.  Echo on 06/27/2015 revealed EF of 55-60%.  She has iron deficiency anemia. Labs on 09/19/2014 revealed a hematocrit of 29.9, hemoglobin 8.7, MCV 69.7, ferritin 9, and TIBC 616. B12 was low (265) with a prior history of B12 deficiency and need for supplementation (stopped 06/2014). She began B12 on 10/12/2014 (last 04/25/2015).  Folate was normal. Her diet is modest. She has never had a colonoscopy. She denies any melena or hematochezia.  She takes 1 iron pill a day (additional iron causes diarrhea).  Ferritin was 116 on 04/25/2015.  Chest wall biopsy x 3 (lateral, medial, and middle sites) on 11/08/2014 revealed dermal lymphatic involvement by invasive mammary carcinoma with micropapillary features.  Right arm punch biopsy on 06/28/2015 confirmed breast cancer.  She has aggressive disease with growth despite several chemotherapeutic agents.  She received 5 weeks of Taxol (11/03/2014 - 12/01/2014).  She received 1 week of adriamycin (12/14/2014).  She received 1 cycle of carboplatin (12/28/2014).  After carboplatin, disease appeared to stabilize, but with 2 weeks off of therapy, her disease grew again (medial lesion larger and weaping).   She received 3 cycles of carboplatin and Taxol (01/18/2015 - 03/02/2015).  Chest wall lesions initially decreased then began to grow.  She received 2 cycles of adriamycin and Cytoxan (03/26/2015 - 04/11/2015) with Neulasta support.  ANC nadir was 200 on day 9 (04/03/2015).  Cycle #2 was complicated by mouth sores, decreased oral intake, and weight loss. Chest wall disease began  to grow before cycle #3 could be administered.  PET scan on 04/26/2015 revealed extensive recurrence about the right chest.  The far lateral component measured 4.2 x 2.2 cm (S.U.V.13.5).  A portion superficial to the right-side of the sternum measured 5.2 x 2.2 cm (S.U.V. 9.1).  There was diffuse more inferior and lateral chest wall soft tissue thickening and hypermetabolism.    CA27.29 was 118.4 on 05/02/2015, 92.9 on 06/11/2015, 38.5 on 07/03/2015, and 27.4 on 07/24/2015.  She received 5 weeks of concurrent carboplatin and radiation (05/14/2015 - 06/11/2015).  Radiation completed on 06/15/2015.  Cutaneous lesions in the field of radiation improved dramatically.    [employer] testing on 05/22/2015 returned genomic alterations of ERBB2 (S310F and V777L), FGFR1 (amplification), CDH1 (E490*), CDKN2A (p16INK4a H83Y and p14ARF A97V), MYST3 (amplification), TERT (promotor -124C>T), TP53  (Y220C), and ZNF703 (amplification).  FDA approved therapies included ado-trastuzumab, lapatinib, pertuzumab, trastuzumab for ERBB2 as well as afatinib (approved in other tumor type) and pazopanib and ponatinib (approved in other tumor types for FGFR1).  She has received 3 weeks of Herceptin (07/03/2015 - 07/17/2015).  Symptomatically, she is tolerating therapy well.  Disease has continued to slowly extend outside of the radiation field.  Plan: 1.  Labs today:  CBC with diff, CMP, CA27.29. 2.  Continue Herceptin. 3.  Discuss treatment options including addition of other Her2 agents with or without chemotherapy (Herceptin, Perjeta, and Taxotere, or Herceptin + Xeloda, or lapatinib + Xeloda).  Discuss potential clinical trial enrollment. 4.  Contact [facility] regarding potential clinical trial enrollment. 5.  Rx:  Hydrocodone 5 mg/Tylenol 325 mg 1 tablet po q 6 hours prn pain; dis: #30. 6.  Rx:  Potassium chloride. 7.  RTC in 1 week for  MD assessment, labs (CBC with diff, CMP, Mg), and Herceptin, Perjeta, and  Taxotere.  Addendum:  Reviewed [facility] testing with Dr. Lucile Crater at [facility].  She appears to be eligible for the neratinib trial.  Will send records to [facility] to confirm protocol eligibility.   Off protocol would consider laptainib + Xeloda.    [name], MD  07/24/2015, 2:16 PM

## 2015-07-24 NOTE — Progress Notes (Signed)
Patient has a discomfort in her right side.

## 2015-07-25 LAB — CANCER ANTIGEN 27.29: CA 27.29: 27.4 U/mL (ref 0.0–38.6)

## 2015-07-26 LAB — SURGICAL PATHOLOGY

## 2015-07-27 ENCOUNTER — Other Ambulatory Visit: Payer: Self-pay | Admitting: Hematology and Oncology

## 2015-07-30 ENCOUNTER — Encounter: Payer: Self-pay | Admitting: Hematology and Oncology

## 2015-07-31 ENCOUNTER — Other Ambulatory Visit: Payer: Self-pay | Admitting: Hematology and Oncology

## 2015-07-31 ENCOUNTER — Inpatient Hospital Stay: Payer: PPO

## 2015-07-31 ENCOUNTER — Encounter: Payer: Self-pay | Admitting: Hematology and Oncology

## 2015-07-31 ENCOUNTER — Inpatient Hospital Stay: Payer: PPO | Attending: Hematology and Oncology

## 2015-07-31 ENCOUNTER — Encounter: Payer: Self-pay | Admitting: *Deleted

## 2015-07-31 ENCOUNTER — Inpatient Hospital Stay: Payer: PPO | Attending: Hematology and Oncology | Admitting: Hematology and Oncology

## 2015-07-31 VITALS — BP 124/71 | HR 90 | Resp 18

## 2015-07-31 VITALS — BP 137/81 | HR 89 | Temp 96.8°F | Resp 18 | Ht 61.0 in | Wt 109.2 lb

## 2015-07-31 DIAGNOSIS — Z79899 Other long term (current) drug therapy: Secondary | ICD-10-CM | POA: Diagnosis not present

## 2015-07-31 DIAGNOSIS — Z9011 Acquired absence of right breast and nipple: Secondary | ICD-10-CM | POA: Diagnosis not present

## 2015-07-31 DIAGNOSIS — E538 Deficiency of other specified B group vitamins: Secondary | ICD-10-CM | POA: Insufficient documentation

## 2015-07-31 DIAGNOSIS — E785 Hyperlipidemia, unspecified: Secondary | ICD-10-CM

## 2015-07-31 DIAGNOSIS — IMO0001 Reserved for inherently not codable concepts without codable children: Secondary | ICD-10-CM

## 2015-07-31 DIAGNOSIS — Z9221 Personal history of antineoplastic chemotherapy: Secondary | ICD-10-CM | POA: Diagnosis not present

## 2015-07-31 DIAGNOSIS — Z7984 Long term (current) use of oral hypoglycemic drugs: Secondary | ICD-10-CM

## 2015-07-31 DIAGNOSIS — G893 Neoplasm related pain (acute) (chronic): Secondary | ICD-10-CM

## 2015-07-31 DIAGNOSIS — Z171 Estrogen receptor negative status [ER-]: Secondary | ICD-10-CM

## 2015-07-31 DIAGNOSIS — Z87442 Personal history of urinary calculi: Secondary | ICD-10-CM | POA: Diagnosis not present

## 2015-07-31 DIAGNOSIS — M129 Arthropathy, unspecified: Secondary | ICD-10-CM

## 2015-07-31 DIAGNOSIS — R000 Tachycardia, unspecified: Secondary | ICD-10-CM

## 2015-07-31 DIAGNOSIS — I2510 Atherosclerotic heart disease of native coronary artery without angina pectoris: Secondary | ICD-10-CM

## 2015-07-31 DIAGNOSIS — C779 Secondary and unspecified malignant neoplasm of lymph node, unspecified: Secondary | ICD-10-CM | POA: Diagnosis not present

## 2015-07-31 DIAGNOSIS — R21 Rash and other nonspecific skin eruption: Secondary | ICD-10-CM

## 2015-07-31 DIAGNOSIS — C7989 Secondary malignant neoplasm of other specified sites: Secondary | ICD-10-CM

## 2015-07-31 DIAGNOSIS — Z5112 Encounter for antineoplastic immunotherapy: Secondary | ICD-10-CM | POA: Insufficient documentation

## 2015-07-31 DIAGNOSIS — I10 Essential (primary) hypertension: Secondary | ICD-10-CM | POA: Diagnosis not present

## 2015-07-31 DIAGNOSIS — Z923 Personal history of irradiation: Secondary | ICD-10-CM | POA: Diagnosis not present

## 2015-07-31 DIAGNOSIS — E119 Type 2 diabetes mellitus without complications: Secondary | ICD-10-CM

## 2015-07-31 DIAGNOSIS — C50911 Malignant neoplasm of unspecified site of right female breast: Secondary | ICD-10-CM

## 2015-07-31 DIAGNOSIS — D509 Iron deficiency anemia, unspecified: Secondary | ICD-10-CM | POA: Insufficient documentation

## 2015-07-31 DIAGNOSIS — M818 Other osteoporosis without current pathological fracture: Secondary | ICD-10-CM

## 2015-07-31 DIAGNOSIS — Z7982 Long term (current) use of aspirin: Secondary | ICD-10-CM | POA: Diagnosis not present

## 2015-07-31 DIAGNOSIS — D649 Anemia, unspecified: Secondary | ICD-10-CM

## 2015-07-31 LAB — COMPREHENSIVE METABOLIC PANEL
ALT: 12 U/L — ABNORMAL LOW (ref 14–54)
AST: 21 U/L (ref 15–41)
Albumin: 3.9 g/dL (ref 3.5–5.0)
Alkaline Phosphatase: 61 U/L (ref 38–126)
Anion gap: 5 (ref 5–15)
BUN: 18 mg/dL (ref 6–20)
CO2: 26 mmol/L (ref 22–32)
Calcium: 9 mg/dL (ref 8.9–10.3)
Chloride: 105 mmol/L (ref 101–111)
Creatinine, Ser: 0.47 mg/dL (ref 0.44–1.00)
GFR calc Af Amer: >60 mL/min (ref >60)
GFR calc non Af Amer: >60 mL/min (ref >60)
Glucose, Bld: 171 mg/dL — ABNORMAL HIGH (ref 65–99)
Potassium: 3.7 mmol/L (ref 3.5–5.1)
Sodium: 136 mmol/L (ref 135–145)
Total Bilirubin: 0.5 mg/dL (ref 0.3–1.2)
Total Protein: 6.8 g/dL (ref 6.5–8.1)

## 2015-07-31 LAB — CBC WITH DIFFERENTIAL/PLATELET
Basophils Absolute: 0 10*3/uL (ref 0–0.1)
Basophils Relative: 1 %
Eosinophils Absolute: 0.1 10*3/uL (ref 0–0.7)
Eosinophils Relative: 2 %
HCT: 28.9 % — ABNORMAL LOW (ref 35.0–47.0)
Hemoglobin: 9.9 g/dL — ABNORMAL LOW (ref 12.0–16.0)
Lymphocytes Relative: 18 %
Lymphs Abs: 0.9 10*3/uL — ABNORMAL LOW (ref 1.0–3.6)
MCH: 32.1 pg (ref 26.0–34.0)
MCHC: 34.1 g/dL (ref 32.0–36.0)
MCV: 94.3 fL (ref 80.0–100.0)
Monocytes Absolute: 0.6 10*3/uL (ref 0.2–0.9)
Monocytes Relative: 11 %
Neutro Abs: 3.7 10*3/uL (ref 1.4–6.5)
Neutrophils Relative %: 68 %
Platelets: 218 10*3/uL (ref 150–440)
RBC: 3.07 MIL/uL — ABNORMAL LOW (ref 3.80–5.20)
RDW: 15.2 % — ABNORMAL HIGH (ref 11.5–14.5)
WBC: 5.3 10*3/uL (ref 3.6–11.0)

## 2015-07-31 LAB — MAGNESIUM: Magnesium: 1.8 mg/dL (ref 1.7–2.4)

## 2015-07-31 MED ORDER — HEPARIN SOD (PORK) LOCK FLUSH 100 UNIT/ML IV SOLN
500.0000 [IU] | Freq: Once | INTRAVENOUS | Status: AC | PRN
  Administered 2015-07-31: 500 [IU]
  Filled 2015-07-31: qty 5

## 2015-07-31 MED ORDER — ACETAMINOPHEN 325 MG PO TABS
650.0000 mg | ORAL_TABLET | Freq: Once | ORAL | Status: AC
  Administered 2015-07-31: 650 mg via ORAL
  Filled 2015-07-31: qty 2

## 2015-07-31 MED ORDER — TRASTUZUMAB CHEMO INJECTION 440 MG
2.0000 mg/kg | Freq: Once | INTRAVENOUS | Status: AC
  Administered 2015-07-31: 105 mg via INTRAVENOUS
  Filled 2015-07-31: qty 5

## 2015-07-31 MED ORDER — DIPHENHYDRAMINE HCL 25 MG PO CAPS
50.0000 mg | ORAL_CAPSULE | Freq: Once | ORAL | Status: AC
  Administered 2015-07-31: 50 mg via ORAL
  Filled 2015-07-31: qty 2

## 2015-07-31 MED ORDER — SODIUM CHLORIDE 0.9 % IV SOLN
Freq: Once | INTRAVENOUS | Status: AC
  Administered 2015-07-31: 10:00:00 via INTRAVENOUS
  Filled 2015-07-31: qty 1000

## 2015-07-31 NOTE — Progress Notes (Signed)
[facility] day:  07/31/2015   Chief Complaint: Dawn Wiley is an 81 y.o. female with stage IIIC right breast cancer who is seen for assessment prior to week #5 Herceptin.   HPI: The patient was last seen in the medical oncology clinic on 07/24/2015.  At that time, she received week #4 Herceptin.  Chest wall disease was slowly growing.  We discussed other agents and combination of Her2/neu directed therapy plus chemotherapy based on Foundation One testing.  During the interim, she has done well.  She notes a little discomfort associated with the lesions on her arm, under her left breast, and on her back.  She denies Dawn other systemic symptoms.  Past Medical History  Diagnosis Date  . Kidney stones   . Hyperlipidemia   . Coronary artery disease     Coronary calcifications noted on CT scan  . Diabetes mellitus without complication ([place])   . Nephrolithiasis   . Hypertension   . Arthritis   . Anemia   . Tachycardia     Dr. Fletcher Anon, cardiologist  . Cancer [facility])     breast (right)  . PONV (postoperative nausea and vomiting)   . Rash     back  . Breast cancer [facility])     Past Surgical History  Procedure Laterality Date  . Temporomandibular joint surgery    . Cataract extraction      bilateral  . Breast surgery      breast biopsy X 3  . Eye surgery      bilateral cataract extraction  . Mastectomy modified radical Right 08/31/2014    Procedure: MASTECTOMY MODIFIED RADICAL;  Surgeon: Molly Maduro, MD;  Location: [facility] ORS;  Service: General;  Laterality: Right;  . Portacath placement Left 10/20/2014    Procedure: INSERTION PORT-A-CATH;  Surgeon: Marlyce Huge, MD;  Location: [facility] ORS;  Service: General;  Laterality: Left;    Family History  Problem Relation Age of Onset  . Peripheral Artery Disease Sister     carotid artery stenosis   . Heart disease Sister   . Diabetes Sister     Social History:  reports that she has  never smoked. She has never used smokeless tobacco. She reports that she does not drink alcohol or use illicit drugs.  The patient is alone today.  Her daughter's phone number is [phone].  Her daughter was called during the clinic visit to discuss potential travel to [facility] for protocol enrollment if eligibility confirmed.  Allergies: No Known Allergies  Current Medications: Current Outpatient Prescriptions  Medication Sig Dispense Refill  . acetaminophen (TYLENOL) 500 MG tablet Take 500 mg by mouth every 6 (six) hours as needed for mild pain or moderate pain.     [name] Kitchen amLODipine (NORVASC) 5 MG tablet take 1 tablet by mouth once daily 90 tablet 1  . aspirin 81 MG tablet Take 81 mg by mouth every other day.    . Calcium Carbonate (CALTRATE 600 PO) Take 1,200 mg by mouth daily.     . cholecalciferol (VITAMIN D) 400 UNITS TABS tablet Take 800 Units by mouth.    . Cyanocobalamin (VITAMIN B-12 IJ) Inject as directed every 30 (thirty) days.     . diphenhydrAMINE (BENADRYL) 25 mg capsule Take 25 mg by mouth every 6 (six) hours as needed for allergies.    . ferrous sulfate 325 (65 FE) MG tablet Take 325 mg by mouth daily with breakfast.     . glucose  blood test strip Test blood sugars every morning and after meals DX E 11.19 100 each 12  . HYDROcodone-acetaminophen (NORCO/VICODIN) 5-325 MG tablet Take 1 tablet by mouth every 6 (six) hours as needed for moderate pain. 30 tablet 0  . Lancets (SAFETY LANCET 28G/PRESSURE ACT) MISC 1 each by Other route as needed (to test glucose levels). 50 each 12  . lidocaine-prilocaine (EMLA) cream Apply 1 application topically as needed. Apply 1-2 hours prior to chemotherapy 30 g 1  . loratadine (CLARITIN) 10 MG tablet Take 1 tablet (10 mg total) by mouth daily. 30 tablet 0  . magic mouthwash SOLN Take 5 mLs by mouth 4 (four) times daily. 200 mL 1  . metFORMIN (GLUCOPHAGE) 500 MG tablet Take 1 tablet (500 mg total) by mouth 2 (two) times daily with a meal. 180 tablet 1   . metoprolol tartrate (LOPRESSOR) 25 MG tablet Take 1 tablet (25 mg total) by mouth 2 (two) times daily. 180 tablet 2  . potassium chloride (K-DUR,KLOR-CON) 10 MEQ tablet Take 1 tablet (10 mEq total) by mouth daily. 30 tablet 1  . temazepam (RESTORIL) 7.5 MG capsule 1- 2 tablets at night as needed for sleep 60 capsule 0   No current facility-administered medications for this visit.   Facility-Administered Medications Ordered in Other Visits  Medication Dose Route Frequency Provider Last Rate Last Dose  . sodium chloride 0.9 % injection 10 mL  10 mL Intravenous PRN [name], MD      . sodium chloride 0.9 % injection 10 mL  10 mL Intravenous PRN [name], MD   10 mL at 03/21/15 0849  . sodium chloride 0.9 % injection 10 mL  10 mL Intracatheter PRN [name], MD   10 mL at 04/25/15 9518    Review of Systems:  GENERAL: Feels "about the same". No fevers or sweats. Weight up 1 pound. PERFORMANCE STATUS (ECOG): 1 HEENT: No visual changes, runny nose, sore throat, or tenderness. Lungs: No shortness of breath or cough. No hemoptysis. Cardiac: No chest pain, palpitations, orthopnea, or PND. GI: No nausea, vomiting, diarrhea, constipation, melena or hematochezia. GU: No urgency, frequency, dysuria, or hematuria. Musculoskeletal: No back pain. No joint pain. No muscle tenderness. Extremities: No pain or swelling. Skin: Chest wall lesions radiated (early progression).  Breast cancer continues to grow outside of radiation field. Neuro: No headache, numbness or weakness, balance or coordination issues. Endocrine: Diabetes.  No thyroid issues, hot flashes or night sweats. Psych: No mood changes, depression or anxiety.  trouble sleeping. Pain: No focal pain. Review of systems: All other systems reviewed and found to be negative.  Physical Exam: Blood pressure 137/81, pulse 89, temperature 96.8 F (36 C), temperature source Tympanic, resp. rate 18,  height _0  (1.549 m), weight 109 lb 3.8 oz (49.55 kg). GENERAL: Thin elderly woman sitting comfortably in the exam room in no acute distress. MENTAL STATUS: Alert and oriented to person, place and time. HEAD: Short silver wig. Normocephalic, atraumatic, face symmetric, no Cushingoid features. EYES: Blue eyes. Pupils equal round and reactive to light and accomodation.  No conjunctivitis or scleral icterus. RESPIRATORY:  Clear to auscultation without rales, wheezes or rhonchi. CARDIOVASCULAR:  Regular rate and rhythm without murmur, rub or gallop. BREAST: Right mastectomy. Breast lesions dry.  Nodular right chest wall medial lesion measuring 5 x 3 cm (dry with somewhat fine nodular surface).  Medial to lateral skin changes 37 cm x 11 cm cranial to caudal extension.  Nodular lesion  on right lateral back 6.5 x 9 cm.  Nodular lesion extending beneath left breast 4.5 x 3.5 cm. ABDOMEN:  Soft, non-tender, with active bowel sounds, and no hepatosplenomegaly.  No masses. SKIN: Chest wall lesions as above.  Upper right arm 6.5 x 4 cm nodular lesion with purple hue. EXTREMITIES: No edema, no skin discoloration or tenderness. No palpable cords. LYMPH NODES: No palpable axillary adenopathy  NEUROLOGICAL: Unremarkable. PSYCH: Appropriate.   Infusion on 07/31/2015  Component Date Value Ref Range Status  . WBC 07/31/2015 5.3  3.6 - 11.0 K/uL Final  . RBC 07/31/2015 3.07* 3.80 - 5.20 MIL/uL Final  . Hemoglobin 07/31/2015 9.9* 12.0 - 16.0 g/dL Final  . HCT 07/31/2015 28.9* 35.0 - 47.0 % Final  . MCV 07/31/2015 94.3  80.0 - 100.0 fL Final  . MCH 07/31/2015 32.1  26.0 - 34.0 pg Final  . MCHC 07/31/2015 34.1  32.0 - 36.0 g/dL Final  . RDW 07/31/2015 15.2* 11.5 - 14.5 % Final  . Platelets 07/31/2015 218  150 - 440 K/uL Final  . Neutrophils Relative % 07/31/2015 68   Final  . Neutro Abs 07/31/2015 3.7  1.4 - 6.5 K/uL Final  . Lymphocytes Relative 07/31/2015 18   Final  . Lymphs Abs 07/31/2015 0.9*  1.0 - 3.6 K/uL Final  . Monocytes Relative 07/31/2015 11   Final  . Monocytes Absolute 07/31/2015 0.6  0.2 - 0.9 K/uL Final  . Eosinophils Relative 07/31/2015 2   Final  . Eosinophils Absolute 07/31/2015 0.1  0 - 0.7 K/uL Final  . Basophils Relative 07/31/2015 1   Final  . Basophils Absolute 07/31/2015 0.0  0 - 0.1 K/uL Final  . Sodium 07/31/2015 136  135 - 145 mmol/L Final  . Potassium 07/31/2015 3.7  3.5 - 5.1 mmol/L Final  . Chloride 07/31/2015 105  101 - 111 mmol/L Final  . CO2 07/31/2015 26  22 - 32 mmol/L Final  . Glucose, Bld 07/31/2015 171* 65 - 99 mg/dL Final  . BUN 07/31/2015 18  6 - 20 mg/dL Final  . Creatinine, Ser 07/31/2015 0.47  0.44 - 1.00 mg/dL Final  . Calcium 07/31/2015 9.0  8.9 - 10.3 mg/dL Final  . Total Protein 07/31/2015 6.8  6.5 - 8.1 g/dL Final  . Albumin 07/31/2015 3.9  3.5 - 5.0 g/dL Final  . AST 07/31/2015 21  15 - 41 U/L Final  . ALT 07/31/2015 12* 14 - 54 U/L Final  . Alkaline Phosphatase 07/31/2015 61  38 - 126 U/L Final  . Total Bilirubin 07/31/2015 0.5  0.3 - 1.2 mg/dL Final  . GFR calc non Af Amer 07/31/2015 >60  >60 mL/min Final  . GFR calc Af Amer 07/31/2015 >60  >60 mL/min Final   Comment: (NOTE) The eGFR has been calculated using the CKD EPI equation. This calculation has not been validated in all clinical situations. eGFR's persistently <60 mL/min signify possible Chronic Kidney Disease.   . Anion gap 07/31/2015 5  5 - 15 Final  . Magnesium 07/31/2015 1.8  1.7 - 2.4 mg/dL Final    Assessment:  Dawn Wiley is an 81 y.o. female with stage IIIC right breast cancer status post mastectomy and axillary lymph node dissection on 08/31/2014. 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 Carcinoma involved skeletal muscle and dermal lymphatics. Tumor was less than 0.5 mm from the deep margin. Thirteen of 15 lymph nodes were involved. The largest metastatic focus was 2 cm. Tumor is triple  negative (ER negative, PR negative, and HER-2/neu negative).  Androgen receptor and PDL-1 testing was negative.  Pathologic stage was [id number].  PET scan on 09/21/2014 revealed postoperative changes in the right chest with no suspicious findings or residual tumor. Bone scan on 10/11/2014 revealed no evidence of metastatic disease.  Bone density study on 10/02/2014 revealed a T score of -4.7 in the forearm consistent with osteoporosis. T-score was less than 2.5 in the spine or hip. Echo on 10/02/2014 revealed ejection fraction of 60-65%.  Echo on 06/27/2015 revealed EF of 55-60%.  She has iron deficiency anemia. Labs on 09/19/2014 revealed a hematocrit of 29.9, hemoglobin 8.7, MCV 69.7, ferritin 9, and TIBC 616. B12 was low (265) with a prior history of B12 deficiency and need for supplementation (stopped 06/2014). She began B12 on 10/12/2014 (last 04/25/2015).  Folate was normal. Her diet is modest. She has never had a colonoscopy. She denies Dawn melena or hematochezia.  She takes 1 iron pill a day (additional iron causes diarrhea).  Ferritin was 116 on 04/25/2015.  Chest wall biopsy x 3 (lateral, medial, and middle sites) on 11/08/2014 revealed dermal lymphatic involvement by invasive mammary carcinoma with micropapillary features.  Right arm punch biopsy on 06/28/2015 confirmed breast cancer.  She has aggressive disease with growth despite several chemotherapeutic agents.  She received 5 weeks of Taxol (11/03/2014 - 12/01/2014).  She received 1 week of adriamycin (12/14/2014).  She received 1 cycle of carboplatin (12/28/2014).  After carboplatin, disease appeared to stabilize, but with 2 weeks off of therapy, her disease grew again (medial lesion larger and weaping).   She received 3 cycles of carboplatin and Taxol (01/18/2015 - 03/02/2015).  Chest wall lesions initially decreased then began to grow.  She received 2 cycles of adriamycin and Cytoxan (03/26/2015 - 04/11/2015) with Neulasta support.   ANC nadir was 200 on day 9 (04/03/2015).  Cycle #2 was complicated by mouth sores, decreased oral intake, and weight loss. Chest wall disease began to grow before cycle #3 could be administered.  PET scan on 04/26/2015 revealed extensive recurrence about the right chest.  The far lateral component measured 4.2 x 2.2 cm (S.U.V.13.5).  A portion superficial to the right-side of the sternum measured 5.2 x 2.2 cm (S.U.V. 9.1).  There was diffuse more inferior and lateral chest wall soft tissue thickening and hypermetabolism.    CA27.29 was 118.4 on 05/02/2015, 92.9 on 06/11/2015, 38.5 on 07/03/2015, and 27.4 on 07/24/2015.  She received 5 weeks of concurrent carboplatin and radiation (05/14/2015 - 06/11/2015).  Radiation completed on 06/15/2015.  Cutaneous lesions in the field of radiation improved dramatically.    Foundation One testing on 05/22/2015 returned genomic alterations of ERBB2 (S310F and V777L), FGFR1 (amplification), CDH1 (E490*), CDKN2A (p16INK4a H83Y and p14ARF A97V), MYST3 (amplification), TERT (promotor -124C>T), TP53  (Y220C), and ZNF703 (amplification).  FDA approved therapies included ado-trastuzumab, lapatinib, pertuzumab, trastuzumab for ERBB2 as well as afatinib (approved in other tumor type) and pazopanib and ponatinib (approved in other tumor types for FGFR1).  She has received 4 weeks of Herceptin (07/03/2015 - 07/24/2015).  She is tolerating infusions well.  Symptomatically, she is tolerating therapy well.  Disease is slowly extending outside of the radiation field.  Plan: 1.  Labs today:  CBC with diff, CMP, Mg. 2.  Discuss potential protocol enrollment at [facility] on neratinib trial (discussion with Dr. Beverley Fiedler).  Await call back form protocol nurse if patient meets eligibility criteria.  Information faxed.  Discussed need for retesting of breast cancer  specimen to confirm Foundation One results per protocol.  Discuss potential delay before treatment, time commitment at [facility], and  ongoing follow-up in clinic. 3.  Discuss treatment lapatinib + Xeloda if ineligible for enrollment. 4.  Discuss continuation of Herceptin as may be slowing disease and slight decrease in CA27.29. 5.  Anticipate protocol enrollment at [facility].     [name], MD  07/31/2015, 3:27 PM

## 2015-07-31 NOTE — Progress Notes (Signed)
Pt reports that nothing has changed since last visit the only difference is that had a couple of days where the knots hurt.

## 2015-07-31 NOTE — Progress Notes (Signed)
  Oncology Nurse Navigator Documentation  Navigator Location: [facility] (07/31/15 1000) Navigator Encounter Type: Treatment (07/31/15 1000)           Patient Visit Type: Follow-up (chemo) (07/31/15 1000)       Interventions:  (offered support) (07/31/15 1000)                      Time Spent with Patient: 30 (07/31/15 1000)   Met patient today during her chemotherapy treatment.  States she may be eligible for a clinical trial at [facility], and Dr. Mike Gip may be referring her there.  States she wants to try another option still nothing has really stopped the growth of her cancer.  Offered support.  She is to call if she has any questions or needs.

## 2015-08-02 ENCOUNTER — Other Ambulatory Visit: Payer: Self-pay | Admitting: Internal Medicine

## 2015-08-07 ENCOUNTER — Other Ambulatory Visit: Payer: Self-pay | Admitting: Hematology and Oncology

## 2015-08-07 ENCOUNTER — Inpatient Hospital Stay: Payer: PPO | Attending: Hematology and Oncology

## 2015-08-07 ENCOUNTER — Encounter: Payer: Self-pay | Admitting: Hematology and Oncology

## 2015-08-07 VITALS — BP 132/81 | HR 120 | Temp 97.0°F | Resp 18

## 2015-08-07 DIAGNOSIS — Z5112 Encounter for antineoplastic immunotherapy: Secondary | ICD-10-CM | POA: Insufficient documentation

## 2015-08-07 DIAGNOSIS — Z79899 Other long term (current) drug therapy: Secondary | ICD-10-CM | POA: Diagnosis not present

## 2015-08-07 DIAGNOSIS — C50911 Malignant neoplasm of unspecified site of right female breast: Secondary | ICD-10-CM

## 2015-08-07 DIAGNOSIS — E538 Deficiency of other specified B group vitamins: Secondary | ICD-10-CM | POA: Insufficient documentation

## 2015-08-07 MED ORDER — DIPHENHYDRAMINE HCL 25 MG PO CAPS
50.0000 mg | ORAL_CAPSULE | Freq: Once | ORAL | Status: AC
  Administered 2015-08-07: 50 mg via ORAL
  Filled 2015-08-07: qty 2

## 2015-08-07 MED ORDER — TRASTUZUMAB CHEMO INJECTION 440 MG
2.0000 mg/kg | Freq: Once | INTRAVENOUS | Status: AC
  Administered 2015-08-07: 105 mg via INTRAVENOUS
  Filled 2015-08-07: qty 5

## 2015-08-07 MED ORDER — ACETAMINOPHEN 325 MG PO TABS
650.0000 mg | ORAL_TABLET | Freq: Once | ORAL | Status: AC
  Administered 2015-08-07: 650 mg via ORAL
  Filled 2015-08-07: qty 2

## 2015-08-07 MED ORDER — SODIUM CHLORIDE 0.9 % IV SOLN
Freq: Once | INTRAVENOUS | Status: AC
  Administered 2015-08-07: 14:00:00 via INTRAVENOUS
  Filled 2015-08-07: qty 1000

## 2015-08-07 MED ORDER — HEPARIN SOD (PORK) LOCK FLUSH 100 UNIT/ML IV SOLN
500.0000 [IU] | Freq: Once | INTRAVENOUS | Status: AC | PRN
  Administered 2015-08-07: 500 [IU]
  Filled 2015-08-07: qty 5

## 2015-08-09 ENCOUNTER — Other Ambulatory Visit: Payer: Self-pay | Admitting: *Deleted

## 2015-08-09 DIAGNOSIS — C50911 Malignant neoplasm of unspecified site of right female breast: Secondary | ICD-10-CM

## 2015-08-09 MED ORDER — HYDROCODONE-ACETAMINOPHEN 5-325 MG PO TABS: 1.0000 | ORAL_TABLET | Freq: Four times a day (QID) | ORAL | Status: DC | PRN

## 2015-08-14 ENCOUNTER — Other Ambulatory Visit: Payer: Self-pay | Admitting: Hematology and Oncology

## 2015-08-14 ENCOUNTER — Inpatient Hospital Stay: Payer: PPO

## 2015-08-14 VITALS — BP 115/69 | HR 102 | Resp 18

## 2015-08-14 DIAGNOSIS — C50911 Malignant neoplasm of unspecified site of right female breast: Secondary | ICD-10-CM

## 2015-08-14 DIAGNOSIS — Z5112 Encounter for antineoplastic immunotherapy: Secondary | ICD-10-CM | POA: Diagnosis not present

## 2015-08-14 MED ORDER — TRASTUZUMAB CHEMO INJECTION 440 MG
2.0000 mg/kg | Freq: Once | INTRAVENOUS | Status: AC
  Administered 2015-08-14: 105 mg via INTRAVENOUS
  Filled 2015-08-14: qty 5

## 2015-08-14 MED ORDER — DIPHENHYDRAMINE HCL 25 MG PO CAPS
50.0000 mg | ORAL_CAPSULE | Freq: Once | ORAL | Status: AC
  Administered 2015-08-14: 50 mg via ORAL
  Filled 2015-08-14: qty 2

## 2015-08-14 MED ORDER — HEPARIN SOD (PORK) LOCK FLUSH 100 UNIT/ML IV SOLN
INTRAVENOUS | Status: AC
  Filled 2015-08-14: qty 5

## 2015-08-14 MED ORDER — HEPARIN SOD (PORK) LOCK FLUSH 100 UNIT/ML IV SOLN
500.0000 [IU] | Freq: Once | INTRAVENOUS | Status: AC
  Administered 2015-08-14: 500 [IU] via INTRAVENOUS

## 2015-08-14 MED ORDER — SODIUM CHLORIDE 0.9% FLUSH
10.0000 mL | INTRAVENOUS | Status: DC | PRN
  Filled 2015-08-14: qty 10

## 2015-08-14 MED ORDER — ACETAMINOPHEN 325 MG PO TABS
650.0000 mg | ORAL_TABLET | Freq: Once | ORAL | Status: AC
  Administered 2015-08-14: 650 mg via ORAL
  Filled 2015-08-14: qty 2

## 2015-08-14 MED ORDER — SODIUM CHLORIDE 0.9 % IV SOLN
Freq: Once | INTRAVENOUS | Status: AC
  Administered 2015-08-14: 14:00:00 via INTRAVENOUS
  Filled 2015-08-14: qty 1000

## 2015-08-15 ENCOUNTER — Other Ambulatory Visit: Payer: Self-pay

## 2015-08-15 MED ORDER — AMLODIPINE BESYLATE 5 MG PO TABS: 5.0000 mg | ORAL_TABLET | Freq: Every day | ORAL | Status: AC

## 2015-08-17 ENCOUNTER — Telehealth: Payer: Self-pay

## 2015-08-17 NOTE — Telephone Encounter (Signed)
Called and spoke with pt per MD.  Per MD pt should come in for Lab and Herceptin next Tuesday and again the following week for Lab/MD/herceptin on 5/2.  Pt verbalized and understanding.  No other concerns noted

## 2015-08-21 ENCOUNTER — Inpatient Hospital Stay: Payer: PPO

## 2015-08-21 ENCOUNTER — Telehealth: Payer: Self-pay | Admitting: *Deleted

## 2015-08-21 ENCOUNTER — Other Ambulatory Visit: Payer: Self-pay | Admitting: Hematology and Oncology

## 2015-08-21 ENCOUNTER — Other Ambulatory Visit: Payer: Self-pay | Admitting: *Deleted

## 2015-08-21 VITALS — BP 132/79 | HR 109 | Temp 97.0°F | Resp 18

## 2015-08-21 DIAGNOSIS — C50911 Malignant neoplasm of unspecified site of right female breast: Secondary | ICD-10-CM

## 2015-08-21 DIAGNOSIS — C50919 Malignant neoplasm of unspecified site of unspecified female breast: Secondary | ICD-10-CM | POA: Insufficient documentation

## 2015-08-21 DIAGNOSIS — E538 Deficiency of other specified B group vitamins: Secondary | ICD-10-CM

## 2015-08-21 DIAGNOSIS — Z5112 Encounter for antineoplastic immunotherapy: Secondary | ICD-10-CM | POA: Diagnosis not present

## 2015-08-21 LAB — POTASSIUM: Potassium: 4 mmol/L (ref 3.5–5.1)

## 2015-08-21 MED ORDER — HEPARIN SOD (PORK) LOCK FLUSH 100 UNIT/ML IV SOLN
500.0000 [IU] | Freq: Once | INTRAVENOUS | Status: AC | PRN
  Administered 2015-08-21: 500 [IU]
  Filled 2015-08-21 (×2): qty 5

## 2015-08-21 MED ORDER — TEMAZEPAM 15 MG PO CAPS: ORAL_CAPSULE | ORAL | Status: DC

## 2015-08-21 MED ORDER — SODIUM CHLORIDE 0.9% FLUSH
10.0000 mL | INTRAVENOUS | Status: DC | PRN
  Administered 2015-08-21: 10 mL
  Filled 2015-08-21: qty 10

## 2015-08-21 MED ORDER — SODIUM CHLORIDE 0.9 % IV SOLN
Freq: Once | INTRAVENOUS | Status: AC
  Administered 2015-08-21: 14:00:00 via INTRAVENOUS
  Filled 2015-08-21: qty 1000

## 2015-08-21 MED ORDER — HYDROCODONE-ACETAMINOPHEN 5-325 MG PO TABS: 1.0000 | ORAL_TABLET | Freq: Four times a day (QID) | ORAL | Status: DC | PRN

## 2015-08-21 MED ORDER — DIPHENHYDRAMINE HCL 25 MG PO CAPS
50.0000 mg | ORAL_CAPSULE | Freq: Once | ORAL | Status: AC
  Administered 2015-08-21: 50 mg via ORAL
  Filled 2015-08-21: qty 2

## 2015-08-21 MED ORDER — ACETAMINOPHEN 325 MG PO TABS
650.0000 mg | ORAL_TABLET | Freq: Once | ORAL | Status: AC
  Administered 2015-08-21: 650 mg via ORAL
  Filled 2015-08-21: qty 2

## 2015-08-21 MED ORDER — SODIUM CHLORIDE 0.9 % IV SOLN
2.0000 mg/kg | Freq: Once | INTRAVENOUS | Status: AC
  Administered 2015-08-21: 105 mg via INTRAVENOUS
  Filled 2015-08-21: qty 5

## 2015-08-21 MED ORDER — CYANOCOBALAMIN 1000 MCG/ML IJ SOLN
1000.0000 ug | Freq: Once | INTRAMUSCULAR | Status: AC
  Administered 2015-08-21: 1000 ug via INTRAMUSCULAR
  Filled 2015-08-21: qty 1

## 2015-08-21 NOTE — Telephone Encounter (Signed)
Needs refill of hydrocodone, temazepam , and handicap paper signed. All done and pt has rx

## 2015-08-21 NOTE — Telephone Encounter (Signed)
Pt wanted to know about whether she should cont. Potassium. Her level was 4.0 today.  I checked with corcoran and she said if she wants to come off med and we can check it next week when she comes it is ok or if pt wants to cont. It since it is low dose she can.  I called pt and she will take the rest which is a few pills that will run out way before next tues and we can recheck it then.  The labs for next have been entered and it includes potassium check. Pt agreeable to plan

## 2015-08-22 ENCOUNTER — Inpatient Hospital Stay: Payer: PPO

## 2015-08-27 ENCOUNTER — Telehealth: Payer: Self-pay | Admitting: *Deleted

## 2015-08-27 NOTE — Telephone Encounter (Signed)
Left message at pt's home to  Let her know that she still needs to come in for labs and see md but no treatment. Asked her call if questions

## 2015-08-28 ENCOUNTER — Other Ambulatory Visit: Payer: Self-pay | Admitting: Hematology and Oncology

## 2015-08-28 ENCOUNTER — Encounter: Payer: Self-pay | Admitting: Hematology and Oncology

## 2015-08-28 ENCOUNTER — Inpatient Hospital Stay: Payer: PPO | Attending: Hematology and Oncology

## 2015-08-28 ENCOUNTER — Inpatient Hospital Stay: Payer: PPO

## 2015-08-28 ENCOUNTER — Inpatient Hospital Stay (HOSPITAL_BASED_OUTPATIENT_CLINIC_OR_DEPARTMENT_OTHER): Payer: PPO | Admitting: Hematology and Oncology

## 2015-08-28 VITALS — BP 118/73 | HR 91 | Temp 96.5°F | Resp 17 | Ht 61.0 in | Wt 111.9 lb

## 2015-08-28 DIAGNOSIS — R350 Frequency of micturition: Secondary | ICD-10-CM | POA: Insufficient documentation

## 2015-08-28 DIAGNOSIS — E785 Hyperlipidemia, unspecified: Secondary | ICD-10-CM | POA: Diagnosis not present

## 2015-08-28 DIAGNOSIS — C50911 Malignant neoplasm of unspecified site of right female breast: Secondary | ICD-10-CM

## 2015-08-28 DIAGNOSIS — Z87442 Personal history of urinary calculi: Secondary | ICD-10-CM | POA: Insufficient documentation

## 2015-08-28 DIAGNOSIS — Z79899 Other long term (current) drug therapy: Secondary | ICD-10-CM | POA: Diagnosis not present

## 2015-08-28 DIAGNOSIS — Z9011 Acquired absence of right breast and nipple: Secondary | ICD-10-CM | POA: Insufficient documentation

## 2015-08-28 DIAGNOSIS — C779 Secondary and unspecified malignant neoplasm of lymph node, unspecified: Secondary | ICD-10-CM | POA: Diagnosis not present

## 2015-08-28 DIAGNOSIS — Z171 Estrogen receptor negative status [ER-]: Secondary | ICD-10-CM

## 2015-08-28 DIAGNOSIS — E876 Hypokalemia: Secondary | ICD-10-CM

## 2015-08-28 DIAGNOSIS — Z7984 Long term (current) use of oral hypoglycemic drugs: Secondary | ICD-10-CM | POA: Insufficient documentation

## 2015-08-28 DIAGNOSIS — R000 Tachycardia, unspecified: Secondary | ICD-10-CM | POA: Diagnosis not present

## 2015-08-28 DIAGNOSIS — IMO0001 Reserved for inherently not codable concepts without codable children: Secondary | ICD-10-CM

## 2015-08-28 DIAGNOSIS — E119 Type 2 diabetes mellitus without complications: Secondary | ICD-10-CM

## 2015-08-28 DIAGNOSIS — R531 Weakness: Secondary | ICD-10-CM | POA: Diagnosis not present

## 2015-08-28 DIAGNOSIS — Z923 Personal history of irradiation: Secondary | ICD-10-CM | POA: Insufficient documentation

## 2015-08-28 DIAGNOSIS — R630 Anorexia: Secondary | ICD-10-CM | POA: Diagnosis not present

## 2015-08-28 DIAGNOSIS — R21 Rash and other nonspecific skin eruption: Secondary | ICD-10-CM | POA: Insufficient documentation

## 2015-08-28 DIAGNOSIS — E538 Deficiency of other specified B group vitamins: Secondary | ICD-10-CM

## 2015-08-28 DIAGNOSIS — R197 Diarrhea, unspecified: Secondary | ICD-10-CM | POA: Insufficient documentation

## 2015-08-28 DIAGNOSIS — D509 Iron deficiency anemia, unspecified: Secondary | ICD-10-CM

## 2015-08-28 DIAGNOSIS — Z9221 Personal history of antineoplastic chemotherapy: Secondary | ICD-10-CM | POA: Diagnosis not present

## 2015-08-28 DIAGNOSIS — I10 Essential (primary) hypertension: Secondary | ICD-10-CM

## 2015-08-28 DIAGNOSIS — I2510 Atherosclerotic heart disease of native coronary artery without angina pectoris: Secondary | ICD-10-CM

## 2015-08-28 DIAGNOSIS — R5383 Other fatigue: Secondary | ICD-10-CM | POA: Insufficient documentation

## 2015-08-28 DIAGNOSIS — M129 Arthropathy, unspecified: Secondary | ICD-10-CM | POA: Insufficient documentation

## 2015-08-28 DIAGNOSIS — Z7982 Long term (current) use of aspirin: Secondary | ICD-10-CM

## 2015-08-28 LAB — BASIC METABOLIC PANEL
Anion gap: 8 (ref 5–15)
BUN: 17 mg/dL (ref 6–20)
CO2: 27 mmol/L (ref 22–32)
Calcium: 9.5 mg/dL (ref 8.9–10.3)
Chloride: 106 mmol/L (ref 101–111)
Creatinine, Ser: 0.53 mg/dL (ref 0.44–1.00)
GFR calc Af Amer: >60 mL/min (ref >60)
GFR calc non Af Amer: >60 mL/min (ref >60)
Glucose, Bld: 137 mg/dL — ABNORMAL HIGH (ref 65–99)
Potassium: 3.9 mmol/L (ref 3.5–5.1)
Sodium: 141 mmol/L (ref 135–145)

## 2015-08-28 LAB — CBC WITH DIFFERENTIAL/PLATELET
Basophils Absolute: 0 10*3/uL (ref 0–0.1)
Basophils Relative: 1 %
Eosinophils Absolute: 0.1 10*3/uL (ref 0–0.7)
Eosinophils Relative: 2 %
HCT: 27.5 % — ABNORMAL LOW (ref 35.0–47.0)
Hemoglobin: 8.9 g/dL — ABNORMAL LOW (ref 12.0–16.0)
Lymphocytes Relative: 19 %
Lymphs Abs: 1.1 10*3/uL (ref 1.0–3.6)
MCH: 29.9 pg (ref 26.0–34.0)
MCHC: 32.5 g/dL (ref 32.0–36.0)
MCV: 91.8 fL (ref 80.0–100.0)
Monocytes Absolute: 0.7 10*3/uL (ref 0.2–0.9)
Monocytes Relative: 12 %
Neutro Abs: 3.8 10*3/uL (ref 1.4–6.5)
Neutrophils Relative %: 66 %
Platelets: 239 10*3/uL (ref 150–440)
RBC: 2.99 MIL/uL — ABNORMAL LOW (ref 3.80–5.20)
RDW: 14.7 % — ABNORMAL HIGH (ref 11.5–14.5)
WBC: 5.8 10*3/uL (ref 3.6–11.0)

## 2015-08-28 MED ORDER — POTASSIUM CHLORIDE CRYS ER 10 MEQ PO TBCR: 10.0000 meq | EXTENDED_RELEASE_TABLET | Freq: Every day | ORAL | Status: DC

## 2015-08-28 NOTE — Progress Notes (Signed)
[facility] day:  08/28/2015   Chief Complaint: Dawn Wiley is an 81 y.o. female with stage IIIC right breast cancer who is seen for reassessment after 8 weeks of Herceptin prior to enrollment on clinical trial at [facility].   HPI: The patient was last seen in the medical oncology clinic on 07/31/2015.  At that time, she received week #5 Herceptin.  Disease was slowly extending outside of the radiation field.  We discussed potential protocol enrollment at [name] on the neratinib trial.  We discussed lapatinib + Xeloda if ineligible for trial.  We discussed continuation of Herceptin as it appeared to be slowing her rapidly advancing disease.  CA27.29 had decreased.  During the interim, she met with the [facility] physicians.  She is scheduled for punch biopsy, PET scan, bone scan, head MRI, EKG, and echo.  She is scheduled to start treatment on 09/07/2015. She notes protocol requirements of weekly visits initially then every 28 days.  She noted that her disease has continued to grow.  She notes 2-3 "light weak spells" when climbing up a hill.  She is bothered by her seat belt across her chest wall.  Past Medical History  Diagnosis Date  . Kidney stones   . Hyperlipidemia   . Coronary artery disease     Coronary calcifications noted on CT scan  . Diabetes mellitus without complication ([place])   . Nephrolithiasis   . Hypertension   . Arthritis   . Anemia   . Tachycardia     Dr. Fletcher Anon, cardiologist  . Cancer [facility])     breast (right)  . PONV (postoperative nausea and vomiting)   . Rash     back  . Breast cancer [facility])     Past Surgical History  Procedure Laterality Date  . Temporomandibular joint surgery    . Cataract extraction      bilateral  . Breast surgery      breast biopsy X 3  . Eye surgery      bilateral cataract extraction  . Mastectomy modified radical Right 08/31/2014    Procedure: MASTECTOMY MODIFIED RADICAL;  Surgeon: Molly Maduro, MD;  Location: [facility] ORS;  Service: General;  Laterality: Right;  . Portacath placement Left 10/20/2014    Procedure: INSERTION PORT-A-CATH;  Surgeon: Marlyce Huge, MD;  Location: [facility] ORS;  Service: General;  Laterality: Left;    Family History  Problem Relation Age of Onset  . Peripheral Artery Disease Sister     carotid artery stenosis   . Heart disease Sister   . Diabetes Sister     Social History:  reports that she has never smoked. She has never used smokeless tobacco. She reports that she does not drink alcohol or use illicit drugs.  The patient is alone today.  Her daughter's phone number is [phone].    Allergies: No Known Allergies  Current Medications: Current Outpatient Prescriptions  Medication Sig Dispense Refill  . acetaminophen (TYLENOL) 500 MG tablet Take 500 mg by mouth every 6 (six) hours as needed for mild pain or moderate pain.     [name] Kitchen amLODipine (NORVASC) 5 MG tablet Take 1 tablet (5 mg total) by mouth daily. 90 tablet 1  . aspirin 81 MG tablet Take 81 mg by mouth every other day.    . Calcium Carbonate (CALTRATE 600 PO) Take 1,200 mg by mouth daily.     . cholecalciferol (VITAMIN D) 400 UNITS TABS tablet Take 800 Units by mouth.    [name] Kitchen  Cyanocobalamin (VITAMIN B-12 IJ) Inject as directed every 30 (thirty) days.     . diphenhydrAMINE (BENADRYL) 25 mg capsule Take 25 mg by mouth every 6 (six) hours as needed for allergies.    . ferrous sulfate 325 (65 FE) MG tablet Take 325 mg by mouth daily with breakfast.     . glucose blood test strip Test blood sugars every morning and after meals DX E 11.19 100 each 12  . HYDROcodone-acetaminophen (NORCO/VICODIN) 5-325 MG tablet Take 1 tablet by mouth every 6 (six) hours as needed for moderate pain. 60 tablet 0  . Lancets (SAFETY LANCET 28G/PRESSURE ACT) MISC 1 each by Other route as needed (to test glucose levels). 50 each 12  . lidocaine-prilocaine (EMLA) cream Apply 1 application topically as needed. Apply  1-2 hours prior to chemotherapy 30 g 1  . loratadine (CLARITIN) 10 MG tablet Take 1 tablet (10 mg total) by mouth daily. 30 tablet 0  . magic mouthwash SOLN Take 5 mLs by mouth 4 (four) times daily. 200 mL 1  . metFORMIN (GLUCOPHAGE) 500 MG tablet Take 1 tablet (500 mg total) by mouth 2 (two) times daily with a meal. 180 tablet 1  . metFORMIN (GLUCOPHAGE) 500 MG tablet TAKE 1 TABLET (500 MG TOTAL) BY MOUTH 2 (TWO) TIMES DAILY WITH A MEAL. 180 tablet 1  . metoprolol tartrate (LOPRESSOR) 25 MG tablet Take 1 tablet (25 mg total) by mouth 2 (two) times daily. 180 tablet 2  . temazepam (RESTORIL) 15 MG capsule 1 tablet at night as needed for sleep 30 capsule 1  . potassium chloride (K-DUR,KLOR-CON) 10 MEQ tablet Take 1 tablet (10 mEq total) by mouth daily. (Patient not taking: Reported on 08/28/2015) 30 tablet 1   No current facility-administered medications for this visit.   Facility-Administered Medications Ordered in Other Visits  Medication Dose Route Frequency Provider Last Rate Last Dose  . sodium chloride 0.9 % injection 10 mL  10 mL Intravenous PRN [name], MD      . sodium chloride 0.9 % injection 10 mL  10 mL Intravenous PRN [name], MD   10 mL at 03/21/15 0849  . sodium chloride 0.9 % injection 10 mL  10 mL Intracatheter PRN [name], MD   10 mL at 04/25/15 1443    Review of Systems:  GENERAL: Feels "about the same". No fevers or sweats. Weight up 1 pound. PERFORMANCE STATUS (ECOG): 1 HEENT: No visual changes, runny nose, sore throat, or tenderness. Lungs: No shortness of breath or cough. No hemoptysis. Cardiac: No chest pain, palpitations, orthopnea, or PND. GI: No nausea, vomiting, diarrhea, constipation, melena or hematochezia. GU: No urgency, frequency, dysuria, or hematuria. Musculoskeletal: No back pain. No joint pain. No muscle tenderness. Extremities: No pain or swelling. Skin: Chest wall lesions, progressing both in and out of  prior radiation field. Neuro: No headache, numbness or weakness, balance or coordination issues. Endocrine: Diabetes.  No thyroid issues, hot flashes or night sweats. Psych: No mood changes, depression or anxiety.  trouble sleeping. Pain: No focal pain. Review of systems: All other systems reviewed and found to be negative.  Physical Exam: Blood pressure 118/73, pulse 91, temperature 96.5 F (35.8 C), temperature source Tympanic, resp. rate 17, height 5' 1"  (1.549 m), weight 111 lb 14.1 oz (50.75 kg). GENERAL: Thin elderly woman sitting comfortably in the exam room in no acute distress. MENTAL STATUS: Alert and oriented to person, place and time. HEAD: Short silver wig. Normocephalic, atraumatic, face  symmetric, no Cushingoid features. EYES: Blue eyes. Pupils equal round and reactive to light and accomodation.  No conjunctivitis or scleral icterus. RESPIRATORY:  Clear to auscultation without rales, wheezes or rhonchi. CARDIOVASCULAR:  Regular rate and rhythm without murmur, rub or gallop. BREAST: Right mastectomy. Nodular right chest wall medial lesion measuring 4 x 5.5 cm.  Medial to lateral skin changes 42 cm x 21 cm cranial to caudal extension.  Nodular lesion on right lateral back 10 x 15 cm.  ABDOMEN:  Soft, non-tender, with active bowel sounds, and no hepatosplenomegaly.  No masses. SKIN: Chest wall lesions as above.  Upper right arm 7.5 x 5 cm nodular lesion with purple hue. EXTREMITIES: No edema, no skin discoloration or tenderness. No palpable cords. LYMPH NODES: No palpable axillary adenopathy  NEUROLOGICAL: Unremarkable. PSYCH: Appropriate.   Appointment on 08/28/2015  Component Date Value Ref Range Status  . WBC 08/28/2015 5.8  3.6 - 11.0 K/uL Final  . RBC 08/28/2015 2.99* 3.80 - 5.20 MIL/uL Final  . Hemoglobin 08/28/2015 8.9* 12.0 - 16.0 g/dL Final  . HCT 08/28/2015 27.5* 35.0 - 47.0 % Final  . MCV 08/28/2015 91.8  80.0 - 100.0 fL Final  . MCH 08/28/2015  29.9  26.0 - 34.0 pg Final  . MCHC 08/28/2015 32.5  32.0 - 36.0 g/dL Final  . RDW 08/28/2015 14.7* 11.5 - 14.5 % Final  . Platelets 08/28/2015 239  150 - 440 K/uL Final  . Neutrophils Relative % 08/28/2015 66   Final  . Neutro Abs 08/28/2015 3.8  1.4 - 6.5 K/uL Final  . Lymphocytes Relative 08/28/2015 19   Final  . Lymphs Abs 08/28/2015 1.1  1.0 - 3.6 K/uL Final  . Monocytes Relative 08/28/2015 12   Final  . Monocytes Absolute 08/28/2015 0.7  0.2 - 0.9 K/uL Final  . Eosinophils Relative 08/28/2015 2   Final  . Eosinophils Absolute 08/28/2015 0.1  0 - 0.7 K/uL Final  . Basophils Relative 08/28/2015 1   Final  . Basophils Absolute 08/28/2015 0.0  0 - 0.1 K/uL Final  . Sodium 08/28/2015 141  135 - 145 mmol/L Final  . Potassium 08/28/2015 3.9  3.5 - 5.1 mmol/L Final  . Chloride 08/28/2015 106  101 - 111 mmol/L Final  . CO2 08/28/2015 27  22 - 32 mmol/L Final  . Glucose, Bld 08/28/2015 137* 65 - 99 mg/dL Final  . BUN 08/28/2015 17  6 - 20 mg/dL Final  . Creatinine, Ser 08/28/2015 0.53  0.44 - 1.00 mg/dL Final  . Calcium 08/28/2015 9.5  8.9 - 10.3 mg/dL Final  . GFR calc non Af Amer 08/28/2015 >60  >60 mL/min Final  . GFR calc Af Amer 08/28/2015 >60  >60 mL/min Final   Comment: (NOTE) The eGFR has been calculated using the CKD EPI equation. This calculation has not been validated in all clinical situations. eGFR's persistently <60 mL/min signify possible Chronic Kidney Disease.   . Anion gap 08/28/2015 8  5 - 15 Final    Assessment:  SAVEAH BAHAR is an 81 y.o. female with stage IIIC right breast cancer status post mastectomy and axillary lymph node dissection on 08/31/2014. 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 Carcinoma involved skeletal muscle and dermal lymphatics. Tumor was less than 0.5 mm from the deep margin. Thirteen of 15 lymph nodes were involved. The largest metastatic focus was 2 cm. Tumor is triple  negative (ER negative, PR negative, and HER-2/neu negative).  Androgen receptor and  PDL-1 testing was negative.  Pathologic stage was T3N3aMx.  PET scan on 09/21/2014 revealed postoperative changes in the right chest with no suspicious findings or residual tumor. Bone scan on 10/11/2014 revealed no evidence of metastatic disease.  Bone density study on 10/02/2014 revealed a T score of -4.7 in the forearm consistent with osteoporosis. T-score was less than 2.5 in the spine or hip. Echo on 10/02/2014 revealed ejection fraction of 60-65%.  Echo on 06/27/2015 revealed EF of 55-60%.  She has iron deficiency anemia. Labs on 09/19/2014 revealed a hematocrit of 29.9, hemoglobin 8.7, MCV 69.7, ferritin 9, and TIBC 616. B12 was low (265) with a prior history of B12 deficiency and need for supplementation (stopped 06/2014). She began B12 on 10/12/2014 (last 08/21/2015).  Folate was normal.  Her diet is modest. She has never had a colonoscopy. She denies any melena or hematochezia.  She takes 1 iron pill a day (additional iron causes diarrhea).  Ferritin was 116 on 04/25/2015.  Chest wall biopsy x 3 (lateral, medial, and middle sites) on 11/08/2014 revealed dermal lymphatic involvement by invasive mammary carcinoma with micropapillary features.  Right arm punch biopsy on 06/28/2015 confirmed breast cancer.  She has aggressive disease with growth despite several chemotherapeutic agents.  She received 5 weeks of Taxol (11/03/2014 - 12/01/2014).  She received 1 week of adriamycin (12/14/2014).  She received 1 cycle of carboplatin (12/28/2014).  After carboplatin, disease appeared to stabilize, but with 2 weeks off of therapy, her disease grew again (medial lesion larger and weaping).   She received 3 cycles of carboplatin and Taxol (01/18/2015 - 03/02/2015).  Chest wall lesions initially decreased then began to grow.  She received 2 cycles of adriamycin and Cytoxan (03/26/2015 - 04/11/2015) with Neulasta support.   ANC nadir was 200 on day 9 (04/03/2015).  Cycle #2 was complicated by mouth sores, decreased oral intake, and weight loss. Chest wall disease began to grow before cycle #3 could be administered.  PET scan on 04/26/2015 revealed extensive recurrence about the right chest.  The far lateral component measured 4.2 x 2.2 cm (S.U.V.13.5).  A portion superficial to the right-side of the sternum measured 5.2 x 2.2 cm (S.U.V. 9.1).  There was diffuse more inferior and lateral chest wall soft tissue thickening and hypermetabolism.    CA27.29 was 118.4 on 05/02/2015, 92.9 on 06/11/2015, 38.5 on 07/03/2015, and 27.4 on 07/24/2015.  She received 5 weeks of concurrent carboplatin and radiation (05/14/2015 - 06/11/2015).  Radiation completed on 06/15/2015.  Cutaneous lesions in the field of radiation improved dramatically.    [facility] testing on 05/22/2015 returned genomic alterations of ERBB2 (S310F and V777L), FGFR1 (amplification), CDH1 (E490*), CDKN2A (p16INK4a H83Y and p14ARF A97V), MYST3 (amplification), TERT (promotor -124C>T), TP53  (Y220C), and ZNF703 (amplification).  FDA approved therapies included ado-trastuzumab, lapatinib, pertuzumab, trastuzumab for ERBB2 as well as afatinib (approved in other tumor type) and pazopanib and ponatinib (approved in other tumor types for FGFR1).  She has received 8 weeks of Herceptin (07/03/2015 - 08/21/2015).  She has enrolled on the [facility] neratinib trial.  Symptomatically, she is fatigued at times.  Her disease is growing in and outside of the radiation field.  She has a progressive normocytic anemia.  Potassium is normal on supplementation.  Plan: 1.  Labs today:  CBC with diff, BMP. 2.  Discuss protocol enrollment at [facility] on neratinib trial. 3.  Discuss slowly progressive anemia. 4.  Continue B12 monthly (next due 09/18/2015). 5.  Continue potassium 10 meq a day.  6.  RTC on 09/03/2015 for labs (CBC with diff, ferritin, iron studies, folate, hold tube). 7.   RTC in 1 month for MD assessment.   [name], MD  08/28/2015, 10:19 AM

## 2015-08-28 NOTE — Progress Notes (Signed)
Pt reported some really really mild weak spell when walking to mailbox.  Pt reports that at [place] her hgb was lower and believes it could be related to that

## 2015-09-03 ENCOUNTER — Telehealth: Payer: Self-pay

## 2015-09-03 ENCOUNTER — Inpatient Hospital Stay: Payer: PPO

## 2015-09-03 DIAGNOSIS — C50911 Malignant neoplasm of unspecified site of right female breast: Secondary | ICD-10-CM

## 2015-09-03 DIAGNOSIS — D509 Iron deficiency anemia, unspecified: Secondary | ICD-10-CM

## 2015-09-03 DIAGNOSIS — E538 Deficiency of other specified B group vitamins: Secondary | ICD-10-CM

## 2015-09-03 LAB — SAMPLE TO BLOOD BANK

## 2015-09-03 LAB — CBC WITH DIFFERENTIAL/PLATELET
Basophils Absolute: 0 10*3/uL (ref 0–0.1)
Basophils Relative: 1 %
Eosinophils Absolute: 0.1 10*3/uL (ref 0–0.7)
Eosinophils Relative: 1 %
HCT: 26.3 % — ABNORMAL LOW (ref 35.0–47.0)
Hemoglobin: 8.6 g/dL — ABNORMAL LOW (ref 12.0–16.0)
Lymphocytes Relative: 17 %
Lymphs Abs: 0.9 10*3/uL — ABNORMAL LOW (ref 1.0–3.6)
MCH: 29.3 pg (ref 26.0–34.0)
MCHC: 32.6 g/dL (ref 32.0–36.0)
MCV: 89.9 fL (ref 80.0–100.0)
Monocytes Absolute: 0.5 10*3/uL (ref 0.2–0.9)
Monocytes Relative: 10 %
Neutro Abs: 3.7 10*3/uL (ref 1.4–6.5)
Neutrophils Relative %: 71 %
Platelets: 240 10*3/uL (ref 150–440)
RBC: 2.93 MIL/uL — ABNORMAL LOW (ref 3.80–5.20)
RDW: 15.2 % — ABNORMAL HIGH (ref 11.5–14.5)
WBC: 5.2 10*3/uL (ref 3.6–11.0)

## 2015-09-03 LAB — POTASSIUM: Potassium: 4 mmol/L (ref 3.5–5.1)

## 2015-09-03 LAB — IRON AND TIBC
Iron: 148 ug/dL (ref 28–170)
Saturation Ratios: 32 % — ABNORMAL HIGH (ref 10.4–31.8)
TIBC: 465 ug/dL — ABNORMAL HIGH (ref 250–450)
UIBC: 317 ug/dL

## 2015-09-03 LAB — FOLATE: Folate: 27 ng/mL (ref >5.9)

## 2015-09-03 LAB — FERRITIN: Ferritin: 18 ng/mL (ref 11–307)

## 2015-09-03 NOTE — Telephone Encounter (Signed)
Called pt regarding IDA.  Per MD asked if she would like IV venofer.  Pt reported that if MD thought she could benefit she would.  Per pt she is still taking one Iron Pill daily.  Pt reported that she would like know if she can increase her pain medication.  I verbalized I will talk with doctor but due to time it may take until tomorrow for an answer.  Pt verbalized that was ok.  Will inform MD.

## 2015-09-04 ENCOUNTER — Other Ambulatory Visit: Payer: Self-pay

## 2015-09-04 ENCOUNTER — Ambulatory Visit: Payer: PPO

## 2015-09-04 ENCOUNTER — Other Ambulatory Visit: Payer: PPO

## 2015-09-04 ENCOUNTER — Ambulatory Visit: Payer: PPO | Admitting: Hematology and Oncology

## 2015-09-04 ENCOUNTER — Telehealth: Payer: Self-pay

## 2015-09-04 DIAGNOSIS — C50911 Malignant neoplasm of unspecified site of right female breast: Secondary | ICD-10-CM

## 2015-09-04 MED ORDER — HYDROCODONE-ACETAMINOPHEN 7.5-325 MG PO TABS: 1.0000 | ORAL_TABLET | Freq: Four times a day (QID) | ORAL | Status: DC | PRN

## 2015-09-04 NOTE — Telephone Encounter (Signed)
Returned pts phone and per pt she would like to do venofer because the new medication is will receive for her breast cancer can cause diarrhea and so does her Iron.  Per MD we will preauth pt for venofer.  Informed pt that we had a stronger pain medication for her.  She stated that she would pick it up tomorrow.  No other concerns noted.

## 2015-09-18 ENCOUNTER — Inpatient Hospital Stay (HOSPITAL_BASED_OUTPATIENT_CLINIC_OR_DEPARTMENT_OTHER): Payer: PPO | Admitting: Hematology and Oncology

## 2015-09-18 ENCOUNTER — Ambulatory Visit
Admission: RE | Admit: 2015-09-18 | Discharge: 2015-09-18 | Disposition: A | Discharge status: Home / Self Care | Payer: PPO | Source: Ambulatory Visit | Attending: Hematology and Oncology | Admitting: Hematology and Oncology

## 2015-09-18 ENCOUNTER — Inpatient Hospital Stay: Payer: PPO

## 2015-09-18 ENCOUNTER — Other Ambulatory Visit: Payer: Self-pay | Admitting: Hematology and Oncology

## 2015-09-18 ENCOUNTER — Other Ambulatory Visit: Payer: Self-pay | Admitting: *Deleted

## 2015-09-18 ENCOUNTER — Telehealth: Payer: Self-pay | Admitting: *Deleted

## 2015-09-18 VITALS — BP 108/68 | HR 91 | Temp 96.7°F | Resp 18 | Ht 61.0 in | Wt 108.8 lb

## 2015-09-18 DIAGNOSIS — R509 Fever, unspecified: Secondary | ICD-10-CM

## 2015-09-18 DIAGNOSIS — C50911 Malignant neoplasm of unspecified site of right female breast: Secondary | ICD-10-CM

## 2015-09-18 DIAGNOSIS — E785 Hyperlipidemia, unspecified: Secondary | ICD-10-CM

## 2015-09-18 DIAGNOSIS — R350 Frequency of micturition: Secondary | ICD-10-CM

## 2015-09-18 DIAGNOSIS — Z87442 Personal history of urinary calculi: Secondary | ICD-10-CM

## 2015-09-18 DIAGNOSIS — D649 Anemia, unspecified: Secondary | ICD-10-CM

## 2015-09-18 DIAGNOSIS — I2510 Atherosclerotic heart disease of native coronary artery without angina pectoris: Secondary | ICD-10-CM

## 2015-09-18 DIAGNOSIS — D509 Iron deficiency anemia, unspecified: Secondary | ICD-10-CM

## 2015-09-18 DIAGNOSIS — Z7984 Long term (current) use of oral hypoglycemic drugs: Secondary | ICD-10-CM

## 2015-09-18 DIAGNOSIS — R197 Diarrhea, unspecified: Secondary | ICD-10-CM | POA: Diagnosis not present

## 2015-09-18 DIAGNOSIS — I10 Essential (primary) hypertension: Secondary | ICD-10-CM

## 2015-09-18 DIAGNOSIS — Z171 Estrogen receptor negative status [ER-]: Secondary | ICD-10-CM | POA: Diagnosis not present

## 2015-09-18 DIAGNOSIS — R000 Tachycardia, unspecified: Secondary | ICD-10-CM

## 2015-09-18 DIAGNOSIS — D6489 Other specified anemias: Secondary | ICD-10-CM

## 2015-09-18 DIAGNOSIS — E876 Hypokalemia: Secondary | ICD-10-CM

## 2015-09-18 DIAGNOSIS — C779 Secondary and unspecified malignant neoplasm of lymph node, unspecified: Secondary | ICD-10-CM | POA: Diagnosis not present

## 2015-09-18 DIAGNOSIS — Z95828 Presence of other vascular implants and grafts: Secondary | ICD-10-CM

## 2015-09-18 DIAGNOSIS — Z7982 Long term (current) use of aspirin: Secondary | ICD-10-CM

## 2015-09-18 DIAGNOSIS — R634 Abnormal weight loss: Secondary | ICD-10-CM

## 2015-09-18 DIAGNOSIS — R630 Anorexia: Secondary | ICD-10-CM

## 2015-09-18 DIAGNOSIS — Z9221 Personal history of antineoplastic chemotherapy: Secondary | ICD-10-CM

## 2015-09-18 DIAGNOSIS — R5383 Other fatigue: Secondary | ICD-10-CM

## 2015-09-18 DIAGNOSIS — IMO0001 Reserved for inherently not codable concepts without codable children: Secondary | ICD-10-CM

## 2015-09-18 DIAGNOSIS — M129 Arthropathy, unspecified: Secondary | ICD-10-CM

## 2015-09-18 DIAGNOSIS — Z9011 Acquired absence of right breast and nipple: Secondary | ICD-10-CM

## 2015-09-18 DIAGNOSIS — R21 Rash and other nonspecific skin eruption: Secondary | ICD-10-CM

## 2015-09-18 DIAGNOSIS — E119 Type 2 diabetes mellitus without complications: Secondary | ICD-10-CM

## 2015-09-18 DIAGNOSIS — E538 Deficiency of other specified B group vitamins: Secondary | ICD-10-CM

## 2015-09-18 DIAGNOSIS — Z923 Personal history of irradiation: Secondary | ICD-10-CM

## 2015-09-18 DIAGNOSIS — Z79899 Other long term (current) drug therapy: Secondary | ICD-10-CM

## 2015-09-18 DIAGNOSIS — R531 Weakness: Secondary | ICD-10-CM

## 2015-09-18 LAB — SAMPLE TO BLOOD BANK

## 2015-09-18 LAB — BASIC METABOLIC PANEL
Anion gap: 10 (ref 5–15)
BUN: 11 mg/dL (ref 6–20)
CO2: 24 mmol/L (ref 22–32)
Calcium: 8.9 mg/dL (ref 8.9–10.3)
Chloride: 102 mmol/L (ref 101–111)
Creatinine, Ser: 0.49 mg/dL (ref 0.44–1.00)
GFR calc Af Amer: >60 mL/min (ref >60)
GFR calc non Af Amer: >60 mL/min (ref >60)
Glucose, Bld: 150 mg/dL — ABNORMAL HIGH (ref 65–99)
Potassium: 3.3 mmol/L — ABNORMAL LOW (ref 3.5–5.1)
Sodium: 136 mmol/L (ref 135–145)

## 2015-09-18 LAB — URINALYSIS COMPLETE WITH MICROSCOPIC (ARMC ONLY)
Bilirubin Urine: NEGATIVE
Glucose, UA: NEGATIVE mg/dL
Hgb urine dipstick: NEGATIVE
Nitrite: NEGATIVE
Protein, ur: 30 mg/dL — AB
Specific Gravity, Urine: 1.021 (ref 1.005–1.030)
Squamous Epithelial / LPF: NONE SEEN
pH: 5 (ref 5.0–8.0)

## 2015-09-18 LAB — CBC WITH DIFFERENTIAL/PLATELET
Basophils Absolute: 0 10*3/uL (ref 0–0.1)
Basophils Relative: 1 %
Eosinophils Absolute: 0.1 10*3/uL (ref 0–0.7)
Eosinophils Relative: 1 %
HCT: 27.6 % — ABNORMAL LOW (ref 35.0–47.0)
Hemoglobin: 8.8 g/dL — ABNORMAL LOW (ref 12.0–16.0)
Lymphocytes Relative: 9 %
Lymphs Abs: 0.6 10*3/uL — ABNORMAL LOW (ref 1.0–3.6)
MCH: 27.4 pg (ref 26.0–34.0)
MCHC: 31.7 g/dL — ABNORMAL LOW (ref 32.0–36.0)
MCV: 86.4 fL (ref 80.0–100.0)
Monocytes Absolute: 0.5 10*3/uL (ref 0.2–0.9)
Monocytes Relative: 9 %
Neutro Abs: 4.9 10*3/uL (ref 1.4–6.5)
Neutrophils Relative %: 80 %
Platelets: 195 10*3/uL (ref 150–440)
RBC: 3.2 MIL/uL — ABNORMAL LOW (ref 3.80–5.20)
RDW: 15.2 % — ABNORMAL HIGH (ref 11.5–14.5)
WBC: 6.1 10*3/uL (ref 3.6–11.0)

## 2015-09-18 LAB — FERRITIN: Ferritin: 21 ng/mL (ref 11–307)

## 2015-09-18 MED ORDER — HEPARIN SOD (PORK) LOCK FLUSH 100 UNIT/ML IV SOLN
500.0000 [IU] | Freq: Once | INTRAVENOUS | Status: AC
  Administered 2015-09-18: 500 [IU] via INTRAVENOUS

## 2015-09-18 MED ORDER — SODIUM CHLORIDE 0.9% FLUSH
10.0000 mL | INTRAVENOUS | Status: AC | PRN
  Administered 2015-09-18: 10 mL via INTRAVENOUS
  Filled 2015-09-18: qty 10

## 2015-09-18 NOTE — Progress Notes (Signed)
Pt and daughter reports last night pt having a fever of a 101.7 no fever today.  Pt also reports being nauseated yesterday and did NOT take Zofran because of being unsure if she could with trial medication with [facility].  Pts daughter reports that as of last Friday pt's feet were swelling and [facility] said that  The swelling in her feet would level out and to wear TED hose and not cross legs.  Pt got home and did not Have the TED hose so unable to do TED hose. Pt's daughter reports pt is not eating well at all or drinking and pt has lost weight.  Pt reports having bad dreams last night and not being able to sleep well.  Pt's daughter states her rmother is always resting now or wanting to lay down.  Pt reports she is taking iron once daily and cannot do twice daily.  Pt reports multiple bouts of diarrhea a day and has medication.  Pt reporting everything tasting all the same again.  Pt currently eating ICE says it goes down easier than water.

## 2015-09-18 NOTE — Progress Notes (Signed)
[place] [facility] day:  09/18/2015   Chief Complaint: Dawn Wiley is an 81 y.o. female with stage IIIC right breast cancer who is seen for reassessment after 8 weeks of Herceptin prior to enrollment on clinical trial at [facility].   HPI: The patient was last seen in the medical oncology clinic on 08/28/2015.  At that time, chest wall disease was progressing on Herceptin.  She had met with Dr. Beverley Fiedler at [facility] about the Neratinib clinical trial.  She was scheduled for testing.  She started neratinib and Faslodex on 09/07/2015.  She has had a dramatic response to therapy.  She has had diarrhea since starting neratinib.  She can take Imodium.  Her oral intake has been poor.  She is drinking Pedialyte and Boost.  She has had some urinary frequency.    Last night, she had a temperature of 101.7.  Temperature was 97.1 this morning.  She denied any chills, shortness of breath or cough.  She notes her hemoglobin was down at [facility].  She may require a transfusion.  She is fatigued.  She notes that her port is not working.  The [facility] clinic tried TPA last week, but were unsuccessful.   Past Medical History  Diagnosis Date  . Kidney stones   . Hyperlipidemia   . Coronary artery disease     Coronary calcifications noted on CT scan  . Diabetes mellitus without complication ([place])   . Nephrolithiasis   . Hypertension   . Arthritis   . Anemia   . Tachycardia     Dr. Fletcher Anon, cardiologist  . Cancer [facility])     breast (right)  . PONV (postoperative nausea and vomiting)   . Rash     back  . Breast cancer [facility])     Past Surgical History  Procedure Laterality Date  . Temporomandibular joint surgery    . Cataract extraction      bilateral  . Breast surgery      breast biopsy X 3  . Eye surgery      bilateral cataract extraction  . Mastectomy modified radical Right 08/31/2014    Procedure: MASTECTOMY MODIFIED RADICAL;  Surgeon: Molly Maduro, MD;  Location: [facility]  ORS;  Service: General;  Laterality: Right;  . Portacath placement Left 10/20/2014    Procedure: INSERTION PORT-A-CATH;  Surgeon: Marlyce Huge, MD;  Location: [facility] ORS;  Service: General;  Laterality: Left;    Family History  Problem Relation Age of Onset  . Peripheral Artery Disease Sister     carotid artery stenosis   . Heart disease Sister   . Diabetes Sister     Social History:  reports that she has never smoked. She has never used smokeless tobacco. She reports that she does not drink alcohol or use illicit drugs.  The patient is accompanied by her daughter today.  Her daughter's phone number is [phone].    Allergies: No Known Allergies  Current Medications: Current Outpatient Prescriptions  Medication Sig Dispense Refill  . acetaminophen (TYLENOL) 500 MG tablet Take 500 mg by mouth every 6 (six) hours as needed for mild pain or moderate pain.     [name] Kitchen amLODipine (NORVASC) 5 MG tablet Take 1 tablet (5 mg total) by mouth daily. 90 tablet 1  . aspirin 81 MG tablet Take 81 mg by mouth every other day.    . Calcium Carbonate (CALTRATE 600 PO) Take 1,200 mg by mouth daily.     . cholecalciferol (VITAMIN  D) 400 UNITS TABS tablet Take 800 Units by mouth.    . Cyanocobalamin (VITAMIN B-12 IJ) Inject as directed every 30 (thirty) days.     . diphenhydrAMINE (BENADRYL) 25 mg capsule Take 25 mg by mouth every 6 (six) hours as needed for allergies.    . ferrous sulfate 325 (65 FE) MG tablet Take 325 mg by mouth daily with breakfast.     . glucose blood test strip Test blood sugars every morning and after meals DX E 11.19 100 each 12  . HYDROcodone-acetaminophen (NORCO) 7.5-325 MG tablet Take 1 tablet by mouth every 6 (six) hours as needed for moderate pain. 60 tablet 0  . Lancets (SAFETY LANCET 28G/PRESSURE ACT) MISC 1 each by Other route as needed (to test glucose levels). 50 each 12  . lidocaine-prilocaine (EMLA) cream Apply 1 application topically as needed. Apply 1-2 hours prior  to chemotherapy 30 g 1  . loperamide (IMODIUM) 2 MG capsule     . loratadine (CLARITIN) 10 MG tablet Take 1 tablet (10 mg total) by mouth daily. 30 tablet 0  . metFORMIN (GLUCOPHAGE) 500 MG tablet Take 1 tablet (500 mg total) by mouth 2 (two) times daily with a meal. 180 tablet 1  . metFORMIN (GLUCOPHAGE) 500 MG tablet TAKE 1 TABLET (500 MG TOTAL) BY MOUTH 2 (TWO) TIMES DAILY WITH A MEAL. 180 tablet 1  . metoprolol tartrate (LOPRESSOR) 25 MG tablet Take 1 tablet (25 mg total) by mouth 2 (two) times daily. 180 tablet 2  . potassium chloride (K-DUR,KLOR-CON) 10 MEQ tablet Take 1 tablet (10 mEq total) by mouth daily. 30 tablet 2  . temazepam (RESTORIL) 15 MG capsule 1 tablet at night as needed for sleep 30 capsule 1  . magic mouthwash SOLN Take 5 mLs by mouth 4 (four) times daily. (Patient not taking: Reported on 09/18/2015) 200 mL 1   No current facility-administered medications for this visit.   Facility-Administered Medications Ordered in Other Visits  Medication Dose Route Frequency Provider Last Rate Last Dose  . sodium chloride 0.9 % injection 10 mL  10 mL Intravenous PRN [name], MD      . sodium chloride 0.9 % injection 10 mL  10 mL Intravenous PRN [name], MD   10 mL at 03/21/15 0849  . sodium chloride 0.9 % injection 10 mL  10 mL Intracatheter PRN [name], MD   10 mL at 04/25/15 5035    Review of Systems:  GENERAL: Feels fatigued. No fevers or sweats. Weight down 3 pounds. PERFORMANCE STATUS (ECOG): 1 HEENT: No visual changes, runny nose, sore throat, or tenderness. Lungs: No shortness of breath or cough. No hemoptysis. Cardiac: No chest pain, palpitations, orthopnea, or PND. GI: Diarrhea.  No nausea, vomiting, constipation, melena or hematochezia. GU: Urinary frequency.  No urgency, dysuria, or hematuria. Musculoskeletal: No back pain. No joint pain. No muscle tenderness. Extremities: No pain or swelling. Skin: Chest wall lesions,  improving. Neuro: No headache, numbness or weakness, balance or coordination issues. Endocrine: Diabetes.  No thyroid issues, hot flashes or night sweats. Psych: No mood changes, depression or anxiety.  trouble sleeping. Pain: No focal pain. Review of systems: All other systems reviewed and found to be negative.  Physical Exam: Blood pressure 108/68, pulse 91, temperature 96.7 F (35.9 C), temperature source Tympanic, resp. rate 18, height 5' 1"  (1.549 m), weight 108 lb 12.8 oz (49.35 kg). GENERAL: Thin fatigued appearing elderly woman sitting comfortably in the exam room in no  acute distress. MENTAL STATUS: Alert and oriented to person, place and time. HEAD: Short silver wig. Normocephalic, atraumatic, face symmetric, no Cushingoid features. EYES: Blue eyes. Pupils equal round and reactive to light and accomodation.  No conjunctivitis or scleral icterus. RESPIRATORY:  Clear to auscultation without rales, wheezes or rhonchi. CARDIOVASCULAR:  Regular rate and rhythm without murmur, rub or gallop. BREAST: Right mastectomy. Flattening pale pink right chest wall medial lesion measuring 3 x 5.5 cm. Flattening pale pink lesion on right lateral back 10 x 12 cm.  No new lesions. ABDOMEN:  Soft, non-tender, with active bowel sounds, and no hepatosplenomegaly.  No masses. SKIN: Chest wall lesions as above.  Upper right arm 6 x 3 cm flattening and pale pink (improved). EXTREMITIES: No edema, no skin discoloration or tenderness. No palpable cords. LYMPH NODES: No palpable axillary adenopathy  NEUROLOGICAL: Unremarkable. PSYCH: Appropriate.   Clinical Support on 09/18/2015  Component Date Value Ref Range Status  . WBC 09/18/2015 6.1  3.6 - 11.0 K/uL Final  . RBC 09/18/2015 3.20* 3.80 - 5.20 MIL/uL Final  . Hemoglobin 09/18/2015 8.8* 12.0 - 16.0 g/dL Final  . HCT 09/18/2015 27.6* 35.0 - 47.0 % Final  . MCV 09/18/2015 86.4  80.0 - 100.0 fL Final  . MCH 09/18/2015 27.4  26.0 - 34.0 pg  Final  . MCHC 09/18/2015 31.7* 32.0 - 36.0 g/dL Final  . RDW 09/18/2015 15.2* 11.5 - 14.5 % Final  . Platelets 09/18/2015 195  150 - 440 K/uL Final  . Neutrophils Relative % 09/18/2015 80   Final  . Neutro Abs 09/18/2015 4.9  1.4 - 6.5 K/uL Final  . Lymphocytes Relative 09/18/2015 9   Final  . Lymphs Abs 09/18/2015 0.6* 1.0 - 3.6 K/uL Final  . Monocytes Relative 09/18/2015 9   Final  . Monocytes Absolute 09/18/2015 0.5  0.2 - 0.9 K/uL Final  . Eosinophils Relative 09/18/2015 1   Final  . Eosinophils Absolute 09/18/2015 0.1  0 - 0.7 K/uL Final  . Basophils Relative 09/18/2015 1   Final  . Basophils Absolute 09/18/2015 0.0  0 - 0.1 K/uL Final  . Ferritin 09/18/2015 21  11 - 307 ng/mL Final    Assessment:  Dawn Wiley is an 81 y.o. female with stage IIIC right breast cancer status post mastectomy and axillary lymph node dissection on 08/31/2014. 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 Carcinoma involved skeletal muscle and dermal lymphatics. Tumor was less than 0.5 mm from the deep margin. Thirteen of 15 lymph nodes were involved. The largest metastatic focus was 2 cm. Tumor is triple negative (ER negative, PR negative, and HER-2/neu negative).  Androgen receptor and PDL-1 testing was negative.  Pathologic stage was T3N3aMx.  PET scan on 09/21/2014 revealed postoperative changes in the right chest with no suspicious findings or residual tumor. Bone scan on 10/11/2014 revealed no evidence of metastatic disease.  Bone density study on 10/02/2014 revealed a T score of -4.7 in the forearm consistent with osteoporosis. T-score was less than 2.5 in the spine or hip. Echo on 10/02/2014 revealed ejection fraction of 60-65%.  Echo on 06/27/2015 revealed EF of 55-60%.  She has iron deficiency anemia. Labs on 09/19/2014 revealed a hematocrit of 29.9, hemoglobin 8.7, MCV 69.7, ferritin 9, and TIBC 616. B12 was low (265) with a prior  history of B12 deficiency and need for supplementation (stopped 06/2014). She began B12 on 10/12/2014 (last 08/21/2015).  Folate was normal.  Her diet is modest. She  has never had a colonoscopy. She denies any melena or hematochezia.  She takes 1 iron pill a day (additional iron causes diarrhea).  Ferritin was 116 on 04/25/2015.  Chest wall biopsy x 3 (lateral, medial, and middle sites) on 11/08/2014 revealed dermal lymphatic involvement by invasive mammary carcinoma with micropapillary features.  Right arm punch biopsy on 06/28/2015 confirmed breast cancer.  She has aggressive disease with growth despite several chemotherapeutic agents.  She received 5 weeks of Taxol (11/03/2014 - 12/01/2014).  She received 1 week of adriamycin (12/14/2014).  She received 1 cycle of carboplatin (12/28/2014).  After carboplatin, disease appeared to stabilize, but with 2 weeks off of therapy, her disease grew again (medial lesion larger and weaping).   She received 3 cycles of carboplatin and Taxol (01/18/2015 - 03/02/2015).  Chest wall lesions initially decreased then began to grow.  She received 2 cycles of adriamycin and Cytoxan (03/26/2015 - 04/11/2015) with Neulasta support.  ANC nadir was 200 on day 9 (04/03/2015).  Cycle #2 was complicated by mouth sores, decreased oral intake, and weight loss. Chest wall disease began to grow before cycle #3 could be administered.  PET scan on 04/26/2015 revealed extensive recurrence about the right chest.  The far lateral component measured 4.2 x 2.2 cm (S.U.V.13.5).  A portion superficial to the right-side of the sternum measured 5.2 x 2.2 cm (S.U.V. 9.1).  There was diffuse more inferior and lateral chest wall soft tissue thickening and hypermetabolism.    CA27.29 was 118.4 on 05/02/2015, 92.9 on 06/11/2015, 38.5 on 07/03/2015, and 27.4 on 07/24/2015.  She received 5 weeks of concurrent carboplatin and radiation (05/14/2015 - 06/11/2015).  Radiation completed on  06/15/2015.  Cutaneous lesions in the field of radiation improved dramatically.    [facility] testing on 05/22/2015 returned genomic alterations of ERBB2 (S310F and V777L), FGFR1 (amplification), CDH1 (E490*), CDKN2A (p16INK4a H83Y and p14ARF A97V), MYST3 (amplification), TERT (promotor -124C>T), TP53  (Y220C), and ZNF703 (amplification).  FDA approved therapies included ado-trastuzumab, lapatinib, pertuzumab, trastuzumab for ERBB2 as well as afatinib (approved in other tumor type) and pazopanib and ponatinib (approved in other tumor types for FGFR1).  She received 8 weeks of Herceptin (07/03/2015 - 08/21/2015).  Lesions continued to progress.  She began neratinib and Faslodex on 09/07/2015 on a [facility] clinical trial.  She has had a dramatic response.  She has had diarrhea associated with treatment.  Symptomatically, she is fatigued.  Oral intake has decreased.  She had a fever (101.7) last night.  She has urinary frequency.  Her port is not working.  She has had progressive anemia on outside labs.  Potassium is low on supplementation likely secondary to diarrhea.  Plan: 1.  Labs today:  CBC with diff, ferritin, type and screen, BMP. 2.  Urinalysis and culture. 3.  Blood cultures x 2. 4.  CXR for line placement. 5.  Continue B12 monthly (due today). 6.  Increase potassium to 10 meq BID. 7.  Discuss importance of fluid and caloric intake. 8.  Discuss management of diarrhea.  Addendum:  Patient and her daughter today returned after her studies.  Hematocrit has improved.  Discuss postponing transfusion.  CXR revealed that the tip of the port-a-cath abuts the lateral wall of the mid SVC.  Reviewed films with Dr. Adonis Huguenin.  Her port-a-cath will need to be removed.  He will see her tomorrow in clinic.   [name], MD  09/18/2015, 11:15 AM

## 2015-09-18 NOTE — Telephone Encounter (Signed)
I called and spoke to daughter and she states that she heard from [facility] the protocol nurse and she said after speaking to Dr. Beverley Fiedler that she was taking oral meds and it was working well on the breast cancer and so he saw no need to get a new port placed. Just take old one out.  I am faxing records to [phone] with her labs and cxr to protocol office. Also her potassium was low and wanted pt to inc. Potassium to bid and eat bananas and then when she goes to [place] on Friday and they will recheck her labs and dr Mike Gip said to ask staff there the potassium level and make decision  About what dose at that point.  Rhonda agreeable and she also wants to wait til she get through at school and then get it removed 2nd week of June.  I told her to check with surgeon tom at the appt as well as [facility] when they go on Friday. She is agreeable to this plan

## 2015-09-19 ENCOUNTER — Inpatient Hospital Stay: Payer: PPO

## 2015-09-19 ENCOUNTER — Encounter: Payer: Self-pay | Admitting: General Surgery

## 2015-09-19 ENCOUNTER — Other Ambulatory Visit: Payer: Self-pay | Admitting: *Deleted

## 2015-09-19 ENCOUNTER — Ambulatory Visit (INDEPENDENT_AMBULATORY_CARE_PROVIDER_SITE_OTHER): Payer: PPO | Admitting: General Surgery

## 2015-09-19 VITALS — BP 127/71 | HR 102 | Temp 98.1°F | Wt 108.0 lb

## 2015-09-19 DIAGNOSIS — C50911 Malignant neoplasm of unspecified site of right female breast: Secondary | ICD-10-CM | POA: Diagnosis not present

## 2015-09-19 DIAGNOSIS — Z95828 Presence of other vascular implants and grafts: Secondary | ICD-10-CM

## 2015-09-19 DIAGNOSIS — E538 Deficiency of other specified B group vitamins: Secondary | ICD-10-CM

## 2015-09-19 MED ORDER — TEMAZEPAM 15 MG PO CAPS: ORAL_CAPSULE | ORAL | Status: DC

## 2015-09-19 MED ORDER — CYANOCOBALAMIN 1000 MCG/ML IJ SOLN
1000.0000 ug | Freq: Once | INTRAMUSCULAR | Status: AC
  Administered 2015-09-19: 1000 ug via INTRAMUSCULAR
  Filled 2015-09-19: qty 1

## 2015-09-19 NOTE — Patient Instructions (Signed)
We have your Port removal surgery scheduled for June 12. Angie will call you with further details.

## 2015-09-19 NOTE — Progress Notes (Signed)
Outpatient Surgical Follow Up  09/19/2015  Dawn Wiley is an 81 y.o. female.   Chief Complaint  Patient presents with  . Follow-up    HPI: 81 year old female who is well-known to the surgery service returns to clinic today for evaluation of a nonfunctional Port-A-Cath. Patient reports that the catheter has been instilled with TPA on multiple encounters that has not been able to draw back blood for the last several days. The catheters only being used for blood draws and is not being used for chemotherapy anymore. Patient desires to have it removed. Patient is receiving a trial medication at [facility] that is due to end in the next 2 weeks. Patient currently denies any fevers, chills, nausea, vomiting, diarrhea, has patient, chest pain, shortness breath. She desires have the catheter removed after she completes her weekly visits to [facility] in 2 weeks.  Past Medical History  Diagnosis Date  . Kidney stones   . Hyperlipidemia   . Coronary artery disease     Coronary calcifications noted on CT scan  . Diabetes mellitus without complication ([place])   . Nephrolithiasis   . Hypertension   . Arthritis   . Anemia   . Tachycardia     Dr. Fletcher Anon, cardiologist  . Cancer [facility])     breast (right)  . PONV (postoperative nausea and vomiting)   . Rash     back  . Breast cancer [facility])     Past Surgical History  Procedure Laterality Date  . Temporomandibular joint surgery    . Cataract extraction      bilateral  . Breast surgery      breast biopsy X 3  . Eye surgery      bilateral cataract extraction  . Mastectomy modified radical Right 08/31/2014    Procedure: MASTECTOMY MODIFIED RADICAL;  Surgeon: Molly Maduro, MD;  Location: [facility] ORS;  Service: General;  Laterality: Right;  . Portacath placement Left 10/20/2014    Procedure: INSERTION PORT-A-CATH;  Surgeon: Marlyce Huge, MD;  Location: [facility] ORS;  Service: General;  Laterality: Left;    Family History  Problem Relation  Age of Onset  . Peripheral Artery Disease Sister     carotid artery stenosis   . Heart disease Sister   . Diabetes Sister     Social History:  reports that she has never smoked. She has never used smokeless tobacco. She reports that she does not drink alcohol or use illicit drugs.  Allergies: No Known Allergies  Medications reviewed.    ROS  A multipoint review of systems was completed. All pertinent positives and negatives are documented within the history of present illness and remainder are negative.  BP 127/71 mmHg  Pulse 102  Temp(Src) 98.1 F (36.7 C) (Oral)  Wt 48.988 kg (108 lb)  Physical Exam  Gen.: No acute distress Neck: Supple and nontender Lymph nodes: No palpable cervical or clavicular lymph nodes Chest: Surgically absent right breast with evidence of breast cancer recurrence to the chest wall and right arm. Chronic indwelling Chemo-Port palpable in the left chest without evidence of infection Heart: Tachycardic Abdomen: Soft and nontender   Results for orders placed or performed in visit on 09/18/15 (from the past 48 hour(s))  Urinalysis complete, with microscopic [facility])     Status: Abnormal   Collection Time: 09/18/15 12:00 PM  Result Value Ref Range   Color, Urine YELLOW (A) YELLOW   APPearance CLEAR (A) CLEAR   Glucose, UA NEGATIVE NEGATIVE mg/dL   Bilirubin Urine  NEGATIVE NEGATIVE   Ketones, ur 1+ (A) NEGATIVE mg/dL   Specific Gravity, Urine 1.021 1.005 - 1.030   Hgb urine dipstick NEGATIVE NEGATIVE   pH 5.0 5.0 - 8.0   Protein, ur 30 (A) NEGATIVE mg/dL   Nitrite NEGATIVE NEGATIVE   Leukocytes, UA TRACE (A) NEGATIVE   RBC / HPF 0-5 0 - 5 RBC/hpf   WBC, UA TOO NUMEROUS TO COUNT 0 - 5 WBC/hpf   Bacteria, UA FEW (A) NONE SEEN   Squamous Epithelial / LPF NONE SEEN NONE SEEN   Mucous PRESENT   Urine culture     Status: None (Preliminary result)   Collection Time: 09/18/15 12:00 PM  Result Value Ref Range   Specimen Description URINE, CLEAN CATCH     Special Requests NONE    Culture      CULTURE REINCUBATED FOR BETTER GROWTH Performed at [facility]    Report Status PENDING   Basic metabolic panel     Status: Abnormal   Collection Time: 09/18/15 12:02 PM  Result Value Ref Range   Sodium 136 135 - 145 mmol/L   Potassium 3.3 (L) 3.5 - 5.1 mmol/L   Chloride 102 101 - 111 mmol/L   CO2 24 22 - 32 mmol/L   Glucose, Bld 150 (H) 65 - 99 mg/dL   BUN 11 6 - 20 mg/dL   Creatinine, Ser 0.49 0.44 - 1.00 mg/dL   Calcium 8.9 8.9 - 10.3 mg/dL   GFR calc non Af Amer >60 >60 mL/min   GFR calc Af Amer >60 >60 mL/min    Comment: (NOTE) The eGFR has been calculated using the CKD EPI equation. This calculation has not been validated in all clinical situations. eGFR's persistently <60 mL/min signify possible Chronic Kidney Disease.    Anion gap 10 5 - 15   Dg Chest 2 View  09/18/2015  CLINICAL DATA:  Evaluate PICC line placement, history of right breast carcinoma EXAM: CHEST  2 VIEW COMPARISON:  Portable chest x-ray of 10/20/2014 FINDINGS: The tip of the left PICC line now appears to point laterally probably against the lateral wall of the superior vena cava in its midportion. Slight elevation of the left hemidiaphragm appears chronic. No infiltrate or effusion is seen. The heart is within upper limits of normal. IMPRESSION: The tip of the Port-A-Cath probably abuts the lateral wall of the mid SVC. No active lung disease. Electronically Signed   By: Ivar Drape M.D.   On: 09/18/2015 13:10    Assessment/Plan:  1. Portacath in place 81 year old female with a nonfunctional Port-A-Cath is not currently being used. She desires to have it removed. Discussed the procedure having removed in the operating room given the length of time that this was in place. The procedure itself was described in detail to include the risks, benefits, alternatives. Patient voiced understanding and desires to proceed. We will plan to take her to the operating room on  the morning of June 12 for removal of her Chemo-Port.     Clayburn Pert, MD FACS General Surgeon  09/19/2015,6:28 PM

## 2015-09-20 ENCOUNTER — Encounter: Payer: Self-pay | Admitting: Hematology and Oncology

## 2015-09-20 ENCOUNTER — Other Ambulatory Visit: Payer: Self-pay | Admitting: Hematology and Oncology

## 2015-09-20 ENCOUNTER — Other Ambulatory Visit: Payer: Self-pay | Admitting: *Deleted

## 2015-09-20 ENCOUNTER — Telehealth: Payer: Self-pay | Admitting: *Deleted

## 2015-09-20 ENCOUNTER — Telehealth: Payer: Self-pay

## 2015-09-20 DIAGNOSIS — N390 Urinary tract infection, site not specified: Secondary | ICD-10-CM

## 2015-09-20 LAB — URINE CULTURE: Culture: >=100000 — AB

## 2015-09-20 MED ORDER — AMOXICILLIN 875 MG PO TABS: 875.0000 mg | ORAL_TABLET | Freq: Two times a day (BID) | ORAL | Status: DC

## 2015-09-20 NOTE — Telephone Encounter (Signed)
Dawn Wiley called stating that cheryl the [facility] clinical trial needs ua and urine culture results faxed to her.  The results were faxed to [place] at [phone].

## 2015-09-20 NOTE — Telephone Encounter (Signed)
Called pt's daughter @[phone]  and reported per MD pt has UTI.  Doctor C called in amoxicillin to pharmacy.  Pt's daughter verbalized an understanding and will inform her mother.  Asked Daughter to call us tomorrow and she indicated they will be at [facility] tomorrow but will call us with any concerns.  Pt's daughter also verbalized that port will be removed on or around the 12th of June and per pt Dr. Duane Boston said it did not need to be replaced at this time.  No other concerns noted.

## 2015-09-21 ENCOUNTER — Telehealth: Payer: Self-pay | Admitting: General Surgery

## 2015-09-21 NOTE — Telephone Encounter (Signed)
Pt advised of pre op date/time and sx date. Rhonda--Daughter Sx: 10/08/15 with Dr Forrestine Him Removal. Pre op: 10/01/15 between 9-1:00pm--Phone.   Patient made aware to call [phone], between 1-3:00pm the day before surgery, to find out what time to arrive.

## 2015-09-23 LAB — CULTURE, BLOOD (ROUTINE X 2): Culture: NO GROWTH

## 2015-10-01 ENCOUNTER — Other Ambulatory Visit: Payer: PPO

## 2015-10-01 NOTE — Pre-Procedure Instructions (Signed)
[place]  Component Name Value Range  Vent Rate (bpm) 92   PR Interval (msec) 126   QRS Interval (msec) 82   QT Interval (msec) 362   QTc (msec) 447    Result Narrative  Normal sinus rhythm Early precordial QRS transition Nonspecific ST depression Nonspecific T wave abnormalities Abnormal ECG  No previous ECGs available I reviewed and concur with this report. Electronically signed [id number], MD, Eddie Dibbles [id number]) on 08/31/2015 9:44:50 PM   Status Results Details   Encounter Summary

## 2015-10-01 NOTE — Pre-Procedure Instructions (Signed)
Result status: Final result      *Sharon, Caroleen [id number]  [id number]  ------------------------------------------------------------------- Transthoracic Echocardiography  Patient: Dawn Wiley, [place] MR #: [id number] Study Date: 06/27/2015 Gender: F Age: 81 Height: 154.9 cm Weight: 48.2 kg BSA: 1.44 m^2 Pt. Status: Room:  SONOGRAPHER [facility]CS ATTENDING Pearlie Oyster, [name] C PERFORMING [facility]  cc:  ------------------------------------------------------------------- LV EF: 55% - 65%  ------------------------------------------------------------------- Indications: Chemo V67.2.  ------------------------------------------------------------------- Study Conclusions  - Left ventricle: The cavity size was normal. Systolic function was  normal. The estimated ejection fraction was in the range of 55%  to 65%. Wall motion was normal; there were no regional wall  motion abnormalities. Doppler parameters are consistent with  abnormal left ventricular relaxation (grade 1 diastolic  dysfunction). - Mitral valve: There was mild regurgitation. - Left atrium: The atrium was normal in size. - Right ventricle: Systolic function was normal. - Tricuspid valve: There was mild-moderate regurgitation. - Pulmonary arteries: Systolic pressure was mildly elevated. PA  peak pressure: 41 mm Hg (S).  Transthoracic echocardiography. M-mode, complete 2D, spectral Doppler, and color Doppler. Birthdate: Patient birthdate: 1934/12/17. Age: Patient is 81 yr old. Sex: Gender: female. BMI: 20.1 kg/m^2. Patient status: Outpatient. Study date: Study date: 06/27/2015. Study time: 10:31  AM.  -------------------------------------------------------------------  ------------------------------------------------------------------- Left ventricle: The cavity size was normal. Systolic function was normal. The estimated ejection fraction was in the range of 55% to 65%. Wall motion was normal; there were no regional wall motion abnormalities. Doppler parameters are consistent with abnormal left ventricular relaxation (grade 1 diastolic dysfunction).  ------------------------------------------------------------------- Aortic valve: Trileaflet; normal thickness leaflets. Mobility was not restricted. Doppler: Transvalvular velocity was within the normal range. There was no stenosis. There was trivial regurgitation. Peak velocity ratio of LVOT to aortic valve: 0.69. Valve area (Vmax): 2.17 cm^2. Indexed valve area (Vmax): 1.51 cm^2/m^2. Peak gradient (S): 9 mm Hg.  ------------------------------------------------------------------- Aorta: Aortic root: The aortic root was normal in size.  ------------------------------------------------------------------- Mitral valve: Calcified annulus. Mobility was not restricted. Doppler: Transvalvular velocity was within the normal range. There was no evidence for stenosis. There was mild regurgitation. Peak gradient (D): 3 mm Hg.  ------------------------------------------------------------------- Left atrium: The atrium was normal in size.  ------------------------------------------------------------------- Right ventricle: The cavity size was normal. Wall thickness was normal. Systolic function was normal.  ------------------------------------------------------------------- Pulmonic valve: Doppler: Transvalvular velocity was within the normal range. There was no evidence for stenosis.  ------------------------------------------------------------------- Tricuspid valve: Structurally normal valve.  Doppler: Transvalvular velocity was within the normal range. There was mild-moderate regurgitation.  ------------------------------------------------------------------- Pulmonary artery: The main pulmonary artery was normal-sized. Systolic pressure was mildly elevated.  ------------------------------------------------------------------- Right atrium: The atrium was normal in size.  ------------------------------------------------------------------- Pericardium: There was no pericardial effusion.  ------------------------------------------------------------------- Systemic veins: Inferior vena cava: The vessel was normal in size.  ------------------------------------------------------------------- Measurements  Left ventricle Value Reference LV ID, ED, PLAX chordal (L) 32.2 mm 43 - 52 LV ID, ES, PLAX chordal (L) 20.6 mm 23 - 38 LV fx shortening, PLAX chordal 36 % >=29 LV PW thickness, ED 9.95 mm --------- IVS/LV PW ratio, ED 1.05 <=1.3 LV e&', lateral 5.66 cm/s --------- LV E/e&', lateral 14.82 --------- LV e&', medial 6.31 cm/s --------- LV E/e&', medial 13.3 --------- LV e&', average 5.99 cm/s --------- LV E/e&', average 14.02 ---------  Ventricular septum Value Reference IVS thickness, ED 10.4 mm ---------  LVOT Value Reference LVOT ID, S 20 mm --------- LVOT area 3.14 cm^2 ---------  LVOT peak velocity,  S 105 cm/s ---------  Aortic valve Value Reference Aortic valve peak velocity, S 152 cm/s --------- Aortic peak gradient, S 9 mm Hg --------- Velocity ratio, peak, LVOT/AV 0.69 --------- Aortic valve area, peak velocity 2.17 cm^2 --------- Aortic valve area/bsa, peak 1.51 cm^2/m^2 --------- velocity  Aorta Value Reference Aortic root ID, ED 29 mm ---------  Left atrium Value Reference LA ID, A-P, ES 32 mm --------- LA ID/bsa, A-P (H) 2.22 cm/m^2 <=2.2 LA volume, S 48.9 ml --------- LA volume/bsa, S 34 ml/m^2 --------- LA volume, ES, 1-p A4C 48.7 ml --------- LA volume/bsa, ES, 1-p A4C 33.8 ml/m^2 --------- LA volume, ES, 1-p A2C 44.1 ml --------- LA volume/bsa, ES, 1-p A2C 30.6 ml/m^2 ---------  Mitral valve Value Reference Mitral E-wave peak velocity 83.9 cm/s --------- Mitral A-wave peak velocity 123 cm/s --------- Mitral deceleration time (H) 322 ms 150 - 230 Mitral peak gradient, D 3 mm Hg --------- Mitral E/A ratio, peak 0.7 ---------  Pulmonary arteries Value Reference PA pressure, S, DP (H) 41 mm Hg <=30  Right ventricle Value Reference RV ID, ED, PLAX 29.7 mm 19 -  38 TAPSE 17.1 mm ---------  Pulmonic valve Value Reference Pulmonic valve peak velocity, S 81.5 cm/s ---------  Legend: (L) and (H) mark values outside specified reference range.  ------------------------------------------------------------------- Prepared and Electronically Authenticated by  Esmond Plants, MD, [facility] 2017-03-01T14:02:16    PACS Images    Show images for ECHOCARDIOGRAM COMPLETE    Patient Information    Patient Name Sex DOB SSN   Jazabella, Brush Female 23-Sep-1934 [id number]    Reason For Exam  Priority: STAT   Chemo V67.2 / Z09   Dx: Breast cancer, stage 3, right ([place]) [C50.911 (ICD-10-CM)]    Performing Technologist/Nurse    Performing Technologist/Nurse:     Implants     No active implants to display in this view.    Order-Level Documents:    There are no order-level documents.    Encounter-Level Documents - 06/27/2015:      Scan on 06/28/2015 2:20 PM by Provider Default, MDScan on 06/28/2015 2:20 PM by Provider Default, MD     Electronic signature on 06/27/2015 10:25 AM    Signed    Electronically signed by Minna Merritts, MD on 06/27/15 at 1402 EST    Printable Result Report    Result Report    ECHOCARDIOGRAM COMPLETE (Order [id number])  Echocardiography  Order: [id number]   Released By: Sharene Skeans  Authorizing: [name], MD   Date: 06/27/2015  Department: [facility]       Procedure Abnormality Status    ECHOCARDIOGRAM COMPLETE      Order Information     Order Date/Time Release Date/Time Start Date/Time End Date/Time    06/27/15 10:26 AM 06/27/15 10:26 AM 06/27/15 10:26 AM 06/27/2015      Order Details     Frequency Duration Priority Order Class    As needed 1 occurrence STAT Ancillary Performed      Acc#    [id number]      Order Questions      Question Answer Comment    Where should this test be performed [facility]     Complete or Limited study? Limited     With Image Enhancing Agent or without Image Enhancing Agent? Without Image Enhancing Agent     Note:  Select Without Image Enhancing Agent only if contraindicated.    Contraindication for Image Enhancing Agent? Per Appointment  Conversion     Expected Date: ASAP     Reason for exam-Echo Chemo V67.2 / Z09       Associated Diagnoses       ICD-9-CM ICD-10-CM    Breast cancer, stage 3, right ([place])    174.9 C50.911      Authorizing Provider [address]     Date/Time Authorizing Provider Changed by    06/27/2015 10:26 AM [name], MD [name], MD      Order Requisition     ECHOCARDIOGRAM COMPLETE (Order [id number]) on 06/27/15     Collection Information     Collected: 06/27/2015 10:31 AM   Resulting Agency: [facility]     ECHOCARDIOGRAM COMPLETE (Order [id number]) on 06/27/15

## 2015-10-01 NOTE — Pre-Procedure Instructions (Signed)
Component Name Value Range  LV Ejection Fraction (%) 55 %  Right Ventricle Systolic Pressure (mmHg) 34 mmHg   Left Atrium Diameter (cm) 4.1 cm  LV End Diastolic Diameter (cm) 3.6 cm  LV End Systolic Diameter (cm) 2.7 cm  LV Septum Wall Thickness (cm) 1.1 cm  LV Posterior Wall Thickness (cm) 0.9 cm  Tricuspid Valve Regurgitation Grade moderate   Tricuspid Valve Regurgitation Max Velocity (m/s) 2.8 m/s  Mitral Valve Regurgitation Grade mild   Mitral Valve Stenosis Grade none   Aortic Valve Regurgitation Grade trivial   Aortic Valve Stenosis Grade none    Result Narrative             [facility]            Jerriah, Troyan                                                      [id number]  DOB: 1935/03/14                CARDIAC DIAGNOSTIC UNIT               Date: 08/31/2015 15:30:00                                                      Adult     Female Age: 81                  ECHO-DOPPLER REPORT                 Outpatient                                                      MPDC ---------------------------------------------------- MD1: Verlan Friends     STUDY: Chest Wall          TAPE: 0000:00:0:00:00 BP: 125/65      ECHO: Yes   DOPPLER: Yes  FILE: [id number]:   HR: 105     COLOR: Yes  CONTRAST: No      MACHINE: EpiQ #3   Height: 60 in RV BIOPSY: No         3D: No   SOUND QLTY: Moderate  Weight: 110 lbs    MEDIUM: None                                      BSA: 1.45,  BMI: 21.50 ------------------------------------------------------------------------------    HISTORY:  Malignancy     REASON: Assess LV function INDICATION: C50.911 - Malignant neoplasm of unspecified site of right female             breast , unspecified (CMS-HCC). C50.912 - Malignant neoplasm of             unspecified site of left female breast , unspecified (CMS-HCC).             Z00.6 - Encounter for examination for normal  comparison and             control in clinical research program. C50.919 -  Malignant             neoplasm of unspecified site of unspecified female breast             (CMS-HCC).   ECHOCARDIOGRAPHIC MEASUREMENTS ----------------------------------------------- 2D DIMENSIONS AORTA          Values     Normal RangeMAIN PA      Values     Normal Range      Annulus:  nm*  cm    [1.9 - 2.7]    PA Main:  nm*  cm    [1.5 - 2.1]    Aorta Sin:   2.8 cm    [2.4 - 3.6] RIGHT VENTRICLE  ST Junction:  nm*  cm    [2 - 3.2]      RV Base:   4.3 cm    [2.5 - 4.1]    Asc.Aorta:   3.2 cm    [1.9 - 3.5]     RV Mid:   2.4 cm    [1.9 - 3.5] LEFT VENTRICLE                         RV Length:  nm*  cm    [  ]        LVIDd:   3.6 cm    [3.7 - 5.3] RIGHT ATRIUM        LVIDs:   2.7 cm    [2.2 - 3.4]    RA Area:  18   cm2   [ <= 20]       LVEDVi:  nm*  ml/m2 [29 - 61]         RAVi:  37   ml/m2 [15 - 27]       LVESVi:  nm*  ml/m2 [8 - 24]    INFERIOR VENA CAVA           FS:  25   %     [ >= 25]       Max.IVC:  nm*  cm    [ <= 2.1]          SWT:   1.1 cm    [0.6 - 0.9]    Min.IVC:  nm*  cm    [ <= 1.7]          PWT:   0.9 cm    [0.6 - 0.9] __________________ LEFT ATRIUM                           nm* - not measured      LA Diam:   4.1 cm    [2.7 - 3.8]      LA Area:  21   cm2   [ <= 20]    LA Volume:  76   ml    [22 - 52]         LAVi:  52   ml/m2 [16 - 34]  ECHOCARDIOGRAPHIC DESCRIPTIONS ----------------------------------------------- AORTIC ROOT         Size: Normal   Dissection: INDETERM FOR DISSECTION  AORTIC VALVE     Leaflets: Tricuspid             Morphology: MILDLY THICKENED     Mobility: Fully Mobile  LEFT VENTRICLE  Anterior: Normal         Size: SMALL                                  Lateral: Normal  Contraction: Normal                                  Septal: Normal   Closest EF: >55% (Estimated)    Calc.EF: 60%        Apical: Normal    LV masses: No Masses                             Inferior: Normal          LVH: MILD LVH                              Posterior: Normal  LV GLS(AVG): -16.1% Normal Range [ <= -16] Dias.FxClass: RELAXATION ABNORMALITY (GRADE 1) CORRESPONDS TO E/A REVERSAL  MITRAL VALVE     Leaflets: Normal                  Mobility: Fully mobile   Morphology: Normal  LEFT ATRIUM         Size: MILDLY ENLARGED    LA masses: No masses               Normal IAS  MAIN PA         Size: Normal  PULMONIC VALVE   Morphology: Normal     Mobility: Fully Mobile  RIGHT VENTRICLE         Size: Normal                    Free wall: Normal  Contraction: Normal                    RV masses: No Masses        TAPSE:   2.2 cm,  Normal Range [>= 1.6 cm]  TRICUSPID VALVE     Leaflets: Normal                  Mobility: Fully mobile   Morphology: Normal  RIGHT ATRIUM         Size: Normal                     RA Other: None    RA masses: No masses  PERICARDIUM        Fluid: No effusion  INFERIOR VENACAVA         Size: Normal     Normal respiratory collapse  DOPPLER ECHO and OTHER SPECIAL PROCEDURES ------------------------------------    Aortic: TRIVIAL AR             No AS     Mitral: MILD MR                No MS    MV Inflow E Vel.= 76.0 cm/s  MV Annulus E'Vel.= 5.0 cm/s  E/E'Ratio= 15  Tricuspid: MODERATE TR            No TS            2.8 m/s peak TR vel   34 mmHg peak RV pressure  Pulmonary: TRIVIAL  PR             No PS      Other:  INTERPRETATION ---------------------------------------------------------------   NORMAL LEFT VENTRICULAR SYSTOLIC FUNCTION WITH MILD LVH   NORMAL LA PRESSURES WITH DIASTOLIC DYSFUNCTION   NORMAL RIGHT VENTRICULAR SYSTOLIC FUNCTION   VALVULAR REGURGITATION: TRIVIAL AR, MILD MR, TRIVIAL PR, MODERATE TR   NO VALVULAR STENOSIS   NO PRIOR STUDY FOR COMPARISON   (Report version 3.0)                    Interpreted and Electronically signed  Perform. by: Florian Buff, RCS, RCCS                 by: Gwynneth Albright, M  Resp.Person: Florian Buff, RCS, RCCS                 On:  08/31/2015 18:04:37   Status Results Details   Encounter Summary

## 2015-10-02 ENCOUNTER — Inpatient Hospital Stay: Payer: PPO

## 2015-10-02 ENCOUNTER — Encounter: Payer: Self-pay | Admitting: *Deleted

## 2015-10-02 ENCOUNTER — Inpatient Hospital Stay: Payer: PPO | Attending: Hematology and Oncology | Admitting: Hematology and Oncology

## 2015-10-02 ENCOUNTER — Encounter: Payer: Self-pay | Admitting: Hematology and Oncology

## 2015-10-02 VITALS — BP 130/69 | HR 90 | Temp 98.5°F | Ht 60.0 in | Wt 105.0 lb

## 2015-10-02 DIAGNOSIS — I2510 Atherosclerotic heart disease of native coronary artery without angina pectoris: Secondary | ICD-10-CM | POA: Insufficient documentation

## 2015-10-02 DIAGNOSIS — D509 Iron deficiency anemia, unspecified: Secondary | ICD-10-CM

## 2015-10-02 DIAGNOSIS — Z171 Estrogen receptor negative status [ER-]: Secondary | ICD-10-CM | POA: Diagnosis not present

## 2015-10-02 DIAGNOSIS — IMO0001 Reserved for inherently not codable concepts without codable children: Secondary | ICD-10-CM

## 2015-10-02 DIAGNOSIS — Z7982 Long term (current) use of aspirin: Secondary | ICD-10-CM | POA: Insufficient documentation

## 2015-10-02 DIAGNOSIS — Z7984 Long term (current) use of oral hypoglycemic drugs: Secondary | ICD-10-CM | POA: Diagnosis not present

## 2015-10-02 DIAGNOSIS — C779 Secondary and unspecified malignant neoplasm of lymph node, unspecified: Secondary | ICD-10-CM | POA: Insufficient documentation

## 2015-10-02 DIAGNOSIS — E785 Hyperlipidemia, unspecified: Secondary | ICD-10-CM

## 2015-10-02 DIAGNOSIS — C50911 Malignant neoplasm of unspecified site of right female breast: Secondary | ICD-10-CM | POA: Diagnosis not present

## 2015-10-02 DIAGNOSIS — R197 Diarrhea, unspecified: Secondary | ICD-10-CM | POA: Diagnosis not present

## 2015-10-02 DIAGNOSIS — R000 Tachycardia, unspecified: Secondary | ICD-10-CM | POA: Diagnosis not present

## 2015-10-02 DIAGNOSIS — E119 Type 2 diabetes mellitus without complications: Secondary | ICD-10-CM | POA: Diagnosis not present

## 2015-10-02 DIAGNOSIS — I10 Essential (primary) hypertension: Secondary | ICD-10-CM | POA: Diagnosis not present

## 2015-10-02 DIAGNOSIS — C792 Secondary malignant neoplasm of skin: Secondary | ICD-10-CM | POA: Diagnosis not present

## 2015-10-02 DIAGNOSIS — Z923 Personal history of irradiation: Secondary | ICD-10-CM

## 2015-10-02 DIAGNOSIS — E538 Deficiency of other specified B group vitamins: Secondary | ICD-10-CM | POA: Diagnosis not present

## 2015-10-02 DIAGNOSIS — M129 Arthropathy, unspecified: Secondary | ICD-10-CM | POA: Insufficient documentation

## 2015-10-02 DIAGNOSIS — Z87442 Personal history of urinary calculi: Secondary | ICD-10-CM | POA: Diagnosis not present

## 2015-10-02 DIAGNOSIS — Z79899 Other long term (current) drug therapy: Secondary | ICD-10-CM | POA: Insufficient documentation

## 2015-10-02 DIAGNOSIS — R21 Rash and other nonspecific skin eruption: Secondary | ICD-10-CM | POA: Insufficient documentation

## 2015-10-02 DIAGNOSIS — R634 Abnormal weight loss: Secondary | ICD-10-CM

## 2015-10-02 NOTE — Patient Instructions (Signed)
  Your procedure is scheduled on: 10-08-15  Report to [place] To find out your arrival time please call [phone] between 1PM - 3PM on 10-05-15  Remember: Instructions that are not followed completely may result in serious medical risk, up to and including death, or upon the discretion of your surgeon and anesthesiologist your surgery may need to be rescheduled.    _X___ 1. Do not eat food or drink liquids after midnight. No gum chewing or hard candies.     _X___ 2. No Alcohol for 24 hours before or after surgery.   ____ 3. Bring all medications with you on the day of surgery if instructed.    ____ 4. Notify your doctor if there is any change in your medical condition     (cold, fever, infections).     Do not wear jewelry, make-up, hairpins, clips or nail polish.  Do not wear lotions, powders, or perfumes. You may wear deodorant.  Do not shave 48 hours prior to surgery. Men may shave face and neck.  Do not bring valuables to the hospital.    [facility] is not responsible for any belongings or valuables.               Contacts, dentures or bridgework may not be worn into surgery.  Leave your suitcase in the car. After surgery it may be brought to your room.  For patients admitted to the hospital, discharge time is determined by your treatment team.   Patients discharged the day of surgery will not be allowed to drive home.   Please read over the following fact sheets that you were given:   _X___ Take these medicines the morning of surgery with A SIP OF WATER:    1.AMLODIPINE  2. METOPROLOL  3. MAY TAKE HYDROCODONE IF NEEDED AM OF SURGERY  4.  5.  6.  ____ Fleet Enema (as directed)   ____ Use CHG Soap as directed  ____ Use inhalers on the day of surgery  _X___ Stop metformin 2 days prior to surgery-LAST DOSE ON Friday, 10-05-15    ____ Take 1/2 of usual insulin dose the night before surgery and none on the morning of surgery.   _X___ Stop  Coumadin/Plavix/aspirin-STOP ASA NOW  _X___ Stop Anti-inflammatories-NO NSAIDS OR ASA PRODUCTS-HYDROCODONE OK TO CONTINUE   ____ Stop supplements until after surgery.    ____ Bring C-Pap to the hospital.

## 2015-10-02 NOTE — Progress Notes (Signed)
[facility] day:  10/02/2015   Chief Complaint: Dawn Wiley is an 81 y.o. female with stage IV right breast cancer who is seen for assessment on day 26 of cycle #1 neratinib and Faslodex on [facility] clinical trial.   HPI: The patient was last seen in the medical oncology clinic on 09/18/2015.  At that time, she was day 12 after initiation of neratinib and Faslodex.  She already had a significant clinical response.  She noted diarrhea due to her treatment.  She had a fever.  Blood cultures were negative.  At last visit, she had issues with her port.  CXR revealed that the tip of the port-a-cath abuts the lateral wall of the mid SVC.  She met with Dr Adonis Huguenin.  Her port is scheduled to be removed on 10/08/2015.  During the interim, she states that she is handling her diarrhea better.  She uses imodium every 6 hours and prn.  She is eating small frequent meals. She has lost 3 pounds.  She goes in for her month assessment at [facility] on Friday, 10/05/2015.  [facility] labs on 09/28/2015 revealed a hematocrit of 30.1, hemoglobin 8.9, MCV 91, platelets 251,000, white count 4000 with an ANC of 2200. Comprehensive metabolic panel included a sodium of 138, potassium 4.1, BUN 14, creatinine 0.5, and albumen 3.7.  Liver function tests were normal.    Past Medical History  Diagnosis Date  . Kidney stones   . Hyperlipidemia   . Coronary artery disease     Coronary calcifications noted on CT scan  . Diabetes mellitus without complication ([place])   . Nephrolithiasis   . Hypertension   . Arthritis   . Anemia   . Tachycardia     Dr. Fletcher Anon, cardiologist  . Cancer [facility])     breast (right)  . PONV (postoperative nausea and vomiting)   . Rash     back  . Breast cancer [facility])     Past Surgical History  Procedure Laterality Date  . Temporomandibular joint surgery    . Cataract extraction      bilateral  . Breast surgery      breast biopsy X 3  . Eye surgery       bilateral cataract extraction  . Mastectomy modified radical Right 08/31/2014    Procedure: MASTECTOMY MODIFIED RADICAL;  Surgeon: Molly Maduro, MD;  Location: [facility] ORS;  Service: General;  Laterality: Right;  . Portacath placement Left 10/20/2014    Procedure: INSERTION PORT-A-CATH;  Surgeon: Marlyce Huge, MD;  Location: [facility] ORS;  Service: General;  Laterality: Left;    Family History  Problem Relation Age of Onset  . Peripheral Artery Disease Sister     carotid artery stenosis   . Heart disease Sister   . Diabetes Sister     Social History:  reports that she has never smoked. She has never used smokeless tobacco. She reports that she does not drink alcohol or use illicit drugs.  The patient is alone today.  Her daughter's phone number is [phone].    Allergies: No Known Allergies  Current Medications: Current Outpatient Prescriptions  Medication Sig Dispense Refill  . acetaminophen (TYLENOL) 500 MG tablet Take 500 mg by mouth every 6 (six) hours as needed for mild pain or moderate pain.     [name] Kitchen amLODipine (NORVASC) 5 MG tablet Take 1 tablet (5 mg total) by mouth daily. 90 tablet 1  . aspirin 81 MG tablet Take 81 mg  by mouth every other day.    . Calcium Carbonate (CALTRATE 600 PO) Take 1,200 mg by mouth daily.     . cholecalciferol (VITAMIN D) 400 UNITS TABS tablet Take 800 Units by mouth.    . Cyanocobalamin (VITAMIN B-12 IJ) Inject as directed every 30 (thirty) days.     . diphenhydrAMINE (BENADRYL) 25 mg capsule Take 25 mg by mouth every 6 (six) hours as needed for allergies.    . ferrous sulfate 325 (65 FE) MG tablet Take 325 mg by mouth daily with breakfast.     . glucose blood test strip Test blood sugars every morning and after meals DX E 11.19 100 each 12  . HYDROcodone-acetaminophen (NORCO) 7.5-325 MG tablet Take 1 tablet by mouth every 6 (six) hours as needed for moderate pain. 60 tablet 0  . Lancets (SAFETY LANCET 28G/PRESSURE ACT) MISC 1 each by Other route  as needed (to test glucose levels). 50 each 12  . loperamide (IMODIUM) 2 MG capsule     . magic mouthwash SOLN Take 5 mLs by mouth 4 (four) times daily. 200 mL 1  . metFORMIN (GLUCOPHAGE) 500 MG tablet Take 1 tablet (500 mg total) by mouth 2 (two) times daily with a meal. 180 tablet 1  . metFORMIN (GLUCOPHAGE) 500 MG tablet TAKE 1 TABLET (500 MG TOTAL) BY MOUTH 2 (TWO) TIMES DAILY WITH A MEAL. 180 tablet 1  . metoprolol tartrate (LOPRESSOR) 25 MG tablet Take 1 tablet (25 mg total) by mouth 2 (two) times daily. 180 tablet 2  . potassium chloride (K-DUR,KLOR-CON) 10 MEQ tablet Take 1 tablet (10 mEq total) by mouth daily. 30 tablet 2  . temazepam (RESTORIL) 15 MG capsule 1 tablet at night as needed for sleep 30 capsule 1   No current facility-administered medications for this visit.   Facility-Administered Medications Ordered in Other Visits  Medication Dose Route Frequency Provider Last Rate Last Dose  . sodium chloride 0.9 % injection 10 mL  10 mL Intravenous PRN [name], MD      . sodium chloride 0.9 % injection 10 mL  10 mL Intravenous PRN [name], MD   10 mL at 03/21/15 0849  . sodium chloride 0.9 % injection 10 mL  10 mL Intracatheter PRN [name], MD   10 mL at 04/25/15 0834  . sodium chloride flush (NS) 0.9 % injection 10 mL  10 mL Intravenous PRN [name], MD   10 mL at 09/18/15 1423    Review of Systems:  GENERAL: Feels "little better". No fevers or sweats. Weight down 3 pounds. PERFORMANCE STATUS (ECOG): 1 HEENT: No visual changes, runny nose, sore throat, or tenderness. Lungs: No shortness of breath or cough. No hemoptysis. Cardiac: No chest pain, palpitations, orthopnea, or PND. GI: Diarrhea, better managed.  No nausea, vomiting, constipation, melena or hematochezia. GU: No urgency, frequency, dysuria, or hematuria. Musculoskeletal: No back pain. No joint pain. No muscle tenderness. Extremities: No pain or swelling. Skin:  Chest wall lesions, improving. Neuro: No headache, numbness or weakness, balance or coordination issues. Endocrine: Diabetes.  No thyroid issues, hot flashes or night sweats. Psych: No mood changes, depression or anxiety.  trouble sleeping. Pain: No focal pain. Review of systems: All other systems reviewed and found to be negative.  Physical Exam: Blood pressure 130/69, pulse 90, temperature 98.5 F (36.9 C), temperature source Tympanic, height 5' (1.524 m), weight 105 lb 0.8 oz (47.65 kg). GENERAL: Thin fatigued appearing elderly woman sitting comfortably  in the exam room in no acute distress. MENTAL STATUS: Alert and oriented to person, place and time. HEAD: Short silver wig. Normocephalic, atraumatic, face symmetric, no Cushingoid features. EYES: Blue eyes. Pupils equal round and reactive to light and accomodation.  No conjunctivitis or scleral icterus. RESPIRATORY:  Clear to auscultation without rales, wheezes or rhonchi. CARDIOVASCULAR:  Regular rate and rhythm without murmur, rub or gallop. BREAST: Right mastectomy. Flattening pale pink right chest wall medial lesion measuring 2 x 5 cm. Nodular area 5 x 4 cm medially involving some of left breast.  Flattening pale pink lesion on right lateral back 7 x 8 cm.  No new lesions.  Extent of disease 34 cm x 17 cm. ABDOMEN:  Soft, non-tender, with active bowel sounds, and no hepatosplenomegaly.  No masses. SKIN: Chest wall lesions as above.  Upper right arm 5.5 x 4.5 cm flattening and pale pink. EXTREMITIES: No edema, no skin discoloration or tenderness. No palpable cords. LYMPH NODES: No palpable axillary adenopathy  NEUROLOGICAL: Unremarkable. PSYCH: Appropriate.   No visits with results within 3 Day(s) from this visit. Latest known visit with results is:  Appointment on 09/18/2015  Component Date Value Ref Range Status  . Sodium 09/18/2015 136  135 - 145 mmol/L Final  . Potassium 09/18/2015 3.3* 3.5 - 5.1 mmol/L Final   . Chloride 09/18/2015 102  101 - 111 mmol/L Final  . CO2 09/18/2015 24  22 - 32 mmol/L Final  . Glucose, Bld 09/18/2015 150* 65 - 99 mg/dL Final  . BUN 09/18/2015 11  6 - 20 mg/dL Final  . Creatinine, Ser 09/18/2015 0.49  0.44 - 1.00 mg/dL Final  . Calcium 09/18/2015 8.9  8.9 - 10.3 mg/dL Final  . GFR calc non Af Amer 09/18/2015 >60  >60 mL/min Final  . GFR calc Af Amer 09/18/2015 >60  >60 mL/min Final   Comment: (NOTE) The eGFR has been calculated using the CKD EPI equation. This calculation has not been validated in all clinical situations. eGFR's persistently <60 mL/min signify possible Chronic Kidney Disease.   . Anion gap 09/18/2015 10  5 - 15 Final  . Color, Urine 09/18/2015 YELLOW* YELLOW Final  . APPearance 09/18/2015 CLEAR* CLEAR Final  . Glucose, UA 09/18/2015 NEGATIVE  NEGATIVE mg/dL Final  . Bilirubin Urine 09/18/2015 NEGATIVE  NEGATIVE Final  . Ketones, ur 09/18/2015 1+* NEGATIVE mg/dL Final  . Specific Gravity, Urine 09/18/2015 1.021  1.005 - 1.030 Final  . Hgb urine dipstick 09/18/2015 NEGATIVE  NEGATIVE Final  . pH 09/18/2015 5.0  5.0 - 8.0 Final  . Protein, ur 09/18/2015 30* NEGATIVE mg/dL Final  . Nitrite 09/18/2015 NEGATIVE  NEGATIVE Final  . Leukocytes, UA 09/18/2015 TRACE* NEGATIVE Final  . RBC / HPF 09/18/2015 0-5  0 - 5 RBC/hpf Final  . WBC, UA 09/18/2015 TOO NUMEROUS TO COUNT  0 - 5 WBC/hpf Final  . Bacteria, UA 09/18/2015 FEW* NONE SEEN Final  . Squamous Epithelial / LPF 09/18/2015 NONE SEEN  NONE SEEN Final  . Mucous 09/18/2015 PRESENT   Final  . Specimen Description 09/18/2015 URINE, CLEAN CATCH   Final  . Special Requests 09/18/2015 NONE   Final  . Culture 09/18/2015 >=100,000 COLONIES/mL ENTEROCOCCUS FAECALIS*  Final  . Report Status 09/18/2015 09/20/2015 FINAL   Final  . Organism ID, Bacteria 09/18/2015 ENTEROCOCCUS FAECALIS*  Final    Assessment:  Dawn Wiley is an 81 y.o. female with stage IV breast cancer.  She presented with stage IIIC  right breast cancer  status post mastectomy and axillary lymph node dissection on 08/31/2014. 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 Carcinoma involved skeletal muscle and dermal lymphatics. Tumor was less than 0.5 mm from the deep margin. Thirteen of 15 lymph nodes were involved. The largest metastatic focus was 2 cm. Tumor is triple negative (ER negative, PR negative, and HER-2/neu negative).  Androgen receptor and PDL-1 testing was negative.  Pathologic stage was T3N3aMx.  PET scan on 09/21/2014 revealed postoperative changes in the right chest with no suspicious findings or residual tumor. Bone scan on 10/11/2014 revealed no evidence of metastatic disease.  Bone density study on 10/02/2014 revealed a T score of -4.7 in the forearm consistent with osteoporosis. T-score was less than 2.5 in the spine or hip. Echo on 10/02/2014 revealed ejection fraction of 60-65%.  Echo on 06/27/2015 revealed EF of 55-60%.  She has iron deficiency anemia. Labs on 09/19/2014 revealed a hematocrit of 29.9, hemoglobin 8.7, MCV 69.7, ferritin 9, and TIBC 616. B12 was low (265) with a prior history of B12 deficiency and need for supplementation (stopped 06/2014). She began B12 on 10/12/2014 (last 08/21/2015).  Folate was normal.  Her diet is modest. She has never had a colonoscopy. She denies any melena or hematochezia.  She takes 1 iron pill a day (additional iron causes diarrhea).  Ferritin was 116 on 04/25/2015.  Chest wall biopsy x 3 (lateral, medial, and middle sites) on 11/08/2014 revealed dermal lymphatic involvement by invasive mammary carcinoma with micropapillary features.  Right arm punch biopsy on 06/28/2015 confirmed breast cancer.  She has aggressive disease with growth despite several chemotherapeutic agents.  She received 5 weeks of Taxol (11/03/2014 - 12/01/2014).  She received 1 week of adriamycin (12/14/2014).  She received 1 cycle of  carboplatin (12/28/2014).  After carboplatin, disease appeared to stabilize, but with 2 weeks off of therapy, her disease grew again (medial lesion larger and weaping).   She received 3 cycles of carboplatin and Taxol (01/18/2015 - 03/02/2015).  Chest wall lesions initially decreased then began to grow.  She received 2 cycles of adriamycin and Cytoxan (03/26/2015 - 04/11/2015) with Neulasta support.  ANC nadir was 200 on day 9 (04/03/2015).  Cycle #2 was complicated by mouth sores, decreased oral intake, and weight loss. Chest wall disease began to grow before cycle #3 could be administered.  PET scan on 04/26/2015 revealed extensive recurrence about the right chest.  The far lateral component measured 4.2 x 2.2 cm (S.U.V.13.5).  A portion superficial to the right-side of the sternum measured 5.2 x 2.2 cm (S.U.V. 9.1).  There was diffuse more inferior and lateral chest wall soft tissue thickening and hypermetabolism.    CA27.29 was 118.4 on 05/02/2015, 92.9 on 06/11/2015, 38.5 on 07/03/2015, and 27.4 on 07/24/2015.  She received 5 weeks of concurrent carboplatin and radiation (05/14/2015 - 06/11/2015).  Radiation completed on 06/15/2015.  Cutaneous lesions in the field of radiation improved dramatically.    [facility] One testing on 05/22/2015 returned genomic alterations of ERBB2 (S310F and V777L), FGFR1 (amplification), CDH1 (E490*), CDKN2A (p16INK4a H83Y and p14ARF A97V), MYST3 (amplification), TERT (promotor -124C>T), TP53  (Y220C), and ZNF703 (amplification).  FDA approved therapies included ado-trastuzumab, lapatinib, pertuzumab, trastuzumab for ERBB2 as well as afatinib (approved in other tumor type) and pazopanib and ponatinib (approved in other tumor types for FGFR1).  She received 8 weeks of Herceptin (07/03/2015 - 08/21/2015).  Lesions continued to progress.  PET scan on 08/31/2015 revealed the interval development of multifocal hypermetabolic  skin thickening and masses, representing new  cutaneous metastases. Interval resolution of superior right anterior chest wall subcutaneous lesion.  There was interval development of multiple hypermetabolic left axillary lymph nodes compatible with metastasis. There was interval resolution of right axillary hypermetabolic lymph nodes.   Bone scan on 08/31/2015 revealed no evidence of metastatic disease.  She has a non-functional port-a-cath.  CXR revealed that the tip of the port-a-cath abuts the lateral wall of the mid SVC.  She is scheduled for port removal on 10/08/2015.  She is day 26 of cycle #1 neratinib and Faslodex (began 09/07/2015) on a [facility] clinical trial.  She has had a dramatic response.  She has had diarrhea associated with treatment.  She is on scheduled imodium.  Her weight is down 3 pounds.  Symptomatically, her diarrhea is better controlled.  Counts have improved.  Potassium is normal on supplementation.    Plan: 1.  Review [facility] labs. 2.  Discuss coordinating follow-up in clinic with [facility] schedule and B12 administration. 3.  Discuss management of diarrhea. 4.  Discuss cloric intake. 5.  Cancel B12 appointment on 10/19/2015. 6.  Port-a-cath removal on 10/08/2015. 7.  Follow-up at [facility] on 10/05/2015 for sssessment prior to cycle #2 and Faslodex. 8.  RTC on 11/05/2015 for MD assessment and B12.   [name], MD  10/02/2015, 11:58 AM

## 2015-10-02 NOTE — Progress Notes (Signed)
Patient on clinical trial. Drug not in formulary Neratinib  240 mg daily.

## 2015-10-08 ENCOUNTER — Ambulatory Visit: Payer: PPO | Admitting: Anesthesiology

## 2015-10-08 ENCOUNTER — Ambulatory Visit
Admission: RE | Admit: 2015-10-08 | Discharge: 2015-10-08 | Disposition: A | Discharge status: Home / Self Care | Payer: PPO | Source: Ambulatory Visit | Attending: General Surgery | Admitting: General Surgery

## 2015-10-08 ENCOUNTER — Encounter
Admission: RE | Disposition: A | Discharge status: Home / Self Care | Payer: Self-pay | Source: Ambulatory Visit | Attending: General Surgery

## 2015-10-08 ENCOUNTER — Encounter: Payer: Self-pay | Admitting: Anesthesiology

## 2015-10-08 DIAGNOSIS — I10 Essential (primary) hypertension: Secondary | ICD-10-CM | POA: Diagnosis not present

## 2015-10-08 DIAGNOSIS — Z95828 Presence of other vascular implants and grafts: Secondary | ICD-10-CM | POA: Insufficient documentation

## 2015-10-08 DIAGNOSIS — M1990 Unspecified osteoarthritis, unspecified site: Secondary | ICD-10-CM | POA: Insufficient documentation

## 2015-10-08 DIAGNOSIS — Y828 Other medical devices associated with adverse incidents: Secondary | ICD-10-CM | POA: Insufficient documentation

## 2015-10-08 DIAGNOSIS — Z853 Personal history of malignant neoplasm of breast: Secondary | ICD-10-CM | POA: Diagnosis not present

## 2015-10-08 DIAGNOSIS — I2510 Atherosclerotic heart disease of native coronary artery without angina pectoris: Secondary | ICD-10-CM | POA: Insufficient documentation

## 2015-10-08 DIAGNOSIS — E119 Type 2 diabetes mellitus without complications: Secondary | ICD-10-CM | POA: Diagnosis not present

## 2015-10-08 DIAGNOSIS — E785 Hyperlipidemia, unspecified: Secondary | ICD-10-CM | POA: Insufficient documentation

## 2015-10-08 DIAGNOSIS — T82594A Other mechanical complication of infusion catheter, initial encounter: Secondary | ICD-10-CM | POA: Diagnosis present

## 2015-10-08 DIAGNOSIS — Y929 Unspecified place or not applicable: Secondary | ICD-10-CM | POA: Diagnosis not present

## 2015-10-08 HISTORY — PX: PORT-A-CATH REMOVAL: SHX5289

## 2015-10-08 HISTORY — DX: Headache: R51

## 2015-10-08 HISTORY — DX: Headache, unspecified: R51.9

## 2015-10-08 HISTORY — DX: Cardiac arrhythmia, unspecified: I49.9

## 2015-10-08 LAB — GLUCOSE, CAPILLARY
GLUCOSE-CAPILLARY: 123 mg/dL — AB (ref 65–99)
Glucose-Capillary: 119 mg/dL — ABNORMAL HIGH (ref 65–99)

## 2015-10-08 SURGERY — REMOVAL PORT-A-CATH
Anesthesia: Monitor Anesthesia Care | Wound class: Clean

## 2015-10-08 MED ORDER — CEFAZOLIN SODIUM-DEXTROSE 2-4 GM/100ML-% IV SOLN
INTRAVENOUS | Status: AC
  Filled 2015-10-08: qty 100

## 2015-10-08 MED ORDER — ONDANSETRON HCL 4 MG/2ML IJ SOLN
INTRAMUSCULAR | Status: DC | PRN
  Administered 2015-10-08: 4 mg via INTRAVENOUS

## 2015-10-08 MED ORDER — BUPIVACAINE HCL (PF) 0.25 % IJ SOLN
INTRAMUSCULAR | Status: AC
  Filled 2015-10-08: qty 30

## 2015-10-08 MED ORDER — CEFAZOLIN SODIUM-DEXTROSE 2-4 GM/100ML-% IV SOLN
2.0000 g | INTRAVENOUS | Status: AC
  Administered 2015-10-08: 2 g via INTRAVENOUS

## 2015-10-08 MED ORDER — HYDROCODONE-ACETAMINOPHEN 5-325 MG PO TABS: 1.0000 | ORAL_TABLET | Freq: Four times a day (QID) | ORAL | Status: DC | PRN

## 2015-10-08 MED ORDER — LIDOCAINE HCL (CARDIAC) 20 MG/ML IV SOLN
INTRAVENOUS | Status: DC | PRN
  Administered 2015-10-08: 60 mg via INTRAVENOUS

## 2015-10-08 MED ORDER — LIDOCAINE HCL 1 % IJ SOLN
INTRAMUSCULAR | Status: DC | PRN
  Administered 2015-10-08: 10 mL via SUBCUTANEOUS

## 2015-10-08 MED ORDER — FAMOTIDINE 20 MG PO TABS
20.0000 mg | ORAL_TABLET | Freq: Once | ORAL | Status: AC
  Administered 2015-10-08: 20 mg via ORAL

## 2015-10-08 MED ORDER — FENTANYL CITRATE (PF) 100 MCG/2ML IJ SOLN: 25.0000 ug | INTRAMUSCULAR | Status: DC | PRN

## 2015-10-08 MED ORDER — ONDANSETRON HCL 4 MG/2ML IJ SOLN: 4.0000 mg | Freq: Once | INTRAMUSCULAR | Status: DC | PRN

## 2015-10-08 MED ORDER — CHLORHEXIDINE GLUCONATE 4 % EX LIQD: 1.0000 "application " | Freq: Once | CUTANEOUS | Status: DC

## 2015-10-08 MED ORDER — FAMOTIDINE 20 MG PO TABS
ORAL_TABLET | ORAL | Status: DC
Start: 2015-10-08 — End: 2015-10-08
  Filled 2015-10-08: qty 1

## 2015-10-08 MED ORDER — PROPOFOL 10 MG/ML IV BOLUS
INTRAVENOUS | Status: DC | PRN
  Administered 2015-10-08: 30 mg via INTRAVENOUS

## 2015-10-08 MED ORDER — PROPOFOL 500 MG/50ML IV EMUL
INTRAVENOUS | Status: DC | PRN
  Administered 2015-10-08: 70 ug/kg/min via INTRAVENOUS

## 2015-10-08 MED ORDER — LIDOCAINE HCL (PF) 1 % IJ SOLN
INTRAMUSCULAR | Status: AC
  Filled 2015-10-08: qty 30

## 2015-10-08 MED ORDER — SODIUM CHLORIDE 0.9 % IV SOLN
INTRAVENOUS | Status: DC
Start: 2015-10-08 — End: 2015-10-08
  Administered 2015-10-08: 07:00:00 via INTRAVENOUS

## 2015-10-08 MED ORDER — FENTANYL CITRATE (PF) 100 MCG/2ML IJ SOLN
INTRAMUSCULAR | Status: DC | PRN
  Administered 2015-10-08: 50 ug via INTRAVENOUS

## 2015-10-08 SURGICAL SUPPLY — 21 items
BLADE SURG SZ11 CARB STEEL (BLADE) ×3 IMPLANT
CANISTER SUCT 1200ML W/VALVE (MISCELLANEOUS) ×3 IMPLANT
CHLORAPREP W/TINT 26ML (MISCELLANEOUS) ×3 IMPLANT
COVER LIGHT HANDLE STERIS (MISCELLANEOUS) IMPLANT
ELECT REM PT RETURN 9FT ADLT (ELECTROSURGICAL) ×3
ELECTRODE REM PT RTRN 9FT ADLT (ELECTROSURGICAL) ×1 IMPLANT
GLOVE BIO SURGEON STRL SZ7.5 (GLOVE) ×9 IMPLANT
GLOVE INDICATOR 8.0 STRL GRN (GLOVE) ×3 IMPLANT
GOWN STRL REUS W/ TWL LRG LVL3 (GOWN DISPOSABLE) ×2 IMPLANT
GOWN STRL REUS W/TWL LRG LVL3 (GOWN DISPOSABLE) ×4
KIT RM TURNOVER STRD PROC AR (KITS) ×3 IMPLANT
LABEL OR SOLS (LABEL) ×3 IMPLANT
LIQUID BAND (GAUZE/BANDAGES/DRESSINGS) ×3 IMPLANT
PACK PORT-A-CATH (MISCELLANEOUS) ×3 IMPLANT
SUT MNCRL 4-0 (SUTURE) ×2
SUT MNCRL 4-0 27XMFL (SUTURE) ×1
SUT PROLENE 3 0 SH DA (SUTURE) ×3 IMPLANT
SUT VIC AB 3-0 SH 27 (SUTURE) ×2
SUT VIC AB 3-0 SH 27X BRD (SUTURE) ×1 IMPLANT
SUTURE MNCRL 4-0 27XMF (SUTURE) ×1 IMPLANT
SYRINGE 10CC LL (SYRINGE) ×3 IMPLANT

## 2015-10-08 NOTE — Anesthesia Postprocedure Evaluation (Signed)
Anesthesia Post Note  Patient: Dawn Wiley  Procedure(s) Performed: Procedure(s) (LRB): REMOVAL PORT-A-CATH (N/A)  Patient location during evaluation: PACU Anesthesia Type: General Level of consciousness: awake and alert Pain management: pain level controlled Vital Signs Assessment: post-procedure vital signs reviewed and stable Respiratory status: spontaneous breathing, nonlabored ventilation, respiratory function stable and patient connected to nasal cannula oxygen Cardiovascular status: blood pressure returned to baseline and stable Postop Assessment: no signs of nausea or vomiting Anesthetic complications: no    Last Vitals:  Filed Vitals:   10/08/15 0836 10/08/15 0908  BP: 137/59 131/58  Pulse: 72 71  Temp: 36.6 C   Resp: 18 18    Last Pain:  Filed Vitals:   10/08/15 0909  PainSc: 0-No pain                 Hadlei Stitt S

## 2015-10-08 NOTE — H&P (View-Only) (Signed)
Outpatient Surgical Follow Up  09/19/2015  Dawn Wiley is an 81 y.o. female.   Chief Complaint  Patient presents with  . Follow-up    HPI: 81 year old female who is well-known to the surgery service returns to clinic today for evaluation of a nonfunctional Port-A-Cath. Patient reports that the catheter has been instilled with TPA on multiple encounters that has not been able to draw back blood for the last several days. The catheters only being used for blood draws and is not being used for chemotherapy anymore. Patient desires to have it removed. Patient is receiving a trial medication at [facility] that is due to end in the next 2 weeks. Patient currently denies any fevers, chills, nausea, vomiting, diarrhea, has patient, chest pain, shortness breath. She desires have the catheter removed after she completes her weekly visits to [facility] in 2 weeks.  Past Medical History  Diagnosis Date  . Kidney stones   . Hyperlipidemia   . Coronary artery disease     Coronary calcifications noted on CT scan  . Diabetes mellitus without complication ([place])   . Nephrolithiasis   . Hypertension   . Arthritis   . Anemia   . Tachycardia     Dr. Fletcher Anon, cardiologist  . Cancer [facility])     breast (right)  . PONV (postoperative nausea and vomiting)   . Rash     back  . Breast cancer [facility])     Past Surgical History  Procedure Laterality Date  . Temporomandibular joint surgery    . Cataract extraction      bilateral  . Breast surgery      breast biopsy X 3  . Eye surgery      bilateral cataract extraction  . Mastectomy modified radical Right 08/31/2014    Procedure: MASTECTOMY MODIFIED RADICAL;  Surgeon: Molly Maduro, MD;  Location: [facility] ORS;  Service: General;  Laterality: Right;  . Portacath placement Left 10/20/2014    Procedure: INSERTION PORT-A-CATH;  Surgeon: Marlyce Huge, MD;  Location: [facility] ORS;  Service: General;  Laterality: Left;    Family History  Problem Relation  Age of Onset  . Peripheral Artery Disease Sister     carotid artery stenosis   . Heart disease Sister   . Diabetes Sister     Social History:  reports that she has never smoked. She has never used smokeless tobacco. She reports that she does not drink alcohol or use illicit drugs.  Allergies: No Known Allergies  Medications reviewed.    ROS  A multipoint review of systems was completed. All pertinent positives and negatives are documented within the history of present illness and remainder are negative.  BP 127/71 mmHg  Pulse 102  Temp(Src) 98.1 F (36.7 C) (Oral)  Wt 48.988 kg (108 lb)  Physical Exam  Gen.: No acute distress Neck: Supple and nontender Lymph nodes: No palpable cervical or clavicular lymph nodes Chest: Surgically absent right breast with evidence of breast cancer recurrence to the chest wall and right arm. Chronic indwelling Chemo-Port palpable in the left chest without evidence of infection Heart: Tachycardic Abdomen: Soft and nontender   Results for orders placed or performed in visit on 09/18/15 (from the past 48 hour(s))  Urinalysis complete, with microscopic [facility])     Status: Abnormal   Collection Time: 09/18/15 12:00 PM  Result Value Ref Range   Color, Urine YELLOW (A) YELLOW   APPearance CLEAR (A) CLEAR   Glucose, UA NEGATIVE NEGATIVE mg/dL   Bilirubin Urine  NEGATIVE NEGATIVE   Ketones, ur 1+ (A) NEGATIVE mg/dL   Specific Gravity, Urine 1.021 1.005 - 1.030   Hgb urine dipstick NEGATIVE NEGATIVE   pH 5.0 5.0 - 8.0   Protein, ur 30 (A) NEGATIVE mg/dL   Nitrite NEGATIVE NEGATIVE   Leukocytes, UA TRACE (A) NEGATIVE   RBC / HPF 0-5 0 - 5 RBC/hpf   WBC, UA TOO NUMEROUS TO COUNT 0 - 5 WBC/hpf   Bacteria, UA FEW (A) NONE SEEN   Squamous Epithelial / LPF NONE SEEN NONE SEEN   Mucous PRESENT   Urine culture     Status: None (Preliminary result)   Collection Time: 09/18/15 12:00 PM  Result Value Ref Range   Specimen Description URINE, CLEAN CATCH     Special Requests NONE    Culture      CULTURE REINCUBATED FOR BETTER GROWTH Performed at [facility]    Report Status PENDING   Basic metabolic panel     Status: Abnormal   Collection Time: 09/18/15 12:02 PM  Result Value Ref Range   Sodium 136 135 - 145 mmol/L   Potassium 3.3 (L) 3.5 - 5.1 mmol/L   Chloride 102 101 - 111 mmol/L   CO2 24 22 - 32 mmol/L   Glucose, Bld 150 (H) 65 - 99 mg/dL   BUN 11 6 - 20 mg/dL   Creatinine, Ser 0.49 0.44 - 1.00 mg/dL   Calcium 8.9 8.9 - 10.3 mg/dL   GFR calc non Af Amer >60 >60 mL/min   GFR calc Af Amer >60 >60 mL/min    Comment: (NOTE) The eGFR has been calculated using the CKD EPI equation. This calculation has not been validated in all clinical situations. eGFR's persistently <60 mL/min signify possible Chronic Kidney Disease.    Anion gap 10 5 - 15   Dg Chest 2 View  09/18/2015  CLINICAL DATA:  Evaluate PICC line placement, history of right breast carcinoma EXAM: CHEST  2 VIEW COMPARISON:  Portable chest x-ray of 10/20/2014 FINDINGS: The tip of the left PICC line now appears to point laterally probably against the lateral wall of the superior vena cava in its midportion. Slight elevation of the left hemidiaphragm appears chronic. No infiltrate or effusion is seen. The heart is within upper limits of normal. IMPRESSION: The tip of the Port-A-Cath probably abuts the lateral wall of the mid SVC. No active lung disease. Electronically Signed   By: Ivar Drape M.D.   On: 09/18/2015 13:10    Assessment/Plan:  1. Portacath in place 81 year old female with a nonfunctional Port-A-Cath is not currently being used. She desires to have it removed. Discussed the procedure having removed in the operating room given the length of time that this was in place. The procedure itself was described in detail to include the risks, benefits, alternatives. Patient voiced understanding and desires to proceed. We will plan to take her to the operating room on  the morning of June 12 for removal of her Chemo-Port.     Clayburn Pert, MD FACS General Surgeon  09/19/2015,6:28 PM

## 2015-10-08 NOTE — Brief Op Note (Signed)
10/08/2015  7:50 AM  PATIENT:  Dawn Wiley  81 y.o. female  PRE-OPERATIVE DIAGNOSIS:  port a cath in place  POST-OPERATIVE DIAGNOSIS:  port a cath in place  PROCEDURE:  Procedure(s): REMOVAL PORT-A-CATH (N/A)  SURGEON:  Surgeon(s) and Role:    * Clayburn Pert, MD - Primary  PHYSICIAN ASSISTANT:   ASSISTANTS: none   ANESTHESIA:   IV sedation  EBL:  Total I/O In: 500 [I.V.:500] Out: 3 [Blood:3]  BLOOD ADMINISTERED:none  DRAINS: none   LOCAL MEDICATIONS USED:  MARCAINE   , XYLOCAINE  and Amount: 10 ml  SPECIMEN:  No Specimen  DISPOSITION OF SPECIMEN:  N/A  COUNTS:  YES  TOURNIQUET:  * No tourniquets in log *  DICTATION: .Dragon Dictation  PLAN OF CARE: Discharge to home after PACU  PATIENT DISPOSITION:  PACU - hemodynamically stable.   Delay start of Pharmacological VTE agent (>24hrs) due to surgical blood loss or risk of bleeding: not applicable

## 2015-10-08 NOTE — Op Note (Signed)
   Pre-operative Diagnosis: Nonfunctional Port-A-Cath  Post-operative Diagnosis: Same  Surgeon: Clayburn Pert   Assistants: None  Anesthesia: Monitored Local Anesthesia with Sedation  ASA Class: 3  Surgeon: Clayburn Pert, MD FACS  Anesthesia: Gen. with endotracheal tube  Assistant: None  Procedure Details  The patient was seen again in the Holding Room. The benefits, complications, treatment options, and expected outcomes were discussed with the patient. The risks of bleeding, infection, any of which could require further surgery were reviewed with the patient.   The patient was taken to Operating Room, identified as Dawn Wiley and the procedure verified.  A Time Out was held and the above information confirmed.  Prior to the induction of general anesthesia, antibiotic prophylaxis was administered. VTE prophylaxis was in place. General endotracheal anesthesia was then administered and tolerated well. After the induction, the left upper chest was prepped with Chloraprep and draped in the sterile fashion. The patient was positioned in the supine position.  The area around the Port-A-Cath was localized with a 50-50 picture 1% lidocaine and 0.5% Marcaine plain. The previous incision was then incised with a 15 blade scalpel and using Bovie much cautery setting on the level Port-A-Cath. The subcutaneous port was noticed to be attached with a single 2-0 Prolene suture which was cut with suture scissors. This was the only attachment to the port. Prior to removing the port from its tunnel a 3-0 Vicryl U stitch was placed around the opening of the catheter. The catheter was removed and the U stitch was tied down obliterating the tract. Meticulous hemostasis was ensured with direct elect cautery. The pocket was closed with interrupted 3-0 Vicryl. The skin was reapproximate with a running subcutaneous to clinic for Monocryl. Entire area was re-localized the aforementioned local anesthetic. The  skin was closed with benzoin and Steri-Strips. A dressing of Telfa and Tegaderm was placed over this.  The patient tolerated the procedure well. She was transferred PACU in good condition. There were no immediate, complications and all counts were correct at the end the procedure.  Findings: Port-A-Cath in place   Estimated Blood Loss: 2 mL         Drains: None         Specimens: None          Complications: None                  Condition: Good   Clayburn Pert, MD, FACS

## 2015-10-08 NOTE — Transfer of Care (Signed)
Immediate Anesthesia Transfer of Care Note  Patient: Dawn Wiley  Procedure(s) Performed: Procedure(s): REMOVAL PORT-A-CATH (N/A)  Patient Location: PACU  Anesthesia Type:MAC  Level of Consciousness: awake, alert , oriented and patient cooperative  Airway & Oxygen Therapy: Patient Spontanous Breathing  Post-op Assessment: Report given to RN, Post -op Vital signs reviewed and stable and Patient moving all extremities  Post vital signs: Reviewed and stable  Last Vitals:  Filed Vitals:   10/08/15 0623 10/08/15 0752  BP: 138/71 118/58  Pulse: 82 73  Temp: 36.5 C 36.9 C  Resp: 18 14    Last Pain:  Filed Vitals:   10/08/15 0753  PainSc: 0-No pain         Complications: No apparent anesthesia complications

## 2015-10-08 NOTE — Anesthesia Preprocedure Evaluation (Addendum)
Anesthesia Evaluation  Patient identified by MRN, date of birth, ID band Patient awake    Reviewed: Allergy & Precautions, NPO status , Patient's Chart, lab work & pertinent test results, reviewed documented beta blocker date and time   History of Anesthesia Complications (+) PONV  Airway Mallampati: II  TM Distance: >3 FB     Dental  (+) Chipped   Pulmonary shortness of breath,           Cardiovascular hypertension, Pt. on medications and Pt. on home beta blockers + CAD  + dysrhythmias      Neuro/Psych  Headaches,    GI/Hepatic   Endo/Other  diabetes, Type 2  Renal/GU Renal InsufficiencyRenal disease     Musculoskeletal  (+) Arthritis ,   Abdominal   Peds  Hematology  (+) anemia ,   Anesthesia Other Findings Hx of TMJ repair with limited mouth opening. Anemic with Hb 8.8. CXR OK.  Reproductive/Obstetrics                            Anesthesia Physical Anesthesia Plan  ASA: III  Anesthesia Plan: MAC   Post-op Pain Management:    Induction:   Airway Management Planned:   Additional Equipment:   Intra-op Plan:   Post-operative Plan:   Informed Consent: I have reviewed the patients History and Physical, chart, labs and discussed the procedure including the risks, benefits and alternatives for the proposed anesthesia with the patient or authorized representative who has indicated his/her understanding and acceptance.     Plan Discussed with: CRNA  Anesthesia Plan Comments:         Anesthesia Quick Evaluation

## 2015-10-08 NOTE — Interval H&P Note (Signed)
History and Physical Interval Note:  10/08/2015 6:54 AM  Dawn Wiley  has presented today for surgery, with the diagnosis of port a cath in place  The various methods of treatment have been discussed with the patient and family. After consideration of risks, benefits and other options for treatment, the patient has consented to  Procedure(s): REMOVAL PORT-A-CATH (N/A) as a surgical intervention .  The patient's history has been reviewed, patient examined, no change in status, stable for surgery.  I have reviewed the patient's chart and labs.  Questions were answered to the patient's satisfaction.     Clayburn Pert

## 2015-10-08 NOTE — Discharge Instructions (Signed)
GENERAL POST-OPERATIVE PATIENT INSTRUCTIONS   FOLLOW-UP:  Please make an appointment with your physician in 10 day(s).  Call your physician immediately if you have any fevers greater than 102.5, drainage from you wound that is not clear or looks infected, persistent bleeding, increasing abdominal pain, problems urinating, or persistent nausea/vomiting.    WOUND CARE INSTRUCTIONS:  Keep a dry clean dressing on the wound if there is drainage. The initial bandage may be removed after 24 hours.  Once the wound has quit draining you may leave it open to air.  If clothing rubs against the wound or causes irritation and the wound is not draining you may cover it with a dry dressing during the daytime.  Try to keep the wound dry and avoid ointments on the wound unless directed to do so.  If the wound becomes bright red and painful or starts to drain infected material that is not clear, please contact your physician immediately.  If the wound is mildly pink and has a thick firm ridge underneath it, this is normal, and is referred to as a healing ridge.  This will resolve over the next 4-6 weeks.  DIET:  You may eat any foods that you can tolerate.  It is a good idea to eat a high fiber diet and take in plenty of fluids to prevent constipation.  If you do become constipated you may want to take a mild laxative or take ducolax tablets on a daily basis until your bowel habits are regular.  Constipation can be very uncomfortable, along with straining, after recent surgery.  ACTIVITY:  You are encouraged to cough and deep breath or use your incentive spirometer if you were given one, every 15-30 minutes when awake.  This will help prevent respiratory complications and low grade fevers post-operatively if you had a general anesthetic.  You may want to hug a pillow when coughing and sneezing to add additional support to the surgical area, if you had abdominal or chest surgery, which will decrease pain during these times.   You are encouraged to walk and engage in light activity for the next two weeks.  You should not lift more than 20 pounds during this time frame as it could put you at increased risk for complications.  Twenty pounds is roughly equivalent to a plastic bag of groceries.    MEDICATIONS:  Try to take narcotic medications and anti-inflammatory medications, such as tylenol, ibuprofen, naprosyn, etc., with food.  This will minimize stomach upset from the medication.  Should you develop nausea and vomiting from the pain medication, or develop a rash, please discontinue the medication and contact your physician.  You should not drive, make important decisions, or operate machinery when taking narcotic pain medication.  QUESTIONS:  Please feel free to call your physician or the hospital operator if you have any questions, and they will be glad to assist you.     AMBULATORY SURGERY  DISCHARGE INSTRUCTIONS   1) The drugs that you were given will stay in your system until tomorrow so for the next 24 hours you should not:  A) Drive an automobile B) Make any legal decisions C) Drink any alcoholic beverage   2) You may resume regular meals tomorrow.  Today it is better to start with liquids and gradually work up to solid foods.  You may eat anything you prefer, but it is better to start with liquids, then soup and crackers, and gradually work up to solid foods.   3)  Please notify your doctor immediately if you have any unusual bleeding, trouble breathing, redness and pain at the surgery site, drainage, fever, or pain not relieved by medication. ° ° ° °4) Additional Instructions: ° ° ° ° ° ° ° °Please contact your physician with any problems or Same Day Surgery at [phone], Monday through Friday 6 am to 4 pm, or [facility] number at [phone]. ° °

## 2015-10-12 ENCOUNTER — Encounter: Payer: Self-pay | Admitting: General Surgery

## 2015-10-18 ENCOUNTER — Encounter: Payer: PPO | Admitting: Surgery

## 2015-10-19 ENCOUNTER — Encounter: Payer: PPO | Admitting: Surgery

## 2015-10-19 ENCOUNTER — Ambulatory Visit: Payer: PPO

## 2015-10-22 ENCOUNTER — Other Ambulatory Visit: Payer: Self-pay | Admitting: *Deleted

## 2015-10-22 MED ORDER — POTASSIUM CHLORIDE CRYS ER 10 MEQ PO TBCR: 10.0000 meq | EXTENDED_RELEASE_TABLET | Freq: Every day | ORAL | Status: DC

## 2015-10-22 MED ORDER — TEMAZEPAM 15 MG PO CAPS: ORAL_CAPSULE | ORAL | Status: DC

## 2015-10-22 MED ORDER — HYDROCODONE-ACETAMINOPHEN 7.5-325 MG PO TABS: 1.0000 | ORAL_TABLET | Freq: Four times a day (QID) | ORAL | Status: DC | PRN

## 2015-10-22 NOTE — Telephone Encounter (Signed)
   Ref Range 74mo ago    Potassium 3.5 - 5.1 mmol/L 4.0        She is asking if she needs to continue potassium?

## 2015-10-25 ENCOUNTER — Other Ambulatory Visit: Payer: Self-pay

## 2015-10-25 ENCOUNTER — Encounter: Payer: Self-pay | Admitting: Surgery

## 2015-10-25 ENCOUNTER — Ambulatory Visit (INDEPENDENT_AMBULATORY_CARE_PROVIDER_SITE_OTHER): Payer: PPO | Admitting: Surgery

## 2015-10-25 VITALS — BP 126/71 | HR 72 | Temp 98.3°F | Wt 104.0 lb

## 2015-10-25 DIAGNOSIS — IMO0001 Reserved for inherently not codable concepts without codable children: Secondary | ICD-10-CM

## 2015-10-25 DIAGNOSIS — C792 Secondary malignant neoplasm of skin: Secondary | ICD-10-CM

## 2015-10-25 DIAGNOSIS — C50911 Malignant neoplasm of unspecified site of right female breast: Secondary | ICD-10-CM

## 2015-10-25 NOTE — Progress Notes (Signed)
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 Patient has been through right mastectomy and through her radiation and chemotherapy. Patient has recently been transferred to [facility] for skin metastasis has been taking some Herceptin as well as a new by mouth drug. Patient states that the skin areas are better on the by mouth drug however now there certainly come back again. Patient recently had her port removed due to a kink at the end of the line. Patient is not had any pain over this site has done well since the removal. Patient states that right now she's not sure that she will need anymore IV infusions but will come back if she does need further Herceptin tx.   Filed Vitals:   10/25/15 1507  BP: 126/71  Pulse: 72  Temp: 98.3 F (36.8 C)   PE: gen: NAD  Chest wall:  Left sided port removal site Healing well no erythema or drainage. Right side of chest with multiple skin metastases in both the chest wall and in the axilla and around to the back well-healed surgical incision from mastectomy scar   A/P:  Patient healing well after port removal. Patient is unsure she's getting continue needing IV medication treatment or if [facility] is going to continue with the by mouth clinical trial. If she does need of further port placement will have her call us and reschedule an appointment.

## 2015-11-02 ENCOUNTER — Encounter: Payer: PPO | Admitting: General Surgery

## 2015-11-05 ENCOUNTER — Other Ambulatory Visit: Payer: Self-pay | Admitting: *Deleted

## 2015-11-05 ENCOUNTER — Inpatient Hospital Stay: Payer: PPO

## 2015-11-05 ENCOUNTER — Other Ambulatory Visit: Payer: Self-pay | Admitting: Hematology and Oncology

## 2015-11-05 ENCOUNTER — Inpatient Hospital Stay: Payer: PPO | Attending: Hematology and Oncology | Admitting: Hematology and Oncology

## 2015-11-05 ENCOUNTER — Encounter: Payer: Self-pay | Admitting: Hematology and Oncology

## 2015-11-05 VITALS — BP 136/73 | HR 79 | Temp 97.0°F | Resp 18 | Wt 106.9 lb

## 2015-11-05 DIAGNOSIS — Z7982 Long term (current) use of aspirin: Secondary | ICD-10-CM | POA: Diagnosis not present

## 2015-11-05 DIAGNOSIS — E785 Hyperlipidemia, unspecified: Secondary | ICD-10-CM | POA: Insufficient documentation

## 2015-11-05 DIAGNOSIS — R000 Tachycardia, unspecified: Secondary | ICD-10-CM | POA: Diagnosis not present

## 2015-11-05 DIAGNOSIS — IMO0001 Reserved for inherently not codable concepts without codable children: Secondary | ICD-10-CM

## 2015-11-05 DIAGNOSIS — Z87442 Personal history of urinary calculi: Secondary | ICD-10-CM | POA: Insufficient documentation

## 2015-11-05 DIAGNOSIS — C50911 Malignant neoplasm of unspecified site of right female breast: Secondary | ICD-10-CM | POA: Insufficient documentation

## 2015-11-05 DIAGNOSIS — M129 Arthropathy, unspecified: Secondary | ICD-10-CM | POA: Diagnosis not present

## 2015-11-05 DIAGNOSIS — R439 Unspecified disturbances of smell and taste: Secondary | ICD-10-CM | POA: Insufficient documentation

## 2015-11-05 DIAGNOSIS — E119 Type 2 diabetes mellitus without complications: Secondary | ICD-10-CM | POA: Diagnosis not present

## 2015-11-05 DIAGNOSIS — C7989 Secondary malignant neoplasm of other specified sites: Secondary | ICD-10-CM | POA: Diagnosis not present

## 2015-11-05 DIAGNOSIS — I2510 Atherosclerotic heart disease of native coronary artery without angina pectoris: Secondary | ICD-10-CM | POA: Diagnosis not present

## 2015-11-05 DIAGNOSIS — A171 Meningeal tuberculoma: Secondary | ICD-10-CM | POA: Diagnosis not present

## 2015-11-05 DIAGNOSIS — Z7984 Long term (current) use of oral hypoglycemic drugs: Secondary | ICD-10-CM | POA: Insufficient documentation

## 2015-11-05 DIAGNOSIS — I10 Essential (primary) hypertension: Secondary | ICD-10-CM | POA: Diagnosis not present

## 2015-11-05 DIAGNOSIS — G4700 Insomnia, unspecified: Secondary | ICD-10-CM | POA: Insufficient documentation

## 2015-11-05 DIAGNOSIS — Z171 Estrogen receptor negative status [ER-]: Secondary | ICD-10-CM | POA: Diagnosis not present

## 2015-11-05 DIAGNOSIS — Z853 Personal history of malignant neoplasm of breast: Secondary | ICD-10-CM

## 2015-11-05 DIAGNOSIS — R21 Rash and other nonspecific skin eruption: Secondary | ICD-10-CM | POA: Diagnosis not present

## 2015-11-05 DIAGNOSIS — R197 Diarrhea, unspecified: Secondary | ICD-10-CM | POA: Diagnosis not present

## 2015-11-05 DIAGNOSIS — Z923 Personal history of irradiation: Secondary | ICD-10-CM | POA: Diagnosis not present

## 2015-11-05 DIAGNOSIS — E538 Deficiency of other specified B group vitamins: Secondary | ICD-10-CM | POA: Insufficient documentation

## 2015-11-05 DIAGNOSIS — D509 Iron deficiency anemia, unspecified: Secondary | ICD-10-CM

## 2015-11-05 DIAGNOSIS — E876 Hypokalemia: Secondary | ICD-10-CM

## 2015-11-05 DIAGNOSIS — C779 Secondary and unspecified malignant neoplasm of lymph node, unspecified: Secondary | ICD-10-CM | POA: Insufficient documentation

## 2015-11-05 DIAGNOSIS — Z9011 Acquired absence of right breast and nipple: Secondary | ICD-10-CM | POA: Diagnosis not present

## 2015-11-05 DIAGNOSIS — Z79899 Other long term (current) drug therapy: Secondary | ICD-10-CM

## 2015-11-05 MED ORDER — POTASSIUM CHLORIDE CRYS ER 10 MEQ PO TBCR: 10.0000 meq | EXTENDED_RELEASE_TABLET | Freq: Every day | ORAL | Status: AC

## 2015-11-05 MED ORDER — TEMAZEPAM 15 MG PO CAPS: ORAL_CAPSULE | ORAL | Status: DC

## 2015-11-05 MED ORDER — HYDROCODONE-ACETAMINOPHEN 7.5-325 MG PO TABS: 1.0000 | ORAL_TABLET | Freq: Four times a day (QID) | ORAL | Status: DC | PRN

## 2015-11-05 MED ORDER — CYANOCOBALAMIN 1000 MCG/ML IJ SOLN
1000.0000 ug | Freq: Once | INTRAMUSCULAR | Status: AC
  Administered 2015-11-05: 1000 ug via INTRAMUSCULAR
  Filled 2015-11-05: qty 1

## 2015-11-05 NOTE — Progress Notes (Signed)
[facility] day:  11/05/2015   Chief Complaint: Dawn Wiley is an 81 y.o. female with stage IV right breast cancer who is seen for monthly assessment on neratinib and Faslodex on a [facility] clinical trial with recent addition of weekly Herceptin.   HPI: The patient was last seen in the medical oncology clinic on 10/02/2015.  At that time, she was doing well on neratinib and Faslodex. She had a dramatic response to therapy.  Diarrhea was better controlled. Counts had improved. Potassium was normal on supplementation.   During the interim, she has had a setback.  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 Herceptin was added 2 weeks ago (10/25/2015). Her second weekly Herceptin treatment was on 11/01/2015.  She notes that diarrhea is fairly well controlled.  She takes 6 neratinib pills and 1 diarrhea pill a day. She notes that food has no taste.  She denies any nausea or vomiting.  She is fatigued.   [facility] labs on 10/18/2015 revealed a hematocrit of 32.9, hemoglobin 9.6, MCV 93, platelets 252,000, white count 5400 with an ANC of 3600. Comprehensive metabolic panel included a sodium of 137, potassium 4.2, BUN 16, creatinine 0.5, and albumen 3.9.  Liver function tests were normal.    Past Medical History  Diagnosis Date  . Kidney stones   . Hyperlipidemia   . Coronary artery disease     Coronary calcifications noted on CT scan  . Diabetes mellitus without complication ([place])   . Nephrolithiasis   . Hypertension   . Arthritis   . Anemia   . Tachycardia     Dr. Fletcher Anon, cardiologist  . Cancer [place])     breast (right)  . PONV (postoperative nausea and vomiting)   . Rash     back  . Breast cancer ([place])   . Dysrhythmia     TACHYCARDIA-METOPROLOL CONTROLS THIS WELL  . Headache     H/O MIGRAINES    Past Surgical History  Procedure Laterality Date  . Temporomandibular joint surgery    . Cataract extraction      bilateral  . Breast surgery     breast biopsy X 3  . Eye surgery      bilateral cataract extraction  . Mastectomy modified radical Right 08/31/2014    Procedure: MASTECTOMY MODIFIED RADICAL;  Surgeon: Molly Maduro, MD;  Location: [facility] ORS;  Service: General;  Laterality: Right;  . Portacath placement Left 10/20/2014    Procedure: INSERTION PORT-A-CATH;  Surgeon: Marlyce Huge, MD;  Location: [facility] ORS;  Service: General;  Laterality: Left;  . Port-a-cath removal N/A 10/08/2015    Procedure: REMOVAL PORT-A-CATH;  Surgeon: Clayburn Pert, MD;  Location: [facility] ORS;  Service: General;  Laterality: N/A;    Family History  Problem Relation Age of Onset  . Peripheral Artery Disease Sister     carotid artery stenosis   . Heart disease Sister   . Diabetes Sister     Social History:  reports that she has never smoked. She has never used smokeless tobacco. She reports that she does not drink alcohol or use illicit drugs.  The patient is alone today.  Her daughter's phone number is [phone].    Allergies: No Known Allergies  Current Medications: Current Outpatient Prescriptions  Medication Sig Dispense Refill  . acetaminophen (TYLENOL) 500 MG tablet Take 500 mg by mouth every 6 (six) hours as needed for mild pain or moderate pain.     [name] Kitchen amLODipine (NORVASC) 5 MG  tablet Take 1 tablet (5 mg total) by mouth daily. (Patient taking differently: Take 5 mg by mouth every morning. ) 90 tablet 1  . aspirin 81 MG tablet Take 81 mg by mouth every other day.    . Calcium Carbonate (CALTRATE 600 PO) Take 1,200 mg by mouth daily.     . cholecalciferol (VITAMIN D) 400 UNITS TABS tablet Take 800 Units by mouth.    . Cyanocobalamin (VITAMIN B 12 PO) Take 1 tablet by mouth every morning.    . Cyanocobalamin (VITAMIN B-12 IJ) Inject as directed every 30 (thirty) days.     . diphenhydrAMINE (BENADRYL) 25 mg capsule Take 25 mg by mouth every 6 (six) hours as needed for allergies.    . ferrous sulfate 325 (65 FE) MG tablet Take 325 mg  by mouth daily with breakfast.     . glucose blood test strip Test blood sugars every morning and after meals DX E 11.19 100 each 12  . HYDROcodone-acetaminophen (NORCO) 7.5-325 MG tablet Take 1 tablet by mouth every 6 (six) hours as needed for moderate pain. 60 tablet 0  . Investigational - Study Medication Take 6 tablets by mouth every morning. Additional Study Details: NERATINIV 240 MG Q AM-PT IN CLINICAL STUDY AT [facility]    . Lancets (SAFETY LANCET 28G/PRESSURE ACT) MISC 1 each by Other route as needed (to test glucose levels). 50 each 12  . loperamide (IMODIUM) 2 MG capsule     . metFORMIN (GLUCOPHAGE) 500 MG tablet Take 1 tablet (500 mg total) by mouth 2 (two) times daily with a meal. 180 tablet 1  . metoprolol tartrate (LOPRESSOR) 25 MG tablet Take 1 tablet (25 mg total) by mouth 2 (two) times daily. 180 tablet 2  . potassium chloride (K-DUR) 10 MEQ tablet Take 1 tablet by mouth.    . potassium chloride (K-DUR,KLOR-CON) 10 MEQ tablet Take 1 tablet (10 mEq total) by mouth daily. 30 tablet 2  . temazepam (RESTORIL) 15 MG capsule 1 tablet at night as needed for sleep 30 capsule 1  . magic mouthwash SOLN Take 5 mLs by mouth 4 (four) times daily. (Patient not taking: Reported on 11/05/2015) 200 mL 1   No current facility-administered medications for this visit.   Facility-Administered Medications Ordered in Other Visits  Medication Dose Route Frequency Provider Last Rate Last Dose  . sodium chloride 0.9 % injection 10 mL  10 mL Intravenous PRN [name], MD      . sodium chloride 0.9 % injection 10 mL  10 mL Intravenous PRN [name], MD   10 mL at 03/21/15 0849  . sodium chloride 0.9 % injection 10 mL  10 mL Intracatheter PRN [name], MD   10 mL at 04/25/15 0834  . sodium chloride flush (NS) 0.9 % injection 10 mL  10 mL Intravenous PRN [name], MD   10 mL at 09/18/15 1423    Review of Systems:  GENERAL: Feels "ok". No fevers or sweats.  Weight up 1 pound. PERFORMANCE STATUS (ECOG): 1 HEENT: No visual changes, runny nose, sore throat, or tenderness. Lungs: No shortness of breath or cough. No hemoptysis. Cardiac: No chest pain, palpitations, orthopnea, or PND. GI: Diarrhea, controlled.  No nausea, vomiting, constipation, melena or hematochezia. GU: No urgency, frequency, dysuria, or hematuria. Musculoskeletal: No back pain. No joint pain. No muscle tenderness. Extremities: No pain or swelling. Skin: Chest wall lesions,growing. Neuro: No headache, numbness or weakness, balance or coordination issues.  Endocrine: Diabetes.  No thyroid issues, hot flashes or night sweats. Psych: No mood changes.  Denies depression or anxiety.  Trouble sleeping. Pain: No focal pain. Review of systems: All other systems reviewed and found to be negative.  Physical Exam: Blood pressure 136/73, pulse 79, temperature 97 F (36.1 C), resp. rate 18, weight 106 lb 14.8 oz (48.5 kg). GENERAL: Thin fatigued appearing elderly woman sitting comfortably in the exam room in no acute distress. MENTAL STATUS: Alert and oriented to person, place and time. HEAD: Short gray wig. Normocephalic, atraumatic, face symmetric, no Cushingoid features. EYES: Blue eyes. Pupils equal round and reactive to light and accomodation.  No conjunctivitis or scleral icterus. RESPIRATORY:  Clear to auscultation without rales, wheezes or rhonchi. CARDIOVASCULAR:  Regular rate and rhythm without murmur, rub or gallop. BREAST: Right mastectomy. Enlarging right chest wall medial lesion measuring 2 x 1.5 cm, 2.5 x 3 cm, 3 x 5 cm.  Nodular area 6 x 5.5 cm medially involving some of left breast.  Enlarging right lateral back 10 x 13 cm.  Extent of disease 39.5 cm x 22 cm. ABDOMEN:  Soft, non-tender, with active bowel sounds, and no hepatosplenomegaly.  No masses. SKIN: Chest wall lesions as above.  Upper right arm 7 x 4 cm nodular lesion. EXTREMITIES: No edema,  no skin discoloration or tenderness. No palpable cords. LYMPH NODES: No palpable axillary adenopathy  NEUROLOGICAL: Unremarkable. PSYCH: Appropriate.   No visits with results within 3 Day(s) from this visit. Latest known visit with results is:  Admission on 10/08/2015, Discharged on 10/08/2015  Component Date Value Ref Range Status  . Glucose-Capillary 10/08/2015 119* 65 - 99 mg/dL Final  . Glucose-Capillary 10/08/2015 123* 65 - 99 mg/dL Final    Assessment:  Dawn Wiley is an 81 y.o. female with stage IV breast cancer.  She presented with stage IIIC right breast cancer status post mastectomy and axillary lymph node dissection on 08/31/2014. 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 Carcinoma involved skeletal muscle and dermal lymphatics. Tumor was less than 0.5 mm from the deep margin. Thirteen of 15 lymph nodes were involved. The largest metastatic focus was 2 cm. Tumor is triple negative (ER negative, PR negative, and HER-2/neu negative).  Androgen receptor and PDL-1 testing was negative.  Pathologic stage was T3N3aMx.  PET scan on 09/21/2014 revealed postoperative changes in the right chest with no suspicious findings or residual tumor. Bone scan on 10/11/2014 revealed no evidence of metastatic disease.  Bone density study on 10/02/2014 revealed a T score of -4.7 in the forearm consistent with osteoporosis. T-score was less than 2.5 in the spine or hip. Echo on 10/02/2014 revealed ejection fraction of 60-65%.  Echo on 06/27/2015 revealed EF of 55-60%.  She has iron deficiency anemia. Labs on 09/19/2014 revealed a hematocrit of 29.9, hemoglobin 8.7, MCV 69.7, ferritin 9, and TIBC 616. B12 was low (265) with a prior history of B12 deficiency and need for supplementation (stopped 06/2014). She began B12 on 10/12/2014 (last 09/19/2015).  Folate was normal.  Her diet is modest. She has never had a colonoscopy. She denies  any melena or hematochezia.  She takes 1 iron pill a day (additional iron causes diarrhea).  Ferritin was 116 on 04/25/2015.  Chest wall biopsy x 3 (lateral, medial, and middle sites) on 11/08/2014 revealed dermal lymphatic involvement by invasive mammary carcinoma with micropapillary features.  Right arm punch biopsy on 06/28/2015 confirmed breast cancer.  She has aggressive disease with  growth despite several chemotherapeutic agents.  She received 5 weeks of Taxol (11/03/2014 - 12/01/2014).  She received 1 week of adriamycin (12/14/2014).  She received 1 cycle of carboplatin (12/28/2014).  After carboplatin, disease appeared to stabilize, but with 2 weeks off of therapy, her disease grew again (medial lesion larger and weaping).   She received 3 cycles of carboplatin and Taxol (01/18/2015 - 03/02/2015).  Chest wall lesions initially decreased then began to grow.  She received 2 cycles of Adriamycin and Cytoxan (03/26/2015 - 04/11/2015) with Neulasta support.  ANC nadir was 200 on day 9 (04/03/2015).  Cycle #2 was complicated by mouth sores, decreased oral intake, and weight loss. Chest wall disease began to grow before cycle #3 could be administered.  PET scan on 04/26/2015 revealed extensive recurrence about the right chest.  The far lateral component measured 4.2 x 2.2 cm (S.U.V.13.5).  A portion superficial to the right-side of the sternum measured 5.2 x 2.2 cm (S.U.V. 9.1).  There was diffuse more inferior and lateral chest wall soft tissue thickening and hypermetabolism.    CA27.29 was 118.4 on 05/02/2015, 92.9 on 06/11/2015, 38.5 on 07/03/2015, and 27.4 on 07/24/2015.  She received 5 weeks of concurrent carboplatin and radiation (05/14/2015 - 06/11/2015).  Radiation completed on 06/15/2015.  Cutaneous lesions in the field of radiation improved dramatically.    [facility] testing on 05/22/2015 returned genomic alterations of ERBB2 (S310F and V777L), FGFR1 (amplification), CDH1 (E490*),  CDKN2A (p16INK4a H83Y and p14ARF A97V), MYST3 (amplification), TERT (promotor -124C>T), TP53  (Y220C), and ZNF703 (amplification).  FDA approved therapies included ado-trastuzumab, lapatinib, pertuzumab, trastuzumab for ERBB2 as well as afatinib (approved in other tumor type) and pazopanib and ponatinib (approved in other tumor types for FGFR1).  She received 8 weeks of Herceptin (07/03/2015 - 08/21/2015).  Lesions continued to progress.  PET scan on 08/31/2015 revealed the interval development of multifocal hypermetabolic skin thickening and masses, representing new cutaneous metastases. Interval resolution of superior right anterior chest wall subcutaneous lesion.  There was interval development of multiple hypermetabolic left axillary lymph nodes compatible with metastasis. There was interval resolution of right axillary hypermetabolic lymph nodes.   Bone scan on 08/31/2015 revealed no evidence of metastatic disease.  She has a non-functional port-a-cath.  CXR revealed that the tip of the port-a-cath abuts the lateral wall of the mid SVC.  She is scheduled for port removal on 10/08/2015.  She is currently receiving neratinib and Faslodex (began 09/07/2015) on a [facility] clinical trial.  She initially had a dramatic response.  She has had rapid growth since her last visit.  Weekly Herceptin was added on 10/26/2015 (last 11/01/2015).    Symptomatically, her chest wall lesions have rapidly grown in the past 2-3 weeks.  Diarrhea is controlled.  Her weight is up 1 pound.  Counts are good.  Potassium is normal on supplementation.    Plan: 1.  Review interim events and [facility] labs.  Support was provided. 2.  Discuss coordinating care with Dr Beverley Fiedler for possible Herceptin locally (given weekly with daily oral neratinib). 3.  B12 today and monthly. 4.  Continue electrolyte supplementation. 5.  Rx: potassium chloride 10 meq po q day (dis: # 30). 6.  Rx: hydrocodone 5 mg/acetaminophen 325 mg 1 tablet every 6  hours prn pain (dis : #30). 7.  Rx:  temazepam 15 mg po hs prn insomnia (dis: #30). 8.  RTC in 1 month for MD assessment, +/- labs, and B12.   [name], MD  11/05/2015,  10:41 AM

## 2015-11-05 NOTE — Progress Notes (Signed)
Patient states she is doing better this week. She had diarrhea last week. Food has no taste. Has mild fatigue. Patient on clinical trial at [facility]. Taking Herceptin every 2 weeks, faslodex and neratinib. Denies dizziness. No nausea or vomiting.

## 2015-11-07 NOTE — Telephone Encounter (Signed)
Pt was given a rx on her July appt with md.  I called and spoke to pt. And she was seen 7/10 and I told her that I just wanted to make sure that it was taken care of since I have been on vacation for couple of weeks. She was taken care of

## 2015-11-13 ENCOUNTER — Ambulatory Visit: Payer: PPO

## 2015-11-19 ENCOUNTER — Ambulatory Visit: Payer: PPO

## 2015-11-26 ENCOUNTER — Other Ambulatory Visit: Payer: Self-pay | Admitting: Hematology and Oncology

## 2015-11-26 ENCOUNTER — Telehealth: Payer: Self-pay

## 2015-11-26 NOTE — Telephone Encounter (Signed)
Patient has progressed and is currently not meeting requirements for protocol with [facility] Study.  Patient to have appointment on Friday for lad, MD visit, and new treatment plan

## 2015-11-27 ENCOUNTER — Other Ambulatory Visit: Payer: Self-pay | Admitting: Hematology and Oncology

## 2015-11-27 ENCOUNTER — Telehealth: Payer: Self-pay | Admitting: *Deleted

## 2015-11-27 ENCOUNTER — Encounter: Payer: Self-pay | Admitting: Hematology and Oncology

## 2015-11-27 NOTE — Telephone Encounter (Signed)
Called [facility] clinical trials for breast and spoke to April .  The main number is [phone] to clinical trial office.  I needed to have the last dose of herceptin pt rcvd at [facility].  We are following guidelines by Dr. Juanita Craver to cont. Her on hercpetin and what dose it was.  April called me back and states that it was 7/6 and she got 210 mg dose.  It was a 4mg  /kg dosing.  This info given to Dr. Mike Gip

## 2015-11-27 NOTE — Progress Notes (Signed)
Dawn Wiley is a 81 y.o. female with stage IV right breast cancer with progression on the neratinib and Faslodex [facility] clinical trial.  She has received several regimens to date.  She previously progressed on Adriamycin and Cytoxan (AC), Taxol, carboplatin, single agent Herceptin and most recently on Neratinib and Faslodex.  I have reviewed the potential treatment options with Dr. Lucile Crater at [facility].  Given her [facility] One testing, a next reasonable option is Navelbine and Herceptin.  Her disease is predominantly skin/chest wall.  If progression is documented on Navelbine and Herceptin, I would pursue pazopanib (Votrient) as her [facility] One testing notes the presence of EGFR amplification.    The patient was contacted for reassessment in clinic and initiation of treatment (Herceptin an Navelbine).   [name], MD  11/27/2015, 3:25 PM

## 2015-11-28 ENCOUNTER — Ambulatory Visit (INDEPENDENT_AMBULATORY_CARE_PROVIDER_SITE_OTHER): Payer: PPO | Admitting: Internal Medicine

## 2015-11-28 ENCOUNTER — Encounter: Payer: Self-pay | Admitting: *Deleted

## 2015-11-28 VITALS — BP 118/58 | HR 89 | Temp 97.6°F

## 2015-11-28 DIAGNOSIS — C792 Secondary malignant neoplasm of skin: Secondary | ICD-10-CM

## 2015-11-28 DIAGNOSIS — N3000 Acute cystitis without hematuria: Secondary | ICD-10-CM | POA: Diagnosis not present

## 2015-11-28 DIAGNOSIS — R350 Frequency of micturition: Secondary | ICD-10-CM | POA: Diagnosis not present

## 2015-11-28 DIAGNOSIS — I10 Essential (primary) hypertension: Secondary | ICD-10-CM | POA: Diagnosis not present

## 2015-11-28 DIAGNOSIS — E119 Type 2 diabetes mellitus without complications: Secondary | ICD-10-CM

## 2015-11-28 DIAGNOSIS — IMO0001 Reserved for inherently not codable concepts without codable children: Secondary | ICD-10-CM

## 2015-11-28 LAB — URINALYSIS, ROUTINE W REFLEX MICROSCOPIC
BILIRUBIN URINE: NEGATIVE
Ketones, ur: NEGATIVE
NITRITE: POSITIVE — AB
PH: 5.5 (ref 5.0–8.0)
SPECIFIC GRAVITY, URINE: 1.025 (ref 1.000–1.030)
TOTAL PROTEIN, URINE-UPE24: 30 — AB
Urine Glucose: NEGATIVE
Urobilinogen, UA: 0.2 (ref 0.0–1.0)

## 2015-11-28 LAB — COMPREHENSIVE METABOLIC PANEL
ALK PHOS: 67 U/L (ref 39–117)
ALT: 8 U/L (ref 0–35)
AST: 18 U/L (ref 0–37)
Albumin: 3.8 g/dL (ref 3.5–5.2)
BILIRUBIN TOTAL: 0.4 mg/dL (ref 0.2–1.2)
BUN: 13 mg/dL (ref 6–23)
CO2: 26 meq/L (ref 19–32)
CREATININE: 0.57 mg/dL (ref 0.40–1.20)
Calcium: 9.4 mg/dL (ref 8.4–10.5)
Chloride: 103 mEq/L (ref 96–112)
GFR: 108.62 mL/min (ref >60.00)
GLUCOSE: 130 mg/dL — AB (ref 70–99)
Potassium: 4 mEq/L (ref 3.5–5.1)
SODIUM: 138 meq/L (ref 135–145)
TOTAL PROTEIN: 6.8 g/dL (ref 6.0–8.3)

## 2015-11-28 LAB — POCT URINALYSIS DIPSTICK
Bilirubin, UA: POSITIVE
GLUCOSE UA: NEGATIVE
Ketones, UA: POSITIVE
NITRITE UA: POSITIVE
PROTEIN UA: POSITIVE
RBC UA: POSITIVE
Spec Grav, UA: >=1.03
UROBILINOGEN UA: 0.2
pH, UA: 5.5

## 2015-11-28 LAB — MICROALBUMIN / CREATININE URINE RATIO
CREATININE, U: 120.2 mg/dL
MICROALB/CREAT RATIO: 13.1 mg/g (ref 0.0–30.0)
Microalb, Ur: 15.8 mg/dL — ABNORMAL HIGH (ref 0.0–1.9)

## 2015-11-28 LAB — HEMOGLOBIN A1C: HEMOGLOBIN A1C: 5.6 % (ref 4.6–6.5)

## 2015-11-28 MED ORDER — CIPROFLOXACIN HCL 250 MG PO TABS: 250.0000 mg | ORAL_TABLET | Freq: Two times a day (BID) | ORAL | 0 refills | Status: DC

## 2015-11-28 NOTE — Patient Instructions (Addendum)
I am prescribing ciprofloxacin for your UTI.    We will call you to let you know the culture results   If your A1c today is < 7.0, you can stop the metformin and stop checking your blood sugars every day

## 2015-11-28 NOTE — Progress Notes (Signed)
Subjective:  Patient ID: Dawn Wiley, female    DOB: 08-02-1934  Age: 81 y.o. MRN: [id number]  CC: The primary encounter diagnosis was Urinary frequency. Diagnoses of Diabetes mellitus without complication ([place]), Acute recurrent cystitis, Essential hypertension, and Skin metastases ([place]) were also pertinent to this visit.  HPI Dawn Wiley presents for EVALUATION OF URINARY FREQUENCY AND BURNING . And follow up on type 2 DM  Managed with metformin, and inflammatory  Breast cancer. [name] Kitchen    UTI:  She was teated one month ago  Empirically with amoxicillin by her oncologist and symptoms resolved for two weeks .  However her chemotherapy treatment has resulted in recurrent loose stools    Stage IV triple negative  Breast Cancer: receiving neratinib, Faslodex awith dramatic response followed by a setback and Herceptin was added Jun 29  .  There has been no evidence of mets to liver , spine or brain by bone scan 2016   Her sense of taste has returned  since stopping neratinib .  Getting ready to start a new chemo on Friday  Lab Results  Component Value Date   HGBA1C 5.6 11/28/2015   She has continually deferred  Her diabetic eye exam. She continues to take metformin daily      Outpatient Medications Prior to Visit  Medication Sig Dispense Refill  . acetaminophen (TYLENOL) 500 MG tablet Take 500 mg by mouth every 6 (six) hours as needed for mild pain or moderate pain.     [name] Kitchen amLODipine (NORVASC) 5 MG tablet Take 1 tablet (5 mg total) by mouth daily. (Patient taking differently: Take 5 mg by mouth every morning. ) 90 tablet 1  . aspirin 81 MG tablet Take 81 mg by mouth every other day.    . Calcium Carbonate (CALTRATE 600 PO) Take 1,200 mg by mouth daily.     . cholecalciferol (VITAMIN D) 400 UNITS TABS tablet Take 800 Units by mouth.    . Cyanocobalamin (VITAMIN B 12 PO) Take 1 tablet by mouth every morning.    . Cyanocobalamin (VITAMIN B-12 IJ) Inject as directed every 30 (thirty)  days.     . diphenhydrAMINE (BENADRYL) 25 mg capsule Take 25 mg by mouth every 6 (six) hours as needed for allergies.    . ferrous sulfate 325 (65 FE) MG tablet Take 325 mg by mouth daily with breakfast.     . glucose blood test strip Test blood sugars every morning and after meals DX E 11.19 100 each 12  . HYDROcodone-acetaminophen (NORCO) 7.5-325 MG tablet Take 1 tablet by mouth every 6 (six) hours as needed for moderate pain. 60 tablet 0  . Lancets (SAFETY LANCET 28G/PRESSURE ACT) MISC 1 each by Other route as needed (to test glucose levels). 50 each 12  . metoprolol tartrate (LOPRESSOR) 25 MG tablet Take 1 tablet (25 mg total) by mouth 2 (two) times daily. 180 tablet 2  . potassium chloride (K-DUR) 10 MEQ tablet Take 1 tablet by mouth.    . potassium chloride (K-DUR,KLOR-CON) 10 MEQ tablet Take 1 tablet (10 mEq total) by mouth daily. 30 tablet 2  . temazepam (RESTORIL) 15 MG capsule 1 tablet at night as needed for sleep 30 capsule 1  . Investigational - Study Medication Take 6 tablets by mouth every morning. Additional Study Details: NERATINIV 240 MG Q AM-PT IN CLINICAL STUDY AT [facility]    . loperamide (IMODIUM) 2 MG capsule     . magic mouthwash SOLN Take 5  mLs by mouth 4 (four) times daily. (Patient not taking: Reported on 11/30/2015) 200 mL 1  . metFORMIN (GLUCOPHAGE) 500 MG tablet Take 1 tablet (500 mg total) by mouth 2 (two) times daily with a meal. 180 tablet 1   Facility-Administered Medications Prior to Visit  Medication Dose Route Frequency Provider Last Rate Last Dose  . sodium chloride 0.9 % injection 10 mL  10 mL Intravenous PRN [name], MD      . sodium chloride 0.9 % injection 10 mL  10 mL Intravenous PRN [name], MD   10 mL at 03/21/15 0849  . sodium chloride 0.9 % injection 10 mL  10 mL Intracatheter PRN [name], MD   10 mL at 04/25/15 0834  . sodium chloride flush (NS) 0.9 % injection 10 mL  10 mL Intravenous PRN [name], MD    10 mL at 09/18/15 1423    Review of Systems;  Patient denies headache, fevers, malaise, unintentional weight loss, skin rash, eye pain, sinus congestion and sinus pain, sore throat, dysphagia,  hemoptysis , cough, dyspnea, wheezing, chest pain, palpitations, orthopnea, edema, abdominal pain, nausea, melena, diarrhea, constipation, flank pain, dysuria, hematuria, urinary  Frequency, nocturia, numbness, tingling, seizures,  Focal weakness, Loss of consciousness,  Tremor, insomnia, depression, anxiety, and suicidal ideation.      Objective:  BP (!) 118/58   Pulse 89   Temp 97.6 F (36.4 C) (Oral)   Ht (P) 5' (1.524 m)   Wt (P) 108 lb 2 oz (49 kg)   SpO2 98%   BMI (P) 21.12 kg/m   BP Readings from Last 3 Encounters:  11/30/15 (!) 119/59  11/28/15 (!) 118/58  11/05/15 136/73    Wt Readings from Last 3 Encounters:  11/30/15 108 lb 14.5 oz (49.4 kg)  11/28/15 (P) 108 lb 2 oz (49 kg)  11/05/15 106 lb 14.8 oz (48.5 kg)    General appearance: alert, cooperative and appears stated age Ears: normal TM's and external ear canals both ears Throat: lips, mucosa, and tongue normal; teeth and gums normal Neck: no adenopathy, no carotid bruit, supple, symmetrical, trachea midline and thyroid not enlarged, symmetric, no tenderness/mass/nodules Breast:  fungating mass covering chest wall and right upper arm .  [name] Kitchen Lungs: clear to auscultation bilaterally Heart: regular rate and rhythm, S1, S2 normal, no murmur, click, rub or gallop Abdomen: soft, non-tender; bowel sounds normal; no masses,  no organomegaly Pulses: 2+ and symmetric Skin: Skin color, texture, turgor normal. No rashes or lesions Lymph nodes: Cervical, supraclavicular, and axillary nodes normal.  Lab Results  Component Value Date   HGBA1C 5.6 11/28/2015   HGBA1C 6.0 04/03/2015   HGBA1C 6.3 (H) 11/02/2014    Lab Results  Component Value Date   CREATININE 0.52 11/30/2015   CREATININE 0.57 11/28/2015   CREATININE 0.49  09/18/2015    Lab Results  Component Value Date   WBC 7.8 11/30/2015   HGB 8.7 (L) 11/30/2015   HCT 27.3 (L) 11/30/2015   PLT 242 11/30/2015   GLUCOSE 122 (H) 11/30/2015   CHOL 173 08/14/2014   TRIG 82.0 08/14/2014   HDL 68.10 08/14/2014   LDLCALC 89 08/14/2014   ALT 10 (L) 11/30/2015   AST 23 11/30/2015   NA 134 (L) 11/30/2015   K 3.7 11/30/2015   CL 103 11/30/2015   CREATININE 0.52 11/30/2015   BUN 15 11/30/2015   CO2 24 11/30/2015   TSH 1.943 09/19/2014   INR 0.9 08/24/2014  HGBA1C 5.6 11/28/2015   MICROALBUR 15.8 (H) 11/28/2015    No results found.  Assessment & Plan:   Problem List Items Addressed This Visit    Hypertension    Well controlled on current regimen. Renal function stable, no changes today.  Lab Results  Component Value Date   CREATININE 0.52 11/30/2015   Lab Results  Component Value Date   NA 134 (L) 11/30/2015   K 3.7 11/30/2015   CL 103 11/30/2015   CO2 24 11/30/2015         Diabetes mellitus without complication ([place])    Advised to stop metformin given her excellent control and persistent diarrhea.  She has mild microalbuminuria on today's exam .  Will consider change from amlodipine to losartan , but given her prognosis,  The risks af adverse effects outweigh  The potential benefit   Lab Results  Component Value Date   HGBA1C 5.6 11/28/2015   Lab Results  Component Value Date   MICROALBUR 15.8 (H) 11/28/2015        Relevant Orders   Hemoglobin A1c (Completed)   Microalbumin / creatinine urine ratio (Completed)   Comp Met (CMET) (Completed)   Skin metastases ([place])    She has developed several areas on her chest wall and arm that are occasionally draining, which she has been handling inadequately with band aids..  Wound care done today with use of 4 x 6 telpha pads, and she was advised to see if Dawn Wiley had any undershirts that would help her to keep the pads in place.       Relevant Medications   ciprofloxacin (CIPRO) 250  MG tablet   Acute recurrent cystitis    May be complicated by frequent diarrhea.  Previous treatment was amoxicillin.  Will use cipro pending urine results.          Other Visit Diagnoses    Urinary frequency    -  Primary   Relevant Orders   POCT Urinalysis Dipstick (Completed)   Urinalysis, Routine w reflex microscopic (not at [facility]) (Completed)   Urine culture (Completed)    A total of 25 minutes of face to face time was spent with patient more than half of which was spent in counselling about the above mentioned conditions  and coordination of care   I have discontinued Dawn Wiley's metFORMIN, loperamide, and Investigational - Study Medication. I am also having her start on ciprofloxacin. Additionally, I am having her maintain her acetaminophen, ferrous sulfate, aspirin, diphenhydrAMINE, Calcium Carbonate (CALTRATE 600 PO), cholecalciferol, Cyanocobalamin (VITAMIN B-12 IJ), metoprolol tartrate, glucose blood, Safety Lancet 28G/Pressure Act, amLODipine, Cyanocobalamin (VITAMIN B 12 PO), potassium chloride, temazepam, HYDROcodone-acetaminophen, and potassium chloride.  Meds ordered this encounter  Medications  . ciprofloxacin (CIPRO) 250 MG tablet    Sig: Take 1 tablet (250 mg total) by mouth 2 (two) times daily.    Dispense:  10 tablet    Refill:  0    Medications Discontinued During This Encounter  Medication Reason  . Investigational - Study Medication Patient Preference  . loperamide (IMODIUM) 2 MG capsule Patient Preference  . metFORMIN (GLUCOPHAGE) 500 MG tablet    A total of 25 minutes of face to face time was spent with patient more than half of which was spent in counselling about the above mentioned conditions  and coordination of care  Follow-up: No Follow-up on file.   Crecencio Mc, MD

## 2015-11-30 ENCOUNTER — Encounter (INDEPENDENT_AMBULATORY_CARE_PROVIDER_SITE_OTHER): Payer: Self-pay

## 2015-11-30 ENCOUNTER — Inpatient Hospital Stay: Payer: PPO | Admitting: *Deleted

## 2015-11-30 ENCOUNTER — Other Ambulatory Visit: Payer: Self-pay | Admitting: Hematology and Oncology

## 2015-11-30 ENCOUNTER — Other Ambulatory Visit: Payer: Self-pay | Admitting: *Deleted

## 2015-11-30 ENCOUNTER — Encounter: Payer: Self-pay | Admitting: Internal Medicine

## 2015-11-30 ENCOUNTER — Inpatient Hospital Stay: Payer: PPO | Attending: Hematology and Oncology | Admitting: Hematology and Oncology

## 2015-11-30 ENCOUNTER — Inpatient Hospital Stay: Payer: PPO

## 2015-11-30 VITALS — BP 119/59 | HR 76 | Temp 97.7°F | Resp 18 | Wt 108.9 lb

## 2015-11-30 DIAGNOSIS — L988 Other specified disorders of the skin and subcutaneous tissue: Secondary | ICD-10-CM

## 2015-11-30 DIAGNOSIS — Z5112 Encounter for antineoplastic immunotherapy: Secondary | ICD-10-CM | POA: Insufficient documentation

## 2015-11-30 DIAGNOSIS — E538 Deficiency of other specified B group vitamins: Secondary | ICD-10-CM | POA: Diagnosis not present

## 2015-11-30 DIAGNOSIS — C792 Secondary malignant neoplasm of skin: Secondary | ICD-10-CM | POA: Diagnosis not present

## 2015-11-30 DIAGNOSIS — Z8669 Personal history of other diseases of the nervous system and sense organs: Secondary | ICD-10-CM | POA: Insufficient documentation

## 2015-11-30 DIAGNOSIS — I10 Essential (primary) hypertension: Secondary | ICD-10-CM | POA: Insufficient documentation

## 2015-11-30 DIAGNOSIS — C50911 Malignant neoplasm of unspecified site of right female breast: Secondary | ICD-10-CM

## 2015-11-30 DIAGNOSIS — E119 Type 2 diabetes mellitus without complications: Secondary | ICD-10-CM | POA: Insufficient documentation

## 2015-11-30 DIAGNOSIS — Z79899 Other long term (current) drug therapy: Secondary | ICD-10-CM | POA: Diagnosis not present

## 2015-11-30 DIAGNOSIS — R21 Rash and other nonspecific skin eruption: Secondary | ICD-10-CM | POA: Diagnosis not present

## 2015-11-30 DIAGNOSIS — Z171 Estrogen receptor negative status [ER-]: Secondary | ICD-10-CM | POA: Diagnosis not present

## 2015-11-30 DIAGNOSIS — G893 Neoplasm related pain (acute) (chronic): Secondary | ICD-10-CM | POA: Diagnosis not present

## 2015-11-30 DIAGNOSIS — E785 Hyperlipidemia, unspecified: Secondary | ICD-10-CM | POA: Diagnosis not present

## 2015-11-30 DIAGNOSIS — R000 Tachycardia, unspecified: Secondary | ICD-10-CM

## 2015-11-30 DIAGNOSIS — I2510 Atherosclerotic heart disease of native coronary artery without angina pectoris: Secondary | ICD-10-CM

## 2015-11-30 DIAGNOSIS — R918 Other nonspecific abnormal finding of lung field: Secondary | ICD-10-CM | POA: Diagnosis not present

## 2015-11-30 DIAGNOSIS — Z7984 Long term (current) use of oral hypoglycemic drugs: Secondary | ICD-10-CM | POA: Insufficient documentation

## 2015-11-30 DIAGNOSIS — D509 Iron deficiency anemia, unspecified: Secondary | ICD-10-CM | POA: Diagnosis not present

## 2015-11-30 DIAGNOSIS — Z7982 Long term (current) use of aspirin: Secondary | ICD-10-CM

## 2015-11-30 DIAGNOSIS — M129 Arthropathy, unspecified: Secondary | ICD-10-CM | POA: Insufficient documentation

## 2015-11-30 DIAGNOSIS — M818 Other osteoporosis without current pathological fracture: Secondary | ICD-10-CM | POA: Insufficient documentation

## 2015-11-30 DIAGNOSIS — IMO0001 Reserved for inherently not codable concepts without codable children: Secondary | ICD-10-CM

## 2015-11-30 DIAGNOSIS — Z87442 Personal history of urinary calculi: Secondary | ICD-10-CM

## 2015-11-30 DIAGNOSIS — C50919 Malignant neoplasm of unspecified site of unspecified female breast: Secondary | ICD-10-CM

## 2015-11-30 DIAGNOSIS — C779 Secondary and unspecified malignant neoplasm of lymph node, unspecified: Secondary | ICD-10-CM | POA: Diagnosis not present

## 2015-11-30 DIAGNOSIS — D649 Anemia, unspecified: Secondary | ICD-10-CM

## 2015-11-30 DIAGNOSIS — C50912 Malignant neoplasm of unspecified site of left female breast: Secondary | ICD-10-CM

## 2015-11-30 DIAGNOSIS — L299 Pruritus, unspecified: Secondary | ICD-10-CM | POA: Insufficient documentation

## 2015-11-30 LAB — CBC WITH DIFFERENTIAL/PLATELET
Basophils Absolute: 0 10*3/uL (ref 0–0.1)
Basophils Relative: 1 %
Eosinophils Absolute: 0.2 10*3/uL (ref 0–0.7)
Eosinophils Relative: 2 %
HCT: 27.3 % — ABNORMAL LOW (ref 35.0–47.0)
Hemoglobin: 8.7 g/dL — ABNORMAL LOW (ref 12.0–16.0)
Lymphocytes Relative: 12 %
Lymphs Abs: 0.9 10*3/uL — ABNORMAL LOW (ref 1.0–3.6)
MCH: 27.6 pg (ref 26.0–34.0)
MCHC: 32 g/dL (ref 32.0–36.0)
MCV: 86.2 fL (ref 80.0–100.0)
Monocytes Absolute: 0.9 10*3/uL (ref 0.2–0.9)
Monocytes Relative: 11 %
Neutro Abs: 5.8 10*3/uL (ref 1.4–6.5)
Neutrophils Relative %: 74 %
Platelets: 242 10*3/uL (ref 150–440)
RBC: 3.17 MIL/uL — ABNORMAL LOW (ref 3.80–5.20)
RDW: 17.7 % — ABNORMAL HIGH (ref 11.5–14.5)
WBC: 7.8 10*3/uL (ref 3.6–11.0)

## 2015-11-30 LAB — COMPREHENSIVE METABOLIC PANEL
ALT: 10 U/L — ABNORMAL LOW (ref 14–54)
AST: 23 U/L (ref 15–41)
Albumin: 3.9 g/dL (ref 3.5–5.0)
Alkaline Phosphatase: 69 U/L (ref 38–126)
Anion gap: 7 (ref 5–15)
BUN: 15 mg/dL (ref 6–20)
CO2: 24 mmol/L (ref 22–32)
Calcium: 8.7 mg/dL — ABNORMAL LOW (ref 8.9–10.3)
Chloride: 103 mmol/L (ref 101–111)
Creatinine, Ser: 0.52 mg/dL (ref 0.44–1.00)
GFR calc Af Amer: >60 mL/min (ref >60)
GFR calc non Af Amer: >60 mL/min (ref >60)
Glucose, Bld: 122 mg/dL — ABNORMAL HIGH (ref 65–99)
Potassium: 3.7 mmol/L (ref 3.5–5.1)
Sodium: 134 mmol/L — ABNORMAL LOW (ref 135–145)
Total Bilirubin: 0.5 mg/dL (ref 0.3–1.2)
Total Protein: 6.8 g/dL (ref 6.5–8.1)

## 2015-11-30 MED ORDER — ONDANSETRON HCL 4 MG PO TABS
8.0000 mg | ORAL_TABLET | Freq: Once | ORAL | Status: AC
  Administered 2015-11-30: 8 mg via ORAL
  Filled 2015-11-30: qty 2

## 2015-11-30 MED ORDER — VINORELBINE TARTRATE CHEMO INJECTION 50 MG/5ML
25.0000 mg/m2 | Freq: Once | INTRAVENOUS | Status: AC
  Administered 2015-11-30: 36 mg via INTRAVENOUS
  Filled 2015-11-30: qty 3.6

## 2015-11-30 MED ORDER — SODIUM CHLORIDE 0.9 % IV SOLN
Freq: Once | INTRAVENOUS | Status: AC
  Administered 2015-11-30: 12:00:00 via INTRAVENOUS
  Filled 2015-11-30: qty 1000

## 2015-11-30 MED ORDER — DIPHENHYDRAMINE HCL 25 MG PO CAPS
50.0000 mg | ORAL_CAPSULE | Freq: Once | ORAL | Status: AC
  Administered 2015-11-30: 50 mg via ORAL
  Filled 2015-11-30: qty 2

## 2015-11-30 MED ORDER — CYANOCOBALAMIN 1000 MCG/ML IJ SOLN
1000.0000 ug | Freq: Once | INTRAMUSCULAR | Status: AC
Start: 2015-11-30 — End: 2015-11-30
  Administered 2015-11-30: 1000 ug via INTRAMUSCULAR
  Filled 2015-11-30: qty 1

## 2015-11-30 MED ORDER — TRASTUZUMAB CHEMO 150 MG IV SOLR
4.0000 mg/kg | Freq: Once | INTRAVENOUS | Status: AC
  Administered 2015-11-30: 189 mg via INTRAVENOUS
  Filled 2015-11-30: qty 7.14

## 2015-11-30 MED ORDER — ACETAMINOPHEN 325 MG PO TABS
650.0000 mg | ORAL_TABLET | Freq: Once | ORAL | Status: AC
  Administered 2015-11-30: 650 mg via ORAL
  Filled 2015-11-30: qty 2

## 2015-11-30 NOTE — Progress Notes (Signed)
Patient is here for follow up, she is doing well she notes some discomfort in right side of her chest.

## 2015-11-30 NOTE — Progress Notes (Addendum)
[facility] day:  11/30/15   Chief Complaint: Dawn Wiley is an 81 y.o. female with stage IV right breast cancer who is seen for monthly assessment prior to initiation of Navelbine and Herceptin.  HPI: The patient was last seen in the medical oncology clinic on 11/05/2015.  At that time, she was on the neratinib and Faslodex trial from [facility].  After an initial response to therapy, her chest wall lesions had rapidly grown in the prior 2-3 weeks.  Diarrhea was controlled.  Her weight was up 1 pound.  Counts were good.  Potassium was normal on supplementation.  She received B12.  Bone scan on 11/01/2015 at [facility] revealed no evidence of metastatic disease.    Chest, abdomen, and pelvic CT scan at [facility] of 11/01/2015 revealed interval increase in nodular skin thickening overlying the left anterior chest/upper abdomen. There was decreased left axillary adenopathy. There was interval development of anterior right middle and right upper lobe groundglass and consolidative opacities which may be infectious or inflammatory. There was circumferential wall thickening of the ascending colon. There was a new subcentimeter liver lesion too small to characterize.  She was taken off the [facility] study secondary to progressive disease.  She last received Herceptin on 11/01/2015.  Symptomatically, she notes that the lesions have continued to grow on her chest wall. They are uncomfortable.  Some of these lesions have broken open.   Past Medical History:  Diagnosis Date  . Anemia   . Arthritis   . Breast cancer ([place])   . Cancer [facility])    breast (right)  . Coronary artery disease    Coronary calcifications noted on CT scan  . Diabetes mellitus without complication ([facility])   . Dysrhythmia    TACHYCARDIA-METOPROLOL CONTROLS THIS WELL  . Headache    H/O MIGRAINES  . Hyperlipidemia   . Hypertension   . Kidney stones   . Nephrolithiasis   . PONV (postoperative nausea  and vomiting)   . Rash    back  . Tachycardia    Dr. Fletcher Anon, cardiologist    Past Surgical History:  Procedure Laterality Date  . BREAST SURGERY     breast biopsy X 3  . CATARACT EXTRACTION     bilateral  . EYE SURGERY     bilateral cataract extraction  . MASTECTOMY MODIFIED RADICAL Right 08/31/2014   Procedure: MASTECTOMY MODIFIED RADICAL;  Surgeon: Molly Maduro, MD;  Location: [facility] ORS;  Service: General;  Laterality: Right;  . PORT-A-CATH REMOVAL N/A 10/08/2015   Procedure: REMOVAL PORT-A-CATH;  Surgeon: Clayburn Pert, MD;  Location: [facility] ORS;  Service: General;  Laterality: N/A;  . PORTACATH PLACEMENT Left 10/20/2014   Procedure: INSERTION PORT-A-CATH;  Surgeon: Marlyce Huge, MD;  Location: [facility] ORS;  Service: General;  Laterality: Left;  . TEMPOROMANDIBULAR JOINT SURGERY      Family History  Problem Relation Age of Onset  . Peripheral Artery Disease Sister     carotid artery stenosis   . Heart disease Sister   . Diabetes Sister     Social History:  reports that she has never smoked. She has never used smokeless tobacco. She reports that she does not drink alcohol or use drugs.  The patient is accompanied by her daughter today.  Her daughter's phone number is [phone].    Allergies: No Known Allergies  Current Medications: Current Outpatient Prescriptions  Medication Sig Dispense Refill  . acetaminophen (TYLENOL) 500 MG tablet Take 500 mg by mouth  every 6 (six) hours as needed for mild pain or moderate pain.     [name] Kitchen amLODipine (NORVASC) 5 MG tablet Take 1 tablet (5 mg total) by mouth daily. (Patient taking differently: Take 5 mg by mouth every morning. ) 90 tablet 1  . aspirin 81 MG tablet Take 81 mg by mouth every other day.    . Calcium Carbonate (CALTRATE 600 PO) Take 1,200 mg by mouth daily.     . cholecalciferol (VITAMIN D) 400 UNITS TABS tablet Take 800 Units by mouth.    . ciprofloxacin (CIPRO) 250 MG tablet Take 1 tablet (250 mg total) by mouth 2  (two) times daily. 10 tablet 0  . Cyanocobalamin (VITAMIN B 12 PO) Take 1 tablet by mouth every morning.    . Cyanocobalamin (VITAMIN B-12 IJ) Inject as directed every 30 (thirty) days.     . diphenhydrAMINE (BENADRYL) 25 mg capsule Take 25 mg by mouth every 6 (six) hours as needed for allergies.    . ferrous sulfate 325 (65 FE) MG tablet Take 325 mg by mouth daily with breakfast.     . glucose blood test strip Test blood sugars every morning and after meals DX E 11.19 100 each 12  . HYDROcodone-acetaminophen (NORCO) 7.5-325 MG tablet Take 1 tablet by mouth every 6 (six) hours as needed for moderate pain. 60 tablet 0  . Lancets (SAFETY LANCET 28G/PRESSURE ACT) MISC 1 each by Other route as needed (to test glucose levels). 50 each 12  . metFORMIN (GLUCOPHAGE) 500 MG tablet Take 1 tablet (500 mg total) by mouth 2 (two) times daily with a meal. 180 tablet 1  . metoprolol tartrate (LOPRESSOR) 25 MG tablet Take 1 tablet (25 mg total) by mouth 2 (two) times daily. 180 tablet 2  . potassium chloride (K-DUR) 10 MEQ tablet Take 1 tablet by mouth.    . potassium chloride (K-DUR,KLOR-CON) 10 MEQ tablet Take 1 tablet (10 mEq total) by mouth daily. 30 tablet 2  . temazepam (RESTORIL) 15 MG capsule 1 tablet at night as needed for sleep 30 capsule 1   No current facility-administered medications for this visit.    Facility-Administered Medications Ordered in Other Visits  Medication Dose Route Frequency Provider Last Rate Last Dose  . sodium chloride 0.9 % injection 10 mL  10 mL Intravenous PRN [name], MD      . sodium chloride 0.9 % injection 10 mL  10 mL Intravenous PRN [name], MD   10 mL at 03/21/15 0849  . sodium chloride 0.9 % injection 10 mL  10 mL Intracatheter PRN [name], MD   10 mL at 04/25/15 0834  . sodium chloride flush (NS) 0.9 % injection 10 mL  10 mL Intravenous PRN [name], MD   10 mL at 09/18/15 1423    Review of Systems:  GENERAL: Feels  "ok". No fevers or sweats. Nadir weight 103 poundsWeight up 2 pounds. PERFORMANCE STATUS (ECOG): 1 HEENT: No visual changes, runny nose, sore throat, or tenderness. Lungs: No shortness of breath or cough. No hemoptysis. Cardiac: No chest pain, palpitations, orthopnea, or PND. GI: No nausea, vomiting, diarrhea, constipation, melena or hematochezia. GU: No urgency, frequency, dysuria, or hematuria. Musculoskeletal: No back pain. No joint pain. No muscle tenderness. Extremities: No pain or swelling. Skin: Chest wall lesions growing.  Some lesions leaking. Neuro: No headache, numbness or weakness, balance or coordination issues. Endocrine: Diabetes.  No thyroid issues, hot flashes or night sweats. Psych: No  mood changes.  Denies depression or anxiety.  Trouble sleeping. Pain: Chest wall lesions uncomfortable. Review of systems: All other systems reviewed and found to be negative.  Physical Exam: Blood pressure (!) 119/59, pulse 76, temperature 97.7 F (36.5 C), temperature source Tympanic, resp. rate 18, weight 108 lb 14.5 oz (49.4 kg). GENERAL: Thin fatigued appearing elderly woman sitting comfortably in the exam room in no acute distress. MENTAL STATUS: Alert and oriented to person, place and time. HEAD: Short gray hair. Normocephalic, atraumatic, face symmetric, no Cushingoid features. EYES: Blue eyes. Pupils equal round and reactive to light and accomodation.  No conjunctivitis or scleral icterus. RESPIRATORY:  Clear to auscultation without rales, wheezes or rhonchi. CARDIOVASCULAR:  Regular rate and rhythm without murmur, rub or gallop. BREAST: Right mastectomy. Enlarging right chest wall lesions measuring 5.5  X 5.5 cm, 3 x 3 cm, 3 x 2.5 cm.  Nodular area 6 x 5.5 cm medially involving some of left breast.  Enlarging right lateral back mass measuring 12 x 14.5 cm. ABDOMEN:  Soft, non-tender, with active bowel sounds, and no hepatosplenomegaly.  No masses. SKIN:  Chest wall lesions as above.  Upper right arm 8 x 6.5 cm nodular lesion. EXTREMITIES: No edema, no skin discoloration or tenderness. No palpable cords. LYMPH NODES: No palpable axillary adenopathy.  Small right supraclavicular node. NEUROLOGICAL: Unremarkable. PSYCH: Appropriate.   Clinical Support on 11/30/2015  Component Date Value Ref Range Status  . WBC 11/30/2015 7.8  3.6 - 11.0 K/uL Final  . RBC 11/30/2015 3.17* 3.80 - 5.20 MIL/uL Final  . Hemoglobin 11/30/2015 8.7* 12.0 - 16.0 g/dL Final  . HCT 11/30/2015 27.3* 35.0 - 47.0 % Final  . MCV 11/30/2015 86.2  80.0 - 100.0 fL Final  . MCH 11/30/2015 27.6  26.0 - 34.0 pg Final  . MCHC 11/30/2015 32.0  32.0 - 36.0 g/dL Final  . RDW 11/30/2015 17.7* 11.5 - 14.5 % Final  . Platelets 11/30/2015 242  150 - 440 K/uL Final  . Neutrophils Relative % 11/30/2015 74  % Final  . Neutro Abs 11/30/2015 5.8  1.4 - 6.5 K/uL Final  . Lymphocytes Relative 11/30/2015 12  % Final  . Lymphs Abs 11/30/2015 0.9* 1.0 - 3.6 K/uL Final  . Monocytes Relative 11/30/2015 11  % Final  . Monocytes Absolute 11/30/2015 0.9  0.2 - 0.9 K/uL Final  . Eosinophils Relative 11/30/2015 2  % Final  . Eosinophils Absolute 11/30/2015 0.2  0 - 0.7 K/uL Final  . Basophils Relative 11/30/2015 1  % Final  . Basophils Absolute 11/30/2015 0.0  0 - 0.1 K/uL Final  . Sodium 11/30/2015 134* 135 - 145 mmol/L Final  . Potassium 11/30/2015 3.7  3.5 - 5.1 mmol/L Final  . Chloride 11/30/2015 103  101 - 111 mmol/L Final  . CO2 11/30/2015 24  22 - 32 mmol/L Final  . Glucose, Bld 11/30/2015 122* 65 - 99 mg/dL Final  . BUN 11/30/2015 15  6 - 20 mg/dL Final  . Creatinine, Ser 11/30/2015 0.52  0.44 - 1.00 mg/dL Final  . Calcium 11/30/2015 8.7* 8.9 - 10.3 mg/dL Final  . Total Protein 11/30/2015 6.8  6.5 - 8.1 g/dL Final  . Albumin 11/30/2015 3.9  3.5 - 5.0 g/dL Final  . AST 11/30/2015 23  15 - 41 U/L Final  . ALT 11/30/2015 10* 14 - 54 U/L Final  . Alkaline Phosphatase 11/30/2015 69  38 -  126 U/L Final  . Total Bilirubin 11/30/2015 0.5  0.3 - 1.2  mg/dL Final  . GFR calc non Af Amer 11/30/2015 >60  >60 mL/min Final  . GFR calc Af Amer 11/30/2015 >60  >60 mL/min Final   Comment: (NOTE) The eGFR has been calculated using the CKD EPI equation. This calculation has not been validated in all clinical situations. eGFR's persistently <60 mL/min signify possible Chronic Kidney Disease.   . Anion gap 11/30/2015 7  5 - 15 Final  Office Visit on 11/28/2015  Component Date Value Ref Range Status  . Color, UA 11/28/2015 Yellow   Final  . Clarity, UA 11/28/2015 Cloudy   Final  . Glucose, UA 11/28/2015 neg   Final  . Bilirubin, UA 11/28/2015 positive   Final  . Ketones, UA 11/28/2015 positive   Final  . Spec Grav, UA 11/28/2015 >=1.030   Final  . Blood, UA 11/28/2015 positive   Final  . pH, UA 11/28/2015 5.5   Final  . Protein, UA 11/28/2015 positive   Final  . Urobilinogen, UA 11/28/2015 0.2   Final  . Nitrite, UA 11/28/2015 positive   Final  . Leukocytes, UA 11/28/2015 moderate (2+)* Negative Final  . Hgb A1c MFr Bld 11/28/2015 5.6  4.6 - 6.5 % Final  . Color, Urine 11/28/2015 YELLOW  Yellow;Lt. Yellow Final  . APPearance 11/28/2015 CLEAR  Clear Final  . Specific Gravity, Urine 11/28/2015 1.025  1.000 - 1.030 Final  . pH 11/28/2015 5.5  5.0 - 8.0 Final  . Total Protein, Urine 11/28/2015 30* Negative Final  . Urine Glucose 11/28/2015 NEGATIVE  Negative Final  . Ketones, ur 11/28/2015 NEGATIVE  Negative Final  . Bilirubin Urine 11/28/2015 NEGATIVE  Negative Final  . Hgb urine dipstick 11/28/2015 SMALL* Negative Final  . Urobilinogen, UA 11/28/2015 0.2  0.0 - 1.0 Final  . Leukocytes, UA 11/28/2015 LARGE* Negative Final  . Nitrite 11/28/2015 POSITIVE* Negative Final  . WBC, UA 11/28/2015 TNTC(>50/hpf)* 0-2/hpf Final  . RBC / HPF 11/28/2015 3-6/hpf* 0-2/hpf Final  . Squamous Epithelial / LPF 11/28/2015 Rare(0-4/hpf)  Rare(0-4/hpf) Final  . Bacteria, UA 11/28/2015 Many(>50/hpf)*  None Final  . Microalb, Ur 11/28/2015 15.8* 0.0 - 1.9 mg/dL Final  . Creatinine,U 11/28/2015 120.2  mg/dL Final  . Microalb Creat Ratio 11/28/2015 13.1  0.0 - 30.0 mg/g Final  . Sodium 11/28/2015 138  135 - 145 mEq/L Final  . Potassium 11/28/2015 4.0  3.5 - 5.1 mEq/L Final  . Chloride 11/28/2015 103  96 - 112 mEq/L Final  . CO2 11/28/2015 26  19 - 32 mEq/L Final  . Glucose, Bld 11/28/2015 130* 70 - 99 mg/dL Final  . BUN 11/28/2015 13  6 - 23 mg/dL Final  . Creatinine, Ser 11/28/2015 0.57  0.40 - 1.20 mg/dL Final  . Total Bilirubin 11/28/2015 0.4  0.2 - 1.2 mg/dL Final  . Alkaline Phosphatase 11/28/2015 67  39 - 117 U/L Final  . AST 11/28/2015 18  0 - 37 U/L Final  . ALT 11/28/2015 8  0 - 35 U/L Final  . Total Protein 11/28/2015 6.8  6.0 - 8.3 g/dL Final  . Albumin 11/28/2015 3.8  3.5 - 5.2 g/dL Final  . Calcium 11/28/2015 9.4  8.4 - 10.5 mg/dL Final  . GFR 11/28/2015 108.62  >60.00 mL/min Final    Assessment:  SHANEEQUA BAHNER is an 81 y.o. female with stage IV breast cancer.  She presented with stage IIIC right breast cancer status post mastectomy and axillary lymph node dissection on 08/31/2014. 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 Carcinoma involved skeletal muscle and dermal lymphatics. Tumor was less than 0.5 mm from the deep margin. Thirteen of 15 lymph nodes were involved. The largest metastatic focus was 2 cm. Tumor is triple negative (ER negative, PR negative, and HER-2/neu negative).  Androgen receptor and PDL-1 testing was negative.  Pathologic stage was [id number].  PET scan on 09/21/2014 revealed postoperative changes in the right chest with no suspicious findings or residual tumor. Bone scan on 10/11/2014 revealed no evidence of metastatic disease.  Bone density study on 10/02/2014 revealed a T score of -4.7 in the forearm consistent with osteoporosis. T-score was less than 2.5 in the spine or hip.   Echo on  10/02/2014 revealed ejection fraction of 60-65%.  Echo on 06/27/2015 revealed EF of 55-60%.  Echo on 08/31/2015 revealed EF of 55%.  She has iron deficiency anemia. Labs on 09/19/2014 revealed a hematocrit of 29.9, hemoglobin 8.7, MCV 69.7, ferritin 9, and TIBC 616. B12 was low (265) with a prior history of B12 deficiency and need for supplementation (stopped 06/2014). She began B12 on 10/12/2014 (last 09/19/2015).  Folate was normal.  Her diet is modest. She has never had a colonoscopy. She denies any melena or hematochezia.  She takes 1 iron pill a day (additional iron causes diarrhea).  Ferritin was 116 on 04/25/2015.  Chest wall biopsy x 3 (lateral, medial, and middle sites) on 11/08/2014 revealed dermal lymphatic involvement by invasive mammary carcinoma with micropapillary features.  Right arm punch biopsy on 06/28/2015 confirmed breast cancer.  She has aggressive disease with growth despite several chemotherapeutic agents.  She received 5 weeks of Taxol (11/03/2014 - 12/01/2014).  She received 1 week of adriamycin (12/14/2014).  She received 1 cycle of carboplatin (12/28/2014).  After carboplatin, disease appeared to stabilize, but with 2 weeks off of therapy, her disease grew again (medial lesion larger and weaping).   She received 3 cycles of carboplatin and Taxol (01/18/2015 - 03/02/2015).  Chest wall lesions initially decreased then began to grow.  She received 2 cycles of Adriamycin and Cytoxan (03/26/2015 - 04/11/2015) with Neulasta support.  ANC nadir was 200 on day 9 (04/03/2015).  Cycle #2 was complicated by mouth sores, decreased oral intake, and weight loss. Chest wall disease began to grow before cycle #3 could be administered.  PET scan on 04/26/2015 revealed extensive recurrence about the right chest.  The far lateral component measured 4.2 x 2.2 cm (S.U.V.13.5).  A portion superficial to the right-side of the sternum measured 5.2 x 2.2 cm (S.U.V. 9.1).  There was diffuse more  inferior and lateral chest wall soft tissue thickening and hypermetabolism.    CA27.29 was 118.4 on 05/02/2015, 92.9 on 06/11/2015, 38.5 on 07/03/2015, and 27.4 on 07/24/2015.  She received 5 weeks of concurrent carboplatin and radiation (05/14/2015 - 06/11/2015).  Radiation completed on 06/15/2015.  Cutaneous lesions in the field of radiation improved dramatically.    [facility] testing on 05/22/2015 returned genomic alterations of ERBB2 (S310F and V777L), FGFR1 (amplification), CDH1 (E490*), CDKN2A (p16INK4a H83Y and p14ARF A97V), MYST3 (amplification), TERT (promotor -124C>T), TP53  (Y220C), and ZNF703 (amplification).  FDA approved therapies included ado-trastuzumab, lapatinib, pertuzumab, trastuzumab for ERBB2 as well as afatinib (approved in other tumor type) and pazopanib and ponatinib (approved in other tumor types for FGFR1).  She received 8 weeks of Herceptin (07/03/2015 - 08/21/2015).  Lesions continued to progress.  PET scan on 08/31/2015 revealed the interval development of multifocal hypermetabolic skin thickening and masses, representing new cutaneous metastases.  Interval resolution of superior right anterior chest wall subcutaneous lesion.  There was interval development of multiple hypermetabolic left axillary lymph nodes compatible with metastasis. There was interval resolution of right axillary hypermetabolic lymph nodes.   Bone scan on 08/31/2015 revealed no evidence of metastatic disease.  She has a non-functional port-a-cath.  CXR revealed that the tip of the port-a-cath abuts the lateral wall of the mid SVC.  She is scheduled for port removal on 10/08/2015.  She received neratinib and Faslodex (began 09/07/2015) on a [facility] clinical trial.  She initially had a dramatic response.  She then developed rapid growth.  Weekly Herceptin was added on 10/26/2015 (last 11/01/2015).   Bone scan on 11/01/2015 at [facility] revealed no evidence of metastatic disease.    Chest, abdomen, and  pelvic CT scan at [facility] of 11/01/2015 revealed interval increase in nodular skin thickening overlying the left anterior chest/upper abdomen. There was decreased left axillary adenopathy. There was interval development of anterior right middle and right upper lobe groundglass and consolidative opacities. There was circumferential wall thickening of the ascending colon. There was a new subcentimeter liver lesion too small to characterize.   Symptomatically, her chest wall lesions continue to rapidly grow.    Plan: 1.  Labs today:  CBC with diff, CMP. 2.   Review interim events at [facility] and protocol discontinuation.  Discuss treatment options (limited).  Discuss Navelbine and Herceptin.  Discuss possible use of laptinib (Tykerb) +/- Xeloda.  Discuss possible use of pazopanib (Votrient) as her [facility] testing notes the presence of EGFR amplification.  Discuss possible palliative radiation to painful sites of disease. 3.  Discuss treatment with Navelbine and Herceptin in detail.  Anticipate Navelbine 2 weeks on/1 week off (or weekly if counts tolerate).  Side effects reviewed.  Information provided.  Patient consented to treatment. 4.  Discuss port-a-cath placement. 5.  Schedule follow-up echo next week.  Last echo 08/31/2015. 6.  Assist patient with obtaining adequate dressings (curently cost prohibitive). 7.  Week #1 Herceptin and Navelbine today. 8.  B12 today and monthly. 9.  Medical photographs today. 10.  [facility]. 11.  RTC in 1 week for MD assess, labs (CBC with diff, CMP, Mg, CA27.29, ferritin, iron studies, folate) and Herceptin and Navelbine.   [name], MD  11/30/2015, 10:55 AM

## 2015-12-01 DIAGNOSIS — N3000 Acute cystitis without hematuria: Secondary | ICD-10-CM | POA: Insufficient documentation

## 2015-12-01 LAB — URINE CULTURE: Colony Count: >=100000

## 2015-12-01 NOTE — Assessment & Plan Note (Signed)
She has developed several areas on her chest wall and arm that are occasionally draining, which she has been handling inadequately with band aids..  Wound care done today with use of 4 x 6 telpha pads, and she was advised to see if Dawn Wiley had any undershirts that would help her to keep the pads in place.

## 2015-12-01 NOTE — Assessment & Plan Note (Signed)
May be complicated by frequent diarrhea.  Previous treatment was amoxicillin.  Will use cipro pending urine results.

## 2015-12-01 NOTE — Assessment & Plan Note (Signed)
Advised to stop metformin given her excellent control and persistent diarrhea.  She has mild microalbuminuria on today's exam .  Will consider change from amlodipine to losartan , but given her prognosis,  The risks af adverse effects outweigh  The potential benefit   Lab Results  Component Value Date   HGBA1C 5.6 11/28/2015   Lab Results  Component Value Date   MICROALBUR 15.8 (H) 11/28/2015

## 2015-12-01 NOTE — Assessment & Plan Note (Signed)
Well controlled on current regimen. Renal function stable, no changes today.  Lab Results  Component Value Date   CREATININE 0.52 11/30/2015   Lab Results  Component Value Date   NA 134 (L) 11/30/2015   K 3.7 11/30/2015   CL 103 11/30/2015   CO2 24 11/30/2015

## 2015-12-02 ENCOUNTER — Encounter: Payer: Self-pay | Admitting: Hematology and Oncology

## 2015-12-03 ENCOUNTER — Ambulatory Visit: Payer: PPO

## 2015-12-03 ENCOUNTER — Other Ambulatory Visit: Payer: PPO

## 2015-12-03 ENCOUNTER — Ambulatory Visit: Payer: PPO | Admitting: Hematology and Oncology

## 2015-12-04 ENCOUNTER — Encounter: Payer: Self-pay | Admitting: Internal Medicine

## 2015-12-04 ENCOUNTER — Encounter: Payer: Self-pay | Admitting: Surgery

## 2015-12-04 ENCOUNTER — Ambulatory Visit (INDEPENDENT_AMBULATORY_CARE_PROVIDER_SITE_OTHER): Payer: PPO | Admitting: Surgery

## 2015-12-04 ENCOUNTER — Encounter: Payer: PPO | Attending: Surgery | Admitting: Surgery

## 2015-12-04 ENCOUNTER — Ambulatory Visit: Payer: PPO | Admitting: Surgery

## 2015-12-04 VITALS — BP 146/69 | HR 85 | Temp 97.8°F | Ht 60.0 in | Wt 107.0 lb

## 2015-12-04 DIAGNOSIS — L03323 Acute lymphangitis of chest wall: Secondary | ICD-10-CM | POA: Insufficient documentation

## 2015-12-04 DIAGNOSIS — Z853 Personal history of malignant neoplasm of breast: Secondary | ICD-10-CM | POA: Insufficient documentation

## 2015-12-04 DIAGNOSIS — C50919 Malignant neoplasm of unspecified site of unspecified female breast: Secondary | ICD-10-CM | POA: Diagnosis not present

## 2015-12-04 DIAGNOSIS — Z79899 Other long term (current) drug therapy: Secondary | ICD-10-CM | POA: Diagnosis not present

## 2015-12-04 DIAGNOSIS — Z7982 Long term (current) use of aspirin: Secondary | ICD-10-CM | POA: Diagnosis not present

## 2015-12-04 DIAGNOSIS — X58XXXA Exposure to other specified factors, initial encounter: Secondary | ICD-10-CM | POA: Insufficient documentation

## 2015-12-04 DIAGNOSIS — I10 Essential (primary) hypertension: Secondary | ICD-10-CM | POA: Diagnosis not present

## 2015-12-04 DIAGNOSIS — E119 Type 2 diabetes mellitus without complications: Secondary | ICD-10-CM | POA: Insufficient documentation

## 2015-12-04 DIAGNOSIS — I2510 Atherosclerotic heart disease of native coronary artery without angina pectoris: Secondary | ICD-10-CM | POA: Diagnosis not present

## 2015-12-04 DIAGNOSIS — S21001A Unspecified open wound of right breast, initial encounter: Secondary | ICD-10-CM | POA: Insufficient documentation

## 2015-12-04 NOTE — Progress Notes (Signed)
Dawn Wiley, Dawn Wiley ([id number]) Visit Report for 12/04/2015 Abuse/Suicide Risk Screen Details Patient Name: Dawn Wiley, Dawn Wiley Date of Service: 12/04/2015 8:45 AM Medical Record Number: [id number] Patient Account Number: [id number] Date of Birth/Sex: 06-26-1934 (81 y.o. Female) Treating RN: Montey Hora Primary Care Physician: Deborra Medina Other Clinician: Referring Physician: Nolon Stalls Treating Physician/Extender: Frann Rider in Treatment: 0 Abuse/Suicide Risk Screen Items Answer ABUSE/SUICIDE RISK SCREEN: Has anyone close to you tried to hurt or harm you recentlyo No Do you feel uncomfortable with anyone in your familyo No Has anyone forced you do things that you didnot want to doo No Do you have any thoughts of harming yourselfo No Patient displays signs or symptoms of abuse and/or neglect. No Electronic Signature(s) Signed: 12/04/2015 4:35:40 PM By: Montey Hora Entered By: Montey Hora on 12/04/2015 09:07:34 Dawn Wiley ([id number]) -------------------------------------------------------------------------------- Activities of Daily Living Details Patient Name: Dawn Wiley Date of Service: 12/04/2015 8:45 AM Medical Record Number: [id number] Patient Account Number: [id number] Date of Birth/Sex: 1934-11-01 (81 y.o. Female) Treating RN: Montey Hora Primary Care Physician: Deborra Medina Other Clinician: Referring Physician: Nolon Stalls Treating Physician/Extender: Frann Rider in Treatment: 0 Activities of Daily Living Items Answer Activities of Daily Living (Please select one for each item) Drive Automobile Completely Able Take Medications Completely Able Use Telephone Completely Able Care for Appearance Completely Able Use Toilet Completely Able Bath / Shower Completely Able Dress Self Completely Able Feed Self Completely Able Walk Completely Able Get In / Out Bed Completely Able Housework Completely Able Prepare Meals  Completely [place] for Self Completely Able Electronic Signature(s) Signed: 12/04/2015 4:35:40 PM By: Montey Hora Entered By: Montey Hora on 12/04/2015 09:07:55 Dawn Wiley ([id number]) -------------------------------------------------------------------------------- Education Assessment Details Patient Name: Dawn Wiley Date of Service: 12/04/2015 8:45 AM Medical Record Number: [id number] Patient Account Number: [id number] Date of Birth/Sex: Feb 11, 1935 (81 y.o. Female) Treating RN: Montey Hora Primary Care Physician: Deborra Medina Other Clinician: Referring Physician: Nolon Stalls Treating Physician/Extender: Frann Rider in Treatment: 0 Primary Learner Assessed: Patient Learning Preferences/Education Level/Primary Language Learning Preference: Explanation, Demonstration Highest Education Level: High School Preferred Language: English Cognitive Barrier Assessment/Beliefs Language Barrier: No Translator Needed: No Memory Deficit: No Emotional Barrier: No Cultural/Religious Beliefs Affecting Medical No Care: Physical Barrier Assessment Impaired Vision: No Impaired Hearing: No Decreased Hand dexterity: No Knowledge/Comprehension Assessment Knowledge Level: Medium Comprehension Level: Medium Ability to understand written Medium instructions: Ability to understand verbal Medium instructions: Motivation Assessment Anxiety Level: Calm Cooperation: Cooperative Education Importance: Acknowledges Need Interest in Health Problems: Asks Questions Perception: Coherent Willingness to Engage in Self- Medium Management Activities: Readiness to Engage in Self- Medium Management Activities: Electronic Signature(s) Dawn Wiley, Dawn Wiley ([id number]) Signed: 12/04/2015 4:35:40 PM By: Montey Hora Entered By: Montey Hora on 12/04/2015 09:08:15 Dawn Wiley  ([id number]) -------------------------------------------------------------------------------- Fall Risk Assessment Details Patient Name: Dawn Wiley Date of Service: 12/04/2015 8:45 AM Medical Record Number: [id number] Patient Account Number: [id number] Date of Birth/Sex: Jan 20, 1935 (81 y.o. Female) Treating RN: Montey Hora Primary Care Physician: Deborra Medina Other Clinician: Referring Physician: Nolon Stalls Treating Physician/Extender: Frann Rider in Treatment: 0 Fall Risk Assessment Items Have you had 2 or more falls in the last 12 monthso 0 No Have you had any fall that resulted in injury in the last 12 monthso 0 No FALL RISK ASSESSMENT: History of falling - immediate or within 3 months 0 No Secondary diagnosis 0 No Ambulatory aid None/bed rest/wheelchair/nurse 0 Yes Crutches/cane/walker 0 No Furniture 0  No IV Access/Saline Lock 0 No Gait/Training Normal/bed rest/immobile 0 Yes Weak 0 No Impaired 0 No Mental Status Oriented to own ability 0 Yes Electronic Signature(s) Signed: 12/04/2015 4:35:40 PM By: Montey Hora Entered By: Montey Hora on 12/04/2015 09:08:27 Dawn Wiley ([id number]) -------------------------------------------------------------------------------- Nutrition Risk Assessment Details Patient Name: Dawn Wiley Date of Service: 12/04/2015 8:45 AM Medical Record Number: [id number] Patient Account Number: [id number] Date of Birth/Sex: 1935-03-28 (81 y.o. Female) Treating RN: Montey Hora Primary Care Physician: Deborra Medina Other Clinician: Referring Physician: Nolon Stalls Treating Physician/Extender: Frann Rider in Treatment: 0 Height (in): 60 Weight (lbs): 108 Body Mass Index (BMI): 21.1 Nutrition Risk Assessment Items NUTRITION RISK SCREEN: I have an illness or condition that made me change the kind and/or 0 No amount of food I eat I eat fewer than two meals per day 0 No I eat few fruits and  vegetables, or milk products 0 No I have three or more drinks of beer, liquor or wine almost every day 0 No I have tooth or mouth problems that make it hard for me to eat 0 No I don't always have enough money to buy the food I need 0 No I eat alone most of the time 0 No I take three or more different prescribed or over-the-counter drugs a 1 Yes day Without wanting to, I have lost or gained 10 pounds in the last six 0 No months I am not always physically able to shop, cook and/or feed myself 0 No Nutrition Protocols Good Risk Protocol 0 No interventions needed Moderate Risk Protocol Electronic Signature(s) Signed: 12/04/2015 4:35:40 PM By: Montey Hora Entered By: Montey Hora on 12/04/2015 09:08:34

## 2015-12-04 NOTE — Patient Instructions (Signed)
We have arranged for you to have the Ultrasound done tomorrow. We would like for you to stop by the lab today for your blood work.  Please call our office if you have questions or concerns.

## 2015-12-04 NOTE — Progress Notes (Signed)
TANAYIA, THEARD ([id number]) Visit Report for 12/04/2015 Chief Complaint Document Details Patient Name: Dawn Wiley, Dawn Wiley Date of Service: 12/04/2015 8:45 AM Medical Record Number: [id number] Patient Account Number: [id number] Date of Birth/Sex: 1934/06/01 (81 y.o. Female) Treating RN: Montey Hora Primary Care Physician: Deborra Medina Other Clinician: Referring Physician: Nolon Stalls Treating Physician/Extender: Frann Rider in Treatment: 0 Information Obtained from: Patient Chief Complaint Patient presents to the wound care center for a consult due non healing wound to the right chest wall and back which she's had for about 4 months now. Electronic Signature(s) Signed: 12/04/2015 10:32:05 AM By: Christin Fudge MD, FACS Entered By: Christin Fudge on 12/04/2015 10:32:05 Dawn Wiley ([id number]) -------------------------------------------------------------------------------- HPI Details Patient Name: Dawn Wiley Date of Service: 12/04/2015 8:45 AM Medical Record Number: [id number] Patient Account Number: [id number] Date of Birth/Sex: March 22, 1935 (81 y.o. Female) Treating RN: Montey Hora Primary Care Physician: Deborra Medina Other Clinician: Referring Physician: Nolon Stalls Treating Physician/Extender: Frann Rider in Treatment: 0 History of Present Illness Location: multiple areas of metastatic breast cancer over her right anterior chest wall posterior thoracic wall and axilla Quality: Patient reports experiencing a dull pain to affected area(s). Severity: Patient states wound are getting worse. Duration: Patient has had the wound for > 3 months prior to seeking treatment at the wound center Timing: Pain in wound is Intermittent (comes and goes Context: The wound appeared gradually over time Modifying Factors: Other treatment(s) tried include:Radiation therapy and chemotherapy Associated Signs and Symptoms: Patient reports having increase  discharge. HPI Description: 81 year old patient was been recently seen by her PCP Dr. Derrel Nip in early August for the chief complaints of urinary frequency, diabetes mellitus( hemoglobin A1c was 5.6%), acute recurrent cystitis, essential hypertension and metastatic skin lesions from a stage IV triple negative breast cancer. On examination she was noted to have a fungating mass covering the chest wall and a right upper arm. he is receiving palliative radiation to the painful sites of disease. Was referred to [place] for palliative care at the wound care center. in view of Dr. Kem Parkinson medical oncology note reveals -- past medical history of anemia, breast cancer, coronary artery disease, diabetes mellitus without complications, hypertension, tachycardia. status post breast surgery and biopsy o3, Right modified radical mastectomy in May 2016, Port-A-Cath placement and then removal. Electronic Signature(s) Signed: 12/04/2015 10:39:37 AM By: Christin Fudge MD, FACS Previous Signature: 12/04/2015 9:20:28 AM Version By: Christin Fudge MD, FACS Previous Signature: 12/04/2015 9:20:14 AM Version By: Christin Fudge MD, FACS Entered By: Christin Fudge on 12/04/2015 10:39:35 Dawn Wiley ([id number]) -------------------------------------------------------------------------------- Physical Exam Details Patient Name: Dawn Wiley Date of Service: 12/04/2015 8:45 AM Medical Record Number: [id number] Patient Account Number: [id number] Date of Birth/Sex: May 29, 1934 (81 y.o. Female) Treating RN: Montey Hora Primary Care Physician: Deborra Medina Other Clinician: Referring Physician: Nolon Stalls Treating Physician/Extender: Frann Rider in Treatment: 0 Constitutional . Pulse regular. Respirations normal and unlabored. Afebrile. . Eyes Nonicteric. Reactive to light. Ears, Nose, Mouth, and Throat Lips, teeth, and gums WNL.[name] Kitchen Moist mucosa without lesions. Neck supple and nontender. No palpable  supraclavicular or cervical adenopathy. Normal sized without goiter. Respiratory WNL. No retractions.. Cardiovascular Pedal Pulses WNL. No clubbing, cyanosis or edema. Chest stents of metastatic disease right chest wall, axilla and right upper back. Gastrointestinal (GI) Abdomen without masses or tenderness.. No liver or spleen enlargement or tenderness.. Lymphatic No adneopathy. No adenopathy. No adenopathy. Musculoskeletal Adexa without tenderness or enlargement.. Digits and nails w/o clubbing, cyanosis, infection, petechiae, ischemia, or inflammatory  conditions.. Integumentary (Hair, Skin) No suspicious lesions. No crepitus or fluctuance. No peri-wound warmth or erythema. No masses.[name] Kitchen Psychiatric Judgement and insight Intact.. No evidence of depression, anxiety, or agitation.. Notes patient has extensive metastatic disease with large tumors some of them fungating on the right anterior chest wall axillary region and right upper back. Minimal drainage of fluid at the present time. Electronic Signature(s) Signed: 12/04/2015 10:41:05 AM By: Christin Fudge MD, FACS Entered By: Christin Fudge on 12/04/2015 10:41:03 Dawn Wiley ([id number]) -------------------------------------------------------------------------------- Physician Orders Details Patient Name: Dawn Wiley Date of Service: 12/04/2015 8:45 AM Medical Record Number: [id number] Patient Account Number: [id number] Date of Birth/Sex: 13-Sep-1934 (81 y.o. Female) Treating RN: Montey Hora Primary Care Physician: Deborra Medina Other Clinician: Referring Physician: Nolon Stalls Treating Physician/Extender: Frann Rider in Treatment: 0 Verbal / Phone Orders: Yes Clinician: Montey Hora Read Back and Verified: Yes Diagnosis Coding Wound Cleansing Wound #1 Right Upper Arm o Clean wound with Normal Saline. o May Shower, gently pat wound dry prior to applying new dressing. Wound #2 Midline Chest o  Clean wound with Normal Saline. o May Shower, gently pat wound dry prior to applying new dressing. Primary Wound Dressing Wound #1 Right Upper Arm o Aquacel Ag Wound #2 Midline Chest o Aquacel Ag Secondary Dressing Wound #1 Right Upper Arm o ABD pad - secure with netting Wound #2 Midline Chest o ABD pad - secure with netting Dressing Change Frequency Wound #1 Right Upper Arm o Change dressing every day. Wound #2 Midline Chest o Change dressing every day. Follow-up Appointments Wound #1 Right Upper Arm o Return Appointment in 1 month Wound #2 Midline Chest o Return Appointment in 1 month IBIS, STEVEN ([id number]) Electronic Signature(s) Signed: 12/04/2015 11:58:06 AM By: Christin Fudge MD, FACS Signed: 12/04/2015 4:35:40 PM By: Montey Hora Entered By: Montey Hora on 12/04/2015 09:54:38 Dawn Wiley ([id number]) -------------------------------------------------------------------------------- Problem List Details Patient Name: Dawn Wiley Date of Service: 12/04/2015 8:45 AM Medical Record Number: [id number] Patient Account Number: [id number] Date of Birth/Sex: 17-Aug-1934 (81 y.o. Female) Treating RN: Montey Hora Primary Care Physician: Deborra Medina Other Clinician: Referring Physician: Nolon Stalls Treating Physician/Extender: Frann Rider in Treatment: 0 Active Problems ICD-10 Encounter Code Description Active Date Diagnosis L03.323 Acute lymphangitis of chest wall 12/04/2015 Yes Z85.3 Personal history of malignant neoplasm of breast 12/04/2015 Yes S21.001A Unspecified open wound of right breast, initial encounter 12/04/2015 Yes Inactive Problems Resolved Problems Electronic Signature(s) Signed: 12/04/2015 10:28:58 AM By: Christin Fudge MD, FACS Previous Signature: 12/04/2015 10:05:33 AM Version By: Christin Fudge MD, FACS Entered By: Christin Fudge on 12/04/2015 10:28:58 Dawn Wiley  ([id number]) -------------------------------------------------------------------------------- Progress Note Details Patient Name: Dawn Wiley Date of Service: 12/04/2015 8:45 AM Medical Record Number: [id number] Patient Account Number: [id number] Date of Birth/Sex: February 09, 1935 (81 y.o. Female) Treating RN: Montey Hora Primary Care Physician: Deborra Medina Other Clinician: Referring Physician: Nolon Stalls Treating Physician/Extender: Frann Rider in Treatment: 0 Subjective Chief Complaint Information obtained from Patient Patient presents to the wound care center for a consult due non healing wound to the right chest wall and back which she's had for about 4 months now. History of Present Illness (HPI) The following HPI elements were documented for the patient's wound: Location: multiple areas of metastatic breast cancer over her right anterior chest wall posterior thoracic wall and axilla Quality: Patient reports experiencing a dull pain to affected area(s). Severity: Patient states wound are getting worse. Duration: Patient has had the wound for > 3 months  prior to seeking treatment at the wound center Timing: Pain in wound is Intermittent (comes and goes Context: The wound appeared gradually over time Modifying Factors: Other treatment(s) tried include:Radiation therapy and chemotherapy Associated Signs and Symptoms: Patient reports having increase discharge. 81 year old patient was been recently seen by her PCP Dr. Derrel Nip in early August for the chief complaints of urinary frequency, diabetes mellitus( hemoglobin A1c was 5.6%), acute recurrent cystitis, essential hypertension and metastatic skin lesions from a stage IV triple negative breast cancer. On examination she was noted to have a fungating mass covering the chest wall and a right upper arm. he is receiving palliative radiation to the painful sites of disease. Was referred to [place] for palliative care at the  wound care center. in view of Dr. Kem Parkinson medical oncology note reveals -- past medical history of anemia, breast cancer, coronary artery disease, diabetes mellitus without complications, hypertension, tachycardia. status post breast surgery and biopsy o3, Right modified radical mastectomy in May 2016, Port-A-Cath placement and then removal. Wound History Patient presents with 3 open wounds that have been present for approximately 3 or more weeks. Patient has been treating wounds in the following manner: dry dressing. Laboratory tests have been performed in the last month. Patient reportedly has not tested positive for an antibiotic resistant organism. Patient reportedly has not tested positive for osteomyelitis. Patient reportedly has not had testing performed to evaluate circulation in the legs. Patient History Information obtained from Patient. DAJONAE, DIMSDALE ([id number]) Allergies No Known Allergies Social History Never smoker, Marital Status - Widowed, Alcohol Use - Never, Drug Use - No History, Caffeine Use - Never. Medical History Hematologic/Lymphatic Patient has history of Anemia - with chemo Cardiovascular Patient has history of Hypertension Oncologic Patient has history of Received Chemotherapy, Received Radiation Medical And Surgical History Notes Cardiovascular tachycardia Endocrine last A1C 5.5 and patient was taken off DM medications and is no longer checking CBG - A1C was under 6 for over a year before this decision was made by PCP Oncologic mastectomy right side Review of Systems (ROS) Constitutional Symptoms (General Health) The patient has no complaints or symptoms. Eyes Complains or has symptoms of Glasses / Contacts. Ear/Nose/Mouth/Throat The patient has no complaints or symptoms. Hematologic/Lymphatic The patient has no complaints or symptoms. Respiratory The patient has no complaints or symptoms. Cardiovascular The patient has no  complaints or symptoms. Gastrointestinal The patient has no complaints or symptoms. Endocrine The patient has no complaints or symptoms. Genitourinary Complains or has symptoms of Incontinence/dribbling. Immunological The patient has no complaints or symptoms. Integumentary (Skin) The patient has no complaints or symptoms. Musculoskeletal The patient has no complaints or symptoms. Neurologic ELKY, GUILLORY ([id number]) The patient has no complaints or symptoms. Psychiatric The patient has no complaints or symptoms. Medications Vitamin B-12 oral 1 1 tablet oral two times daily Tylenol 325 mg tablet oral tablet oral as needed Benadryl 25 mg capsule oral capsule oral as needed metoprolol succinate ER 25 mg tablet,extended release 24 hr oral 1 1 tablet extended release 24 hr oral two times daily amlodipine 5 mg tablet oral 1 1 tablet oral daily Calcium 600 600 mg calcium (1,500 mg) tablet oral 2 2 tablets oral (1200) daily ferrous sulfate 324 mg (65 mg iron) tablet,delayed release oral 1 1 tablet,delayed release (DR/EC) oral daily hydrocodone 7.5 mg-acetaminophen 325 mg tablet oral 1 1 tablet oral every six hours as needed aspirin 81 mg tablet,delayed release oral 1 1 tablet,delayed release (DR/EC) oral every other day temazepam 15 mg  capsule oral 1 1 capsule oral Vitamin B-12 1,000 mcg/mL injection solution injection solution injection monthly Vitamin D3 400 unit tablet oral 2 2 tablet oral daily Objective Constitutional Pulse regular. Respirations normal and unlabored. Afebrile. Vitals Time Taken: 8:58 AM, Height: 60 in, Source: Stated, Weight: 108 lbs, Source: Stated, BMI: 21.1, Temperature: 98.0 F, Pulse: 82 bpm, Respiratory Rate: 18 breaths/min, Blood Pressure: 127/50 mmHg. Eyes Nonicteric. Reactive to light. Ears, Nose, Mouth, and Throat Lips, teeth, and gums WNL.[name] Kitchen Moist mucosa without lesions. Neck supple and nontender. No palpable supraclavicular or cervical  adenopathy. Normal sized without goiter. Respiratory WNL. No retractions.. Cardiovascular Pedal Pulses WNL. No clubbing, cyanosis or edema. AIDY, MEGNA ([id number]) Chest stents of metastatic disease right chest wall, axilla and right upper back. Gastrointestinal (GI) Abdomen without masses or tenderness.. No liver or spleen enlargement or tenderness.. Lymphatic No adneopathy. No adenopathy. No adenopathy. Musculoskeletal Adexa without tenderness or enlargement.. Digits and nails w/o clubbing, cyanosis, infection, petechiae, ischemia, or inflammatory conditions.[name] Kitchen Psychiatric Judgement and insight Intact.. No evidence of depression, anxiety, or agitation.. General Notes: patient has extensive metastatic disease with large tumors some of them fungating on the right anterior chest wall axillary region and right upper back. Minimal drainage of fluid at the present time. Integumentary (Hair, Skin) No suspicious lesions. No crepitus or fluctuance. No peri-wound warmth or erythema. No masses.. Wound #1 status is Open. Original cause of wound was Other Lesion. The wound is located on the Right Upper Arm. The wound measures 5.5cm length x 7.5cm width x 0.1cm depth; 32.398cm^2 area and 3.24cm^3 volume. The wound is limited to skin breakdown. There is no tunneling or undermining noted. There is a large amount of sanguinous drainage noted. The wound margin is flat and intact. There is medium (34-66%) red, pink granulation within the wound bed. There is a medium (34-66%) amount of necrotic tissue within the wound bed including Adherent Slough. The periwound skin appearance did not exhibit: Callus, Crepitus, Excoriation, Fluctuance, Friable, Induration, Localized Edema, Rash, Scarring, Dry/Scaly, Maceration, Moist, Atrophie Blanche, Cyanosis, Ecchymosis, Hemosiderin Staining, Mottled, Pallor, Rubor, Erythema. Periwound temperature was noted as No Abnormality. Wound #2 status is Open. Original  cause of wound was Other Lesion. The wound is located on the Midline Chest. The wound measures 9.5cm length x 12cm width x 0.2cm depth; 89.535cm^2 area and 17.907cm^3 volume. The wound is limited to skin breakdown. There is no tunneling or undermining noted. There is a medium amount of sanguinous drainage noted. The wound margin is flat and intact. There is medium (34- 66%) red, pink granulation within the wound bed. There is a medium (34-66%) amount of necrotic tissue within the wound bed including Adherent Slough. The periwound skin appearance did not exhibit: Callus, Crepitus, Excoriation, Fluctuance, Friable, Induration, Localized Edema, Rash, Scarring, Dry/Scaly, Maceration, Moist, Atrophie Blanche, Cyanosis, Ecchymosis, Hemosiderin Staining, Mottled, Pallor, Rubor, Erythema. Assessment KIMMI, SCARDINO ([id number]) Active Problems ICD-10 L03.323 - Acute lymphangitis of chest wall Z85.3 - Personal history of malignant neoplasm of breast S21.001A - Unspecified open wound of right breast, initial encounter this 81 year old patient with stage IV metastatic breast cancer which has now spread to her chest wall and back is here for palliative care. After a thorough review and discussing with the patient and her daughter who is at the bedside I have recommended: 1. alginate over the areas which are open and covered with ABDs pads and a light dressing to hold it in place. 2. Return as often as needed for palliative care 2-3 weeks  3. Continue with her medical oncologist for all it is related to chemotherapy and palliative pain control She and her daughter have had all questions answered Plan Wound Cleansing: Wound #1 Right Upper Arm: Clean wound with Normal Saline. May Shower, gently pat wound dry prior to applying new dressing. Wound #2 Midline Chest: Clean wound with Normal Saline. May Shower, gently pat wound dry prior to applying new dressing. Primary Wound Dressing: Wound #1  Right Upper Arm: Aquacel Ag Wound #2 Midline Chest: Aquacel Ag Secondary Dressing: Wound #1 Right Upper Arm: ABD pad - secure with netting Wound #2 Midline Chest: ABD pad - secure with netting Dressing Change Frequency: Wound #1 Right Upper Arm: Change dressing every day. Wound #2 Midline Chest: Change dressing every day. MARAI, MAROS ([id number]) Follow-up Appointments: Wound #1 Right Upper Arm: Return Appointment in 1 month Wound #2 Midline Chest: Return Appointment in 1 month this 81 year old patient with stage IV metastatic breast cancer which has now spread to her chest wall and back is here for palliative care. After a thorough review and discussing with the patient and her daughter who is at the bedside I have recommended: 1. alginate over the areas which are open and covered with ABDs pads and a light dressing to hold it in place. 2. Return as often as needed for palliative care 2-3 weeks 3. Continue with her medical oncologist for all it is related to chemotherapy and palliative pain control She and her daughter have had all questions answered Electronic Signature(s) Signed: 12/04/2015 10:43:44 AM By: Christin Fudge MD, FACS Entered By: Christin Fudge on 12/04/2015 10:43:44 Dawn Wiley ([id number]) -------------------------------------------------------------------------------- ROS/PFSH Details Patient Name: Dawn Wiley Date of Service: 12/04/2015 8:45 AM Medical Record Number: [id number] Patient Account Number: [id number] Date of Birth/Sex: 17-Apr-1935 (81 y.o. Female) Treating RN: Montey Hora Primary Care Physician: Deborra Medina Other Clinician: Referring Physician: Nolon Stalls Treating Physician/Extender: Frann Rider in Treatment: 0 Information Obtained From Patient Wound History Do you currently have one or more open woundso Yes How many open wounds do you currently haveo 3 Approximately how long have you had your woundso 3  or more weeks How have you been treating your wound(s) until nowo dry dressing Has your wound(s) ever healed and then re-openedo No Have you had any lab work done in the past montho Yes Who ordered the lab work doneo cancer center Have you tested positive for an antibiotic resistant organism (MRSA, VRE)o No Have you tested positive for osteomyelitis (bone infection)o No Have you had any tests for circulation on your legso No Eyes Complaints and Symptoms: Positive for: Glasses / Contacts Genitourinary Complaints and Symptoms: Positive for: Incontinence/dribbling Constitutional Symptoms (General Health) Complaints and Symptoms: No Complaints or Symptoms Ear/Nose/Mouth/Throat Complaints and Symptoms: No Complaints or Symptoms Hematologic/Lymphatic Complaints and Symptoms: No Complaints or Symptoms Medical History: Positive for: Anemia - with chemo ROSHAN, VANDERHEIDEN ([id number]) Respiratory Complaints and Symptoms: No Complaints or Symptoms Cardiovascular Complaints and Symptoms: No Complaints or Symptoms Medical History: Positive for: Hypertension Past Medical History Notes: tachycardia Gastrointestinal Complaints and Symptoms: No Complaints or Symptoms Endocrine Complaints and Symptoms: No Complaints or Symptoms Medical History: Past Medical History Notes: last A1C 5.5 and patient was taken off DM medications and is no longer checking CBG - A1C was under 6 for over a year before this decision was made by PCP Immunological Complaints and Symptoms: No Complaints or Symptoms Integumentary (Skin) Complaints and Symptoms: No Complaints or Symptoms Musculoskeletal Complaints and Symptoms: No Complaints  or Symptoms Neurologic Complaints and Symptoms: No Complaints or Symptoms Oncologic Medical History: Positive for: Received Chemotherapy; Received Radiation KYAH, THREET ([id number]) Past Medical History Notes: mastectomy right side Psychiatric Complaints  and Symptoms: No Complaints or Symptoms Immunizations Immunization Notes: up to date Family and Social History Never smoker; Marital Status - Widowed; Alcohol Use: Never; Drug Use: No History; Caffeine Use: Never; Financial Concerns: No; Food, Clothing or Shelter Needs: No; Support System Lacking: No; Transportation Concerns: No; Advanced Directives: No; Patient does not want information on Advanced Directives Physician Affirmation I have reviewed and agree with the above information. Electronic Signature(s) Signed: 12/04/2015 11:58:06 AM By: Christin Fudge MD, FACS Signed: 12/04/2015 4:35:40 PM By: Montey Hora Entered By: Christin Fudge on 12/04/2015 09:09:27 Dawn Wiley ([id number]) -------------------------------------------------------------------------------- SuperBill Details Patient Name: Dawn Wiley Date of Service: 12/04/2015 Medical Record Number: [id number] Patient Account Number: [id number] Date of Birth/Sex: [birth year] (81 y.o. Female) Treating RN: Montey Hora Primary Care Physician: Deborra Medina Other Clinician: Referring Physician: Nolon Stalls Treating Physician/Extender: Frann Rider in Treatment: 0 Diagnosis Coding ICD-10 Codes Code Description [phone] Acute lymphangitis of chest wall Z85.3 Personal history of malignant neoplasm of breast S21.001A Unspecified open wound of right breast, initial encounter Facility Procedures CPT4 Code: [id number] Description: [id number] - WOUND CARE VISIT-LEV 5 EST PT Modifier: Quantity: 1 Physician Procedures CPT4 Code: [id number] Description: [id number] - WC PHYS LEVEL 4 - NEW PT ICD-10 Description Diagnosis L03.323 Acute lymphangitis of chest wall Z85.3 Personal history of malignant neoplasm of breast S21.001A Unspecified open wound of right breast, initial Modifier: encounter Quantity: 1 Electronic Signature(s) Signed: 12/04/2015 10:43:59 AM By: Christin Fudge MD, FACS Entered By: Christin Fudge on 12/04/2015  10:43:58

## 2015-12-04 NOTE — Progress Notes (Signed)
NORMANI, BORNTREGER ([id number]) Visit Report for 12/04/2015 Allergy List Details Patient Name: KARLINE, PEPPE Date of Service: 12/04/2015 8:45 AM Medical Record Number: [id number] Patient Account Number: [id number] Date of Birth/Sex: 10/13/1934 (81 y.o. Female) Treating RN: Montey Hora Primary Care Physician: Deborra Medina Other Clinician: Referring Physician: Nolon Stalls Treating Physician/Extender: Frann Rider in Treatment: 0 Allergies Active Allergies No Known Allergies Allergy Notes Electronic Signature(s) Signed: 12/04/2015 4:35:40 PM By: Montey Hora Entered By: Montey Hora on 12/04/2015 09:02:51 Dawn Wiley ([id number]) -------------------------------------------------------------------------------- Arrival Information Details Patient Name: Dawn Wiley Date of Service: 12/04/2015 8:45 AM Medical Record Number: [id number] Patient Account Number: [id number] Date of Birth/Sex: 1934/08/20 (81 y.o. Female) Treating RN: Montey Hora Primary Care Physician: Deborra Medina Other Clinician: Referring Physician: Nolon Stalls Treating Physician/Extender: Frann Rider in Treatment: 0 Visit Information Patient Arrived: Ambulatory Arrival Time: 08:56 Accompanied By: dtr Transfer Assistance: None Patient Identification Verified: Yes Secondary Verification Process Yes Completed: Patient Has Alerts: Yes Patient Alerts: NO BP IN RIGHT ARM Electronic Signature(s) Signed: 12/04/2015 4:35:40 PM By: Montey Hora Entered By: Montey Hora on 12/04/2015 08:58:42 Dawn Wiley ([id number]) -------------------------------------------------------------------------------- Clinic Level of Care Assessment Details Patient Name: Dawn Wiley Date of Service: 12/04/2015 8:45 AM Medical Record Number: [id number] Patient Account Number: [id number] Date of Birth/Sex: Jul 22, 1934 (81 y.o. Female) Treating RN: Montey Hora Primary Care  Physician: Deborra Medina Other Clinician: Referring Physician: Nolon Stalls Treating Physician/Extender: Frann Rider in Treatment: 0 Clinic Level of Care Assessment Items TOOL 2 Quantity Score []  - Use when only an EandM is performed on the INITIAL visit 0 ASSESSMENTS - Nursing Assessment / Reassessment X - General Physical Exam (combine w/ comprehensive assessment (listed just 1 20 below) when performed on new pt. evals) X - Comprehensive Assessment (HX, ROS, Risk Assessments, Wounds Hx, etc.) 1 25 ASSESSMENTS - Wound and Skin Assessment / Reassessment []  - Simple Wound Assessment / Reassessment - one wound 0 X - Complex Wound Assessment / Reassessment - multiple wounds 2 5 []  - Dermatologic / Skin Assessment (not related to wound area) 0 ASSESSMENTS - Ostomy and/or Continence Assessment and Care []  - Incontinence Assessment and Management 0 []  - Ostomy Care Assessment and Management (repouching, etc.) 0 PROCESS - Coordination of Care X - Simple Patient / Family Education for ongoing care 1 15 []  - Complex (extensive) Patient / Family Education for ongoing care 0 X - Staff obtains Programmer, systems, Records, Test Results / Process Orders 1 10 []  - Staff telephones HHA, Nursing Homes / Clarify orders / etc 0 []  - Routine Transfer to another Facility (non-emergent condition) 0 []  - Routine Hospital Admission (non-emergent condition) 0 X - New Admissions / Biomedical engineer / Ordering NPWT, Apligraf, etc. 1 15 []  - Emergency Hospital Admission (emergent condition) 0 X - Simple Discharge Coordination 1 10 TYRICA, STETSER. ([id number]) []  - Complex (extensive) Discharge Coordination 0 PROCESS - Special Needs []  - Pediatric / Minor Patient Management 0 []  - Isolation Patient Management 0 []  - Hearing / Language / Visual special needs 0 []  - Assessment of Community assistance (transportation, D/C planning, etc.) 0 []  - Additional assistance / Altered mentation 0 []  -  Support Surface(s) Assessment (bed, cushion, seat, etc.) 0 INTERVENTIONS - Wound Cleansing / Measurement X - Wound Imaging (photographs - any number of wounds) 1 5 []  - Wound Tracing (instead of photographs) 0 []  - Simple Wound Measurement - one wound 0 X - Complex Wound Measurement - multiple wounds 2 5 []  -  Simple Wound Cleansing - one wound 0 X - Complex Wound Cleansing - multiple wounds 2 5 INTERVENTIONS - Wound Dressings []  - Small Wound Dressing one or multiple wounds 0 X - Medium Wound Dressing one or multiple wounds 2 15 []  - Large Wound Dressing one or multiple wounds 0 []  - Application of Medications - injection 0 INTERVENTIONS - Miscellaneous []  - External ear exam 0 []  - Specimen Collection (cultures, biopsies, blood, body fluids, etc.) 0 []  - Specimen(s) / Culture(s) sent or taken to Lab for analysis 0 []  - Patient Transfer (multiple staff / Harrel Lemon Lift / Similar devices) 0 []  - Simple Staple / Suture removal (25 or less) 0 []  - Complex Staple / Suture removal (26 or more) 0 Kruser, Onnie J. ([id number]) []  - Hypo / Hyperglycemic Management (close monitor of Blood Glucose) 0 []  - Ankle / Brachial Index (ABI) - do not check if billed separately 0 Has the patient been seen at the hospital within the last three years: Yes Total Score: 160 Level Of Care: New/Established - Level 5 Electronic Signature(s) Signed: 12/04/2015 4:35:40 PM By: Montey Hora Entered By: Montey Hora on 12/04/2015 09:53:03 Dawn Wiley ([id number]) -------------------------------------------------------------------------------- Encounter Discharge Information Details Patient Name: Dawn Wiley Date of Service: 12/04/2015 8:45 AM Medical Record Number: [id number] Patient Account Number: [id number] Date of Birth/Sex: 1934/10/08 (81 y.o. Female) Treating RN: Montey Hora Primary Care Physician: Deborra Medina Other Clinician: Referring Physician: Nolon Stalls Treating  Physician/Extender: Frann Rider in Treatment: 0 Encounter Discharge Information Items Discharge Pain Level: 0 Discharge Condition: Stable Ambulatory Status: Ambulatory Discharge Destination: Home Transportation: Private Auto Accompanied By: dtr Schedule Follow-up Appointment: Yes Medication Reconciliation completed and provided to Patient/Care No Maryelizabeth Eberle: Provided on Clinical Summary of Care: 12/04/2015 Form Type Recipient Paper Patient ST Electronic Signature(s) Signed: 12/04/2015 10:18:24 AM By: Ruthine Dose Entered By: Ruthine Dose on 12/04/2015 10:18:23 Dawn Wiley ([id number]) -------------------------------------------------------------------------------- Multi Wound Chart Details Patient Name: Dawn Wiley Date of Service: 12/04/2015 8:45 AM Medical Record Number: [id number] Patient Account Number: [id number] Date of Birth/Sex: [birth year] (81 y.o. Female) Treating RN: Montey Hora Primary Care Physician: Deborra Medina Other Clinician: Referring Physician: Nolon Stalls Treating Physician/Extender: Frann Rider in Treatment: 0 Vital Signs Height(in): 60 Pulse(bpm): 82 Weight(lbs): 108 Blood Pressure 127/50 (mmHg): Body Mass Index(BMI): 21 Temperature(F): 98.0 Respiratory Rate 18 (breaths/min): Photos: [N/A:N/A] Wound Location: Right Upper Arm Chest - Midline N/A Wounding Event: Other Lesion Other Lesion N/A Primary Etiology: Malignant Wound Malignant Wound N/A Comorbid History: Anemia, Hypertension, Anemia, Hypertension, N/A Received Chemotherapy, Received Chemotherapy, Received Radiation Received Radiation Date Acquired: 10/01/2015 10/29/2015 N/A Weeks of Treatment: 0 0 N/A Wound Status: Open Open N/A Measurements L x W x D 5.5x7.5x0.1 9.5x12x0.2 N/A (cm) Area (cm) : 32.398 89.535 N/A Volume (cm) : 3.24 17.907 N/A % Reduction in Area: 0.00% 0.00% N/A % Reduction in Volume: 0.00% 0.00% N/A Classification: Partial  Thickness Partial Thickness N/A Exudate Amount: Large Medium N/A Exudate Type: Sanguinous Sanguinous N/A Exudate Color: red red N/A Wound Margin: Flat and Intact Flat and Intact N/A Granulation Amount: Medium (34-66%) Medium (34-66%) N/A Granulation Quality: Red, Pink Red, Pink N/A Necrotic Amount: Medium (34-66%) Medium (34-66%) N/A NESA, SZOSTEK. ([id number]) Exposed Structures: Fascia: No Fascia: No N/A Fat: No Fat: No Tendon: No Tendon: No Muscle: No Muscle: No Joint: No Joint: No Bone: No Bone: No Limited to Skin Limited to Skin Breakdown Breakdown Epithelialization: Large (67-100%) Large (67-100%) N/A Periwound Skin Texture: Edema: No Edema: No N/A  Excoriation: No Excoriation: No Induration: No Induration: No Callus: No Callus: No Crepitus: No Crepitus: No Fluctuance: No Fluctuance: No Friable: No Friable: No Rash: No Rash: No Scarring: No Scarring: No Periwound Skin Maceration: No Maceration: No N/A Moisture: Moist: No Moist: No Dry/Scaly: No Dry/Scaly: No Periwound Skin Color: Atrophie Blanche: No Atrophie Blanche: No N/A Cyanosis: No Cyanosis: No Ecchymosis: No Ecchymosis: No Erythema: No Erythema: No Hemosiderin Staining: No Hemosiderin Staining: No Mottled: No Mottled: No Pallor: No Pallor: No Rubor: No Rubor: No Temperature: No Abnormality N/A N/A Tenderness on No No N/A Palpation: Wound Preparation: Ulcer Cleansing: Ulcer Cleansing: N/A Rinsed/Irrigated with Rinsed/Irrigated with Saline Saline Topical Anesthetic Topical Anesthetic Applied: None Applied: None Treatment Notes Electronic Signature(s) Signed: 12/04/2015 9:40:17 AM By: Montey Hora Entered By: Montey Hora on 12/04/2015 09:40:17 Dawn Wiley ([id number]) -------------------------------------------------------------------------------- Multi-Disciplinary Care Plan Details Patient Name: Dawn Wiley Date of Service: 12/04/2015 8:45 AM Medical  Record Number: [id number] Patient Account Number: [id number] Date of Birth/Sex: 10-31-1934 (81 y.o. Female) Treating RN: Montey Hora Primary Care Physician: Deborra Medina Other Clinician: Referring Physician: Nolon Stalls Treating Physician/Extender: Frann Rider in Treatment: 0 Active Inactive Malignancy/Atypical Etiology Nursing Diagnoses: Knowledge deficit related to disease process and management of malignancy Goals: Patient/caregiver will verbalize understanding of disease process and disease management of malignancy Date Initiated: 12/04/2015 Goal Status: Active Interventions: Provide education on malignant ulcerations Notes: Orientation to the Wound Care Program Nursing Diagnoses: Knowledge deficit related to the wound healing center program Goals: Patient/caregiver will verbalize understanding of the [place] Program Date Initiated: 12/04/2015 Goal Status: Active Interventions: Provide education on orientation to the wound center Notes: Wound/Skin Impairment Nursing Diagnoses: Impaired tissue integrity Goals: Patient/caregiver will verbalize understanding of skin care regimen Date Initiated: 12/04/2015 Dawn Wiley ([id number]) Goal Status: Active Interventions: Assess patient/caregiver ability to obtain necessary supplies Assess patient/caregiver ability to perform ulcer/skin care regimen upon admission and as needed Assess ulceration(s) every visit Provide education on ulcer and skin care Notes: Electronic Signature(s) Signed: 12/04/2015 9:40:00 AM By: Montey Hora Entered By: Montey Hora on 12/04/2015 09:39:59 Dawn Wiley ([id number]) -------------------------------------------------------------------------------- Pain Assessment Details Patient Name: Dawn Wiley Date of Service: 12/04/2015 8:45 AM Medical Record Number: [id number] Patient Account Number: [id number] Date of Birth/Sex: 1934/09/19 (81 y.o.  Female) Treating RN: Montey Hora Primary Care Physician: Deborra Medina Other Clinician: Referring Physician: Nolon Stalls Treating Physician/Extender: Frann Rider in Treatment: 0 Active Problems Location of Pain Severity and Description of Pain Patient Has Paino No Site Locations Pain Management and Medication Current Pain Management: Notes Topical or injectable lidocaine is offered to patient for acute pain when surgical debridement is performed. If needed, Patient is instructed to use over the counter pain medication for the following 24-48 hours after debridement. Wound care MDs do not prescribed pain medications. Patient has chronic pain or uncontrolled pain. Patient has been instructed to make an appointment with their Primary Care Physician for pain management. Electronic Signature(s) Signed: 12/04/2015 4:35:40 PM By: Montey Hora Entered By: Montey Hora on 12/04/2015 08:58:58 Dawn Wiley ([id number]) -------------------------------------------------------------------------------- Patient/Caregiver Education Details Patient Name: Dawn Wiley Date of Service: 12/04/2015 8:45 AM Medical Record Number: [id number] Patient Account Number: [id number] Date of Birth/Gender: 11/27/1934 (81 y.o. Female) Treating RN: Montey Hora Primary Care Physician: Deborra Medina Other Clinician: Referring Physician: Nolon Stalls Treating Physician/Extender: Frann Rider in Treatment: 0 Education Assessment Education Provided To: Patient and Caregiver Education Topics Provided Wound/Skin Impairment: Handouts: Other: wound care as ordered Methods: Demonstration,  Explain/Verbal Responses: State content correctly Electronic Signature(s) Signed: 12/04/2015 4:35:40 PM By: Montey Hora Entered By: Montey Hora on 12/04/2015 09:41:21 Dawn Wiley  ([id number]) -------------------------------------------------------------------------------- Wound Assessment Details Patient Name: Dawn Wiley Date of Service: 12/04/2015 8:45 AM Medical Record Number: [id number] Patient Account Number: [id number] Date of Birth/Sex: [birth year] (81 y.o. Female) Treating RN: Montey Hora Primary Care Physician: Deborra Medina Other Clinician: Referring Physician: Nolon Stalls Treating Physician/Extender: Frann Rider in Treatment: 0 Wound Status Wound Number: 1 Primary Malignant Wound Etiology: Wound Location: Right Upper Arm Wound Open Wounding Event: Other Lesion Status: Date Acquired: 10/01/2015 Comorbid Anemia, Hypertension, Received Weeks Of Treatment: 0 History: Chemotherapy, Received Radiation Clustered Wound: No Photos Wound Measurements Length: (cm) 5.5 Width: (cm) 7.5 Depth: (cm) 0.1 Area: (cm) 32.398 Volume: (cm) 3.24 % Reduction in Area: 0% % Reduction in Volume: 0% Epithelialization: Large (67-100%) Tunneling: No Undermining: No Wound Description Classification: Partial Thickness Wound Margin: Flat and Intact Exudate Amount: Large Exudate Type: Sanguinous Exudate Color: red Foul Odor After Cleansing: No Wound Bed Granulation Amount: Medium (34-66%) Exposed Structure Granulation Quality: Red, Pink Fascia Exposed: No Necrotic Amount: Medium (34-66%) Fat Layer Exposed: No Necrotic Quality: Adherent Slough Tendon Exposed: No Muscle Exposed: No KALONI, BADOLATO. ([id number]) Joint Exposed: No Bone Exposed: No Limited to Skin Breakdown Periwound Skin Texture Texture Color No Abnormalities Noted: No No Abnormalities Noted: No Callus: No Atrophie Blanche: No Crepitus: No Cyanosis: No Excoriation: No Ecchymosis: No Fluctuance: No Erythema: No Friable: No Hemosiderin Staining: No Induration: No Mottled: No Localized Edema: No Pallor: No Rash: No Rubor: No Scarring: No Temperature /  Pain Moisture Temperature: No Abnormality No Abnormalities Noted: No Dry / Scaly: No Maceration: No Moist: No Wound Preparation Ulcer Cleansing: Rinsed/Irrigated with Saline Topical Anesthetic Applied: None Treatment Notes Wound #1 (Right Upper Arm) 1. Cleansed with: Clean wound with Normal Saline 4. Dressing Applied: Aquacel Ag 5. Secondary Dressing Applied ABD Pad Notes netting Electronic Signature(s) Signed: 12/04/2015 4:35:40 PM By: Montey Hora Entered By: Montey Hora on 12/04/2015 09:30:17 Dawn Wiley ([id number]) -------------------------------------------------------------------------------- Wound Assessment Details Patient Name: Dawn Wiley Date of Service: 12/04/2015 8:45 AM Medical Record Number: [id number] Patient Account Number: [id number] Date of Birth/Sex: March 24, 1935 (81 y.o. Female) Treating RN: Montey Hora Primary Care Physician: Deborra Medina Other Clinician: Referring Physician: Nolon Stalls Treating Physician/Extender: Frann Rider in Treatment: 0 Wound Status Wound Number: 2 Primary Malignant Wound Etiology: Wound Location: Chest - Midline Wound Open Wounding Event: Other Lesion Status: Date Acquired: 10/29/2015 Comorbid Anemia, Hypertension, Received Weeks Of Treatment: 0 History: Chemotherapy, Received Radiation Clustered Wound: No Photos Wound Measurements Length: (cm) 9.5 Width: (cm) 12 Depth: (cm) 0.2 Area: (cm) 89.535 Volume: (cm) 17.907 % Reduction in Area: 0% % Reduction in Volume: 0% Epithelialization: Large (67-100%) Tunneling: No Undermining: No Wound Description Classification: Partial Thickness Wound Margin: Flat and Intact Exudate Amount: Medium Exudate Type: Sanguinous Exudate Color: red Foul Odor After Cleansing: No Wound Bed Granulation Amount: Medium (34-66%) Exposed Structure Granulation Quality: Red, Pink Fascia Exposed: No Necrotic Amount: Medium (34-66%) Fat Layer Exposed:  No Necrotic Quality: Adherent Slough Tendon Exposed: No Muscle Exposed: No Wollard, Raelan J. ([id number]) Joint Exposed: No Bone Exposed: No Limited to Skin Breakdown Periwound Skin Texture Texture Color No Abnormalities Noted: No No Abnormalities Noted: No Callus: No Atrophie Blanche: No Crepitus: No Cyanosis: No Excoriation: No Ecchymosis: No Fluctuance: No Erythema: No Friable: No Hemosiderin Staining: No Induration: No Mottled: No Localized Edema: No Pallor: No Rash: No Rubor: No  Scarring: No Moisture No Abnormalities Noted: No Dry / Scaly: No Maceration: No Moist: No Wound Preparation Ulcer Cleansing: Rinsed/Irrigated with Saline Topical Anesthetic Applied: None Treatment Notes Wound #2 (Midline Chest) 1. Cleansed with: Clean wound with Normal Saline 4. Dressing Applied: Aquacel Ag 5. Secondary Dressing Applied ABD Pad Notes netting Electronic Signature(s) Signed: 12/04/2015 4:35:40 PM By: Montey Hora Entered By: Montey Hora on 12/04/2015 09:30:36 Dawn Wiley ([id number]) -------------------------------------------------------------------------------- [place] Details Patient Name: Dawn Wiley Date of Service: 12/04/2015 8:45 AM Medical Record Number: [id number] Patient Account Number: [id number] Date of Birth/Sex: 03/24/1935 (81 y.o. Female) Treating RN: Montey Hora Primary Care Physician: Deborra Medina Other Clinician: Referring Physician: Nolon Stalls Treating Physician/Extender: Frann Rider in Treatment: 0 Vital Signs Time Taken: 08:58 Temperature (F): 98.0 Height (in): 60 Pulse (bpm): 82 Source: Stated Respiratory Rate (breaths/min): 18 Weight (lbs): 108 Blood Pressure (mmHg): 127/50 Source: Stated Reference Range: 80 - 120 mg / dl Body Mass Index (BMI): 21.1 Electronic Signature(s) Signed: 12/04/2015 4:35:40 PM By: Montey Hora Entered By: Montey Hora on 12/04/2015 09:02:10

## 2015-12-04 NOTE — Progress Notes (Signed)
Surgical Consultation  12/04/2015  Dawn Wiley is an 81 y.o. female.   Chief Complaint  Patient presents with  . 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 Received adjuvant chemotherapy and was in a clinical trial. Unfortunately she now has metastatic disease to her chest wall mainly on her right side but there is also involvement on the inferior left chest wall skin including the breasts of the skin as well. And she did have poor on the left IJ placed by Dr. Ruthe Mannan but he was removed since it was not functional. Her disease has progressed and she is Pretty much right-sided complete chest wall involvement with some extension to the left breast. She will continue chemotherapy and is in need for Port-A-Cath. She has significant comorbidities including anemia, fragility, diabetes. She has significant independence and is able to walk and care after herself and is able to do all her ADLs independently      Past Medical History:  Diagnosis Date  . Anemia   . Arthritis   . Breast cancer ([place])   . Cancer [facility])    breast (right)  . Coronary artery disease    Coronary calcifications noted on CT scan  . Diabetes mellitus without complication ([place])   . Dysrhythmia    TACHYCARDIA-METOPROLOL CONTROLS THIS WELL  . Headache    H/O MIGRAINES  . Hyperlipidemia   . Hypertension   . Kidney stones   . Nephrolithiasis   . PONV (postoperative nausea and vomiting)   . Rash    back  . Tachycardia    Dr. Fletcher Anon, cardiologist    Past Surgical History:  Procedure Laterality Date  . BREAST SURGERY     breast biopsy X 3  . CATARACT EXTRACTION     bilateral  . EYE SURGERY     bilateral cataract extraction  . MASTECTOMY MODIFIED RADICAL Right 08/31/2014   Procedure: MASTECTOMY MODIFIED RADICAL;  Surgeon: Molly Maduro, MD;  Location: [facility] ORS;  Service: General;  Laterality: Right;  . PORT-A-CATH REMOVAL N/A 10/08/2015   Procedure: REMOVAL PORT-A-CATH;  Surgeon: Clayburn Pert, MD;  Location: [facility] ORS;  Service: General;  Laterality: N/A;  . PORTACATH PLACEMENT Left 10/20/2014   Procedure: INSERTION PORT-A-CATH;  Surgeon: Marlyce Huge, MD;  Location: [facility] ORS;  Service: General;  Laterality: Left;  . TEMPOROMANDIBULAR JOINT SURGERY      Family History  Problem Relation Age of Onset  . Peripheral Artery Disease Sister     carotid artery stenosis   . Heart disease Sister   . Diabetes Sister     Social History:  reports that she has never smoked. She has never used smokeless tobacco. She reports that she does not drink alcohol or use drugs.  Allergies: No Known Allergies  Medications reviewed.     ROS A 10 point review of system was performed and is otherwise negative other than what is stated on the history of present illness    BP (!) 146/69 (BP Location: Left Arm, Patient Position: Sitting, Cuff Size: Normal)   Pulse 85   Temp 97.8 F (36.6 C) (Oral)   Ht 5' (1.524 m)   Wt 48.5 kg (107 lb)   BMI 20.90 kg/m   Physical Exam Fragile and debilitated elderly female in no acute distress Neck: no JVD, no LAD.  No masses Breast: Skin involvement from Ca on the right and the left side. THe right side there is chest wall involvement laterally and almost circumferentially. Lungs: CTA , no wheezes CV: NSR, s1,s2 AQbd: soft, NT, no peritonitis Ext: no edema , well perfused and warm Neuro: GCS 15, awake alert, no motor or sensory deficits   No results found for this or any previous visit (from the past 48 hour(s)). No results found.  Assessment/Plan: 1. 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 This poses a challenge given that her chest wall in the right side is involved with cancer and there  is a significant involvement of her left breast from inflammatory cancer. This will give [place] the option of placing a left IJ if in fact the jugular vein is patent and given the fact that she had a previous malfunctioning of the catheter we will order ultrasound of bilateral jugulars to evaluate for vein patency. If the left side is stenotic or there is thrombosis we may have to do a right IJ poor with the actual poor warning to the posterior triangle. Although this is not ideal this is one of the limited options. Lastly if these approaches fail we may be faced with placing a port peripheral Lane surgery into her left arm. She does not have enough and fat pad of the left forearm and therefore I think that her neck is the best choice for now. I will also order coags and we will place her in the OR schedule inj 10 days. Extensive counseling provided. Scars with the patient and the daughter about the surgery, the risk, benefits, possible complications and alternatives. They wish to proceed. I also specifically discussed the risk of vascular injury, pneumothorax, further intervention, infection. Scars with her about the option of not doing any further chemotherapy and at this point she is adamant that she wishes to have more time with her family. Time was spent in this encounter over 45 minutes with greater than 50% spent in counseling and coordination of care   - US Venous Img Upper Uni Right; Future - US Venous Img Upper Uni Left; Future - APTT; Future   Caroleen Hamman MD [place] Surgeon

## 2015-12-05 ENCOUNTER — Ambulatory Visit: Payer: PPO | Admitting: Internal Medicine

## 2015-12-05 ENCOUNTER — Other Ambulatory Visit: Payer: Self-pay | Admitting: Surgery

## 2015-12-05 ENCOUNTER — Ambulatory Visit
Admission: RE | Admit: 2015-12-05 | Discharge: 2015-12-05 | Disposition: A | Discharge status: Home / Self Care | Payer: PPO | Source: Ambulatory Visit | Attending: Surgery | Admitting: Surgery

## 2015-12-05 ENCOUNTER — Ambulatory Visit
Admission: RE | Admit: 2015-12-05 | Discharge: 2015-12-05 | Disposition: A | Discharge status: Home / Self Care | Payer: PPO | Source: Ambulatory Visit | Attending: Hematology and Oncology | Admitting: Hematology and Oncology

## 2015-12-05 ENCOUNTER — Ambulatory Visit: Payer: PPO | Admitting: Surgery

## 2015-12-05 DIAGNOSIS — C50919 Malignant neoplasm of unspecified site of unspecified female breast: Secondary | ICD-10-CM

## 2015-12-05 DIAGNOSIS — C50911 Malignant neoplasm of unspecified site of right female breast: Secondary | ICD-10-CM

## 2015-12-05 NOTE — Progress Notes (Signed)
*  PRELIMINARY RESULTS* Echocardiogram 2D Echocardiogram has been performed.  Sherrie Sport 12/05/2015, 10:32 AM

## 2015-12-06 ENCOUNTER — Telehealth: Payer: Self-pay | Admitting: Surgery

## 2015-12-06 ENCOUNTER — Telehealth: Payer: Self-pay

## 2015-12-06 ENCOUNTER — Other Ambulatory Visit: Payer: Self-pay

## 2015-12-06 NOTE — Telephone Encounter (Signed)
Spoke with patient at this time and discussed US venous results. Negative for DVT. She asked if he intended to place port in the left side as what was discussed at her office visit and was told yes. Patient verbalized understanding.

## 2015-12-06 NOTE — Telephone Encounter (Signed)
Pt advised of pre op date/time and sx date. Sx: 12/17/15 with Dr Jeremy Johann placement.  Pre op: 12/12/15 between 9-1:00pm--phone.   Patient made aware to call [phone], between 1-3:00pm the day before surgery, to find out what time to arrive.

## 2015-12-06 NOTE — Telephone Encounter (Signed)
LVM for patient to call office in regards to US Venous studies.  Negative for DVT.  The plan is to begin with the Left side for port placement as was discussed while in office with Dr.Pabon.

## 2015-12-06 NOTE — Progress Notes (Signed)
[facility] day:  12/07/15   Chief Complaint: Dawn Wiley is an 81 y.o. female with stage IV right breast cancer who is seen for monthly assessment prior to week #2 of Herceptin and Navelbine.  HPI: The patient was last seen in the medical oncology clinic on 11/30/2015.  At that time, she received week #1 Herceptin and Navelbine.  She also received her monthly B12.  Symptomatically, the chest wall lesions were uncomfortable.  Some of these lesions had broken open.  Echo on 12/05/2015 revealed an EF of 55-60%.  She is scheduled for port-a-cath placement on 12/17/2015.  Symptomatically, the chest wall lesions are little bit uncomfortable. Some areas burn and itch it. Some areas are bleeding. She feels that the gauze dressing is better.   Past Medical History:  Diagnosis Date  . Anemia   . Arthritis   . Breast cancer ([place])   . Cancer [facility])    breast (right)  . Coronary artery disease    Coronary calcifications noted on CT scan  . Diabetes mellitus without complication ([place])   . Dysrhythmia    TACHYCARDIA-METOPROLOL CONTROLS THIS WELL  . Headache    H/O MIGRAINES  . Hyperlipidemia   . Hypertension   . Kidney stones   . Nephrolithiasis   . PONV (postoperative nausea and vomiting)   . Rash    back  . Tachycardia    Dr. Fletcher Anon, cardiologist    Past Surgical History:  Procedure Laterality Date  . BREAST SURGERY     breast biopsy X 3  . CATARACT EXTRACTION     bilateral  . EYE SURGERY     bilateral cataract extraction  . MASTECTOMY MODIFIED RADICAL Right 08/31/2014   Procedure: MASTECTOMY MODIFIED RADICAL;  Surgeon: Molly Maduro, MD;  Location: [facility] ORS;  Service: General;  Laterality: Right;  . PORT-A-CATH REMOVAL N/A 10/08/2015   Procedure: REMOVAL PORT-A-CATH;  Surgeon: Clayburn Pert, MD;  Location: [facility] ORS;  Service: General;  Laterality: N/A;  . PORTACATH PLACEMENT Left 10/20/2014   Procedure: INSERTION PORT-A-CATH;   Surgeon: Marlyce Huge, MD;  Location: [facility] ORS;  Service: General;  Laterality: Left;  . TEMPOROMANDIBULAR JOINT SURGERY      Family History  Problem Relation Age of Onset  . Peripheral Artery Disease Sister     carotid artery stenosis   . Heart disease Sister   . Diabetes Sister     Social History:  reports that she has never smoked. She has never used smokeless tobacco. She reports that she does not drink alcohol or use drugs.  She and her family are going to the beach 12/09/2015 - 12/14/2015.  The patient is accompanied by her daughter today.  Her daughter's phone number is [phone].    Allergies: No Known Allergies  Current Medications: Current Outpatient Prescriptions  Medication Sig Dispense Refill  . acetaminophen (TYLENOL) 500 MG tablet Take 500 mg by mouth every 6 (six) hours as needed for mild pain or moderate pain.     [name] Kitchen amLODipine (NORVASC) 5 MG tablet Take 1 tablet (5 mg total) by mouth daily. (Patient taking differently: Take 5 mg by mouth every morning. ) 90 tablet 1  . aspirin 81 MG tablet Take 81 mg by mouth every other day.    . Calcium Carbonate (CALTRATE 600 PO) Take 1,200 mg by mouth daily.     . cholecalciferol (VITAMIN D) 400 UNITS TABS tablet Take 800 Units by mouth.    . Cyanocobalamin (  VITAMIN B 12 PO) Take 1 tablet by mouth every morning.    . Cyanocobalamin (VITAMIN B-12 IJ) Inject as directed every 30 (thirty) days.     . diphenhydrAMINE (BENADRYL) 25 mg capsule Take 25 mg by mouth every 6 (six) hours as needed for allergies.    . ferrous sulfate 325 (65 FE) MG tablet Take 325 mg by mouth daily with breakfast.     . HYDROcodone-acetaminophen (NORCO) 7.5-325 MG tablet Take 1 tablet by mouth every 6 (six) hours as needed for moderate pain. 60 tablet 0  . metoprolol tartrate (LOPRESSOR) 25 MG tablet Take 1 tablet (25 mg total) by mouth 2 (two) times daily. 180 tablet 2  . potassium chloride (K-DUR,KLOR-CON) 10 MEQ tablet Take 1 tablet (10 mEq  total) by mouth daily. 30 tablet 2  . temazepam (RESTORIL) 15 MG capsule 1 tablet at night as needed for sleep 30 capsule 1   No current facility-administered medications for this visit.    Facility-Administered Medications Ordered in Other Visits  Medication Dose Route Frequency Provider Last Rate Last Dose  . sodium chloride 0.9 % injection 10 mL  10 mL Intravenous PRN [name], MD      . sodium chloride 0.9 % injection 10 mL  10 mL Intravenous PRN [name], MD   10 mL at 03/21/15 0849  . sodium chloride 0.9 % injection 10 mL  10 mL Intracatheter PRN [name], MD   10 mL at 04/25/15 0834  . sodium chloride flush (NS) 0.9 % injection 10 mL  10 mL Intravenous PRN [name], MD   10 mL at 09/18/15 1423    Review of Systems:  GENERAL: Feels "ok". No fevers or sweats.Weight stable. PERFORMANCE STATUS (ECOG): 1 HEENT: No visual changes, runny nose, sore throat, or tenderness. Lungs: No shortness of breath or cough. No hemoptysis. Cardiac: No chest pain, palpitations, orthopnea, or PND. GI: Loose stool.  No nausea, vomiting, constipation, melena or hematochezia. GU: No urgency, frequency, dysuria, or hematuria. Musculoskeletal: No back pain. No joint pain. No muscle tenderness. Extremities: No pain or swelling. Skin: Chest wall lesions growing (uncomfortable).  Some lesions burn and itch.  Some bleeding areas. Neuro: No headache, numbness or weakness, balance or coordination issues. Endocrine: Diabetes.  No thyroid issues, hot flashes or night sweats. Psych: No mood changes.  Denies depression or anxiety.  Trouble sleeping. Pain: Chest wall lesions uncomfortable (see HPI). Review of systems: All other systems reviewed and found to be negative.  Physical Exam: Blood pressure 129/70, pulse 91, temperature 97.7 F (36.5 C), temperature source Tympanic, resp. rate 18, weight 108 lb 5.7 oz (49.2 kg). GENERAL: Thin fatigued appearing  elderly woman sitting comfortably in the exam room in no acute distress. MENTAL STATUS: Alert and oriented to person, place and time. HEAD: Short gray hair. Normocephalic, atraumatic, face symmetric, no Cushingoid features. EYES: Blue eyes. Pupils equal round and reactive to light and accomodation.  No conjunctivitis or scleral icterus. RESPIRATORY:  Clear to auscultation without rales, wheezes or rhonchi. CARDIOVASCULAR:  Regular rate and rhythm without murmur, rub or gallop. BREAST: Right mastectomy. Enlarging right chest wall lesions measuring 6 x 9 cm, 5 x 3 cm, 3 x 3 cm, 2.5 x 2.5 cm.  Nodular area medially involving some of left breast.  Enlarging right lateral back mass measuring 15 x 16 cm.  Extent of disease 40 cm front to back. ABDOMEN:  Soft, non-tender, with active bowel sounds, and no hepatosplenomegaly.  No masses. SKIN: Chest wall lesions as above.  Upper right arm 8.5 x 6 cm nodular lesion (dry). EXTREMITIES: No edema, no skin discoloration or tenderness. No palpable cords. LYMPH NODES: No palpable axillary adenopathy.  Small right supraclavicular node. NEUROLOGICAL: Unremarkable. PSYCH: Appropriate.   Clinical Support on 12/07/2015  Component Date Value Ref Range Status  . WBC 12/07/2015 6.0  3.6 - 11.0 K/uL Final  . RBC 12/07/2015 3.16* 3.80 - 5.20 MIL/uL Final  . Hemoglobin 12/07/2015 8.8* 12.0 - 16.0 g/dL Final  . HCT 12/07/2015 27.0* 35.0 - 47.0 % Final  . MCV 12/07/2015 85.5  80.0 - 100.0 fL Final  . MCH 12/07/2015 27.8  26.0 - 34.0 pg Final  . MCHC 12/07/2015 32.4  32.0 - 36.0 g/dL Final  . RDW 12/07/2015 17.0* 11.5 - 14.5 % Final  . Platelets 12/07/2015 234  150 - 440 K/uL Final  . Neutrophils Relative % 12/07/2015 77  % Final  . Neutro Abs 12/07/2015 4.6  1.4 - 6.5 K/uL Final  . Lymphocytes Relative 12/07/2015 14  % Final  . Lymphs Abs 12/07/2015 0.8* 1.0 - 3.6 K/uL Final  . Monocytes Relative 12/07/2015 5  % Final  . Monocytes Absolute 12/07/2015 0.3   0.2 - 0.9 K/uL Final  . Eosinophils Relative 12/07/2015 3  % Final  . Eosinophils Absolute 12/07/2015 0.2  0 - 0.7 K/uL Final  . Basophils Relative 12/07/2015 1  % Final  . Basophils Absolute 12/07/2015 0.1  0 - 0.1 K/uL Final  . Sodium 12/07/2015 136  135 - 145 mmol/L Final  . Potassium 12/07/2015 3.6  3.5 - 5.1 mmol/L Final  . Chloride 12/07/2015 104  101 - 111 mmol/L Final  . CO2 12/07/2015 26  22 - 32 mmol/L Final  . Glucose, Bld 12/07/2015 180* 65 - 99 mg/dL Final  . BUN 12/07/2015 12  6 - 20 mg/dL Final  . Creatinine, Ser 12/07/2015 0.48  0.44 - 1.00 mg/dL Final  . Calcium 12/07/2015 9.0  8.9 - 10.3 mg/dL Final  . Total Protein 12/07/2015 6.8  6.5 - 8.1 g/dL Final  . Albumin 12/07/2015 3.9  3.5 - 5.0 g/dL Final  . AST 12/07/2015 24  15 - 41 U/L Final  . ALT 12/07/2015 12* 14 - 54 U/L Final  . Alkaline Phosphatase 12/07/2015 72  38 - 126 U/L Final  . Total Bilirubin 12/07/2015 0.4  0.3 - 1.2 mg/dL Final  . GFR calc non Af Amer 12/07/2015 >60  >60 mL/min Final  . GFR calc Af Amer 12/07/2015 >60  >60 mL/min Final   Comment: (NOTE) The eGFR has been calculated using the CKD EPI equation. This calculation has not been validated in all clinical situations. eGFR's persistently <60 mL/min signify possible Chronic Kidney Disease.   . Anion gap 12/07/2015 6  5 - 15 Final  . Magnesium 12/07/2015 1.9  1.7 - 2.4 mg/dL Final  . aPTT 12/07/2015 26  24 - 36 seconds Final    Assessment:  Dawn Wiley is an 81 y.o. female with metastatic breast cancer.  She presented with stage IIIC right breast cancer status post mastectomy and axillary lymph node dissection on 08/31/2014. 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 Carcinoma involved skeletal muscle and dermal lymphatics. Tumor was less than 0.5 mm from the deep margin. Thirteen of 15 lymph nodes were involved. The largest metastatic focus was 2 cm. Tumor is triple negative (ER  negative, PR negative, and HER-2/neu negative).  Androgen receptor and PDL-1 testing was negative.  Pathologic stage was T3N3aMx.  PET scan on 09/21/2014 revealed postoperative changes in the right chest with no suspicious findings or residual tumor. Bone scan on 10/11/2014 revealed no evidence of metastatic disease.  Bone density study on 10/02/2014 revealed a T score of -4.7 in the left forearm consistent with osteoporosis. T-score was-3.6 in the right femur.   Echo on 10/02/2014 revealed ejection fraction of 60-65%.  Echo on 06/27/2015 revealed EF of 55-60%.  Echo on 08/31/2015 revealed EF of 55%.  Echo on 12/05/2015 revealed an EF of 55-60%.  She has iron deficiency anemia. Labs on 09/19/2014 revealed a hematocrit of 29.9, hemoglobin 8.7, MCV 69.7, ferritin 9, and TIBC 616. B12 was low (265) with a prior history of B12 deficiency and need for supplementation (stopped 06/2014). She began B12 on 10/12/2014 (last 11/30/2015).  Folate was normal.  Her diet is modest. She has never had a colonoscopy. She denies any melena or hematochezia.  She takes 1 iron pill a day (additional iron causes diarrhea).  Ferritin was 116 on 04/25/2015.  Chest wall biopsy x 3 (lateral, medial, and middle sites) on 11/08/2014 revealed dermal lymphatic involvement by invasive mammary carcinoma with micropapillary features.  Right arm punch biopsy on 06/28/2015 confirmed breast cancer.  She has aggressive disease with growth despite several chemotherapeutic agents.  She received 5 weeks of Taxol (11/03/2014 - 12/01/2014).  She received 1 week of adriamycin (12/14/2014).  She received 1 cycle of carboplatin (12/28/2014).  After carboplatin, disease appeared to stabilize, but with 2 weeks off of therapy, her disease grew again (medial lesion larger and weaping).   She received 3 cycles of carboplatin and Taxol (01/18/2015 - 03/02/2015).  Chest wall lesions initially decreased then began to grow.  She received 2 cycles of  Adriamycin and Cytoxan (03/26/2015 - 04/11/2015) with Neulasta support.  ANC nadir was 200 on day 9 (04/03/2015).  Cycle #2 was complicated by mouth sores, decreased oral intake, and weight loss. Chest wall disease began to grow before cycle #3 could be administered.  PET scan on 04/26/2015 revealed extensive recurrence about the right chest.  The far lateral component measured 4.2 x 2.2 cm (S.U.V.13.5).  A portion superficial to the right-side of the sternum measured 5.2 x 2.2 cm (S.U.V. 9.1).  There was diffuse more inferior and lateral chest wall soft tissue thickening and hypermetabolism.    CA27.29 was 118.4 on 05/02/2015, 92.9 on 06/11/2015, 38.5 on 07/03/2015, 27.4 on 07/24/2015, and 169.6 on 12/07/2015.  She received 5 weeks of concurrent carboplatin and radiation (05/14/2015 - 06/11/2015).  Radiation completed on 06/15/2015.  Cutaneous lesions in the field of radiation improved dramatically.    [facility] testing on 05/22/2015 returned genomic alterations of ERBB2 (S310F and V777L), FGFR1 (amplification), CDH1 (E490*), CDKN2A (p16INK4a H83Y and p14ARF A97V), MYST3 (amplification), TERT (promotor -124C>T), TP53  (Y220C), and ZNF703 (amplification).  FDA approved therapies included ado-trastuzumab, lapatinib, pertuzumab, trastuzumab for ERBB2 as well as afatinib (approved in other tumor type) and pazopanib and ponatinib (approved in other tumor types for FGFR1).  She received 8 weeks of Herceptin (07/03/2015 - 08/21/2015).  Lesions continued to progress.  PET scan on 08/31/2015 revealed the interval development of multifocal hypermetabolic skin thickening and masses, representing new cutaneous metastases. Interval resolution of superior right anterior chest wall subcutaneous lesion.  There was interval development of multiple hypermetabolic left axillary lymph nodes compatible with metastasis. There was interval resolution of right axillary hypermetabolic lymph nodes. Bone scan on  08/31/2015  revealed no evidence of metastatic disease.    She is scheduled for port placement on 12/17/2015.  She received neratinib and Faslodex (began 09/07/2015) on a [facility] clinical trial.  She initially had a dramatic response.  She then developed rapid growth.  Weekly Herceptin was added on 10/26/2015 (last 11/01/2015).   Chest, abdomen, and pelvic CT [facility] 11/01/2015 revealed interval increase in nodular skin thickening overlying the left anterior chest/upper abdomen. There was decreased left axillary adenopathy. There was interval development of anterior right middle and right upper lobe groundglass and consolidative opacities. There was circumferential wall thickening of the ascending colon. There was a new subcentimeter liver lesion too small to characterize.  Bone scan on 11/01/2015 at [facility] revealed no evidence of metastatic disease.     She is s/p week #1 Herceptin and Navelbine (11/30/2015).  She has a normocytic anemia likely due to ongoing treatment and chronic disease.    Symptomatically, her chest wall lesions continue grow.  Lesions are uncomfortable and at times are pruritic and bleed.   Plan: 1.  Labs today:  CBC with diff, CMP, Mg, CA27.29, ferritin, iron studies, folate. 2.  Week #2 Herceptin and Navelbine today. 3.  Herceptin and Navelbine today 4.  Port-a-cath placement on 12/17/2015. 5.  Discuss travel plans.  Discuss Herceptin +/- Navelbine on 12/14/2015.  Given patient's return on 08/21, decision made for Herceptin alone. 6.  RTC in 1 week for Herceptin 7.  RTC in 2 weeks for MD assess, medical photos, labs (CBC with diff, CMP, CA27.29), Herceptin + Navelbine.   [name], MD  12/07/2015, 10:13 AM

## 2015-12-07 ENCOUNTER — Other Ambulatory Visit: Payer: Self-pay | Admitting: Hematology and Oncology

## 2015-12-07 ENCOUNTER — Telehealth: Payer: Self-pay | Admitting: *Deleted

## 2015-12-07 ENCOUNTER — Encounter: Payer: Self-pay | Admitting: Hematology and Oncology

## 2015-12-07 ENCOUNTER — Inpatient Hospital Stay: Payer: PPO | Admitting: *Deleted

## 2015-12-07 ENCOUNTER — Inpatient Hospital Stay: Payer: PPO

## 2015-12-07 ENCOUNTER — Other Ambulatory Visit: Payer: Self-pay | Admitting: *Deleted

## 2015-12-07 ENCOUNTER — Inpatient Hospital Stay (HOSPITAL_BASED_OUTPATIENT_CLINIC_OR_DEPARTMENT_OTHER): Payer: PPO | Admitting: Hematology and Oncology

## 2015-12-07 VITALS — BP 129/70 | HR 91 | Temp 97.7°F | Resp 18 | Wt 108.4 lb

## 2015-12-07 DIAGNOSIS — Z87442 Personal history of urinary calculi: Secondary | ICD-10-CM

## 2015-12-07 DIAGNOSIS — R21 Rash and other nonspecific skin eruption: Secondary | ICD-10-CM

## 2015-12-07 DIAGNOSIS — C50912 Malignant neoplasm of unspecified site of left female breast: Secondary | ICD-10-CM

## 2015-12-07 DIAGNOSIS — C779 Secondary and unspecified malignant neoplasm of lymph node, unspecified: Secondary | ICD-10-CM | POA: Diagnosis not present

## 2015-12-07 DIAGNOSIS — Z7984 Long term (current) use of oral hypoglycemic drugs: Secondary | ICD-10-CM

## 2015-12-07 DIAGNOSIS — D649 Anemia, unspecified: Secondary | ICD-10-CM

## 2015-12-07 DIAGNOSIS — Z171 Estrogen receptor negative status [ER-]: Secondary | ICD-10-CM

## 2015-12-07 DIAGNOSIS — Z79899 Other long term (current) drug therapy: Secondary | ICD-10-CM

## 2015-12-07 DIAGNOSIS — R918 Other nonspecific abnormal finding of lung field: Secondary | ICD-10-CM

## 2015-12-07 DIAGNOSIS — IMO0001 Reserved for inherently not codable concepts without codable children: Secondary | ICD-10-CM

## 2015-12-07 DIAGNOSIS — C792 Secondary malignant neoplasm of skin: Secondary | ICD-10-CM | POA: Diagnosis not present

## 2015-12-07 DIAGNOSIS — C50911 Malignant neoplasm of unspecified site of right female breast: Secondary | ICD-10-CM

## 2015-12-07 DIAGNOSIS — I10 Essential (primary) hypertension: Secondary | ICD-10-CM

## 2015-12-07 DIAGNOSIS — E119 Type 2 diabetes mellitus without complications: Secondary | ICD-10-CM

## 2015-12-07 DIAGNOSIS — L988 Other specified disorders of the skin and subcutaneous tissue: Secondary | ICD-10-CM

## 2015-12-07 DIAGNOSIS — I2510 Atherosclerotic heart disease of native coronary artery without angina pectoris: Secondary | ICD-10-CM

## 2015-12-07 DIAGNOSIS — E538 Deficiency of other specified B group vitamins: Secondary | ICD-10-CM

## 2015-12-07 DIAGNOSIS — M129 Arthropathy, unspecified: Secondary | ICD-10-CM

## 2015-12-07 DIAGNOSIS — Z8669 Personal history of other diseases of the nervous system and sense organs: Secondary | ICD-10-CM

## 2015-12-07 DIAGNOSIS — R000 Tachycardia, unspecified: Secondary | ICD-10-CM

## 2015-12-07 DIAGNOSIS — Z5112 Encounter for antineoplastic immunotherapy: Secondary | ICD-10-CM | POA: Diagnosis not present

## 2015-12-07 DIAGNOSIS — E785 Hyperlipidemia, unspecified: Secondary | ICD-10-CM

## 2015-12-07 DIAGNOSIS — M818 Other osteoporosis without current pathological fracture: Secondary | ICD-10-CM

## 2015-12-07 DIAGNOSIS — D509 Iron deficiency anemia, unspecified: Secondary | ICD-10-CM

## 2015-12-07 DIAGNOSIS — Z7982 Long term (current) use of aspirin: Secondary | ICD-10-CM

## 2015-12-07 LAB — APTT: aPTT: 26 seconds (ref 24–36)

## 2015-12-07 LAB — IRON AND TIBC
Iron: 17 ug/dL — ABNORMAL LOW (ref 28–170)
Saturation Ratios: 4 % — ABNORMAL LOW (ref 10.4–31.8)
TIBC: 420 ug/dL (ref 250–450)
UIBC: 403 ug/dL

## 2015-12-07 LAB — COMPREHENSIVE METABOLIC PANEL
ALT: 12 U/L — ABNORMAL LOW (ref 14–54)
AST: 24 U/L (ref 15–41)
Albumin: 3.9 g/dL (ref 3.5–5.0)
Alkaline Phosphatase: 72 U/L (ref 38–126)
Anion gap: 6 (ref 5–15)
BUN: 12 mg/dL (ref 6–20)
CO2: 26 mmol/L (ref 22–32)
Calcium: 9 mg/dL (ref 8.9–10.3)
Chloride: 104 mmol/L (ref 101–111)
Creatinine, Ser: 0.48 mg/dL (ref 0.44–1.00)
GFR calc Af Amer: >60 mL/min (ref >60)
GFR calc non Af Amer: >60 mL/min (ref >60)
Glucose, Bld: 180 mg/dL — ABNORMAL HIGH (ref 65–99)
Potassium: 3.6 mmol/L (ref 3.5–5.1)
Sodium: 136 mmol/L (ref 135–145)
Total Bilirubin: 0.4 mg/dL (ref 0.3–1.2)
Total Protein: 6.8 g/dL (ref 6.5–8.1)

## 2015-12-07 LAB — CBC WITH DIFFERENTIAL/PLATELET
Basophils Absolute: 0.1 10*3/uL (ref 0–0.1)
Basophils Relative: 1 %
Eosinophils Absolute: 0.2 10*3/uL (ref 0–0.7)
Eosinophils Relative: 3 %
HCT: 27 % — ABNORMAL LOW (ref 35.0–47.0)
Hemoglobin: 8.8 g/dL — ABNORMAL LOW (ref 12.0–16.0)
Lymphocytes Relative: 14 %
Lymphs Abs: 0.8 10*3/uL — ABNORMAL LOW (ref 1.0–3.6)
MCH: 27.8 pg (ref 26.0–34.0)
MCHC: 32.4 g/dL (ref 32.0–36.0)
MCV: 85.5 fL (ref 80.0–100.0)
Monocytes Absolute: 0.3 10*3/uL (ref 0.2–0.9)
Monocytes Relative: 5 %
Neutro Abs: 4.6 10*3/uL (ref 1.4–6.5)
Neutrophils Relative %: 77 %
Platelets: 234 10*3/uL (ref 150–440)
RBC: 3.16 MIL/uL — ABNORMAL LOW (ref 3.80–5.20)
RDW: 17 % — ABNORMAL HIGH (ref 11.5–14.5)
WBC: 6 10*3/uL (ref 3.6–11.0)

## 2015-12-07 LAB — FOLATE: Folate: 28 ng/mL (ref >5.9)

## 2015-12-07 LAB — MAGNESIUM: Magnesium: 1.9 mg/dL (ref 1.7–2.4)

## 2015-12-07 LAB — FERRITIN: Ferritin: 37 ng/mL (ref 11–307)

## 2015-12-07 MED ORDER — SODIUM CHLORIDE 0.9 % IV SOLN
Freq: Once | INTRAVENOUS | Status: AC
  Administered 2015-12-07: 11:00:00 via INTRAVENOUS
  Filled 2015-12-07: qty 1000

## 2015-12-07 MED ORDER — TRASTUZUMAB CHEMO 150 MG IV SOLR
2.0000 mg/kg | Freq: Once | INTRAVENOUS | Status: AC
  Administered 2015-12-07: 105 mg via INTRAVENOUS
  Filled 2015-12-07: qty 5

## 2015-12-07 MED ORDER — ONDANSETRON HCL 4 MG PO TABS
8.0000 mg | ORAL_TABLET | Freq: Once | ORAL | Status: AC
  Administered 2015-12-07: 8 mg via ORAL
  Filled 2015-12-07: qty 2

## 2015-12-07 MED ORDER — ACETAMINOPHEN 325 MG PO TABS
650.0000 mg | ORAL_TABLET | Freq: Once | ORAL | Status: AC
  Administered 2015-12-07: 650 mg via ORAL
  Filled 2015-12-07: qty 2

## 2015-12-07 MED ORDER — DIPHENHYDRAMINE HCL 25 MG PO CAPS
50.0000 mg | ORAL_CAPSULE | Freq: Once | ORAL | Status: AC
  Administered 2015-12-07: 50 mg via ORAL
  Filled 2015-12-07: qty 2

## 2015-12-07 MED ORDER — VINORELBINE TARTRATE CHEMO INJECTION 50 MG/5ML
25.0000 mg/m2 | Freq: Once | INTRAVENOUS | Status: AC
  Administered 2015-12-07: 36 mg via INTRAVENOUS
  Filled 2015-12-07: qty 3.6

## 2015-12-07 NOTE — Telephone Encounter (Signed)
Pt is taking 1 iron tablet daily. Instructed to increased to taking 1 iron tablet BID. Instructed pt to let us know if has any further questions.

## 2015-12-07 NOTE — Progress Notes (Signed)
Pt had a spot from her beast cancer on the skin to bleed this am. Dressing changed with vaseline gauze,  telfa pad and burn wrap over wound.  It has stopped bleeding when dressing had been redressed.  Also the other wound from breast cancer on right chest/breast area was changed and new burn net placed to hold dressing in place- pt wanted to keep current telfa on the dressing because there was no discharge from that spot on dressing.

## 2015-12-07 NOTE — Progress Notes (Signed)
States is feeling well. Having discomfort in right chest area.

## 2015-12-08 LAB — CANCER ANTIGEN 27.29: CA 27.29: 169.6 U/mL — ABNORMAL HIGH (ref 0.0–38.6)

## 2015-12-10 ENCOUNTER — Inpatient Hospital Stay: Admission: RE | Admit: 2015-12-10 | Payer: PPO | Source: Ambulatory Visit

## 2015-12-12 ENCOUNTER — Inpatient Hospital Stay
Admission: RE | Admit: 2015-12-12 | Discharge: 2015-12-12 | Disposition: A | Discharge status: Home / Self Care | Payer: PPO | Source: Ambulatory Visit

## 2015-12-12 ENCOUNTER — Inpatient Hospital Stay: Admission: RE | Admit: 2015-12-12 | Payer: PPO | Source: Ambulatory Visit

## 2015-12-12 NOTE — Pre-Procedure Instructions (Signed)
Component Name Value Ref Range  Vent Rate (bpm) 72   PR Interval (msec) 152   QRS Interval (msec) 72   QT Interval (msec) 386   QTc (msec) 422   Result Narrative  Normal sinus rhythm Early precordial QRS transition Otherwise normal ECG  When compared with ECG of 31-Aug-2015 16:45, Nonspecific ST depression is now absent Nonspecific T wave abnormalities are now absent I reviewed and concur with this report. Electronically signed [id number], MD, Nathaneil Canary [phone]) on 11/15/2015 2:07:57 PM  Status Results Details    Office Visit on 11/15/2015 [place]")' href="[email or web address]/">Encounter Summary

## 2015-12-13 ENCOUNTER — Encounter: Payer: Self-pay | Admitting: *Deleted

## 2015-12-13 NOTE — Patient Instructions (Signed)
  Your procedure is scheduled on: 12-17-15 Report to Same Day Surgery 2nd floor medical mall To find out your arrival time please call [phone] between 1PM - 3PM on 12-14-15  Remember: Instructions that are not followed completely may result in serious medical risk, up to and including death, or upon the discretion of your surgeon and anesthesiologist your surgery may need to be rescheduled.    _x___ 1. Do not eat food or drink liquids after midnight. No gum chewing or hard candies.     __x__ 2. No Alcohol for 24 hours before or after surgery.   __x__3. No Smoking for 24 prior to surgery.   ____  4. Bring all medications with you on the day of surgery if instructed.    __x__ 5. Notify your doctor if there is any change in your medical condition     (cold, fever, infections).     Do not wear jewelry, make-up, hairpins, clips or nail polish.  Do not wear lotions, powders, or perfumes. You may wear deodorant.  Do not shave 48 hours prior to surgery. Men may shave face and neck.  Do not bring valuables to the hospital.    [facility] is not responsible for any belongings or valuables.               Contacts, dentures or bridgework may not be worn into surgery.  Leave your suitcase in the car. After surgery it may be brought to your room.  For patients admitted to the hospital, discharge time is determined by your treatment team.   Patients discharged the day of surgery will not be allowed to drive home.    Please read over the following fact sheets that you were given:   [facility] Preparing for Surgery and or MRSA Information   _x___ Take these medicines the morning of surgery with A SIP OF WATER:    1. metoprolol  2. amlodipine  3.  4.  5.  6.  ____ Fleet Enema (as directed)   ____ Use CHG Soap or sage wipes as directed on instruction sheet   ____ Use inhalers on the day of surgery and bring to hospital day of surgery  ____ Stop metformin 2 days prior to  surgery    ____ Take 1/2 of usual insulin dose the night before surgery and none on the morning of  surgery.   ____ Stop aspirin or coumadin, or plavix  _x__ Stop Anti-inflammatories such as Advil, Aleve, Ibuprofen, Motrin, Naproxen,          Naprosyn, Goodies powders or aspirin products-HYDROCODONE OK TO CONTINUE    ____ Stop supplements until after surgery.    ____ Bring C-Pap to the hospital.

## 2015-12-14 ENCOUNTER — Inpatient Hospital Stay: Payer: PPO

## 2015-12-14 ENCOUNTER — Other Ambulatory Visit: Payer: Self-pay | Admitting: Hematology and Oncology

## 2015-12-14 ENCOUNTER — Other Ambulatory Visit: Payer: Self-pay | Admitting: *Deleted

## 2015-12-14 VITALS — BP 111/69 | HR 80 | Temp 97.0°F | Resp 18

## 2015-12-14 DIAGNOSIS — C50911 Malignant neoplasm of unspecified site of right female breast: Secondary | ICD-10-CM

## 2015-12-14 DIAGNOSIS — Z5112 Encounter for antineoplastic immunotherapy: Secondary | ICD-10-CM | POA: Diagnosis not present

## 2015-12-14 MED ORDER — HYDROCODONE-ACETAMINOPHEN 7.5-325 MG PO TABS
1.0000 | ORAL_TABLET | Freq: Four times a day (QID) | ORAL | 0 refills | Status: DC | PRN
Start: 2015-12-14 — End: 2016-01-03

## 2015-12-14 MED ORDER — SODIUM CHLORIDE 0.9 % IV SOLN
Freq: Once | INTRAVENOUS | Status: AC
  Administered 2015-12-14: 15:00:00 via INTRAVENOUS
  Filled 2015-12-14: qty 1000

## 2015-12-14 MED ORDER — ACETAMINOPHEN 325 MG PO TABS
650.0000 mg | ORAL_TABLET | Freq: Once | ORAL | Status: AC
  Administered 2015-12-14: 650 mg via ORAL
  Filled 2015-12-14: qty 2

## 2015-12-14 MED ORDER — DIPHENHYDRAMINE HCL 25 MG PO CAPS
50.0000 mg | ORAL_CAPSULE | Freq: Once | ORAL | Status: AC
  Administered 2015-12-14: 50 mg via ORAL
  Filled 2015-12-14: qty 2

## 2015-12-14 MED ORDER — TRASTUZUMAB CHEMO 150 MG IV SOLR
2.0000 mg/kg | Freq: Once | INTRAVENOUS | Status: AC
  Administered 2015-12-14: 105 mg via INTRAVENOUS
  Filled 2015-12-14: qty 5

## 2015-12-17 ENCOUNTER — Ambulatory Visit: Payer: PPO | Admitting: Certified Registered Nurse Anesthetist

## 2015-12-17 ENCOUNTER — Encounter: Payer: Self-pay | Admitting: *Deleted

## 2015-12-17 ENCOUNTER — Ambulatory Visit
Admission: RE | Admit: 2015-12-17 | Discharge: 2015-12-17 | Disposition: A | Discharge status: Home / Self Care | Payer: PPO | Source: Ambulatory Visit | Attending: Surgery | Admitting: Surgery

## 2015-12-17 ENCOUNTER — Encounter
Admission: RE | Disposition: A | Discharge status: Home / Self Care | Payer: Self-pay | Source: Ambulatory Visit | Attending: Surgery

## 2015-12-17 ENCOUNTER — Ambulatory Visit: Payer: PPO

## 2015-12-17 DIAGNOSIS — I10 Essential (primary) hypertension: Secondary | ICD-10-CM | POA: Diagnosis not present

## 2015-12-17 DIAGNOSIS — Z9842 Cataract extraction status, left eye: Secondary | ICD-10-CM | POA: Diagnosis not present

## 2015-12-17 DIAGNOSIS — C50911 Malignant neoplasm of unspecified site of right female breast: Secondary | ICD-10-CM

## 2015-12-17 DIAGNOSIS — Z87442 Personal history of urinary calculi: Secondary | ICD-10-CM | POA: Insufficient documentation

## 2015-12-17 DIAGNOSIS — I499 Cardiac arrhythmia, unspecified: Secondary | ICD-10-CM | POA: Insufficient documentation

## 2015-12-17 DIAGNOSIS — Z833 Family history of diabetes mellitus: Secondary | ICD-10-CM | POA: Diagnosis not present

## 2015-12-17 DIAGNOSIS — D649 Anemia, unspecified: Secondary | ICD-10-CM | POA: Insufficient documentation

## 2015-12-17 DIAGNOSIS — Z8249 Family history of ischemic heart disease and other diseases of the circulatory system: Secondary | ICD-10-CM | POA: Insufficient documentation

## 2015-12-17 DIAGNOSIS — M1990 Unspecified osteoarthritis, unspecified site: Secondary | ICD-10-CM | POA: Insufficient documentation

## 2015-12-17 DIAGNOSIS — Z9841 Cataract extraction status, right eye: Secondary | ICD-10-CM | POA: Insufficient documentation

## 2015-12-17 DIAGNOSIS — I2510 Atherosclerotic heart disease of native coronary artery without angina pectoris: Secondary | ICD-10-CM | POA: Insufficient documentation

## 2015-12-17 DIAGNOSIS — E119 Type 2 diabetes mellitus without complications: Secondary | ICD-10-CM | POA: Diagnosis not present

## 2015-12-17 DIAGNOSIS — C7989 Secondary malignant neoplasm of other specified sites: Secondary | ICD-10-CM | POA: Insufficient documentation

## 2015-12-17 DIAGNOSIS — E785 Hyperlipidemia, unspecified: Secondary | ICD-10-CM | POA: Diagnosis not present

## 2015-12-17 HISTORY — PX: PORTACATH PLACEMENT: SHX2246

## 2015-12-17 SURGERY — INSERTION, TUNNELED CENTRAL VENOUS DEVICE, WITH PORT
Anesthesia: General | Laterality: Left | Wound class: Clean

## 2015-12-17 MED ORDER — CEFAZOLIN SODIUM-DEXTROSE 2-4 GM/100ML-% IV SOLN
INTRAVENOUS | Status: AC
  Filled 2015-12-17: qty 100

## 2015-12-17 MED ORDER — LIDOCAINE HCL (PF) 1 % IJ SOLN
INTRAMUSCULAR | Status: AC
Start: 2015-12-17 — End: 2015-12-17
  Filled 2015-12-17: qty 30

## 2015-12-17 MED ORDER — CHLORHEXIDINE GLUCONATE CLOTH 2 % EX PADS: 6.0000 | MEDICATED_PAD | Freq: Once | CUTANEOUS | Status: DC

## 2015-12-17 MED ORDER — ACETAMINOPHEN 10 MG/ML IV SOLN
INTRAVENOUS | Status: AC
  Filled 2015-12-17: qty 100

## 2015-12-17 MED ORDER — OXYCODONE HCL 5 MG/5ML PO SOLN
5.0000 mg | Freq: Once | ORAL | Status: DC | PRN
Start: 2015-12-17 — End: 2015-12-17

## 2015-12-17 MED ORDER — FENTANYL CITRATE (PF) 100 MCG/2ML IJ SOLN
INTRAMUSCULAR | Status: DC | PRN
  Administered 2015-12-17 (×2): 25 ug via INTRAVENOUS

## 2015-12-17 MED ORDER — LIDOCAINE HCL (PF) 1 % IJ SOLN
INTRAMUSCULAR | Status: DC | PRN
  Administered 2015-12-17: 5 mL

## 2015-12-17 MED ORDER — FENTANYL CITRATE (PF) 100 MCG/2ML IJ SOLN: 25.0000 ug | INTRAMUSCULAR | Status: DC | PRN

## 2015-12-17 MED ORDER — FAMOTIDINE 20 MG PO TABS
20.0000 mg | ORAL_TABLET | Freq: Once | ORAL | Status: AC
  Administered 2015-12-17: 20 mg via ORAL

## 2015-12-17 MED ORDER — OXYCODONE HCL 5 MG PO TABS: 5.0000 mg | ORAL_TABLET | Freq: Once | ORAL | Status: DC | PRN

## 2015-12-17 MED ORDER — ONDANSETRON HCL 4 MG/2ML IJ SOLN
INTRAMUSCULAR | Status: DC | PRN
  Administered 2015-12-17: 4 mg via INTRAVENOUS

## 2015-12-17 MED ORDER — PROPOFOL 500 MG/50ML IV EMUL
INTRAVENOUS | Status: DC | PRN
  Administered 2015-12-17: 50 ug/kg/min via INTRAVENOUS

## 2015-12-17 MED ORDER — SODIUM CHLORIDE 0.9 % IV SOLN
INTRAVENOUS | Status: DC | PRN
  Administered 2015-12-17: 5 mL via INTRAMUSCULAR

## 2015-12-17 MED ORDER — HEPARIN SODIUM (PORCINE) 5000 UNIT/ML IJ SOLN
INTRAMUSCULAR | Status: AC
  Filled 2015-12-17: qty 1

## 2015-12-17 MED ORDER — CEFAZOLIN SODIUM-DEXTROSE 2-4 GM/100ML-% IV SOLN
2.0000 g | INTRAVENOUS | Status: AC
  Administered 2015-12-17: 2 g via INTRAVENOUS

## 2015-12-17 MED ORDER — ACETAMINOPHEN 10 MG/ML IV SOLN
INTRAVENOUS | Status: DC | PRN
  Administered 2015-12-17: 1000 mg via INTRAVENOUS

## 2015-12-17 MED ORDER — MEPERIDINE HCL 25 MG/ML IJ SOLN: 6.2500 mg | INTRAMUSCULAR | Status: DC | PRN

## 2015-12-17 MED ORDER — DEXAMETHASONE SODIUM PHOSPHATE 10 MG/ML IJ SOLN
INTRAMUSCULAR | Status: DC | PRN
  Administered 2015-12-17: 5 mg via INTRAVENOUS

## 2015-12-17 MED ORDER — LACTATED RINGERS IV SOLN
INTRAVENOUS | Status: DC
  Administered 2015-12-17: 07:00:00 via INTRAVENOUS

## 2015-12-17 MED ORDER — FAMOTIDINE 20 MG PO TABS
ORAL_TABLET | ORAL | Status: AC
  Filled 2015-12-17: qty 1

## 2015-12-17 MED ORDER — PROMETHAZINE HCL 25 MG/ML IJ SOLN: 6.2500 mg | INTRAMUSCULAR | Status: DC | PRN

## 2015-12-17 MED ORDER — BUPIVACAINE-EPINEPHRINE (PF) 0.25% -1:200000 IJ SOLN
INTRAMUSCULAR | Status: AC
  Filled 2015-12-17: qty 30

## 2015-12-17 MED ORDER — MIDAZOLAM HCL 2 MG/2ML IJ SOLN
INTRAMUSCULAR | Status: DC | PRN
  Administered 2015-12-17: 1 mg via INTRAVENOUS

## 2015-12-17 MED ORDER — LIDOCAINE HCL (CARDIAC) 20 MG/ML IV SOLN
INTRAVENOUS | Status: DC | PRN
  Administered 2015-12-17: 50 mg via INTRAVENOUS

## 2015-12-17 MED ORDER — BUPIVACAINE-EPINEPHRINE (PF) 0.25% -1:200000 IJ SOLN
INTRAMUSCULAR | Status: DC | PRN
  Administered 2015-12-17: 5 mL

## 2015-12-17 SURGICAL SUPPLY — 32 items
BAG DECANTER FOR FLEXI CONT (MISCELLANEOUS) ×3 IMPLANT
BLADE SURG SZ11 CARB STEEL (BLADE) ×3 IMPLANT
BOOT SUTURE AID YELLOW STND (SUTURE) ×3 IMPLANT
CANISTER SUCT 1200ML W/VALVE (MISCELLANEOUS) ×3 IMPLANT
CHLORAPREP W/TINT 26ML (MISCELLANEOUS) ×3 IMPLANT
COVER LIGHT HANDLE STERIS (MISCELLANEOUS) ×6 IMPLANT
DRAPE C-ARM XRAY 36X54 (DRAPES) ×3 IMPLANT
DRAPE INCISE IOBAN 66X45 STRL (DRAPES) ×3 IMPLANT
DRAPE SURG 17X11 SM STRL (DRAPES) ×12 IMPLANT
ELECT REM PT RETURN 9FT ADLT (ELECTROSURGICAL) ×3
ELECTRODE REM PT RTRN 9FT ADLT (ELECTROSURGICAL) ×1 IMPLANT
GEL ULTRASOUND 20GR AQUASONIC (MISCELLANEOUS) ×3 IMPLANT
GLOVE BIO SURGEON STRL SZ7 (GLOVE) ×12 IMPLANT
GOWN STRL REUS W/ TWL LRG LVL3 (GOWN DISPOSABLE) ×2 IMPLANT
GOWN STRL REUS W/TWL LRG LVL3 (GOWN DISPOSABLE) ×4
IV NS 500ML (IV SOLUTION) ×2
IV NS 500ML BAXH (IV SOLUTION) ×1 IMPLANT
KIT PORT POWER 8FR ISP CVUE (Catheter) ×3 IMPLANT
LIQUID BAND (GAUZE/BANDAGES/DRESSINGS) ×3 IMPLANT
NEEDLE HYPO 25X1 1.5 SAFETY (NEEDLE) ×3 IMPLANT
NS IRRIG 1000ML POUR BTL (IV SOLUTION) ×3 IMPLANT
PACK PORT-A-CATH (MISCELLANEOUS) ×3 IMPLANT
SPONGE LAP 18X18 5 PK (GAUZE/BANDAGES/DRESSINGS) ×3 IMPLANT
SUT MNCRL AB 4-0 PS2 18 (SUTURE) ×3 IMPLANT
SUT PROLENE 2-0 (SUTURE) ×2
SUT PROLENE 2-0 RB1 36X2 ARM (SUTURE) ×1
SUT VIC AB 3-0 SH 27 (SUTURE) ×2
SUT VIC AB 3-0 SH 27X BRD (SUTURE) ×1 IMPLANT
SUTURE PROLEN 2-0 RB1 36X2 ARM (SUTURE) ×1 IMPLANT
SYR 20CC LL (SYRINGE) ×3 IMPLANT
SYR 5ML LL (SYRINGE) ×3 IMPLANT
TOWEL OR 17X26 4PK STRL BLUE (TOWEL DISPOSABLE) IMPLANT

## 2015-12-17 NOTE — H&P (View-Only) (Signed)
Surgical Consultation  12/04/2015  Dawn Wiley is an 81 y.o. female.   Chief Complaint  Patient presents with  . 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 Received adjuvant chemotherapy and was in a clinical trial. Unfortunately she now has metastatic disease to her chest wall mainly on her right side but there is also involvement on the inferior left chest wall skin including the breasts of the skin as well. And she did have poor on the left IJ placed by Dr. Ruthe Mannan but he was removed since it was not functional. Her disease has progressed and she is Pretty much right-sided complete chest wall involvement with some extension to the left breast. She will continue chemotherapy and is in need for Port-A-Cath. She has significant comorbidities including anemia, fragility, diabetes. She has significant independence and is able to walk and care after herself and is able to do all her ADLs independently      Past Medical History:  Diagnosis Date  . Anemia   . Arthritis   . Breast cancer ([place])   . Cancer [facility])    breast (right)  . Coronary artery disease    Coronary calcifications noted on CT scan  . Diabetes mellitus without complication ([place])   . Dysrhythmia    TACHYCARDIA-METOPROLOL CONTROLS THIS WELL  . Headache    H/O MIGRAINES  . Hyperlipidemia   . Hypertension   . Kidney stones   . Nephrolithiasis   . PONV (postoperative nausea and vomiting)   . Rash    back  . Tachycardia    Dr. Fletcher Anon, cardiologist    Past Surgical History:  Procedure Laterality Date  . BREAST SURGERY     breast biopsy X 3  . CATARACT EXTRACTION     bilateral  . EYE SURGERY     bilateral cataract extraction  . MASTECTOMY MODIFIED RADICAL Right 08/31/2014   Procedure: MASTECTOMY MODIFIED RADICAL;  Surgeon: Molly Maduro, MD;  Location: [facility] ORS;  Service: General;  Laterality: Right;  . PORT-A-CATH REMOVAL N/A 10/08/2015   Procedure: REMOVAL PORT-A-CATH;  Surgeon: Clayburn Pert, MD;  Location: [facility] ORS;  Service: General;  Laterality: N/A;  . PORTACATH PLACEMENT Left 10/20/2014   Procedure: INSERTION PORT-A-CATH;  Surgeon: Marlyce Huge, MD;  Location: [facility] ORS;  Service: General;  Laterality: Left;  . TEMPOROMANDIBULAR JOINT SURGERY      Family History  Problem Relation Age of Onset  . Peripheral Artery Disease Sister     carotid artery stenosis   . Heart disease Sister   . Diabetes Sister     Social History:  reports that she has never smoked. She has never used smokeless tobacco. She reports that she does not drink alcohol or use drugs.  Allergies: No Known Allergies  Medications reviewed.     ROS A 10 point review of system was performed and is otherwise negative other than what is stated on the history of present illness    BP (!) 146/69 (BP Location: Left Arm, Patient Position: Sitting, Cuff Size: Normal)   Pulse 85   Temp 97.8 F (36.6 C) (Oral)   Ht 5' (1.524 m)   Wt 48.5 kg (107 lb)   BMI 20.90 kg/m   Physical Exam Fragile and debilitated elderly female in no acute distress Neck: no JVD, no LAD.  No masses Breast: Skin involvement from Ca on the right and the left side. THe right side there is chest wall involvement laterally and almost circumferentially. Lungs: CTA , no wheezes CV: NSR, s1,s2 AQbd: soft, NT, no peritonitis Ext: no edema , well perfused and warm Neuro: GCS 15, awake alert, no motor or sensory deficits   No results found for this or any previous visit (from the past 48 hour(s)). No results found.  Assessment/Plan: 1. Inflammatory breast cancer, unspecified laterality [facility]) Prostatic inflammatory breast cancer in need for port placement. This poses a challenge given that her chest wall in the right side is involved with cancer and there  is a significant involvement of her left breast from inflammatory cancer. This will give [place] the option of placing a left IJ if in fact the jugular vein is patent and given the fact that she had a previous malfunctioning of the catheter we will order ultrasound of bilateral jugulars to evaluate for vein patency. If the left side is stenotic or there is thrombosis we may have to do a right IJ poor with the actual poor warning to the posterior triangle. Although this is not ideal this is one of the limited options. Lastly if these approaches fail we may be faced with placing a port peripheral Lane surgery into her left arm. She does not have enough and fat pad of the left forearm and therefore I think that her neck is the best choice for now. I will also order coags and we will place her in the OR schedule inj 10 days. Extensive counseling provided. Scars with the patient and the daughter about the surgery, the risk, benefits, possible complications and alternatives. They wish to proceed. I also specifically discussed the risk of vascular injury, pneumothorax, further intervention, infection. Scars with her about the option of not doing any further chemotherapy and at this point she is adamant that she wishes to have more time with her family. Time was spent in this encounter over 45 minutes with greater than 50% spent in counseling and coordination of care   - US Venous Img Upper Uni Right; Future - US Venous Img Upper Uni Left; Future - APTT; Future   Caroleen Hamman MD [place] Surgeon

## 2015-12-17 NOTE — Op Note (Signed)
  Pre-operative Diagnosis: Metastatic inflammatory breast cancer  Post-operative Diagnosis: Same   Surgeon: Caroleen Hamman, MD FACS  Anesthesia: IV sedation, marcaine .25% w epi and lidocaine 1%  Procedure: Left IJ  Port placement with fluoroscopy under U/S guidance  Findings: Good position of the tip of the catheter by fluoroscopy  Estimated Blood Loss: Minimal         Drains: None         Specimens: None       Complications: None            Procedure Details  The patient was seen again in the Holding Room. The benefits, complications, treatment options, and expected outcomes were discussed with the patient. The risks of bleeding, infection, recurrence of symptoms, failure to resolve symptoms,  thrombosis nonfunction breakage pneumothorax hemopneumothorax any of which could require chest tube or further surgery were reviewed with the patient.   The patient was taken to Operating Room, identified as Dawn Wiley and the procedure verified.  A Time Out was held and the above information confirmed.  Prior to the induction of general anesthesia, antibiotic prophylaxis was administered. VTE prophylaxis was in place. Appropriate anesthesia was then administered and tolerated well. The chest was prepped with Chloraprep and draped in the sterile fashion. The patient was positioned in the supine position. Then the patient was placed in Trendelenburg position.  Patient was prepped and draped in sterile fashion and in a Trendelenburg position local anesthetic was infiltrated into the skin and subcutaneous tissues in the neck and anterior chest wall. The large bore needle was placed into the Left internal jugular vein under U/S guidance without difficulty and then the Seldinger wire was advanced. Fluoroscopy was utilized to confirm that the Seldinger wire was in the superior vena cava.  An incision was made and a port pocket developed with blunt and electrocautery dissection. The introducer  dilator was placed over the Seldinger wire the wire was removed. The previously flushed catheter was placed into the introducer dilator and the peel-away sheath was removed. The catheter length was confirmed and trimmed utilizing fluoroscopy for proper positioning. The catheter was then attached to the previously flushed port. The port was placed into the pocket. The port was held in with 2-0 Prolenes and flushed for function and heparin locked.  The wound was closed with interrupted 3-0 Vicryl followed by 4-0 subcuticular Monocryl sutures. Dermabond used to coat the skin  Patient was taken to the recovery room in stable condition where a postoperative chest film has been ordered.

## 2015-12-17 NOTE — Anesthesia Postprocedure Evaluation (Signed)
Anesthesia Post Note  Patient: Dawn Wiley  Procedure(s) Performed: Procedure(s) (LRB): INSERTION PORT-A-CATH (Left)  Patient location during evaluation: PACU Anesthesia Type: General Level of consciousness: awake and alert and oriented Pain management: pain level controlled Vital Signs Assessment: post-procedure vital signs reviewed and stable Respiratory status: spontaneous breathing, nonlabored ventilation and respiratory function stable Cardiovascular status: blood pressure returned to baseline and stable Postop Assessment: no signs of nausea or vomiting Anesthetic complications: no    Last Vitals:  Vitals:   12/17/15 0929 12/17/15 0948  BP: (!) 105/55 (!) 102/54  Pulse:    Resp: 16 16  Temp: 36.7 C     Last Pain:  Vitals:   12/17/15 0929  TempSrc: Oral  PainSc:                  Shandrell Boda

## 2015-12-17 NOTE — Anesthesia Preprocedure Evaluation (Signed)
Anesthesia Evaluation  Patient identified by MRN, date of birth, ID band Patient awake    Reviewed: Allergy & Precautions, NPO status , Patient's Chart, lab work & pertinent test results  History of Anesthesia Complications (+) PONV  Airway Mallampati: II  TM Distance: >3 FB Neck ROM: Full    Dental  (+) Poor Dentition, Missing   Pulmonary shortness of breath, neg sleep apnea, neg COPD,    breath sounds clear to auscultation- rhonchi (-) wheezing      Cardiovascular hypertension, Pt. on medications and Pt. on home beta blockers (-) CAD, (-) Past MI and (-) Cardiac Stents + dysrhythmias (tachycardia)  Rhythm:Regular Rate:Normal - Systolic murmurs and - Diastolic murmurs    Neuro/Psych  Headaches, negative psych ROS   GI/Hepatic negative GI ROS, Neg liver ROS,   Endo/Other  Diabetes: hx of diabetes, no longer needs medication, last A1c 5.5.  Renal/GU Renal disease: nephrolithiasis.     Musculoskeletal  (+) Arthritis , Osteoarthritis,    Abdominal (+) - obese,   Peds  Hematology  (+) anemia ,   Anesthesia Other Findings Past Medical History: No date: Anemia No date: Arthritis No date: Breast cancer ([place]) No date: Cancer ([place])     Comment: breast (right) No date: Coronary artery disease     Comment: Coronary calcifications noted on CT scan No date: Diabetes mellitus without complication ([facility]) No date: Dysrhythmia     Comment: TACHYCARDIA-METOPROLOL CONTROLS THIS WELL No date: Headache     Comment: H/O MIGRAINES No date: Hyperlipidemia No date: Hypertension No date: Kidney stones No date: Nephrolithiasis No date: PONV (postoperative nausea and vomiting) No date: Rash     Comment: back No date: Tachycardia     Comment: Dr. Fletcher Anon, cardiologist   Reproductive/Obstetrics                             Anesthesia Physical Anesthesia Plan  ASA: III  Anesthesia Plan: General   Post-op  Pain Management:    Induction: Intravenous  Airway Management Planned: Natural Airway  Additional Equipment:   Intra-op Plan:   Post-operative Plan:   Informed Consent: I have reviewed the patients History and Physical, chart, labs and discussed the procedure including the risks, benefits and alternatives for the proposed anesthesia with the patient or authorized representative who has indicated his/her understanding and acceptance.   Dental advisory given  Plan Discussed with: CRNA and Anesthesiologist  Anesthesia Plan Comments:         Anesthesia Quick Evaluation

## 2015-12-17 NOTE — Anesthesia Procedure Notes (Signed)
Date/Time: 12/17/2015 7:39 AM Performed by: Johnna Acosta Pre-anesthesia Checklist: Patient identified, Emergency Drugs available, Suction available, Patient being monitored and Timeout performed Patient Re-evaluated:Patient Re-evaluated prior to inductionOxygen Delivery Method: Simple face mask

## 2015-12-17 NOTE — Telephone Encounter (Signed)
Mailed unread message to patient. thanks 

## 2015-12-17 NOTE — Transfer of Care (Signed)
Immediate Anesthesia Transfer of Care Note  Patient: Dawn Wiley  Procedure(s) Performed: Procedure(s): INSERTION PORT-A-CATH (Left)  Patient Location: PACU  Anesthesia Type:General  Level of Consciousness: awake and alert   Airway & Oxygen Therapy: Patient Spontanous Breathing and Patient connected to face mask oxygen  Post-op Assessment: Report given to RN and Post -op Vital signs reviewed and stable  Post vital signs: Reviewed and stable  Last Vitals:  Vitals:   12/17/15 0620  BP: 111/80  Pulse: 84  Resp: 16  Temp: 36.2 C    Last Pain:  Vitals:   12/17/15 0620  TempSrc: Tympanic         Complications: No apparent anesthesia complications

## 2015-12-17 NOTE — Interval H&P Note (Signed)
History and Physical Interval Note:  12/17/2015 7:23 AM  Dawn Wiley  has presented today for surgery, with the diagnosis of CA  The various methods of treatment have been discussed with the patient and family. After consideration of risks, benefits and other options for treatment, the patient has consented to  Procedure(s): INSERTION PORT-A-CATH (N/A) as a surgical intervention .  The patient's history has been reviewed, patient examined, no change in status, stable for surgery.  I have reviewed the patient's chart and labs.  Questions were answered to the patient's satisfaction.     [place]

## 2015-12-17 NOTE — Discharge Instructions (Signed)

## 2015-12-18 ENCOUNTER — Telehealth: Payer: Self-pay | Admitting: Internal Medicine

## 2015-12-18 NOTE — Telephone Encounter (Signed)
Pt is calling for her A1C numbers. She would like her labs sent to her by mail.( most resent labs). Please mark her A1C.numbers.

## 2015-12-18 NOTE — Telephone Encounter (Signed)
Results have been mailed

## 2015-12-20 ENCOUNTER — Ambulatory Visit: Payer: PPO

## 2015-12-21 ENCOUNTER — Telehealth: Payer: Self-pay | Admitting: *Deleted

## 2015-12-21 ENCOUNTER — Inpatient Hospital Stay: Payer: PPO

## 2015-12-21 ENCOUNTER — Encounter: Payer: Self-pay | Admitting: Hematology and Oncology

## 2015-12-21 ENCOUNTER — Inpatient Hospital Stay (HOSPITAL_BASED_OUTPATIENT_CLINIC_OR_DEPARTMENT_OTHER): Payer: PPO | Admitting: Hematology and Oncology

## 2015-12-21 ENCOUNTER — Other Ambulatory Visit: Payer: Self-pay | Admitting: Hematology and Oncology

## 2015-12-21 ENCOUNTER — Other Ambulatory Visit: Payer: Self-pay | Admitting: *Deleted

## 2015-12-21 VITALS — BP 110/65 | HR 88 | Temp 96.9°F | Resp 18 | Wt 109.7 lb

## 2015-12-21 DIAGNOSIS — I10 Essential (primary) hypertension: Secondary | ICD-10-CM

## 2015-12-21 DIAGNOSIS — C779 Secondary and unspecified malignant neoplasm of lymph node, unspecified: Secondary | ICD-10-CM

## 2015-12-21 DIAGNOSIS — L988 Other specified disorders of the skin and subcutaneous tissue: Secondary | ICD-10-CM

## 2015-12-21 DIAGNOSIS — L299 Pruritus, unspecified: Secondary | ICD-10-CM

## 2015-12-21 DIAGNOSIS — G893 Neoplasm related pain (acute) (chronic): Secondary | ICD-10-CM

## 2015-12-21 DIAGNOSIS — D649 Anemia, unspecified: Secondary | ICD-10-CM

## 2015-12-21 DIAGNOSIS — C792 Secondary malignant neoplasm of skin: Secondary | ICD-10-CM | POA: Diagnosis not present

## 2015-12-21 DIAGNOSIS — Z7984 Long term (current) use of oral hypoglycemic drugs: Secondary | ICD-10-CM

## 2015-12-21 DIAGNOSIS — Z87442 Personal history of urinary calculi: Secondary | ICD-10-CM

## 2015-12-21 DIAGNOSIS — Z171 Estrogen receptor negative status [ER-]: Secondary | ICD-10-CM | POA: Diagnosis not present

## 2015-12-21 DIAGNOSIS — Z8669 Personal history of other diseases of the nervous system and sense organs: Secondary | ICD-10-CM

## 2015-12-21 DIAGNOSIS — C50911 Malignant neoplasm of unspecified site of right female breast: Secondary | ICD-10-CM

## 2015-12-21 DIAGNOSIS — Z5112 Encounter for antineoplastic immunotherapy: Secondary | ICD-10-CM | POA: Diagnosis not present

## 2015-12-21 DIAGNOSIS — R918 Other nonspecific abnormal finding of lung field: Secondary | ICD-10-CM

## 2015-12-21 DIAGNOSIS — R000 Tachycardia, unspecified: Secondary | ICD-10-CM

## 2015-12-21 DIAGNOSIS — M818 Other osteoporosis without current pathological fracture: Secondary | ICD-10-CM

## 2015-12-21 DIAGNOSIS — IMO0001 Reserved for inherently not codable concepts without codable children: Secondary | ICD-10-CM

## 2015-12-21 DIAGNOSIS — Z79899 Other long term (current) drug therapy: Secondary | ICD-10-CM

## 2015-12-21 DIAGNOSIS — D509 Iron deficiency anemia, unspecified: Secondary | ICD-10-CM

## 2015-12-21 DIAGNOSIS — E119 Type 2 diabetes mellitus without complications: Secondary | ICD-10-CM

## 2015-12-21 DIAGNOSIS — Z7982 Long term (current) use of aspirin: Secondary | ICD-10-CM

## 2015-12-21 DIAGNOSIS — E785 Hyperlipidemia, unspecified: Secondary | ICD-10-CM

## 2015-12-21 DIAGNOSIS — E538 Deficiency of other specified B group vitamins: Secondary | ICD-10-CM

## 2015-12-21 DIAGNOSIS — M129 Arthropathy, unspecified: Secondary | ICD-10-CM

## 2015-12-21 DIAGNOSIS — I2510 Atherosclerotic heart disease of native coronary artery without angina pectoris: Secondary | ICD-10-CM

## 2015-12-21 DIAGNOSIS — R21 Rash and other nonspecific skin eruption: Secondary | ICD-10-CM

## 2015-12-21 LAB — COMPREHENSIVE METABOLIC PANEL
ALT: 9 U/L — ABNORMAL LOW (ref 14–54)
AST: 16 U/L (ref 15–41)
Albumin: 3.3 g/dL — ABNORMAL LOW (ref 3.5–5.0)
Alkaline Phosphatase: 62 U/L (ref 38–126)
Anion gap: 6 (ref 5–15)
BUN: 10 mg/dL (ref 6–20)
CO2: 26 mmol/L (ref 22–32)
Calcium: 8.4 mg/dL — ABNORMAL LOW (ref 8.9–10.3)
Chloride: 101 mmol/L (ref 101–111)
Creatinine, Ser: 0.48 mg/dL (ref 0.44–1.00)
GFR calc Af Amer: >60 mL/min (ref >60)
GFR calc non Af Amer: >60 mL/min (ref >60)
Glucose, Bld: 178 mg/dL — ABNORMAL HIGH (ref 65–99)
Potassium: 3.7 mmol/L (ref 3.5–5.1)
Sodium: 133 mmol/L — ABNORMAL LOW (ref 135–145)
Total Bilirubin: 0.4 mg/dL (ref 0.3–1.2)
Total Protein: 6.2 g/dL — ABNORMAL LOW (ref 6.5–8.1)

## 2015-12-21 LAB — CBC WITH DIFFERENTIAL/PLATELET
Basophils Absolute: 0 10*3/uL (ref 0–0.1)
Basophils Relative: 0 %
Eosinophils Absolute: 0 10*3/uL (ref 0–0.7)
Eosinophils Relative: 0 %
HCT: 24.9 % — ABNORMAL LOW (ref 35.0–47.0)
Hemoglobin: 8.2 g/dL — ABNORMAL LOW (ref 12.0–16.0)
Lymphocytes Relative: 6 %
Lymphs Abs: 0.5 10*3/uL — ABNORMAL LOW (ref 1.0–3.6)
MCH: 27.7 pg (ref 26.0–34.0)
MCHC: 32.9 g/dL (ref 32.0–36.0)
MCV: 84.2 fL (ref 80.0–100.0)
Monocytes Absolute: 1.3 10*3/uL — ABNORMAL HIGH (ref 0.2–0.9)
Monocytes Relative: 14 %
Neutro Abs: 7.8 10*3/uL — ABNORMAL HIGH (ref 1.4–6.5)
Neutrophils Relative %: 80 %
Platelets: 269 10*3/uL (ref 150–440)
RBC: 2.96 MIL/uL — ABNORMAL LOW (ref 3.80–5.20)
RDW: 17.2 % — ABNORMAL HIGH (ref 11.5–14.5)
WBC: 9.7 10*3/uL (ref 3.6–11.0)

## 2015-12-21 LAB — PREPARE RBC (CROSSMATCH)

## 2015-12-21 MED ORDER — ACETAMINOPHEN 325 MG PO TABS
650.0000 mg | ORAL_TABLET | Freq: Once | ORAL | Status: AC
  Administered 2015-12-21: 650 mg via ORAL
  Filled 2015-12-21: qty 2

## 2015-12-21 MED ORDER — SODIUM CHLORIDE 0.9% FLUSH
10.0000 mL | INTRAVENOUS | Status: DC | PRN
  Administered 2015-12-21: 10 mL
  Filled 2015-12-21: qty 10

## 2015-12-21 MED ORDER — HEPARIN SOD (PORK) LOCK FLUSH 100 UNIT/ML IV SOLN
500.0000 [IU] | Freq: Once | INTRAVENOUS | Status: AC | PRN
  Administered 2015-12-21: 500 [IU]
  Filled 2015-12-21: qty 5

## 2015-12-21 MED ORDER — SODIUM CHLORIDE 0.9 % IV SOLN
Freq: Once | INTRAVENOUS | Status: AC
  Administered 2015-12-21: 11:00:00 via INTRAVENOUS
  Filled 2015-12-21: qty 4

## 2015-12-21 MED ORDER — HYDROXYZINE HCL 25 MG PO TABS: 25.0000 mg | ORAL_TABLET | Freq: Three times a day (TID) | ORAL | 0 refills | Status: DC | PRN

## 2015-12-21 MED ORDER — ONDANSETRON HCL 4 MG PO TABS: 4.0000 mg | ORAL_TABLET | Freq: Four times a day (QID) | ORAL | 0 refills | Status: AC | PRN

## 2015-12-21 MED ORDER — CAPECITABINE 500 MG PO TABS: 1000.0000 mg/m2 | ORAL_TABLET | Freq: Two times a day (BID) | ORAL | 0 refills | Status: AC

## 2015-12-21 MED ORDER — SODIUM CHLORIDE 0.9 % IV SOLN
Freq: Once | INTRAVENOUS | Status: AC
  Administered 2015-12-21: 11:00:00 via INTRAVENOUS
  Filled 2015-12-21: qty 1000

## 2015-12-21 MED ORDER — TRASTUZUMAB CHEMO 150 MG IV SOLR
2.0000 mg/kg | Freq: Once | INTRAVENOUS | Status: AC
  Administered 2015-12-21: 105 mg via INTRAVENOUS
  Filled 2015-12-21: qty 5

## 2015-12-21 MED ORDER — DIPHENHYDRAMINE HCL 25 MG PO CAPS
50.0000 mg | ORAL_CAPSULE | Freq: Once | ORAL | Status: AC
  Administered 2015-12-21: 50 mg via ORAL
  Filled 2015-12-21: qty 2

## 2015-12-21 MED ORDER — LAPATINIB DITOSYLATE 250 MG PO TABS: 1250.0000 mg | ORAL_TABLET | Freq: Every day | ORAL | 0 refills | Status: AC

## 2015-12-21 MED ORDER — HYDROXYZINE HCL 25 MG PO TABS: ORAL_TABLET | ORAL | 0 refills | Status: DC

## 2015-12-21 NOTE — Telephone Encounter (Signed)
resubmitted

## 2015-12-21 NOTE — Progress Notes (Signed)
Patient is here for follow up, she has mentioned after treatment last week she had really bad nausea cause she wasn't given her pre med before treatment.

## 2015-12-21 NOTE — Progress Notes (Addendum)
[facility] day:  12/21/15   Chief Complaint: Dawn Wiley is an 81 y.o. female with stage IV right breast cancer who is seen for assessment prior to week #4 of Herceptin and Navelbine.  HPI: The patient was last seen in the medical oncology clinic on 12/07/2015.  At that time, she received week #2 Herceptin and Navelbine.  She received Herceptin alone on 12/14/2015.  She has her port-a-cath placed on 12/17/2015.  Labs on 12/07/2015 revealed a ferritin of 37.  Iron saturation was 4% with a TIBC of 420.  Folate was 28.  During the interim, patient has had ongoing progression of disease. She notes that her left breast is now involved.  She states that the lesions are still draining. Once in awhile, she has some pain. She also has some intermittent pruritus.  She states that she is tired this week and wants to sleep a lot.  She is unsure if this is due to her family's recent trip to the beach.   Past Medical History:  Diagnosis Date  . Anemia   . Arthritis   . Breast cancer ([place])   . Cancer [facility])    breast (right)  . Coronary artery disease    Coronary calcifications noted on CT scan  . Diabetes mellitus without complication ([place])   . Dysrhythmia    TACHYCARDIA-METOPROLOL CONTROLS THIS WELL  . Headache    H/O MIGRAINES  . Hyperlipidemia   . Hypertension   . Kidney stones   . Nephrolithiasis   . PONV (postoperative nausea and vomiting)   . Rash    back  . Tachycardia    Dr. Fletcher Anon, cardiologist    Past Surgical History:  Procedure Laterality Date  . BREAST SURGERY     breast biopsy X 3  . CATARACT EXTRACTION     bilateral  . EYE SURGERY     bilateral cataract extraction  . MASTECTOMY MODIFIED RADICAL Right 08/31/2014   Procedure: MASTECTOMY MODIFIED RADICAL;  Surgeon: Molly Maduro, MD;  Location: [facility] ORS;  Service: General;  Laterality: Right;  . PORT-A-CATH REMOVAL N/A 10/08/2015   Procedure: REMOVAL PORT-A-CATH;   Surgeon: Clayburn Pert, MD;  Location: [facility] ORS;  Service: General;  Laterality: N/A;  . PORTACATH PLACEMENT Left 10/20/2014   Procedure: INSERTION PORT-A-CATH;  Surgeon: Marlyce Huge, MD;  Location: [facility] ORS;  Service: General;  Laterality: Left;  . PORTACATH PLACEMENT Left 12/17/2015   Procedure: INSERTION PORT-A-CATH;  Surgeon: Jules Husbands, MD;  Location: [facility] ORS;  Service: General;  Laterality: Left;  . TEMPOROMANDIBULAR JOINT SURGERY      Family History  Problem Relation Age of Onset  . Peripheral Artery Disease Sister     carotid artery stenosis   . Heart disease Sister   . Diabetes Sister     Social History:  reports that she has never smoked. She has never used smokeless tobacco. She reports that she does not drink alcohol or use drugs.  The patient is accompanied by her daughter today.  Her daughter's phone number is [phone].    Allergies: No Known Allergies  Current Medications: Current Outpatient Prescriptions  Medication Sig Dispense Refill  . acetaminophen (TYLENOL) 500 MG tablet Take 500 mg by mouth every 6 (six) hours as needed for mild pain or moderate pain.     [name] Kitchen amLODipine (NORVASC) 5 MG tablet Take 1 tablet (5 mg total) by mouth daily. (Patient taking differently: Take 5 mg by mouth every morning. )  90 tablet 1  . aspirin 81 MG tablet Take 81 mg by mouth every other day.    . Calcium Carbonate (CALTRATE 600 PO) Take 1,200 mg by mouth daily.     . cholecalciferol (VITAMIN D) 400 UNITS TABS tablet Take 800 Units by mouth.    . Cyanocobalamin (VITAMIN B 12 PO) Take 1 tablet by mouth every morning.    . Cyanocobalamin (VITAMIN B-12 IJ) Inject as directed every 30 (thirty) days.     . diphenhydrAMINE (BENADRYL) 25 mg capsule Take 25 mg by mouth daily.     . ferrous sulfate 325 (65 FE) MG tablet Take 325 mg by mouth daily.     [name] Kitchen HYDROcodone-acetaminophen (NORCO) 7.5-325 MG tablet Take 1 tablet by mouth every 6 (six) hours as needed for moderate pain. 60  tablet 0  . metoprolol tartrate (LOPRESSOR) 25 MG tablet Take 1 tablet (25 mg total) by mouth 2 (two) times daily. 180 tablet 2  . potassium chloride (K-DUR,KLOR-CON) 10 MEQ tablet Take 1 tablet (10 mEq total) by mouth daily. 30 tablet 2  . temazepam (RESTORIL) 15 MG capsule 1 tablet at night as needed for sleep 30 capsule 1   No current facility-administered medications for this visit.    Facility-Administered Medications Ordered in Other Visits  Medication Dose Route Frequency Provider Last Rate Last Dose  . heparin lock flush 100 unit/mL  500 Units Intracatheter Once PRN [name], MD      . sodium chloride 0.9 % injection 10 mL  10 mL Intravenous PRN [name], MD      . sodium chloride 0.9 % injection 10 mL  10 mL Intravenous PRN [name], MD   10 mL at 03/21/15 0849  . sodium chloride 0.9 % injection 10 mL  10 mL Intracatheter PRN [name], MD   10 mL at 04/25/15 0834  . sodium chloride flush (NS) 0.9 % injection 10 mL  10 mL Intravenous PRN [name], MD   10 mL at 09/18/15 1423  . sodium chloride flush (NS) 0.9 % injection 10 mL  10 mL Intracatheter PRN [name], MD   10 mL at 12/21/15 0855    Review of Systems:  GENERAL: Fatigue. No fevers or sweats.Weight up 1 pound. PERFORMANCE STATUS (ECOG): 1 HEENT: No visual changes, runny nose, sore throat, or tenderness. Lungs: No shortness of breath or cough. No hemoptysis. Cardiac: No chest pain, palpitations, orthopnea, or PND. GI: No nausea, vomiting, diarrhea, constipation, melena or hematochezia. GU: No urgency, frequency, dysuria, or hematuria. Musculoskeletal: No back pain. No joint pain. No muscle tenderness. Extremities: No pain or swelling. Skin: Pruritic lesions.  Some chest wall lesions bleeding. Neuro: No headache, numbness or weakness, balance or coordination issues. Endocrine: Diabetes.  No thyroid issues, hot flashes or night sweats. Psych: No mood  changes.  Denies depression or anxiety.  Trouble sleeping. Pain: Chest wall lesions uncomfortable. Review of systems: All other systems reviewed and found to be negative.  Physical Exam: Blood pressure 110/65, pulse 88, temperature (!) 96.9 F (36.1 C), temperature source Tympanic, resp. rate 18, weight 109 lb 10.9 oz (49.7 kg). GENERAL: Thin fatigued appearing elderly woman sitting comfortably in the exam room in no acute distress. MENTAL STATUS: Alert and oriented to person, place and time. HEAD: Short gray hair. Normocephalic, atraumatic, face symmetric, no Cushingoid features. EYES: Blue eyes. Pupils equal round and reactive to light and accomodation.  No conjunctivitis or scleral icterus. RESPIRATORY:  Clear to auscultation without rales, wheezes or rhonchi. CARDIOVASCULAR:  Regular rate and rhythm without murmur, rub or gallop. BREAST: Right mastectomy. Enlarging right chest wall lesions measuring 6.5 x 6 cm, 2 x 5 cm, 3 x 3 cm, 3.5 x 5 cm.  There are several new smaller nodular lesions around the larger lesions.  Nodular area medially involving left breast with development of left breast ruddy erythema and induration.  Enlarging right lateral back mass measuring 12 x 16 cm.  Extent of disease 41 cm anterior to posterior x 21 cm cranial to caudal.  Disease involving upper abdominal wall. ABDOMEN:  Soft, non-tender, with active bowel sounds, and no hepatosplenomegaly.  No masses. SKIN: Chest wall lesions as above.  Upper right arm 8.5 x 5 cm nodular lesion. EXTREMITIES: No edema, no skin discoloration or tenderness. No palpable cords. LYMPH NODES: No palpable axillary adenopathy.  Small right supraclavicular node. NEUROLOGICAL: Unremarkable. PSYCH: Appropriate.   Infusion on 12/21/2015  Component Date Value Ref Range Status  . WBC 12/21/2015 9.7  3.6 - 11.0 K/uL Final  . RBC 12/21/2015 2.96* 3.80 - 5.20 MIL/uL Final  . Hemoglobin 12/21/2015 8.2* 12.0 - 16.0 g/dL Final  .  HCT 12/21/2015 24.9* 35.0 - 47.0 % Final  . MCV 12/21/2015 84.2  80.0 - 100.0 fL Final  . MCH 12/21/2015 27.7  26.0 - 34.0 pg Final  . MCHC 12/21/2015 32.9  32.0 - 36.0 g/dL Final  . RDW 12/21/2015 17.2* 11.5 - 14.5 % Final  . Platelets 12/21/2015 269  150 - 440 K/uL Final  . Neutrophils Relative % 12/21/2015 80  % Final  . Neutro Abs 12/21/2015 7.8* 1.4 - 6.5 K/uL Final  . Lymphocytes Relative 12/21/2015 6  % Final  . Lymphs Abs 12/21/2015 0.5* 1.0 - 3.6 K/uL Final  . Monocytes Relative 12/21/2015 14  % Final  . Monocytes Absolute 12/21/2015 1.3* 0.2 - 0.9 K/uL Final  . Eosinophils Relative 12/21/2015 0  % Final  . Eosinophils Absolute 12/21/2015 0.0  0 - 0.7 K/uL Final  . Basophils Relative 12/21/2015 0  % Final  . Basophils Absolute 12/21/2015 0.0  0 - 0.1 K/uL Final  . Sodium 12/21/2015 133* 135 - 145 mmol/L Final  . Potassium 12/21/2015 3.7  3.5 - 5.1 mmol/L Final  . Chloride 12/21/2015 101  101 - 111 mmol/L Final  . CO2 12/21/2015 26  22 - 32 mmol/L Final  . Glucose, Bld 12/21/2015 178* 65 - 99 mg/dL Final  . BUN 12/21/2015 10  6 - 20 mg/dL Final  . Creatinine, Ser 12/21/2015 0.48  0.44 - 1.00 mg/dL Final  . Calcium 12/21/2015 8.4* 8.9 - 10.3 mg/dL Final  . Total Protein 12/21/2015 6.2* 6.5 - 8.1 g/dL Final  . Albumin 12/21/2015 3.3* 3.5 - 5.0 g/dL Final  . AST 12/21/2015 16  15 - 41 U/L Final  . ALT 12/21/2015 9* 14 - 54 U/L Final  . Alkaline Phosphatase 12/21/2015 62  38 - 126 U/L Final  . Total Bilirubin 12/21/2015 0.4  0.3 - 1.2 mg/dL Final  . GFR calc non Af Amer 12/21/2015 >60  >60 mL/min Final  . GFR calc Af Amer 12/21/2015 >60  >60 mL/min Final   Comment: (NOTE) The eGFR has been calculated using the CKD EPI equation. This calculation has not been validated in all clinical situations. eGFR's persistently <60 mL/min signify possible Chronic Kidney Disease.   . Anion gap 12/21/2015 6  5 - 15 Final    Assessment:  Dawn Wiley  Dawn Wiley is an 81 y.o. female with  metastatic breast cancer.  She presented with stage IIIC right breast cancer status post mastectomy and axillary lymph node dissection on 08/31/2014. 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 Carcinoma involved skeletal muscle and dermal lymphatics. Tumor was less than 0.5 mm from the deep margin. Thirteen of 15 lymph nodes were involved. The largest metastatic focus was 2 cm. Tumor is triple negative (ER negative, PR negative, and HER-2/neu negative).  Androgen receptor and PDL-1 testing was negative.  Pathologic stage was [id number].  PET scan on 09/21/2014 revealed postoperative changes in the right chest with no suspicious findings or residual tumor. Bone scan on 10/11/2014 revealed no evidence of metastatic disease.  Bone density study on 10/02/2014 revealed a T score of -4.7 in the left forearm consistent with osteoporosis. T-score was-3.6 in the right femur.   Echo on 10/02/2014 revealed ejection fraction of 60-65%.  Echo on 06/27/2015 revealed EF of 55-60%.  Echo on 08/31/2015 revealed EF of 55%.  Echo on 12/05/2015 revealed an EF of 55-60%.  She has B12 deficiency. She began B12 on 10/12/2014 (last 11/30/2015).  Folate was normal.  Her diet is modest. She has never had a colonoscopy. She denies any melena or hematochezia.  She takes 1 iron pill a day (additional iron causes diarrhea).  Ferritin was 116 on 04/25/2015.  Chest wall biopsy x 3 (lateral, medial, and middle sites) on 11/08/2014 revealed dermal lymphatic involvement by invasive mammary carcinoma with micropapillary features.  Right arm punch biopsy on 06/28/2015 confirmed breast cancer.  She has aggressive disease with growth despite several chemotherapeutic agents.  She received 5 weeks of Taxol (11/03/2014 - 12/01/2014).  She received 1 week of adriamycin (12/14/2014).  She received 1 cycle of carboplatin (12/28/2014).  After carboplatin, disease appeared to stabilize,  but with 2 weeks off of therapy, her disease grew again (medial lesion larger and weaping).   She received 3 cycles of carboplatin and Taxol (01/18/2015 - 03/02/2015).  Chest wall lesions initially decreased then began to grow.  She received 2 cycles of Adriamycin and Cytoxan (03/26/2015 - 04/11/2015) with Neulasta support.  Chest wall disease began to grow before cycle #3 could be administered.  PET scan on 04/26/2015 revealed extensive recurrence about the right chest.  The far lateral component measured 4.2 x 2.2 cm (S.U.V.13.5).  A portion superficial to the right-side of the sternum measured 5.2 x 2.2 cm (S.U.V. 9.1).  There was diffuse more inferior and lateral chest wall soft tissue thickening and hypermetabolism.    CA27.29 was 118.4 on 05/02/2015, 92.9 on 06/11/2015, 38.5 on 07/03/2015, 27.4 on 07/24/2015, and 169.6 on 12/07/2015.  She received 5 weeks of concurrent carboplatin and radiation (05/14/2015 - 06/11/2015).  Radiation completed on 06/15/2015.  Cutaneous lesions in the field of radiation improved dramatically.    [facility] testing on 05/22/2015 returned genomic alterations of ERBB2 (S310F and V777L), FGFR1 (amplification), CDH1 (E490*), CDKN2A (p16INK4a H83Y and p14ARF A97V), MYST3 (amplification), TERT (promotor -124C>T), TP53  (Y220C), and ZNF703 (amplification).  FDA approved therapies included ado-trastuzumab, lapatinib, pertuzumab, trastuzumab for ERBB2 as well as afatinib (approved in other tumor type) and pazopanib and ponatinib (approved in other tumor types for FGFR1).  She received 8 weeks of Herceptin (07/03/2015 - 08/21/2015).  Lesions continued to progress.  PET scan on 08/31/2015 revealed the interval development of multifocal hypermetabolic skin thickening and masses, representing new cutaneous metastases. Interval resolution of superior right anterior chest wall subcutaneous  lesion.  There was interval development of multiple hypermetabolic left axillary lymph  nodes compatible with metastasis. There was interval resolution of right axillary hypermetabolic lymph nodes. Bone scan on 08/31/2015 revealed no evidence of metastatic disease.    She received neratinib and Faslodex (began 09/07/2015) on a [facility] clinical trial.  She initially had a dramatic response.  She then developed rapid growth.  Weekly Herceptin was added on 10/26/2015.   Chest, abdomen, and pelvic CT scan at [facility] of 11/01/2015 revealed interval increase in nodular skin thickening overlying the left anterior chest/upper abdomen. There was decreased left axillary adenopathy. There was interval development of anterior right middle and right upper lobe groundglass and consolidative opacities. There was circumferential wall thickening of the ascending colon. There was a new subcentimeter liver lesion too small to characterize.  Bone scan on 11/01/2015 at [facility] revealed no evidence of metastatic disease.     She is s/p 3 weeks of Herceptin and Navelbine (11/30/2015 - 12/14/2015).  She has normocytic anemia likely due multiple etiologies (chronic disease and chemotherapy induced).  Labs on 08/111/2017 revealed iron deficiency with a ferritin of 37 and an iron saturation of 4%.  Folate was normal.  Symptomatically, she is fatigued. Chest wall lesions continue grow.  Lesions are uncomfortable and at times are pruritic and bleed.   Plan: 1.  Labs today:  CBC with diff, CMP, CA27.29, type and screen. 2.  Discuss progressive disease. Discuss patient's thoughts about ongoing treatment.  Discuss consideration of Xeloda and lapatinib (XL).  Side effects reviewed.  Discuss palliative radiation.  Discuss ongoing Her2/neu suppression.  Discuss continuation of Herceptin until lapatinib initiation.  Discuss discontinuation of Navelbine.  Patient wishes to try laptinib ([place]kerb) and Xeloda.  Patient considering palliative radiation if progresses on XL. 3.  Discuss fatigue likely secondary to disease as well as  anemia.  Discuss transfusion of 1 unit PRBCs on 12/24/2015. 4.  Patient's daughter inquired about BRCA1/2 testing.  Will seek insurance coverage. 5.  Herceptin today. 6.  Preauth lapatinib and Xeloda. 7.  Preauth Venofer. 8.  Medical pictures today- done. 9.  Rx:  hydroxyzine 12.5-25 mg po q 6 hours prn pruritus. 10.  RTC for MD assessment prior to initiation of Xeloda and lapatinib.   [name], MD  12/21/2015, 9:30 AM

## 2015-12-21 NOTE — Patient Instructions (Signed)
Lapatinib oral tablet What is this medicine? LAPATINIB (la PA ti nib) is a medicine that targets proteins in cancer cells and stops the cancer cells from growing. It is used to treat some advanced breast cancers. It is often used along with other cancer treatments. This medicine may be used for other purposes; ask your health care provider or pharmacist if you have questions. What should I tell my health care provider before I take this medicine? They need to know if you have any of these conditions: -heart disease -heart rhythm problems (irregular heartbeat) -high blood pressure -history of low levels of potassium or magnesium -liver disease -an unusual reaction to lapatinib, other medicines, foods, dyes, or preservatives -pregnant or trying to get pregnant -breast-feeding How should I use this medicine? Take this medicine by mouth with a glass of water. Take this medicine on an empty stomach, at least 1 hour before or after a meal. Do not take with food. Follow the directions on the prescription label. Do not take your medicine more often than directed. Do not stop taking except on your doctor's advice. Talk to your pediatrician regarding the use of this medicine in children. Special care may be needed. This medicine is not approved for use in children. Overdosage: If you think you have taken too much of this medicine contact a poison control center or emergency room at once. NOTE: This medicine is only for you. Do not share this medicine with others. What if I miss a dose? If you miss a dose, take it as soon as you can. If it is almost time for your next dose, take only that dose. Do not take double or extra doses. What may interact with this medicine? Do not take this medicine with any of the following: -certain medicines for fungal infections like fluconazole, itraconazole, ketoconazole, posaconazole, voriconazole -cisapride -dofetilide -dronedarone -grapefruit or grapefruit  juice -pimozide -thioridazine -ziprasidone This medicine may also interact with the following medications: -antiviral medicines for HIV or AIDS -certain antibiotics like clarithromycin, erythromycin, rifabutin, rifampin, rifapentine, telithromycin -certain medicines for depression, anxiety, or psychotic disturbances -certain medicines for irregular heart beat -certain medicines for seizures like carbamazepine, phenobarbital, phenytoin -certain medicines for stomach problems like cimetidine, famotidine, ranitidine, omeprazole -cyclosporine -daunorubicin -dexamethasone -diltiazem -doxorubicin -other medicines that prolong the QT interval (cause an abnormal heart rhythm) -St. John's Wort or other herbal supplements -verapamil This list may not describe all possible interactions. Give your health care provider a list of all the medicines, herbs, non-prescription drugs, or dietary supplements you use. Also tell them if you smoke, drink alcohol, or use illegal drugs. Some items may interact with your medicine. What should I watch for while using this medicine? Visit your doctor for checks on your progress. You will need to have regular blood tests while on this medicine. Report any new symptoms promptly. Let your doctor know if you have any change in bowel patterns, nausea, loss of appetite, or vomiting, as these side effects may cause dehydration. If you have shortness of breath, contact your doctor. Possible serious side effects include heart or lung problems. Do not become pregnant while taking this medicine. Women should inform their doctor if they wish to become pregnant or think they might be pregnant. There is a potential for serious side effects to an unborn child. Talk to your health care professional or pharmacist for more information. Do not breast-feed an infant while taking this medicine. What side effects may I notice from receiving this medicine? Side effects  that you should report  to your doctor or health care professional as soon as possible: -allergic reactions like skin rash, itching or hives, swelling of the face, lips, or tongue -breathing problems -chest pain -cough that does not go away -dizziness -feeling faint or lightheaded, falls -fever or chills, sore throat -palpitations -redness, blistering, peeling or loosening of the skin, including inside the mouth -severe or persistent diarrhea -swelling of feet, legs -unusual bleeding, bruising -vomiting -yellowing of the eyes or skin Side effects that usually do not require medical attention (report to your doctor or health care professional if they continue or are bothersome): -decreased appetite -diarrhea -dry skin -heartburn -mouth sores -nausea -red, numb, swollen, or painful hands or feet -stomach upset -tired This list may not describe all possible side effects. Call your doctor for medical advice about side effects. You may report side effects to FDA at 1-800-FDA-1088. Where should I keep my medicine? Keep out of the reach of children. Store at room temperature between 15 and 30 degrees C (59 and 86 degrees F). Protect from moisture. Keep tightly closed. Throw away any unused medicine after the expiration date. NOTE: This sheet is a summary. It may not cover all possible information. If you have questions about this medicine, talk to your doctor, pharmacist, or health care provider.    2016, [employer] Standard. (2014-06-21 BY:[id number]) Capecitabine tablets What is this medicine? CAPECITABINE (ka pe SITE a been) is a chemotherapy drug. It slows the growth of cancer cells. This medicine is used to treat breast cancer, and also colon or rectal cancer. This medicine may be used for other purposes; ask your health care provider or pharmacist if you have questions. What should I tell my health care provider before I take this medicine? They need to know if you have any of these conditions: -bleeding  or blood disorders -dihydropyrimidine dehydrogenase (DPD) deficiency -heart disease -infection (especially a virus infection such as chickenpox, cold sores, or herpes) -kidney disease -liver disease -an unusual or allergic reaction to capecitabine, 5-fluorouracil, other medicines, foods, dyes, or preservatives -pregnant or trying to get pregnant -breast-feeding How should I use this medicine? Take this medicine by mouth with a glass of water, within 30 minutes of the end of a meal. Do not cut, crush or chew this medicine. Follow the directions on the prescription label. Take your medicine at regular intervals. Do not take it more often than directed. Do not stop taking except on your doctor's advice. Your doctor may want you to take a combination of 150 mg and 500 mg tablets for each dose. It is very important that you know how to correctly take your dose. Taking the wrong tablets could result in an overdose (too much medication) or underdose (too little medication). Talk to your pediatrician regarding the use of this medicine in children. Special care may be needed. Overdosage: If you think you have taken too much of this medicine contact a poison control center or emergency room at once. NOTE: This medicine is only for you. Do not share this medicine with others. What if I miss a dose? If you miss a dose, do not take the missed dose at all. Do not take double or extra doses. Instead, continue with your next scheduled dose and check with your doctor. What may interact with this medicine? -antacids with aluminum and/or magnesium -folic acid -leucovorin -medicines to increase blood counts like filgrastim, pegfilgrastim, sargramostim -phenytoin -vaccines -warfarin Talk to your doctor or health care professional before taking  any of these medicines: -acetaminophen -aspirin -ibuprofen -ketoprofen -naproxen This list may not describe all possible interactions. Give your health care provider  a list of all the medicines, herbs, non-prescription drugs, or dietary supplements you use. Also tell them if you smoke, drink alcohol, or use illegal drugs. Some items may interact with your medicine. What should I watch for while using this medicine? Visit your doctor for checks on your progress. This drug may make you feel generally unwell. This is not uncommon, as chemotherapy can affect healthy cells as well as cancer cells. Report any side effects. Continue your course of treatment even though you feel ill unless your doctor tells you to stop. In some cases, you may be given additional medicines to help with side effects. Follow all directions for their use. Call your doctor or health care professional for advice if you get a fever, chills or sore throat, or other symptoms of a cold or flu. Do not treat yourself. This drug decreases your body's ability to fight infections. Try to avoid being around people who are sick. This medicine may increase your risk to bruise or bleed. Call your doctor or health care professional if you notice any unusual bleeding. Be careful brushing and flossing your teeth or using a toothpick because you may get an infection or bleed more easily. If you have any dental work done, tell your dentist you are receiving this medicine. Avoid taking products that contain aspirin, acetaminophen, ibuprofen, naproxen, or ketoprofen unless instructed by your doctor. These medicines may hide a fever. Do not become pregnant while taking this medicine. Women should inform their doctor if they wish to become pregnant or think they might be pregnant. There is a potential for serious side effects to an unborn child. Talk to your health care professional or pharmacist for more information. Do not breast-feed an infant while taking this medicine. Men are advised not to father a child while taking this medicine. What side effects may I notice from receiving this medicine? Side effects that you  should report to your doctor or health care professional as soon as possible: -allergic reactions like skin rash, itching or hives, swelling of the face, lips, or tongue -low blood counts - this medicine may decrease the number of white blood cells, red blood cells and platelets. You may be at increased risk for infections and bleeding. -signs of infection - fever or chills, cough, sore throat, pain or difficulty passing urine -signs of decreased platelets or bleeding - bruising, pinpoint red spots on the skin, black, tarry stools, blood in the urine -signs of decreased red blood cells - unusually weak or tired, fainting spells, lightheadedness -breathing problems -changes in vision -chest pain -dark urine -diarrhea of more than 4 bowel movements in one day or any diarrhea at night; bloody or watery diarrhea -dizziness -mouth sores -nausea and vomiting -pain, tingling, numbness in the hands or feet -redness, swelling, or sores on hands or feet -stomach pain -vomiting -yellow color of skin or eyes Side effects that usually do not require medical attention (report to your doctor or health care professional if they continue or are bothersome): -constipation -diarrhea -dry or itchy skin -hair loss -loss of appetite -nausea -weak or tired This list may not describe all possible side effects. Call your doctor for medical advice about side effects. You may report side effects to FDA at 1-800-FDA-1088. Where should I keep my medicine? Keep out of the reach of children. Store at room temperature between 15  and 30 degrees C (59 and 86 degrees F). Keep container tightly closed. Throw away any unused medicine after the expiration date. NOTE: This sheet is a summary. It may not cover all possible information. If you have questions about this medicine, talk to your doctor, pharmacist, or health care provider.    2016, [employer] Standard. (2014-05-29 17:03:30)

## 2015-12-22 DIAGNOSIS — L299 Pruritus, unspecified: Secondary | ICD-10-CM | POA: Insufficient documentation

## 2015-12-22 LAB — CANCER ANTIGEN 27.29: CA 27.29: 221.5 U/mL — ABNORMAL HIGH (ref 0.0–38.6)

## 2015-12-24 ENCOUNTER — Inpatient Hospital Stay: Payer: PPO

## 2015-12-24 DIAGNOSIS — Z5112 Encounter for antineoplastic immunotherapy: Secondary | ICD-10-CM | POA: Diagnosis not present

## 2015-12-24 DIAGNOSIS — D649 Anemia, unspecified: Secondary | ICD-10-CM

## 2015-12-24 MED ORDER — HEPARIN SOD (PORK) LOCK FLUSH 100 UNIT/ML IV SOLN
500.0000 [IU] | Freq: Every day | INTRAVENOUS | Status: AC | PRN
  Administered 2015-12-24: 500 [IU]
  Filled 2015-12-24: qty 5

## 2015-12-24 MED ORDER — DIPHENHYDRAMINE HCL 25 MG PO CAPS: 25.0000 mg | ORAL_CAPSULE | Freq: Once | ORAL | Status: DC

## 2015-12-24 MED ORDER — ACETAMINOPHEN 325 MG PO TABS
650.0000 mg | ORAL_TABLET | Freq: Once | ORAL | Status: AC
  Administered 2015-12-24: 650 mg via ORAL
  Filled 2015-12-24: qty 2

## 2015-12-24 MED ORDER — SODIUM CHLORIDE 0.9 % IV SOLN
250.0000 mL | Freq: Once | INTRAVENOUS | Status: AC
  Administered 2015-12-24: 250 mL via INTRAVENOUS
  Filled 2015-12-24: qty 250

## 2015-12-24 MED ORDER — SODIUM CHLORIDE 0.9% FLUSH
10.0000 mL | INTRAVENOUS | Status: AC | PRN
  Administered 2015-12-24: 10 mL
  Filled 2015-12-24: qty 10

## 2015-12-25 ENCOUNTER — Other Ambulatory Visit: Payer: Self-pay | Admitting: *Deleted

## 2015-12-25 LAB — TYPE AND SCREEN
ABO/RH(D): A POS
Antibody Screen: NEGATIVE
Unit division: 0

## 2015-12-26 ENCOUNTER — Other Ambulatory Visit (INDEPENDENT_AMBULATORY_CARE_PROVIDER_SITE_OTHER): Payer: PPO

## 2015-12-26 ENCOUNTER — Telehealth: Payer: Self-pay | Admitting: Internal Medicine

## 2015-12-26 DIAGNOSIS — R300 Dysuria: Secondary | ICD-10-CM

## 2015-12-26 LAB — POCT URINALYSIS DIPSTICK
Glucose, UA: NEGATIVE
Ketones, UA: 15
Nitrite, UA: POSITIVE
PH UA: 5.5
PROTEIN UA: 100
UROBILINOGEN UA: 0.2

## 2015-12-26 NOTE — Telephone Encounter (Signed)
Pt called back about the UTI is back. Pt last was in the office on 11/28/15. Pt would like to know if a Rx can be called in pt is not feeling well to come in. Please advise?  Pharmacy is [facility], [place]  Call pt @ [phone]. Thank you!

## 2015-12-26 NOTE — Telephone Encounter (Signed)
Can she come by today to labs an give [place] a urine specimen ? We need to run it to be sure she has a UTI and what organism is causing it. If the dipstick is positive, I will treat if she is very uncomfortable,  but we may need to change antibiotics if the culture suggests it won't work (this will take 3 days for those results)

## 2015-12-26 NOTE — Telephone Encounter (Signed)
Pt called back. Please advise if pt needs to come in? Thank you!

## 2015-12-26 NOTE — Telephone Encounter (Signed)
Spoke with the patient, she was not going to come in today as her cancer on her chest has been causing her to not feel well.  I explained that we needed a urine specimen to be able to treat appropriately.  I asked if there was any way she could send someone to get the specimen container and she stated that she will see if maybe her daughter could come by.  I again explained that not all UTI are treated the same and that we can't treat what we don't have knowledge of.  She said she would see what she can do.

## 2015-12-26 NOTE — Progress Notes (Signed)
Pt was hoping to start on something soon & not have to wait on the culture.

## 2015-12-26 NOTE — Telephone Encounter (Signed)
Patient was seen on 8/2 for a UTI, positive E coli, put on Cipro course.  Believes it has come back, please advise for appt, or meds, thanks

## 2015-12-27 ENCOUNTER — Other Ambulatory Visit: Payer: Self-pay | Admitting: Hematology and Oncology

## 2015-12-27 ENCOUNTER — Encounter: Payer: Self-pay | Admitting: Internal Medicine

## 2015-12-27 ENCOUNTER — Other Ambulatory Visit: Payer: Self-pay | Admitting: Internal Medicine

## 2015-12-27 LAB — URINALYSIS, ROUTINE W REFLEX MICROSCOPIC
NITRITE: POSITIVE — AB
PH: 5.5 (ref 5.0–8.0)
Specific Gravity, Urine: >=1.03 — AB (ref 1.000–1.030)
TOTAL PROTEIN, URINE-UPE24: NEGATIVE
Urine Glucose: NEGATIVE
Urobilinogen, UA: 0.2 (ref 0.0–1.0)

## 2015-12-27 MED ORDER — CIPROFLOXACIN HCL 250 MG PO TABS: 250.0000 mg | ORAL_TABLET | Freq: Two times a day (BID) | ORAL | 0 refills | Status: DC

## 2015-12-27 NOTE — Telephone Encounter (Signed)
Spoke with the patients daughter, confirmed that a Rx was recently sent this afternoon, thanks

## 2015-12-27 NOTE — Telephone Encounter (Signed)
Pt daughter called wanting to know if a Rx was called in to the pharmacy? Please advise?  Call daughter @ [phone]. Thank you!

## 2015-12-28 ENCOUNTER — Other Ambulatory Visit: Payer: Self-pay | Admitting: Hematology and Oncology

## 2015-12-28 ENCOUNTER — Telehealth: Payer: Self-pay | Admitting: *Deleted

## 2015-12-28 ENCOUNTER — Inpatient Hospital Stay: Payer: PPO | Attending: Hematology and Oncology

## 2015-12-28 VITALS — BP 95/58 | HR 79 | Temp 97.1°F | Resp 16

## 2015-12-28 DIAGNOSIS — R630 Anorexia: Secondary | ICD-10-CM | POA: Diagnosis not present

## 2015-12-28 DIAGNOSIS — M549 Dorsalgia, unspecified: Secondary | ICD-10-CM | POA: Insufficient documentation

## 2015-12-28 DIAGNOSIS — R918 Other nonspecific abnormal finding of lung field: Secondary | ICD-10-CM | POA: Diagnosis not present

## 2015-12-28 DIAGNOSIS — K769 Liver disease, unspecified: Secondary | ICD-10-CM | POA: Insufficient documentation

## 2015-12-28 DIAGNOSIS — Z171 Estrogen receptor negative status [ER-]: Secondary | ICD-10-CM | POA: Insufficient documentation

## 2015-12-28 DIAGNOSIS — R5383 Other fatigue: Secondary | ICD-10-CM | POA: Insufficient documentation

## 2015-12-28 DIAGNOSIS — I2510 Atherosclerotic heart disease of native coronary artery without angina pectoris: Secondary | ICD-10-CM | POA: Diagnosis not present

## 2015-12-28 DIAGNOSIS — Z923 Personal history of irradiation: Secondary | ICD-10-CM | POA: Insufficient documentation

## 2015-12-28 DIAGNOSIS — E538 Deficiency of other specified B group vitamins: Secondary | ICD-10-CM | POA: Diagnosis not present

## 2015-12-28 DIAGNOSIS — S60219A Contusion of unspecified wrist, initial encounter: Secondary | ICD-10-CM | POA: Diagnosis not present

## 2015-12-28 DIAGNOSIS — C792 Secondary malignant neoplasm of skin: Secondary | ICD-10-CM | POA: Diagnosis not present

## 2015-12-28 DIAGNOSIS — R000 Tachycardia, unspecified: Secondary | ICD-10-CM | POA: Diagnosis not present

## 2015-12-28 DIAGNOSIS — E119 Type 2 diabetes mellitus without complications: Secondary | ICD-10-CM | POA: Insufficient documentation

## 2015-12-28 DIAGNOSIS — Z79899 Other long term (current) drug therapy: Secondary | ICD-10-CM | POA: Insufficient documentation

## 2015-12-28 DIAGNOSIS — D649 Anemia, unspecified: Secondary | ICD-10-CM | POA: Insufficient documentation

## 2015-12-28 DIAGNOSIS — E785 Hyperlipidemia, unspecified: Secondary | ICD-10-CM | POA: Insufficient documentation

## 2015-12-28 DIAGNOSIS — I10 Essential (primary) hypertension: Secondary | ICD-10-CM | POA: Diagnosis not present

## 2015-12-28 DIAGNOSIS — C779 Secondary and unspecified malignant neoplasm of lymph node, unspecified: Secondary | ICD-10-CM | POA: Insufficient documentation

## 2015-12-28 DIAGNOSIS — Z87442 Personal history of urinary calculi: Secondary | ICD-10-CM | POA: Insufficient documentation

## 2015-12-28 DIAGNOSIS — R21 Rash and other nonspecific skin eruption: Secondary | ICD-10-CM | POA: Insufficient documentation

## 2015-12-28 DIAGNOSIS — R590 Localized enlarged lymph nodes: Secondary | ICD-10-CM | POA: Insufficient documentation

## 2015-12-28 DIAGNOSIS — M129 Arthropathy, unspecified: Secondary | ICD-10-CM | POA: Insufficient documentation

## 2015-12-28 DIAGNOSIS — C50911 Malignant neoplasm of unspecified site of right female breast: Secondary | ICD-10-CM | POA: Insufficient documentation

## 2015-12-28 DIAGNOSIS — M818 Other osteoporosis without current pathological fracture: Secondary | ICD-10-CM | POA: Diagnosis not present

## 2015-12-28 DIAGNOSIS — Z9011 Acquired absence of right breast and nipple: Secondary | ICD-10-CM | POA: Insufficient documentation

## 2015-12-28 DIAGNOSIS — Z5112 Encounter for antineoplastic immunotherapy: Secondary | ICD-10-CM | POA: Insufficient documentation

## 2015-12-28 DIAGNOSIS — Z7982 Long term (current) use of aspirin: Secondary | ICD-10-CM | POA: Insufficient documentation

## 2015-12-28 LAB — URINE CULTURE

## 2015-12-28 MED ORDER — TRASTUZUMAB CHEMO 150 MG IV SOLR
2.0000 mg/kg | Freq: Once | INTRAVENOUS | Status: AC
  Administered 2015-12-28: 105 mg via INTRAVENOUS
  Filled 2015-12-28: qty 5

## 2015-12-28 MED ORDER — ACETAMINOPHEN 325 MG PO TABS
650.0000 mg | ORAL_TABLET | Freq: Once | ORAL | Status: AC
  Administered 2015-12-28: 650 mg via ORAL
  Filled 2015-12-28: qty 2

## 2015-12-28 MED ORDER — DIPHENHYDRAMINE HCL 25 MG PO CAPS
50.0000 mg | ORAL_CAPSULE | Freq: Once | ORAL | Status: AC
  Administered 2015-12-28: 50 mg via ORAL
  Filled 2015-12-28: qty 2

## 2015-12-28 MED ORDER — SODIUM CHLORIDE 0.9 % IV SOLN
Freq: Once | INTRAVENOUS | Status: AC
  Administered 2015-12-28: 09:00:00 via INTRAVENOUS
  Filled 2015-12-28: qty 1000

## 2015-12-28 MED ORDER — HEPARIN SOD (PORK) LOCK FLUSH 100 UNIT/ML IV SOLN
500.0000 [IU] | Freq: Once | INTRAVENOUS | Status: AC | PRN
  Administered 2015-12-28: 500 [IU]
  Filled 2015-12-28: qty 5

## 2015-12-28 NOTE — Progress Notes (Signed)
Metastasis areas increasing in size, pt has dressings covering areas, did see wound MD earlier for assistance with wound coverage

## 2015-12-28 NOTE — Telephone Encounter (Signed)
Copay for Tykerb and Capecitabine is high, can apply for assistance with Tykerb, but there is none for Capecitabine and the copay will be $400, Asking if she can afford that and if she should continue processing order. Please return her call. [place]

## 2015-12-28 NOTE — Telephone Encounter (Signed)
Per Dawn Wiley meeting her co pay is for medical this had to be filed under Part D prescription coverageand early statement is still effective. I asked if the first month bill for Capecitabine can be sent to our office and be paid by our charitable funds once only whilst the SW finds a foundation to help her with assistance. She will email her financial dept and if they have questions they will contact [employer] or myself

## 2015-12-28 NOTE — Progress Notes (Signed)
Patient's BP is 95/58 today and she does not offer any symptoms.  She did take her Amlodipine and Metoprolol this morning.  MD notified

## 2015-12-28 NOTE — Telephone Encounter (Signed)
[name] called and stated that she thought Dawn Wiley has reached her out of pocket limit for the year and that she has a catastrophic plan with her insurance and wants to know if we checked with them. Asking Judeen Hammans call her back.

## 2015-12-31 ENCOUNTER — Encounter: Payer: Self-pay | Admitting: Internal Medicine

## 2016-01-01 ENCOUNTER — Other Ambulatory Visit: Payer: Self-pay | Admitting: *Deleted

## 2016-01-01 MED ORDER — LIDOCAINE-PRILOCAINE 2.5-2.5 % EX CREA: TOPICAL_CREAM | CUTANEOUS | 1 refills | Status: AC

## 2016-01-02 ENCOUNTER — Other Ambulatory Visit: Payer: Self-pay | Admitting: Internal Medicine

## 2016-01-02 ENCOUNTER — Other Ambulatory Visit: Payer: Self-pay | Admitting: *Deleted

## 2016-01-02 DIAGNOSIS — I10 Essential (primary) hypertension: Secondary | ICD-10-CM

## 2016-01-02 DIAGNOSIS — R000 Tachycardia, unspecified: Secondary | ICD-10-CM

## 2016-01-02 NOTE — Telephone Encounter (Addendum)
Needs a refill on Hydrocodone. Asking if the quantity can be increased now that she is having to take it more often.

## 2016-01-02 NOTE — Telephone Encounter (Signed)
  That is fine.  Does she get good relief at the current dose?  m

## 2016-01-03 ENCOUNTER — Ambulatory Visit: Payer: PPO | Admitting: Surgery

## 2016-01-03 ENCOUNTER — Other Ambulatory Visit: Payer: Self-pay | Admitting: *Deleted

## 2016-01-03 ENCOUNTER — Telehealth: Payer: Self-pay | Admitting: *Deleted

## 2016-01-03 DIAGNOSIS — C50911 Malignant neoplasm of unspecified site of right female breast: Secondary | ICD-10-CM

## 2016-01-03 DIAGNOSIS — IMO0001 Reserved for inherently not codable concepts without codable children: Secondary | ICD-10-CM

## 2016-01-03 MED ORDER — HYDROCODONE-ACETAMINOPHEN 7.5-325 MG PO TABS: 1.0000 | ORAL_TABLET | Freq: Four times a day (QID) | ORAL | 0 refills | Status: DC | PRN

## 2016-01-03 NOTE — Telephone Encounter (Signed)
Ought she was to get Herceptin until she starts new med, she does not have an appt for tomorrow. Please advise

## 2016-01-04 ENCOUNTER — Inpatient Hospital Stay (HOSPITAL_BASED_OUTPATIENT_CLINIC_OR_DEPARTMENT_OTHER): Payer: PPO | Admitting: Hematology and Oncology

## 2016-01-04 ENCOUNTER — Other Ambulatory Visit: Payer: Self-pay | Admitting: Hematology and Oncology

## 2016-01-04 ENCOUNTER — Inpatient Hospital Stay: Payer: PPO

## 2016-01-04 VITALS — BP 127/74 | HR 81 | Temp 95.7°F | Resp 18 | Wt 104.5 lb

## 2016-01-04 DIAGNOSIS — C792 Secondary malignant neoplasm of skin: Secondary | ICD-10-CM | POA: Diagnosis not present

## 2016-01-04 DIAGNOSIS — E119 Type 2 diabetes mellitus without complications: Secondary | ICD-10-CM

## 2016-01-04 DIAGNOSIS — C50911 Malignant neoplasm of unspecified site of right female breast: Secondary | ICD-10-CM | POA: Diagnosis not present

## 2016-01-04 DIAGNOSIS — R21 Rash and other nonspecific skin eruption: Secondary | ICD-10-CM

## 2016-01-04 DIAGNOSIS — R634 Abnormal weight loss: Secondary | ICD-10-CM

## 2016-01-04 DIAGNOSIS — D509 Iron deficiency anemia, unspecified: Secondary | ICD-10-CM

## 2016-01-04 DIAGNOSIS — Z7982 Long term (current) use of aspirin: Secondary | ICD-10-CM

## 2016-01-04 DIAGNOSIS — R000 Tachycardia, unspecified: Secondary | ICD-10-CM

## 2016-01-04 DIAGNOSIS — IMO0001 Reserved for inherently not codable concepts without codable children: Secondary | ICD-10-CM

## 2016-01-04 DIAGNOSIS — R590 Localized enlarged lymph nodes: Secondary | ICD-10-CM

## 2016-01-04 DIAGNOSIS — M818 Other osteoporosis without current pathological fracture: Secondary | ICD-10-CM

## 2016-01-04 DIAGNOSIS — Z79899 Other long term (current) drug therapy: Secondary | ICD-10-CM

## 2016-01-04 DIAGNOSIS — C779 Secondary and unspecified malignant neoplasm of lymph node, unspecified: Secondary | ICD-10-CM

## 2016-01-04 DIAGNOSIS — G893 Neoplasm related pain (acute) (chronic): Secondary | ICD-10-CM

## 2016-01-04 DIAGNOSIS — Z171 Estrogen receptor negative status [ER-]: Secondary | ICD-10-CM

## 2016-01-04 DIAGNOSIS — D649 Anemia, unspecified: Secondary | ICD-10-CM

## 2016-01-04 DIAGNOSIS — I2510 Atherosclerotic heart disease of native coronary artery without angina pectoris: Secondary | ICD-10-CM

## 2016-01-04 DIAGNOSIS — Z923 Personal history of irradiation: Secondary | ICD-10-CM

## 2016-01-04 DIAGNOSIS — I10 Essential (primary) hypertension: Secondary | ICD-10-CM

## 2016-01-04 DIAGNOSIS — Z5112 Encounter for antineoplastic immunotherapy: Secondary | ICD-10-CM | POA: Diagnosis not present

## 2016-01-04 DIAGNOSIS — R630 Anorexia: Secondary | ICD-10-CM

## 2016-01-04 DIAGNOSIS — Z9011 Acquired absence of right breast and nipple: Secondary | ICD-10-CM

## 2016-01-04 DIAGNOSIS — E785 Hyperlipidemia, unspecified: Secondary | ICD-10-CM

## 2016-01-04 DIAGNOSIS — K769 Liver disease, unspecified: Secondary | ICD-10-CM

## 2016-01-04 DIAGNOSIS — M129 Arthropathy, unspecified: Secondary | ICD-10-CM

## 2016-01-04 DIAGNOSIS — R5383 Other fatigue: Secondary | ICD-10-CM

## 2016-01-04 DIAGNOSIS — E538 Deficiency of other specified B group vitamins: Secondary | ICD-10-CM

## 2016-01-04 DIAGNOSIS — Z87442 Personal history of urinary calculi: Secondary | ICD-10-CM

## 2016-01-04 DIAGNOSIS — R918 Other nonspecific abnormal finding of lung field: Secondary | ICD-10-CM

## 2016-01-04 LAB — CBC WITH DIFFERENTIAL/PLATELET
Basophils Absolute: 0.1 10*3/uL (ref 0–0.1)
Basophils Relative: 1 %
Eosinophils Absolute: 0.1 10*3/uL (ref 0–0.7)
Eosinophils Relative: 1 %
HCT: 30.5 % — ABNORMAL LOW (ref 35.0–47.0)
Hemoglobin: 9.9 g/dL — ABNORMAL LOW (ref 12.0–16.0)
Lymphocytes Relative: 11 %
Lymphs Abs: 1.6 10*3/uL (ref 1.0–3.6)
MCH: 27.8 pg (ref 26.0–34.0)
MCHC: 32.6 g/dL (ref 32.0–36.0)
MCV: 85.4 fL (ref 80.0–100.0)
Monocytes Absolute: 1.2 10*3/uL — ABNORMAL HIGH (ref 0.2–0.9)
Monocytes Relative: 8 %
Neutro Abs: 11.7 10*3/uL — ABNORMAL HIGH (ref 1.4–6.5)
Neutrophils Relative %: 79 %
Platelets: 275 10*3/uL (ref 150–440)
RBC: 3.57 MIL/uL — ABNORMAL LOW (ref 3.80–5.20)
RDW: 18.4 % — ABNORMAL HIGH (ref 11.5–14.5)
WBC: 14.7 10*3/uL — ABNORMAL HIGH (ref 3.6–11.0)

## 2016-01-04 MED ORDER — HEPARIN SOD (PORK) LOCK FLUSH 100 UNIT/ML IV SOLN
500.0000 [IU] | Freq: Once | INTRAVENOUS | Status: AC | PRN
  Administered 2016-01-04: 500 [IU]

## 2016-01-04 MED ORDER — SODIUM CHLORIDE 0.9% FLUSH
10.0000 mL | Freq: Once | INTRAVENOUS | Status: AC
  Administered 2016-01-04: 10 mL via INTRAVENOUS
  Filled 2016-01-04: qty 10

## 2016-01-04 MED ORDER — HEPARIN SOD (PORK) LOCK FLUSH 100 UNIT/ML IV SOLN
500.0000 [IU] | Freq: Once | INTRAVENOUS | Status: DC
  Filled 2016-01-04: qty 5

## 2016-01-04 MED ORDER — SODIUM CHLORIDE 0.9 % IV SOLN
Freq: Once | INTRAVENOUS | Status: AC
  Administered 2016-01-04: 11:00:00 via INTRAVENOUS
  Filled 2016-01-04: qty 1000

## 2016-01-04 MED ORDER — DIPHENHYDRAMINE HCL 25 MG PO CAPS
50.0000 mg | ORAL_CAPSULE | Freq: Once | ORAL | Status: AC
  Administered 2016-01-04: 50 mg via ORAL
  Filled 2016-01-04: qty 2

## 2016-01-04 MED ORDER — SODIUM CHLORIDE 0.9% FLUSH
10.0000 mL | INTRAVENOUS | Status: DC | PRN
  Filled 2016-01-04: qty 10

## 2016-01-04 MED ORDER — TRASTUZUMAB CHEMO 150 MG IV SOLR
2.0000 mg/kg | Freq: Once | INTRAVENOUS | Status: AC
  Administered 2016-01-04: 105 mg via INTRAVENOUS
  Filled 2016-01-04: qty 5

## 2016-01-04 MED ORDER — ACETAMINOPHEN 325 MG PO TABS
650.0000 mg | ORAL_TABLET | Freq: Once | ORAL | Status: AC
Start: 2016-01-04 — End: 2016-01-04
  Administered 2016-01-04: 650 mg via ORAL
  Filled 2016-01-04: qty 2

## 2016-01-04 NOTE — Progress Notes (Signed)
[facility] day:  01/04/16   Chief Complaint: Dawn Wiley is an 81 y.o. female with stage IV right breast cancer who is seen for assessment prior to continutation of weekly Herceptin and discussion regarding direction of therapy.   HPI: The patient was last seen in the medical oncology clinic on 12/21/2015.  At that time, she was fatigued. Chest wall lesions continued to grow.  They were uncomfortable and at times pruritic and bled.  Decision was made to continue Herceptin  As it appeared to slow disease progression prior to beginning Xeloda and laptinib (XL).    She had progressive symptomatic anemia.  She was transfused with 1 unit PRBCs on 12/21/2015.  Venofer was preauthorized.  Insurance coverage and cost issues have delayed initiation of XL.  Symptomatically, she states that she has felt better since her transfusion. Her color has improved. Regarding her iron, she states that 2 pills a day "tear up my stomach".  She is only taking 1 iron pill a day. Regarding her breast cancer, places bleed after she takes of her bandages. The lesions inferiorly are drying up. She is eating less.    Past Medical History:  Diagnosis Date  . Anemia   . Arthritis   . Breast cancer ([place])   . Cancer [facility])    breast (right)  . Coronary artery disease    Coronary calcifications noted on CT scan  . Diabetes mellitus without complication ([place])   . Dysrhythmia    TACHYCARDIA-METOPROLOL CONTROLS THIS WELL  . Headache    H/O MIGRAINES  . Hyperlipidemia   . Hypertension   . Kidney stones   . Nephrolithiasis   . PONV (postoperative nausea and vomiting)   . Rash    back  . Tachycardia    Dr. Fletcher Anon, cardiologist    Past Surgical History:  Procedure Laterality Date  . BREAST SURGERY     breast biopsy X 3  . CATARACT EXTRACTION     bilateral  . EYE SURGERY     bilateral cataract extraction  . MASTECTOMY MODIFIED RADICAL Right 08/31/2014   Procedure:  MASTECTOMY MODIFIED RADICAL;  Surgeon: Molly Maduro, MD;  Location: [facility] ORS;  Service: General;  Laterality: Right;  . PORT-A-CATH REMOVAL N/A 10/08/2015   Procedure: REMOVAL PORT-A-CATH;  Surgeon: Clayburn Pert, MD;  Location: [facility] ORS;  Service: General;  Laterality: N/A;  . PORTACATH PLACEMENT Left 10/20/2014   Procedure: INSERTION PORT-A-CATH;  Surgeon: Marlyce Huge, MD;  Location: [facility] ORS;  Service: General;  Laterality: Left;  . PORTACATH PLACEMENT Left 12/17/2015   Procedure: INSERTION PORT-A-CATH;  Surgeon: Jules Husbands, MD;  Location: [facility] ORS;  Service: General;  Laterality: Left;  . TEMPOROMANDIBULAR JOINT SURGERY      Family History  Problem Relation Age of Onset  . Peripheral Artery Disease Sister     carotid artery stenosis   . Heart disease Sister   . Diabetes Sister     Social History:  reports that she has never smoked. She has never used smokeless tobacco. She reports that she does not drink alcohol or use drugs.  The patient is alone today.  Her daughter's phone number is [phone].    Allergies: No Known Allergies  Current Medications: Current Outpatient Prescriptions  Medication Sig Dispense Refill  . acetaminophen (TYLENOL) 500 MG tablet Take 500 mg by mouth every 6 (six) hours as needed for mild pain or moderate pain.     [name] Kitchen amLODipine (NORVASC) 5  MG tablet Take 1 tablet (5 mg total) by mouth daily. (Patient taking differently: Take 5 mg by mouth every morning. ) 90 tablet 1  . aspirin 81 MG tablet Take 81 mg by mouth every other day.    . Calcium Carbonate (CALTRATE 600 PO) Take 1,200 mg by mouth daily.     . capecitabine (XELODA) 500 MG tablet Take 3 tablets (1,500 mg total) by mouth 2 (two) times daily after a meal. for 14 days then 7 days off. 84 tablet 0  . cholecalciferol (VITAMIN D) 400 UNITS TABS tablet Take 800 Units by mouth.    . Cyanocobalamin (VITAMIN B 12 PO) Take 1 tablet by mouth every morning.    . Cyanocobalamin (VITAMIN B-12  IJ) Inject as directed every 30 (thirty) days.     . diphenhydrAMINE (BENADRYL) 25 mg capsule Take 25 mg by mouth daily.     . ferrous sulfate 325 (65 FE) MG tablet Take 325 mg by mouth daily.     [name] Kitchen HYDROcodone-acetaminophen (NORCO) 7.5-325 MG tablet Take 1 tablet by mouth every 6 (six) hours as needed for moderate pain. 60 tablet 0  . hydrOXYzine (ATARAX/VISTARIL) 25 MG tablet Take 1/2 tablet to 1 tablet every 6 hours as needed for pruritis 30 tablet 0  . lapatinib (TYKERB) 250 MG tablet Take 5 tablets (1,250 mg total) by mouth daily. 150 tablet 0  . lidocaine-prilocaine (EMLA) cream Apply cream 1 hour before chemotherapy treatment, cover cream with saran wrap to protect clothing 30 g 1  . metoprolol tartrate (LOPRESSOR) 25 MG tablet take 1 tablet by mouth twice a day 180 tablet 2  . ondansetron (ZOFRAN) 4 MG tablet Take 1 tablet (4 mg total) by mouth every 6 (six) hours as needed for nausea or vomiting. 30 tablet 0  . potassium chloride (K-DUR,KLOR-CON) 10 MEQ tablet Take 1 tablet (10 mEq total) by mouth daily. 30 tablet 2  . temazepam (RESTORIL) 15 MG capsule 1 tablet at night as needed for sleep 30 capsule 1   No current facility-administered medications for this visit.    Facility-Administered Medications Ordered in Other Visits  Medication Dose Route Frequency Provider Last Rate Last Dose  . heparin lock flush 100 unit/mL  500 Units Intravenous Once [name], MD      . sodium chloride 0.9 % injection 10 mL  10 mL Intravenous PRN [name], MD      . sodium chloride 0.9 % injection 10 mL  10 mL Intravenous PRN [name], MD   10 mL at 03/21/15 0849  . sodium chloride 0.9 % injection 10 mL  10 mL Intracatheter PRN [name], MD   10 mL at 04/25/15 0834  . sodium chloride flush (NS) 0.9 % injection 10 mL  10 mL Intravenous PRN [name], MD   10 mL at 09/18/15 1423    Review of Systems:  GENERAL: Feels better since transfusion. No fevers or  sweats.Weight down 4 pounds. PERFORMANCE STATUS (ECOG): 1-2 HEENT: No visual changes, runny nose, sore throat, or tenderness. Lungs: No shortness of breath or cough. No hemoptysis. Cardiac: No chest pain, palpitations, orthopnea, or PND. GI: No nausea, vomiting, diarrhea, constipation, melena or hematochezia. GU: No urgency, frequency, dysuria, or hematuria. Musculoskeletal: No back pain. No joint pain. No muscle tenderness. Extremities: No pain or swelling. Skin: Pruritic lesions.  Some chest wall lesions bleed after taking off dressing. Neuro: No headache, numbness or weakness, balance or coordination issues. Endocrine: Diabetes.  No thyroid issues, hot flashes or night sweats. Psych: No mood changes.  Denies depression or anxiety.  Trouble sleeping. Pain: Chest wall lesions well controlled with hydrocodone 7.5/acetaminophen 325. Review of systems: All other systems reviewed and found to be negative.  Physical Exam: Blood pressure 127/74, pulse 81, temperature (!) 95.7 F (35.4 C), temperature source Tympanic, resp. rate 18, weight 104 lb 8 oz (47.4 kg). GENERAL: Thin chronically ill appearing elderly woman sitting comfortably in the exam room in no acute distress. MENTAL STATUS: Alert and oriented to person, place and time. HEAD: Short gray hair. Normocephalic, atraumatic, face symmetric, no Cushingoid features. EYES: Blue eyes. Pupils equal round and reactive to light and accomodation.  No conjunctivitis or scleral icterus. RESPIRATORY:  Clear to auscultation without rales, wheezes or rhonchi. CARDIOVASCULAR:  Regular rate and rhythm without murmur, rub or gallop. BREAST: Right mastectomy. Medial conglomerate of  lesions spanning 13 cm.  Left breast with ruddy erythema and induration. Right lateral back mass measuring 16 cm.  Extent of disease 41 cm anterior to posterior.  Disease involving upper abdominal wall (dry).  Some lesions laterally and medially bleed  with dressing change. ABDOMEN:  Soft, non-tender, with active bowel sounds, and no hepatosplenomegaly.  No masses. SKIN: Chest wall lesions as above.  Upper right arm 8 x 3 cm nodular lesion. EXTREMITIES: No edema, no skin discoloration or tenderness. No palpable cords. LYMPH NODES: No palpable axillary adenopathy.  Small right supraclavicular node. NEUROLOGICAL: Unremarkable. PSYCH: Appropriate.    Infusion on 01/04/2016  Component Date Value Ref Range Status  . WBC 01/04/2016 14.7* 3.6 - 11.0 K/uL Final  . RBC 01/04/2016 3.57* 3.80 - 5.20 MIL/uL Final  . Hemoglobin 01/04/2016 9.9* 12.0 - 16.0 g/dL Final  . HCT 01/04/2016 30.5* 35.0 - 47.0 % Final  . MCV 01/04/2016 85.4  80.0 - 100.0 fL Final  . MCH 01/04/2016 27.8  26.0 - 34.0 pg Final  . MCHC 01/04/2016 32.6  32.0 - 36.0 g/dL Final  . RDW 01/04/2016 18.4* 11.5 - 14.5 % Final  . Platelets 01/04/2016 275  150 - 440 K/uL Final  . Neutrophils Relative % 01/04/2016 79  % Final  . Neutro Abs 01/04/2016 11.7* 1.4 - 6.5 K/uL Final  . Lymphocytes Relative 01/04/2016 11  % Final  . Lymphs Abs 01/04/2016 1.6  1.0 - 3.6 K/uL Final  . Monocytes Relative 01/04/2016 8  % Final  . Monocytes Absolute 01/04/2016 1.2* 0.2 - 0.9 K/uL Final  . Eosinophils Relative 01/04/2016 1  % Final  . Eosinophils Absolute 01/04/2016 0.1  0 - 0.7 K/uL Final  . Basophils Relative 01/04/2016 1  % Final  . Basophils Absolute 01/04/2016 0.1  0 - 0.1 K/uL Final    Assessment:  Dawn Wiley is an 81 y.o. female with metastatic breast cancer.  She presented with stage IIIC right breast cancer status post mastectomy and axillary lymph node dissection on 08/31/2014. 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 Carcinoma involved skeletal muscle and dermal lymphatics. Tumor was less than 0.5 mm from the deep margin. Thirteen of 15 lymph nodes were involved. The largest metastatic focus was 2 cm. Tumor is  triple negative (ER negative, PR negative, and HER-2/neu negative).  Androgen receptor and PDL-1 testing was negative.  Pathologic stage was T3N3aMx.  PET scan on 09/21/2014 revealed postoperative changes in the right chest with no suspicious findings or residual tumor. Bone scan on 10/11/2014 revealed no evidence of metastatic disease.  Bone density study on 10/02/2014 revealed a T score of -4.7 in the left forearm consistent with osteoporosis. T-score was-3.6 in the right femur.   Echo on 10/02/2014 revealed ejection fraction of 60-65%.  Echo on 06/27/2015 revealed EF of 55-60%.  Echo on 08/31/2015 revealed EF of 55%.  Echo on 12/05/2015 revealed an EF of 55-60%.  She has B12 deficiency. She began B12 on 10/12/2014 (last 11/30/2015).  Folate was normal.  Her diet is modest. She has never had a colonoscopy. She denies any melena or hematochezia.  She takes 1 iron pill a day (additional iron causes diarrhea).  Ferritin was 116 on 04/25/2015.  Chest wall biopsy x 3 (lateral, medial, and middle sites) on 11/08/2014 revealed dermal lymphatic involvement by invasive mammary carcinoma with micropapillary features.  Right arm punch biopsy on 06/28/2015 confirmed breast cancer.  She has aggressive disease with growth despite several chemotherapeutic agents.  She received 5 weeks of Taxol (11/03/2014 - 12/01/2014).  She received 1 week of adriamycin (12/14/2014).  She received 1 cycle of carboplatin (12/28/2014).  After carboplatin, disease appeared to stabilize, but with 2 weeks off of therapy, her disease grew again (medial lesion larger and weaping).   She received 3 cycles of carboplatin and Taxol (01/18/2015 - 03/02/2015).  Chest wall lesions initially decreased then began to grow.  She received 2 cycles of Adriamycin and Cytoxan (03/26/2015 - 04/11/2015) with Neulasta support.  Chest wall disease began to grow before cycle #3 could be administered.  PET scan on 04/26/2015 revealed extensive  recurrence about the right chest.  The far lateral component measured 4.2 x 2.2 cm (S.U.V.13.5).  A portion superficial to the right-side of the sternum measured 5.2 x 2.2 cm (S.U.V. 9.1).  There was diffuse more inferior and lateral chest wall soft tissue thickening and hypermetabolism.    CA27.29 was 118.4 on 05/02/2015, 92.9 on 06/11/2015, 38.5 on 07/03/2015, 27.4 on 07/24/2015, and 169.6 on 12/07/2015.  She received 5 weeks of concurrent carboplatin and radiation (05/14/2015 - 06/11/2015).  Radiation completed on 06/15/2015.  Cutaneous lesions in the field of radiation improved dramatically.    [facility] testing on 05/22/2015 returned genomic alterations of ERBB2 (S310F and V777L), FGFR1 (amplification), CDH1 (E490*), CDKN2A (p16INK4a H83Y and p14ARF A97V), MYST3 (amplification), TERT (promotor -124C>T), TP53  (Y220C), and ZNF703 (amplification).  FDA approved therapies included ado-trastuzumab, lapatinib, pertuzumab, trastuzumab for ERBB2 as well as afatinib (approved in other tumor type) and pazopanib and ponatinib (approved in other tumor types for FGFR1).  She received 8 weeks of Herceptin (07/03/2015 - 08/21/2015).  Lesions continued to progress.  PET scan on 08/31/2015 revealed the interval development of multifocal hypermetabolic skin thickening and masses, representing new cutaneous metastases. Interval resolution of superior right anterior chest wall subcutaneous lesion.  There was interval development of multiple hypermetabolic left axillary lymph nodes compatible with metastasis. There was interval resolution of right axillary hypermetabolic lymph nodes. Bone scan on 08/31/2015 revealed no evidence of metastatic disease.    She received neratinib and Faslodex (began 09/07/2015) on a [facility] clinical trial.  She initially had a dramatic response.  She then developed rapid growth.  Weekly Herceptin was added on 10/26/2015.   Chest, abdomen, and pelvic CT scan at [facility] of 11/01/2015  revealed interval increase in nodular skin thickening overlying the left anterior chest/upper abdomen. There was decreased left axillary adenopathy. There was interval development of anterior right middle and right upper lobe groundglass and consolidative opacities. There was circumferential wall thickening of the ascending colon.  There was a new subcentimeter liver lesion too small to characterize.  Bone scan on 11/01/2015 at [facility] revealed no evidence of metastatic disease.     She received 3 weeks of Herceptin and Navelbine (11/30/2015 - 12/14/2015).  She has normocytic anemia likely due multiple etiologies (chronic disease and chemotherapy induced).  Labs on 12/07/2015 revealed iron deficiency with a ferritin of 37 and an iron saturation of 4%.  Folate was normal.  She is only able to tolerate 1 iron pill/day.  She was transfused 1 unit PRBCs on 12/21/2015.  Symptomatically, she feels a little better after her transfusion. Chest wall lesions are prominent and bleed easily.  She is losing weight.  She is taking hydrocodone 7.5/acetaminophen 325 for pain.   Plan: 1.  Labs today:  CBC with diff. 2.  Discuss patient's thoughts about therapy.  Discuss Xeloda and Tykerb.  Await assistance for Xeloda.  Discuss consideration of concurrent palliative radiation with lower dose Xeloda. 3.  Consult Dr. Baruch Gouty.  The patient was seen by Dr. Baruch Gouty today. He did not feel radiation would be successful given the large area of involvement. 4.  Herceptin today. 5.  Discuss iron deficiency.  Discuss Venofer weekly to increase iron stores and avoid transfusion.  Patient considering. 6.  Continue monthly B12. 7.  Discuss caloric intake.  Consider appetite stimulant. 8.  Anticipate restaging studies with initiation of Xeloda and Tykerb. 9.  RTC in 1 week for MD assessment, labs (CBC with diff, CMP, CA27.29), medical photographs, and initiation of Tykerba and Xeloda.   [name], MD  01/04/2016, 9:06 AM

## 2016-01-04 NOTE — Telephone Encounter (Signed)
I called rhonda yest and spoke to her and she will call her mom and have her come in am for herceptin

## 2016-01-06 ENCOUNTER — Other Ambulatory Visit: Payer: Self-pay | Admitting: Hematology and Oncology

## 2016-01-06 ENCOUNTER — Encounter: Payer: Self-pay | Admitting: Hematology and Oncology

## 2016-01-06 DIAGNOSIS — R634 Abnormal weight loss: Secondary | ICD-10-CM | POA: Insufficient documentation

## 2016-01-08 ENCOUNTER — Telehealth: Payer: Self-pay | Admitting: *Deleted

## 2016-01-08 NOTE — Telephone Encounter (Signed)
Called pt and she does have med and she has not opened it but it can only be xeloda. I just sent the copay asst application yest. To  Help with tykerb.  The asst for xeloda was approved on Friday and she told [name] the daughter of pt it will be shipped soon.  I have told pt that she is coming Friday to get herceptin and we will go over the drugs and side effects at that time.  Dr. Mike Gip will tell her how to take xeloda when she sees her and pt will bring the box with her to the appt.  She has not opened the box yet. I have changed her Friday appt to am because pt does not like aftenoon appts

## 2016-01-08 NOTE — Telephone Encounter (Signed)
Stated she wanted a return call form Dr Mike Gip regarding her med that came in. I returned call to find out if her Xeloda or Tykerb came in,but got VM. Left message to return my call

## 2016-01-09 ENCOUNTER — Other Ambulatory Visit: Payer: Self-pay | Admitting: Hematology and Oncology

## 2016-01-09 ENCOUNTER — Other Ambulatory Visit: Payer: Self-pay | Admitting: *Deleted

## 2016-01-09 DIAGNOSIS — C50919 Malignant neoplasm of unspecified site of unspecified female breast: Secondary | ICD-10-CM

## 2016-01-09 NOTE — Progress Notes (Signed)
Patient on plan of care prior to pathways. 

## 2016-01-09 NOTE — Progress Notes (Signed)
START OFF PATHWAY REGIMEN - Breast  Off Pathway: Capecitabine + Lapatinib  [id number]:Capecitabine + Lapatinib:   A cycle is every 21 days:     Lapatinib (Tykerb(R)) 1,250 mg orally once daily, at least one hour before or one hour after a meal. Dose Mod: None     Capecitabine (Xeloda(R)) 1,000 mg/m2 orally twice daily days 1 through 14, followed by 7 days off. (Total daily dose is 2,000 mg/m2/day). Repeat every 21 days. Dose Mod: None Additional Orders: *NOTE: May consider antiemetics, as approprirate.* LVEF monitoring recommended at baseline and q3-4 months during therapy. Additional lab monitoring includes: CBC, SCr, LFTs, electrolytes, consider ECG. Avoid concommitant strong CYP3A4 inhibitors/inducers, see lapatinib prescribing information for  detailed recommendations.  **Always confirm dose/schedule in your pharmacy ordering system**    Patient Characteristics: Metastatic Chemotherapy, HER2/neu Positive, ER -Verita Schneiders, [facility] and Beyond AJCC Stage Grouping: IV Current Disease Status: Distant Metastases AJCC M Stage: X ER Status: Negative (-) AJCC N Stage: X AJCC T Stage: X HER2/neu: Positive (+) PR Status: Negative (-) Line of therapy: Fourth Line and Beyond Would you be surprised if this patient died  in the next year? I would NOT be surprised if this patient died in the next year  Intent of Therapy: Non-Curative / Palliative Intent, Discussed with Patient

## 2016-01-10 ENCOUNTER — Ambulatory Visit: Payer: PPO | Admitting: Surgery

## 2016-01-10 NOTE — Telephone Encounter (Signed)
Mailed unread message to patient.  

## 2016-01-11 ENCOUNTER — Inpatient Hospital Stay: Payer: PPO

## 2016-01-11 ENCOUNTER — Other Ambulatory Visit: Payer: Self-pay | Admitting: *Deleted

## 2016-01-11 ENCOUNTER — Other Ambulatory Visit: Payer: Self-pay

## 2016-01-11 ENCOUNTER — Encounter: Payer: Self-pay | Admitting: Hematology and Oncology

## 2016-01-11 ENCOUNTER — Inpatient Hospital Stay (HOSPITAL_BASED_OUTPATIENT_CLINIC_OR_DEPARTMENT_OTHER): Payer: PPO | Admitting: Hematology and Oncology

## 2016-01-11 ENCOUNTER — Encounter: Payer: PPO | Attending: Surgery | Admitting: Surgery

## 2016-01-11 ENCOUNTER — Other Ambulatory Visit: Payer: Self-pay | Admitting: Hematology and Oncology

## 2016-01-11 VITALS — BP 111/66 | HR 112 | Temp 99.4°F | Resp 18 | Wt 105.2 lb

## 2016-01-11 DIAGNOSIS — E538 Deficiency of other specified B group vitamins: Secondary | ICD-10-CM

## 2016-01-11 DIAGNOSIS — C792 Secondary malignant neoplasm of skin: Secondary | ICD-10-CM

## 2016-01-11 DIAGNOSIS — M818 Other osteoporosis without current pathological fracture: Secondary | ICD-10-CM

## 2016-01-11 DIAGNOSIS — E119 Type 2 diabetes mellitus without complications: Secondary | ICD-10-CM

## 2016-01-11 DIAGNOSIS — X58XXXA Exposure to other specified factors, initial encounter: Secondary | ICD-10-CM | POA: Insufficient documentation

## 2016-01-11 DIAGNOSIS — R590 Localized enlarged lymph nodes: Secondary | ICD-10-CM

## 2016-01-11 DIAGNOSIS — S21001A Unspecified open wound of right breast, initial encounter: Secondary | ICD-10-CM | POA: Diagnosis not present

## 2016-01-11 DIAGNOSIS — Z171 Estrogen receptor negative status [ER-]: Secondary | ICD-10-CM | POA: Diagnosis not present

## 2016-01-11 DIAGNOSIS — Z5112 Encounter for antineoplastic immunotherapy: Secondary | ICD-10-CM | POA: Diagnosis not present

## 2016-01-11 DIAGNOSIS — C50919 Malignant neoplasm of unspecified site of unspecified female breast: Secondary | ICD-10-CM

## 2016-01-11 DIAGNOSIS — C50911 Malignant neoplasm of unspecified site of right female breast: Secondary | ICD-10-CM

## 2016-01-11 DIAGNOSIS — C779 Secondary and unspecified malignant neoplasm of lymph node, unspecified: Secondary | ICD-10-CM

## 2016-01-11 DIAGNOSIS — L03323 Acute lymphangitis of chest wall: Secondary | ICD-10-CM | POA: Diagnosis not present

## 2016-01-11 DIAGNOSIS — Z853 Personal history of malignant neoplasm of breast: Secondary | ICD-10-CM | POA: Insufficient documentation

## 2016-01-11 DIAGNOSIS — IMO0001 Reserved for inherently not codable concepts without codable children: Secondary | ICD-10-CM

## 2016-01-11 DIAGNOSIS — Z79899 Other long term (current) drug therapy: Secondary | ICD-10-CM | POA: Insufficient documentation

## 2016-01-11 DIAGNOSIS — I2510 Atherosclerotic heart disease of native coronary artery without angina pectoris: Secondary | ICD-10-CM | POA: Diagnosis not present

## 2016-01-11 DIAGNOSIS — D649 Anemia, unspecified: Secondary | ICD-10-CM

## 2016-01-11 DIAGNOSIS — Z923 Personal history of irradiation: Secondary | ICD-10-CM

## 2016-01-11 DIAGNOSIS — M549 Dorsalgia, unspecified: Secondary | ICD-10-CM

## 2016-01-11 DIAGNOSIS — E785 Hyperlipidemia, unspecified: Secondary | ICD-10-CM

## 2016-01-11 DIAGNOSIS — R630 Anorexia: Secondary | ICD-10-CM

## 2016-01-11 DIAGNOSIS — R5383 Other fatigue: Secondary | ICD-10-CM

## 2016-01-11 DIAGNOSIS — Z7982 Long term (current) use of aspirin: Secondary | ICD-10-CM | POA: Diagnosis not present

## 2016-01-11 DIAGNOSIS — S60219A Contusion of unspecified wrist, initial encounter: Secondary | ICD-10-CM

## 2016-01-11 DIAGNOSIS — K769 Liver disease, unspecified: Secondary | ICD-10-CM

## 2016-01-11 DIAGNOSIS — R000 Tachycardia, unspecified: Secondary | ICD-10-CM

## 2016-01-11 DIAGNOSIS — M129 Arthropathy, unspecified: Secondary | ICD-10-CM

## 2016-01-11 DIAGNOSIS — Z87442 Personal history of urinary calculi: Secondary | ICD-10-CM

## 2016-01-11 DIAGNOSIS — R918 Other nonspecific abnormal finding of lung field: Secondary | ICD-10-CM

## 2016-01-11 DIAGNOSIS — D509 Iron deficiency anemia, unspecified: Secondary | ICD-10-CM

## 2016-01-11 DIAGNOSIS — I10 Essential (primary) hypertension: Secondary | ICD-10-CM | POA: Insufficient documentation

## 2016-01-11 DIAGNOSIS — R21 Rash and other nonspecific skin eruption: Secondary | ICD-10-CM

## 2016-01-11 DIAGNOSIS — G893 Neoplasm related pain (acute) (chronic): Secondary | ICD-10-CM

## 2016-01-11 DIAGNOSIS — Z9011 Acquired absence of right breast and nipple: Secondary | ICD-10-CM

## 2016-01-11 LAB — COMPREHENSIVE METABOLIC PANEL
ALT: 10 U/L — ABNORMAL LOW (ref 14–54)
AST: 25 U/L (ref 15–41)
Albumin: 3.1 g/dL — ABNORMAL LOW (ref 3.5–5.0)
Alkaline Phosphatase: 54 U/L (ref 38–126)
Anion gap: 8 (ref 5–15)
BUN: 15 mg/dL (ref 6–20)
CO2: 24 mmol/L (ref 22–32)
Calcium: 8.5 mg/dL — ABNORMAL LOW (ref 8.9–10.3)
Chloride: 102 mmol/L (ref 101–111)
Creatinine, Ser: 0.57 mg/dL (ref 0.44–1.00)
GFR calc Af Amer: >60 mL/min (ref >60)
GFR calc non Af Amer: >60 mL/min (ref >60)
Glucose, Bld: 204 mg/dL — ABNORMAL HIGH (ref 65–99)
Potassium: 3.6 mmol/L (ref 3.5–5.1)
Sodium: 134 mmol/L — ABNORMAL LOW (ref 135–145)
Total Bilirubin: 0.7 mg/dL (ref 0.3–1.2)
Total Protein: 6.1 g/dL — ABNORMAL LOW (ref 6.5–8.1)

## 2016-01-11 LAB — CBC WITH DIFFERENTIAL/PLATELET
Basophils Absolute: 0 10*3/uL (ref 0–0.1)
Basophils Relative: 0 %
Eosinophils Absolute: 0 10*3/uL (ref 0–0.7)
Eosinophils Relative: 0 %
HCT: 25.5 % — ABNORMAL LOW (ref 35.0–47.0)
Hemoglobin: 8.3 g/dL — ABNORMAL LOW (ref 12.0–16.0)
Lymphocytes Relative: 10 %
Lymphs Abs: 1.3 10*3/uL (ref 1.0–3.6)
MCH: 28 pg (ref 26.0–34.0)
MCHC: 32.5 g/dL (ref 32.0–36.0)
MCV: 86.1 fL (ref 80.0–100.0)
Monocytes Absolute: 1.4 10*3/uL — ABNORMAL HIGH (ref 0.2–0.9)
Monocytes Relative: 11 %
Neutro Abs: 10.4 10*3/uL — ABNORMAL HIGH (ref 1.4–6.5)
Neutrophils Relative %: 79 %
Platelets: 219 10*3/uL (ref 150–440)
RBC: 2.97 MIL/uL — ABNORMAL LOW (ref 3.80–5.20)
RDW: 18.8 % — ABNORMAL HIGH (ref 11.5–14.5)
WBC: 13.1 10*3/uL — ABNORMAL HIGH (ref 3.6–11.0)

## 2016-01-11 LAB — SAMPLE TO BLOOD BANK

## 2016-01-11 MED ORDER — CYANOCOBALAMIN 1000 MCG/ML IJ SOLN
1000.0000 ug | Freq: Once | INTRAMUSCULAR | Status: AC
  Administered 2016-01-11: 1000 ug via INTRAMUSCULAR

## 2016-01-11 MED ORDER — SODIUM CHLORIDE 0.9% FLUSH
10.0000 mL | Freq: Once | INTRAVENOUS | Status: AC
  Administered 2016-01-11: 10 mL via INTRAVENOUS
  Filled 2016-01-11: qty 10

## 2016-01-11 MED ORDER — HEPARIN SOD (PORK) LOCK FLUSH 100 UNIT/ML IV SOLN
500.0000 [IU] | Freq: Once | INTRAVENOUS | Status: AC
  Administered 2016-01-11: 500 [IU] via INTRAVENOUS

## 2016-01-11 MED ORDER — HEPARIN SOD (PORK) LOCK FLUSH 100 UNIT/ML IV SOLN
INTRAVENOUS | Status: AC
  Filled 2016-01-11: qty 5

## 2016-01-11 NOTE — Progress Notes (Signed)
Patient states she fell on Wednesday and hurt her back.  Also states yesterday when she was changing her dressings on her chest she had an area about the size of a pin head that bled quite a bit.  She has not felt good for the past few days.  HR elevated today 112.  Patient states she has not taken her medication this morning.  Patient also had low grade temp of 99.4.

## 2016-01-11 NOTE — Progress Notes (Signed)
Pt here and she had rcvd her xeloda 500mg  tablets.  Dr. Mike Gip wants her to start it today even though we do not have tykerb yet and she will start on 2 tablets bid for 14 days and 1 week off.  Pt rcvd medication from [employer] through a grant to pay for med.  She was educated on side effects of drug: lowering of wbc, plt, hgb, diarrhea, hand and foot syndrome, nausea, fatigue rash, itching, mouth sores.  Pt understands to take tablets after she eats within 30 min. Of a meal 12 hours apart to take meds.  She also understands to take them whole and do not break or crush the tablet. Pt states that she does not have problems swallowing. Pt and her daughter was in the room when I went over instructions and I did ask questions to the patient and she verbally told me the instructions on how to take med.

## 2016-01-11 NOTE — Progress Notes (Signed)
[facility] day:  01/11/16   Chief Complaint: Dawn Wiley is an 81 y.o. female with stage IV right breast cancer who is seen for assessment prior to initiation of Xeloda and Tykerb.  HPI: The patient was last seen in the medical oncology clinic on 009/11/2015.  At that time, she felt a little better after her transfusion. we discussed Venofer weekly to increase iron stores and avoid transfusion.  She was considering.  Chest wall lesions were prominent and bleed easily.  She was losing weight.  We discussed her caloric intake and an appetite stimulant.  She was considering.  We discussed her thoughts about therapy.  She was interested in trying Xeloda and Tykerb.  We discussed consideration of concurrent palliative radiation with lower dose Xeloda.  She was seen by Dr. Baruch Gouty.  He did not feel radiation would be successful given the large area of involvement.  She received Herceptin.  Symptomatically, lesions continue to grow and bleed.  She notes lesions bleeding a lot yesterday and difficulty with wound care.  She has an appointment with wound care today.  She notes hip and back pain.  She fell and bruised her wrist.   Past Medical History:  Diagnosis Date  . Anemia   . Arthritis   . Breast cancer ([place])   . Cancer [facility])    breast (right)  . Coronary artery disease    Coronary calcifications noted on CT scan  . Diabetes mellitus without complication ([place])   . Dysrhythmia    TACHYCARDIA-METOPROLOL CONTROLS THIS WELL  . Headache    H/O MIGRAINES  . Hyperlipidemia   . Hypertension   . Kidney stones   . Nephrolithiasis   . PONV (postoperative nausea and vomiting)   . Rash    back  . Tachycardia    Dr. Fletcher Anon, cardiologist    Past Surgical History:  Procedure Laterality Date  . BREAST SURGERY     breast biopsy X 3  . CATARACT EXTRACTION     bilateral  . EYE SURGERY     bilateral cataract extraction  . MASTECTOMY MODIFIED  RADICAL Right 08/31/2014   Procedure: MASTECTOMY MODIFIED RADICAL;  Surgeon: Molly Maduro, MD;  Location: [facility] ORS;  Service: General;  Laterality: Right;  . PORT-A-CATH REMOVAL N/A 10/08/2015   Procedure: REMOVAL PORT-A-CATH;  Surgeon: Clayburn Pert, MD;  Location: [facility] ORS;  Service: General;  Laterality: N/A;  . PORTACATH PLACEMENT Left 10/20/2014   Procedure: INSERTION PORT-A-CATH;  Surgeon: Marlyce Huge, MD;  Location: [facility] ORS;  Service: General;  Laterality: Left;  . PORTACATH PLACEMENT Left 12/17/2015   Procedure: INSERTION PORT-A-CATH;  Surgeon: Jules Husbands, MD;  Location: [facility] ORS;  Service: General;  Laterality: Left;  . TEMPOROMANDIBULAR JOINT SURGERY      Family History  Problem Relation Age of Onset  . Peripheral Artery Disease Sister     carotid artery stenosis   . Heart disease Sister   . Diabetes Sister     Social History:  reports that she has never smoked. She has never used smokeless tobacco. She reports that she does not drink alcohol or use drugs.  The patient is accompanied by her daughter, [name], today.  Her daughter's phone number is [phone].    Allergies: No Known Allergies  Current Medications: Current Outpatient Prescriptions  Medication Sig Dispense Refill  . acetaminophen (TYLENOL) 500 MG tablet Take 500 mg by mouth every 6 (six) hours as needed for mild pain or  moderate pain.     [name] Kitchen amLODipine (NORVASC) 5 MG tablet Take 1 tablet (5 mg total) by mouth daily. (Patient taking differently: Take 5 mg by mouth every morning. ) 90 tablet 1  . aspirin 81 MG tablet Take 81 mg by mouth every other day.    . Calcium Carbonate (CALTRATE 600 PO) Take 1,200 mg by mouth daily.     . capecitabine (XELODA) 500 MG tablet Take 3 tablets (1,500 mg total) by mouth 2 (two) times daily after a meal. for 14 days then 7 days off. 84 tablet 0  . cholecalciferol (VITAMIN D) 400 UNITS TABS tablet Take 800 Units by mouth.    . Cyanocobalamin (VITAMIN B 12 PO) Take 1  tablet by mouth every morning.    . Cyanocobalamin (VITAMIN B-12 IJ) Inject as directed every 30 (thirty) days.     . diphenhydrAMINE (BENADRYL) 25 mg capsule Take 25 mg by mouth daily.     . ferrous sulfate 325 (65 FE) MG tablet Take 325 mg by mouth daily.     [name] Kitchen HYDROcodone-acetaminophen (NORCO) 7.5-325 MG tablet Take 1 tablet by mouth every 6 (six) hours as needed for moderate pain. 60 tablet 0  . hydrOXYzine (ATARAX/VISTARIL) 25 MG tablet take 1/2 to 1 tablet by mouth every 6 hours if needed for itching 30 tablet 0  . lapatinib (TYKERB) 250 MG tablet Take 5 tablets (1,250 mg total) by mouth daily. 150 tablet 0  . lidocaine-prilocaine (EMLA) cream Apply cream 1 hour before chemotherapy treatment, cover cream with saran wrap to protect clothing 30 g 1  . metoprolol tartrate (LOPRESSOR) 25 MG tablet take 1 tablet by mouth twice a day 180 tablet 2  . ondansetron (ZOFRAN) 4 MG tablet Take 1 tablet (4 mg total) by mouth every 6 (six) hours as needed for nausea or vomiting. 30 tablet 0  . potassium chloride (K-DUR,KLOR-CON) 10 MEQ tablet Take 1 tablet (10 mEq total) by mouth daily. 30 tablet 2  . temazepam (RESTORIL) 15 MG capsule 1 tablet at night as needed for sleep 30 capsule 1   No current facility-administered medications for this visit.    Facility-Administered Medications Ordered in Other Visits  Medication Dose Route Frequency Provider Last Rate Last Dose  . sodium chloride 0.9 % injection 10 mL  10 mL Intravenous PRN [name], MD      . sodium chloride 0.9 % injection 10 mL  10 mL Intravenous PRN [name], MD   10 mL at 03/21/15 0849  . sodium chloride 0.9 % injection 10 mL  10 mL Intracatheter PRN [name], MD   10 mL at 04/25/15 0834  . sodium chloride flush (NS) 0.9 % injection 10 mL  10 mL Intravenous PRN [name], MD   10 mL at 09/18/15 1423    Review of Systems:  GENERAL: Fatigue. No fevers or sweats.Weight up 1 pound. PERFORMANCE STATUS  (ECOG): 2 HEENT: No visual changes, runny nose, sore throat, or tenderness. Lungs: No shortness of breath or cough. No hemoptysis. Cardiac: No chest pain, palpitations, orthopnea, or PND. GI: No nausea, vomiting, diarrhea, constipation, melena or hematochezia. GU: No urgency, frequency, dysuria, or hematuria. Musculoskeletal: Bback pain. Hip gave out. No muscle tenderness. Extremities: No pain or swelling. Skin: Pruritic lesions.  Some chest wall lesions bleed significantly after taking off dressing. Neuro: No headache, numbness or weakness, balance or coordination issues. Endocrine: Diabetes.  No thyroid issues, hot flashes or night sweats. Psych: No mood  changes.  Trouble sleeping. Pain: Back pain 4 out of 10. Review of systems: All other systems reviewed and found to be negative.  Physical Exam: Blood pressure 111/66, pulse (!) 112, temperature 99.4 F (37.4 C), temperature source Tympanic, resp. rate 18, weight 105 lb 2.6 oz (47.7 kg). GENERAL: Thin chronically ill appearing elderly woman sitting comfortably in the exam room in no acute distress. MENTAL STATUS: Alert and oriented to person, place and time. HEAD: Short gray hair. Normocephalic, atraumatic, face symmetric, no Cushingoid features. EYES: Blue eyes. Pupils equal round and reactive to light and accomodation.  No conjunctivitis or scleral icterus. RESPIRATORY:  Clear to auscultation without rales, wheezes or rhonchi. CARDIOVASCULAR:  Regular rate and rhythm without murmur, rub or gallop. BREAST: Right chest wall and arm bandaged with  Some dried blood visible through dressing.  Peeling back dressing causes bleeding.  Dressing to be removed at wound care. ABDOMEN:  Soft, non-tender, with active bowel sounds, and no hepatosplenomegaly.  No masses. SKIN: Chest wall lesions and right upper arm lesion. EXTREMITIES: No edema, no skin discoloration or tenderness. No palpable cords. LYMPH NODES: No palpable  axillary adenopathy.  Right supraclavicular node. NEUROLOGICAL: Unremarkable. PSYCH: Appropriate.    Orders Only on 01/11/2016  Component Date Value Ref Range Status  . WBC 01/11/2016 13.1* 3.6 - 11.0 K/uL Final  . RBC 01/11/2016 2.97* 3.80 - 5.20 MIL/uL Final  . Hemoglobin 01/11/2016 8.3* 12.0 - 16.0 g/dL Final  . HCT 01/11/2016 25.5* 35.0 - 47.0 % Final  . MCV 01/11/2016 86.1  80.0 - 100.0 fL Final  . MCH 01/11/2016 28.0  26.0 - 34.0 pg Final  . MCHC 01/11/2016 32.5  32.0 - 36.0 g/dL Final  . RDW 01/11/2016 18.8* 11.5 - 14.5 % Final  . Platelets 01/11/2016 219  150 - 440 K/uL Final  . Neutrophils Relative % 01/11/2016 79  % Final  . Neutro Abs 01/11/2016 10.4* 1.4 - 6.5 K/uL Final  . Lymphocytes Relative 01/11/2016 10  % Final  . Lymphs Abs 01/11/2016 1.3  1.0 - 3.6 K/uL Final  . Monocytes Relative 01/11/2016 11  % Final  . Monocytes Absolute 01/11/2016 1.4* 0.2 - 0.9 K/uL Final  . Eosinophils Relative 01/11/2016 0  % Final  . Eosinophils Absolute 01/11/2016 0.0  0 - 0.7 K/uL Final  . Basophils Relative 01/11/2016 0  % Final  . Basophils Absolute 01/11/2016 0.0  0 - 0.1 K/uL Final  . Sodium 01/11/2016 134* 135 - 145 mmol/L Final  . Potassium 01/11/2016 3.6  3.5 - 5.1 mmol/L Final  . Chloride 01/11/2016 102  101 - 111 mmol/L Final  . CO2 01/11/2016 24  22 - 32 mmol/L Final  . Glucose, Bld 01/11/2016 204* 65 - 99 mg/dL Final  . BUN 01/11/2016 15  6 - 20 mg/dL Final  . Creatinine, Ser 01/11/2016 0.57  0.44 - 1.00 mg/dL Final  . Calcium 01/11/2016 8.5* 8.9 - 10.3 mg/dL Final  . Total Protein 01/11/2016 6.1* 6.5 - 8.1 g/dL Final  . Albumin 01/11/2016 3.1* 3.5 - 5.0 g/dL Final  . AST 01/11/2016 25  15 - 41 U/L Final  . ALT 01/11/2016 10* 14 - 54 U/L Final  . Alkaline Phosphatase 01/11/2016 54  38 - 126 U/L Final  . Total Bilirubin 01/11/2016 0.7  0.3 - 1.2 mg/dL Final  . GFR calc non Af Amer 01/11/2016 >60  >60 mL/min Final  . GFR calc Af Amer 01/11/2016 >60  >60 mL/min Final    Comment: (NOTE)  The eGFR has been calculated using the CKD EPI equation. This calculation has not been validated in all clinical situations. eGFR's persistently <60 mL/min signify possible Chronic Kidney Disease.   . Anion gap 01/11/2016 8  5 - 15 Final    Assessment:  IMUNIQUE SAMAD is an 81 y.o. female with metastatic breast cancer.  She presented with stage IIIC right breast cancer status post mastectomy and axillary lymph node dissection on 08/31/2014. 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 Carcinoma involved skeletal muscle and dermal lymphatics. Tumor was less than 0.5 mm from the deep margin. Thirteen of 15 lymph nodes were involved. The largest metastatic focus was 2 cm. Tumor is triple negative (ER negative, PR negative, and HER-2/neu negative).  Androgen receptor and PDL-1 testing was negative.  Pathologic stage was [id number].  PET scan on 09/21/2014 revealed postoperative changes in the right chest with no suspicious findings or residual tumor. Bone scan on 10/11/2014 revealed no evidence of metastatic disease.  Bone density study on 10/02/2014 revealed a T score of -4.7 in the left forearm consistent with osteoporosis. T-score was-3.6 in the right femur.   Echo on 10/02/2014 revealed ejection fraction of 60-65%.  Echo on 06/27/2015 revealed EF of 55-60%.  Echo on 08/31/2015 revealed EF of 55%.  Echo on 12/05/2015 revealed an EF of 55-60%.  She has B12 deficiency. She began B12 on 10/12/2014 (last 11/30/2015).  Folate was normal.  Her diet is modest. She has never had a colonoscopy. She denies any melena or hematochezia.  She takes 1 iron pill a day (additional iron causes diarrhea).  Ferritin was 116 on 04/25/2015.  Chest wall biopsy x 3 (lateral, medial, and middle sites) on 11/08/2014 revealed dermal lymphatic involvement by invasive mammary carcinoma with micropapillary features.  Right arm punch biopsy on  06/28/2015 confirmed breast cancer.  She has aggressive disease with growth despite several chemotherapeutic agents.  She received 5 weeks of Taxol (11/03/2014 - 12/01/2014).  She received 1 week of adriamycin (12/14/2014).  She received 1 cycle of carboplatin (12/28/2014).  After carboplatin, disease appeared to stabilize, but with 2 weeks off of therapy, her disease grew again (medial lesion larger and weaping).   She received 3 cycles of carboplatin and Taxol (01/18/2015 - 03/02/2015).  Chest wall lesions initially decreased then began to grow.  She received 2 cycles of Adriamycin and Cytoxan (03/26/2015 - 04/11/2015) with Neulasta support.  Chest wall disease began to grow before cycle #3 could be administered.  PET scan on 04/26/2015 revealed extensive recurrence about the right chest.  The far lateral component measured 4.2 x 2.2 cm (S.U.V.13.5).  A portion superficial to the right-side of the sternum measured 5.2 x 2.2 cm (S.U.V. 9.1).  There was diffuse more inferior and lateral chest wall soft tissue thickening and hypermetabolism.    CA27.29 was 118.4 on 05/02/2015, 92.9 on 06/11/2015, 38.5 on 07/03/2015, 27.4 on 07/24/2015, and 169.6 on 12/07/2015.  She received 5 weeks of concurrent carboplatin and radiation (05/14/2015 - 06/11/2015).  Radiation completed on 06/15/2015.  Cutaneous lesions in the field of radiation improved dramatically.    [facility] testing on 05/22/2015 returned genomic alterations of ERBB2 (S310F and V777L), FGFR1 (amplification), CDH1 (E490*), CDKN2A (p16INK4a H83Y and p14ARF A97V), MYST3 (amplification), TERT (promotor -124C>T), TP53  (Y220C), and ZNF703 (amplification).  FDA approved therapies included ado-trastuzumab, lapatinib, pertuzumab, trastuzumab for ERBB2 as well as afatinib (approved in other tumor type) and pazopanib and ponatinib (approved in other tumor types  for FGFR1).  She received 8 weeks of Herceptin (07/03/2015 - 08/21/2015).  She received  neratinib and Faslodex (began 09/07/2015) on a [facility] clinical trial.  She initially had a dramatic response.  She then developed rapid growth.  Weekly Herceptin was added on 10/26/2015.   PET scan on 08/31/2015 revealed the interval development of multifocal hypermetabolic skin thickening and masses, representing new cutaneous metastases. Interval resolution of superior right anterior chest wall subcutaneous lesion.  There was interval development of multiple hypermetabolic left axillary lymph nodes compatible with metastasis. There was interval resolution of right axillary hypermetabolic lymph nodes. Bone scan on 08/31/2015 revealed no evidence of metastatic disease.    Chest, abdomen, and pelvic CT scan at [facility] of 11/01/2015 revealed interval increase in nodular skin thickening overlying the left anterior chest/upper abdomen. There was decreased left axillary adenopathy. There was interval development of anterior right middle and right upper lobe groundglass and consolidative opacities. There was circumferential wall thickening of the ascending colon. There was a new subcentimeter liver lesion too small to characterize.  Bone scan on 11/01/2015 at [facility] revealed no evidence of metastatic disease.   She received 3 weeks of Herceptin and Navelbine (11/30/2015 - 12/14/2015).  She received weekly Herceptin (last 01/04/2016).  She has normocytic anemia likely due multiple etiologies (chronic disease and bleeding from open nodular breast cancer lesions).  Labs on 12/07/2015 revealed iron deficiency with a ferritin of 37 and an iron saturation of 4%.  Folate was normal.  She is only able to tolerate 1 iron pill/day.  She was transfused 1 unit PRBCs on 12/21/2015.  Symptomatically, chest wall lesions are prominent and bleed easily.  Weight is stable.  She has back pain.   Plan: 1.  Labs today:  CBC with diff, CMP, CA27.29. 2.  Discuss initiation of Xeloda 1000 mg BID.  Side effects reviewed.  Discuss advancing  slowly to full dose 1500 mg BID if tolerated.  Discuss quality of life issues.  Patient consented to treatment. 3.  Discuss addition of lapatinib when available. 4.  Discontinue Herceptin. 5.  Discuss restaging studies.  Discuss visible lesions and assessing response to therapy.  Patient interested in pursuing scans. 6.  Discuss anemia.  Discuss blood loss from breast cancer lesions.  Discuss transfusion on 01/14/2016 if needed.  Consider Venofer. 7.  B12 today and monthly. 8.  Schedule chest, abdomen, and pelvic CT scan. 9.  Follow-up with wound care today. 10.  Home health care aide to assist with dressing changes. 11.  RTC on 01/14/2016 for labs (CBC) +/- PRBC transfusion. 12.  RTC in 1 week for MD assess and labs (CBC with diff, BMP, hold tube).   [name], MD  01/11/2016, 12:08 PM

## 2016-01-12 NOTE — Progress Notes (Signed)
LATREVA, BARTNIK ([id number]) Visit Report for 01/11/2016 Chief Complaint Document Details Patient Name: Dawn Wiley, Dawn Wiley Date of Service: 01/11/2016 3:00 PM Medical Record Number: [id number] Patient Account Number: [id number] Date of Birth/Sex: 1934-12-28 (81 y.o. Female) Treating RN: Cornell Barman Primary Care Physician: Deborra Medina Other Clinician: Referring Physician: Deborra Medina Treating Physician/Extender: Frann Rider in Treatment: 5 Information Obtained from: Patient Chief Complaint Patient presents to the wound care center for a consult due non healing wound to the right chest wall and back which she's had for about 4 months now. Electronic Signature(s) Signed: 01/11/2016 3:59:43 PM By: Christin Fudge MD, FACS Entered By: Christin Fudge on 01/11/2016 15:59:43 Dawn Wiley ([id number]) -------------------------------------------------------------------------------- HPI Details Patient Name: Dawn Wiley Date of Service: 01/11/2016 3:00 PM Medical Record Number: [id number] Patient Account Number: [id number] Date of Birth/Sex: 07/08/1934 (81 y.o. Female) Treating RN: Cornell Barman Primary Care Physician: Deborra Medina Other Clinician: Referring Physician: Deborra Medina Treating Physician/Extender: Frann Rider in Treatment: 5 History of Present Illness Location: multiple areas of metastatic breast cancer over her right anterior chest wall posterior thoracic wall and axilla Quality: Patient reports experiencing a dull pain to affected area(s). Severity: Patient states wound are getting worse. Duration: Patient has had the wound for > 3 months prior to seeking treatment at the wound center Timing: Pain in wound is Intermittent (comes and goes Context: The wound appeared gradually over time Modifying Factors: Other treatment(s) tried include:Radiation therapy and chemotherapy Associated Signs and Symptoms: Patient reports having increase  discharge. HPI Description: 81 year old patient was been recently seen by her PCP Dr. Derrel Nip in early August for the chief complaints of urinary frequency, diabetes mellitus( hemoglobin A1c was 5.6%), acute recurrent cystitis, essential hypertension and metastatic skin lesions from a stage IV triple negative breast cancer. On examination she was noted to have a fungating mass covering the chest wall and a right upper arm. he is receiving palliative radiation to the painful sites of disease. Was referred to [place] for palliative care at the wound care center. in view of Dr. Kem Parkinson medical oncology note reveals -- past medical history of anemia, breast cancer, coronary artery disease, diabetes mellitus without complications, hypertension, tachycardia. status post breast surgery and biopsy o3, Right modified radical mastectomy in May 2016, Port-A-Cath placement and then removal. 10/11/2015 -- the patient and her daughter are here for review today and initially they thought that I would not examine her and just prescribe treatments. I understand that the patient has been having profuse bleeding from the tumors on her chest wall and has limited resources as far as dressing changes and home health aides. After examining the patient treating her, control of the bleeding necrotic tumor, I have had a phone conversation with Dr. Lorenda Cahill regarding the patient's care. With due respect to the management with medical oncology trials, I strongly believe that we may have reached a stage where the patient should be in inpatient hospice setting with comfort measures only. I have communicated this to Dr. Susy Manor and I have also spoken to the patient's daughter about this. Electronic Signature(s) Signed: 01/11/2016 4:03:22 PM By: Christin Fudge MD, FACS Entered By: Christin Fudge on 01/11/2016 16:03:22 Dawn Wiley  ([id number]) -------------------------------------------------------------------------------- Physical Exam Details Patient Name: Dawn Wiley Date of Service: 01/11/2016 3:00 PM Medical Record Number: [id number] Patient Account Number: [id number] Date of Birth/Sex: May 13, 1934 (81 y.o. Female) Treating RN: Cornell Barman Primary Care Physician: Deborra Medina Other Clinician: Referring Physician: Deborra Medina Treating Physician/Extender: Christin Fudge  Weeks in Treatment: 5 Constitutional . Pulse regular. Respirations normal and unlabored. Afebrile. . Eyes Nonicteric. Reactive to light. Ears, Nose, Mouth, and Throat Lips, teeth, and gums WNL.[name] Kitchen Moist mucosa without lesions. Neck supple and nontender. No palpable supraclavicular or cervical adenopathy. Normal sized without goiter. Respiratory WNL. No retractions.. Breath sounds WNL, No rubs, rales, rhonchi, or wheeze.. Cardiovascular Heart rhythm and rate regular, no murmur or gallop.. Pedal Pulses WNL. No clubbing, cyanosis or edema. Gastrointestinal (GI) Abdomen without masses or tenderness.. No liver or spleen enlargement or tenderness.. Genitourinary (GU) No hydrocele, spermatocele, tenderness of the cord, or testicular mass.[name] Kitchen Penis without lesions.Lowella Fairy without lesions. No cystocele, or rectocele. Pelvic support intact, no discharge.[name] Kitchen Urethra without masses, tenderness or scarring.[name] Kitchen Lymphatic No adneopathy. No adenopathy. No adenopathy. Musculoskeletal Adexa without tenderness or enlargement.. Digits and nails w/o clubbing, cyanosis, infection, petechiae, ischemia, or inflammatory conditions.. Integumentary (Hair, Skin) No suspicious lesions. No crepitus or fluctuance. No peri-wound warmth or erythema. No masses.[name] Kitchen Psychiatric Judgement and insight Intact.. No evidence of depression, anxiety, or agitation.. Notes the patient has had extensive metastatic disease which is fungating over the chest wall and back and  in areas there is venous oozing which is quite profuse. Using pressure and silver nitrate sticks I have painstakingly cauterized all the wounds which were bleeding excessively and have recommended addressing with Mepitel and silver alginate and ABG pads to be applied today. Electronic Signature(s) Signed: 01/11/2016 4:04:24 PM By: Christin Fudge MD, FACS Dawn Wiley ([id number]) Entered By: Christin Fudge on 01/11/2016 16:04:24 Dawn Wiley ([id number]) -------------------------------------------------------------------------------- Physician Orders Details Patient Name: Dawn Wiley Date of Service: 01/11/2016 3:00 PM Medical Record Number: [id number] Patient Account Number: [id number] Date of Birth/Sex: 25-Mar-1935 (81 y.o. Female) Treating RN: Montey Hora Primary Care Physician: Deborra Medina Other Clinician: Referring Physician: Deborra Medina Treating Physician/Extender: Frann Rider in Treatment: 5 Verbal / Phone Orders: Yes Clinician: Montey Hora Read Back and Verified: Yes Diagnosis Coding Wound Cleansing Wound #1 Right Upper Arm o Clean wound with Normal Saline. o May Shower, gently pat wound dry prior to applying new dressing. Wound #2 Midline Chest o Clean wound with Normal Saline. o May Shower, gently pat wound dry prior to applying new dressing. Wound #3 Right Back o Clean wound with Normal Saline. o May Shower, gently pat wound dry prior to applying new dressing. Primary Wound Dressing Wound #1 Right Upper Arm o Aquacel Ag o Mepitel One Wound #2 Midline Chest o Aquacel Ag o Mepitel One Wound #3 Right Back o Aquacel Ag o Mepitel One Secondary Dressing Wound #1 Right Upper Arm o ABD pad - secure with netting Wound #2 Midline Chest o ABD pad - secure with netting Wound #3 Right Back o ABD pad - secure with netting Dressing Change Frequency AHILYN, GINGRAS. ([id number]) Wound #1 Right Upper  Arm o Change dressing every day. Wound #2 Midline Chest o Change dressing every day. Wound #3 Right Back o Change dressing every day. Follow-up Appointments Wound #1 Right Upper Arm o Return Appointment in 1 month Wound #2 Midline Chest o Return Appointment in 1 month Wound #3 Right Back o Return Appointment in 1 month Electronic Signature(s) Signed: 01/11/2016 3:58:42 PM By: Montey Hora Signed: 01/11/2016 4:41:20 PM By: Christin Fudge MD, FACS Entered By: Montey Hora on 01/11/2016 15:58:42 Dawn Wiley ([id number]) -------------------------------------------------------------------------------- Problem List Details Patient Name: Dawn Wiley Date of Service: 01/11/2016 3:00 PM Medical Record Number: [id number] Patient Account Number: [id number] Date of Birth/Sex: 1934/05/23 (81  y.o. Female) Treating RN: Cornell Barman Primary Care Physician: Deborra Medina Other Clinician: Referring Physician: Deborra Medina Treating Physician/Extender: Frann Rider in Treatment: 5 Active Problems ICD-10 Encounter Code Description Active Date Diagnosis L03.323 Acute lymphangitis of chest wall 12/04/2015 Yes Z85.3 Personal history of malignant neoplasm of breast 12/04/2015 Yes S21.001A Unspecified open wound of right breast, initial encounter 12/04/2015 Yes Inactive Problems Resolved Problems Electronic Signature(s) Signed: 01/11/2016 3:59:36 PM By: Christin Fudge MD, FACS Entered By: Christin Fudge on 01/11/2016 15:59:36 Dawn Wiley ([id number]) -------------------------------------------------------------------------------- Progress Note Details Patient Name: Dawn Wiley Date of Service: 01/11/2016 3:00 PM Medical Record Number: [id number] Patient Account Number: [id number] Date of Birth/Sex: 02/14/1935 (81 y.o. Female) Treating RN: Cornell Barman Primary Care Physician: Deborra Medina Other Clinician: Referring Physician: Deborra Medina Treating  Physician/Extender: Frann Rider in Treatment: 5 Subjective Chief Complaint Information obtained from Patient Patient presents to the wound care center for a consult due non healing wound to the right chest wall and back which she's had for about 4 months now. History of Present Illness (HPI) The following HPI elements were documented for the patient's wound: Location: multiple areas of metastatic breast cancer over her right anterior chest wall posterior thoracic wall and axilla Quality: Patient reports experiencing a dull pain to affected area(s). Severity: Patient states wound are getting worse. Duration: Patient has had the wound for > 3 months prior to seeking treatment at the wound center Timing: Pain in wound is Intermittent (comes and goes Context: The wound appeared gradually over time Modifying Factors: Other treatment(s) tried include:Radiation therapy and chemotherapy Associated Signs and Symptoms: Patient reports having increase discharge. 81 year old patient was been recently seen by her PCP Dr. Derrel Nip in early August for the chief complaints of urinary frequency, diabetes mellitus( hemoglobin A1c was 5.6%), acute recurrent cystitis, essential hypertension and metastatic skin lesions from a stage IV triple negative breast cancer. On examination she was noted to have a fungating mass covering the chest wall and a right upper arm. he is receiving palliative radiation to the painful sites of disease. Was referred to [place] for palliative care at the wound care center. in view of Dr. Kem Parkinson medical oncology note reveals -- past medical history of anemia, breast cancer, coronary artery disease, diabetes mellitus without complications, hypertension, tachycardia. status post breast surgery and biopsy o3, Right modified radical mastectomy in May 2016, Port-A-Cath placement and then removal. 10/11/2015 -- the patient and her daughter are here for review today and initially  they thought that I would not examine her and just prescribe treatments. I understand that the patient has been having profuse bleeding from the tumors on her chest wall and has limited resources as far as dressing changes and home health aides. After examining the patient treating her, control of the bleeding necrotic tumor, I have had a phone conversation with Dr. Lorenda Cahill regarding the patient's care. With due respect to the management with medical oncology trials, I strongly believe that we may have reached a stage where the patient should be in inpatient hospice setting with comfort measures only. I have communicated this to Dr. Susy Manor and I have also spoken to the patient's daughter about this. CRYSTALE, PFOHL ([id number]) Objective Constitutional Pulse regular. Respirations normal and unlabored. Afebrile. Vitals Time Taken: 3:04 PM, Height: 60 in, Weight: 108 lbs, BMI: 21.1, Temperature: 98.1 F, Pulse: 95 bpm, Respiratory Rate: 18 breaths/min, Blood Pressure: 99/49 mmHg. Eyes Nonicteric. Reactive to light. Ears, Nose, Mouth, and Throat Lips, teeth, and gums  WNL.. Moist mucosa without lesions. Neck supple and nontender. No palpable supraclavicular or cervical adenopathy. Normal sized without goiter. Respiratory WNL. No retractions.. Breath sounds WNL, No rubs, rales, rhonchi, or wheeze.. Cardiovascular Heart rhythm and rate regular, no murmur or gallop.. Pedal Pulses WNL. No clubbing, cyanosis or edema. Gastrointestinal (GI) Abdomen without masses or tenderness.. No liver or spleen enlargement or tenderness.. Genitourinary (GU) No hydrocele, spermatocele, tenderness of the cord, or testicular mass.[name] Kitchen Penis without lesions.Lowella Fairy without lesions. No cystocele, or rectocele. Pelvic support intact, no discharge.[name] Kitchen Urethra without masses, tenderness or scarring.[name] Kitchen Lymphatic No adneopathy. No adenopathy. No adenopathy. Musculoskeletal Adexa without tenderness or  enlargement.. Digits and nails w/o clubbing, cyanosis, infection, petechiae, ischemia, or inflammatory conditions.[name] Kitchen Psychiatric Judgement and insight Intact.. No evidence of depression, anxiety, or agitation.. General Notes: the patient has had extensive metastatic disease which is fungating over the chest wall and back and in areas there is venous oozing which is quite profuse. Using pressure and silver nitrate sticks I have painstakingly cauterized all the wounds which were bleeding excessively and have recommended addressing with Mepitel and silver alginate and ABG pads to be applied today. AYMAN, FONSECA ([id number]) Integumentary (Hair, Skin) No suspicious lesions. No crepitus or fluctuance. No peri-wound warmth or erythema. No masses.. Wound #1 status is Open. Original cause of wound was Other Lesion. The wound is located on the Right Upper Arm. The wound measures 12cm length x 10cm width x 0.2cm depth; 94.248cm^2 area and 18.85cm^3 volume. The wound is limited to skin breakdown. There is no tunneling or undermining noted. There is a large amount of sanguinous drainage noted. The wound margin is flat and intact. There is medium (34-66%) red, pink granulation within the wound bed. There is a medium (34-66%) amount of necrotic tissue within the wound bed including Adherent Slough. The periwound skin appearance did not exhibit: Callus, Crepitus, Excoriation, Fluctuance, Friable, Induration, Localized Edema, Rash, Scarring, Dry/Scaly, Maceration, Moist, Atrophie Blanche, Cyanosis, Ecchymosis, Hemosiderin Staining, Mottled, Pallor, Rubor, Erythema. Periwound temperature was noted as No Abnormality. Wound #2 status is Open. Original cause of wound was Other Lesion. The wound is located on the Midline Chest. The wound measures 13cm length x 12cm width x 0.2cm depth; 122.522cm^2 area and 24.504cm^3 volume. The wound is limited to skin breakdown. There is no tunneling or undermining noted.  There is a medium amount of sanguinous drainage noted. The wound margin is flat and intact. There is medium (34- 66%) red, pink granulation within the wound bed. There is a medium (34-66%) amount of necrotic tissue within the wound bed including Adherent Slough. The periwound skin appearance did not exhibit: Callus, Crepitus, Excoriation, Fluctuance, Friable, Induration, Localized Edema, Rash, Scarring, Dry/Scaly, Maceration, Moist, Atrophie Blanche, Cyanosis, Ecchymosis, Hemosiderin Staining, Mottled, Pallor, Rubor, Erythema. Wound #3 status is Open. Original cause of wound was Gradually Appeared. The wound is located on the Right Back. The wound measures 17cm length x 15cm width x 0.2cm depth; 200.277cm^2 area and 40.055cm^3 volume. The wound is limited to skin breakdown. There is no tunneling or undermining noted. There is a large amount of serosanguineous drainage noted. The wound margin is flat and intact. There is small (1-33%) pink granulation within the wound bed. There is a large (67-100%) amount of necrotic tissue within the wound bed including Adherent Slough. The periwound skin appearance did not exhibit: Callus, Crepitus, Excoriation, Fluctuance, Friable, Induration, Localized Edema, Rash, Scarring, Dry/Scaly, Maceration, Moist, Atrophie Blanche, Cyanosis, Ecchymosis, Hemosiderin Staining, Mottled, Pallor, Rubor, Erythema. Periwound temperature was noted  as No Abnormality. The periwound has tenderness on palpation. Assessment Active Problems ICD-10 L03.323 - Acute lymphangitis of chest wall Z85.3 - Personal history of malignant neoplasm of breast S21.001A - Unspecified open wound of right breast, initial encounter HYDI, BATCH ([id number]) After discussing the management in detail with the patient, her daughter and with Dr. Lorenda Cahill the medical oncologist, my personal opinion is that the patient should be in inpatient hospice care with comfort measures only.  However if the patient and her family decided to enter oncology clinical trials, I will be happy to support her with palliative care as often as she can be seen at the wound center, at least at 3-4 weekly intervals. Plan Wound Cleansing: Wound #1 Right Upper Arm: Clean wound with Normal Saline. May Shower, gently pat wound dry prior to applying new dressing. Wound #2 Midline Chest: Clean wound with Normal Saline. May Shower, gently pat wound dry prior to applying new dressing. Wound #3 Right Back: Clean wound with Normal Saline. May Shower, gently pat wound dry prior to applying new dressing. Primary Wound Dressing: Wound #1 Right Upper Arm: Aquacel Ag Mepitel One Wound #2 Midline Chest: Aquacel Ag Mepitel One Wound #3 Right Back: Aquacel Ag Mepitel One Secondary Dressing: Wound #1 Right Upper Arm: ABD pad - secure with netting Wound #2 Midline Chest: ABD pad - secure with netting Wound #3 Right Back: ABD pad - secure with netting Dressing Change Frequency: Wound #1 Right Upper Arm: Change dressing every day. Wound #2 Midline Chest: Change dressing every day. Wound #3 Right Back: Change dressing every day. Follow-up Appointments: Wound #1 Right Upper Arm: Return Appointment in 1 month Wound #2 Midline Chest: HUNTLEIGH, PEKRUL ([id number]) Return Appointment in 1 month Wound #3 Right Back: Return Appointment in 1 month After discussing the management in detail with the patient, her daughter and with Dr. Lorenda Cahill the medical oncologist, my personal opinion is that the patient should be in inpatient hospice care with comfort measures only. However if the patient and her family decided to enter oncology clinical trials, I will be happy to support her with palliative care as often as she can be seen at the wound center, at least at 3-4 weekly intervals. Electronic Signature(s) Signed: 01/11/2016 4:06:54 PM By: Christin Fudge MD, FACS Entered By: Christin Fudge on  01/11/2016 16:06:53 Dawn Wiley ([id number]) -------------------------------------------------------------------------------- SuperBill Details Patient Name: Dawn Wiley Date of Service: 01/11/2016 Medical Record Number: [id number] Patient Account Number: [id number] Date of Birth/Sex: [birth year] (81 y.o. Female) Treating RN: Cornell Barman Primary Care Physician: Deborra Medina Other Clinician: Referring Physician: Deborra Medina Treating Physician/Extender: Frann Rider in Treatment: 5 Diagnosis Coding ICD-10 Codes Code Description [id number] Acute lymphangitis of chest wall Z85.3 Personal history of malignant neoplasm of breast S21.001A Unspecified open wound of right breast, initial encounter Facility Procedures CPT4 Code: [id number] Description: 99214 - WOUND CARE VISIT-LEV 4 EST PT Modifier: Quantity: 1 Physician Procedures CPT4 Code: [id number] Description: [id number] - WC PHYS LEVEL 4 - EST PT ICD-10 Description Diagnosis L03.323 Acute lymphangitis of chest wall Z85.3 Personal history of malignant neoplasm of breast S21.001A Unspecified open wound of right breast, initial Modifier: encounter Quantity: 1 Electronic Signature(s) Signed: 01/11/2016 4:10:14 PM By: Montey Hora Signed: 01/11/2016 4:41:20 PM By: Christin Fudge MD, FACS Previous Signature: 01/11/2016 4:07:08 PM Version By: Christin Fudge MD, FACS Entered By: Montey Hora on 01/11/2016 16:10:14

## 2016-01-13 LAB — CA 27.29 (SERIAL MONITOR): CA 27.29: 244.4 U/mL — ABNORMAL HIGH (ref 0.0–38.6)

## 2016-01-14 ENCOUNTER — Inpatient Hospital Stay: Payer: PPO

## 2016-01-14 ENCOUNTER — Other Ambulatory Visit: Payer: PPO

## 2016-01-14 ENCOUNTER — Other Ambulatory Visit: Payer: Self-pay | Admitting: *Deleted

## 2016-01-14 DIAGNOSIS — C50911 Malignant neoplasm of unspecified site of right female breast: Secondary | ICD-10-CM

## 2016-01-14 DIAGNOSIS — Z5112 Encounter for antineoplastic immunotherapy: Secondary | ICD-10-CM | POA: Diagnosis not present

## 2016-01-14 DIAGNOSIS — IMO0001 Reserved for inherently not codable concepts without codable children: Secondary | ICD-10-CM

## 2016-01-14 LAB — CBC WITH DIFFERENTIAL/PLATELET
Basophils Absolute: 0.1 10*3/uL (ref 0–0.1)
Basophils Relative: 1 %
Eosinophils Absolute: 0 10*3/uL (ref 0–0.7)
Eosinophils Relative: 0 %
HCT: 24.4 % — ABNORMAL LOW (ref 35.0–47.0)
Hemoglobin: 7.9 g/dL — ABNORMAL LOW (ref 12.0–16.0)
Lymphocytes Relative: 12 %
Lymphs Abs: 1.5 10*3/uL (ref 1.0–3.6)
MCH: 28 pg (ref 26.0–34.0)
MCHC: 32.4 g/dL (ref 32.0–36.0)
MCV: 86.3 fL (ref 80.0–100.0)
Monocytes Absolute: 1.2 10*3/uL — ABNORMAL HIGH (ref 0.2–0.9)
Monocytes Relative: 9 %
Neutro Abs: 9.7 10*3/uL — ABNORMAL HIGH (ref 1.4–6.5)
Neutrophils Relative %: 78 %
Platelets: 233 10*3/uL (ref 150–440)
RBC: 2.83 MIL/uL — ABNORMAL LOW (ref 3.80–5.20)
RDW: 18.6 % — ABNORMAL HIGH (ref 11.5–14.5)
WBC: 12.4 10*3/uL — ABNORMAL HIGH (ref 3.6–11.0)

## 2016-01-14 LAB — PREPARE RBC (CROSSMATCH)

## 2016-01-14 MED ORDER — DIPHENHYDRAMINE HCL 25 MG PO CAPS
25.0000 mg | ORAL_CAPSULE | Freq: Once | ORAL | Status: AC
  Administered 2016-01-14: 25 mg via ORAL
  Filled 2016-01-14: qty 1

## 2016-01-14 MED ORDER — HEPARIN SOD (PORK) LOCK FLUSH 100 UNIT/ML IV SOLN
500.0000 [IU] | Freq: Once | INTRAVENOUS | Status: AC
  Administered 2016-01-14: 500 [IU] via INTRAVENOUS
  Filled 2016-01-14: qty 5

## 2016-01-14 MED ORDER — SODIUM CHLORIDE 0.9 % IV SOLN
250.0000 mL | Freq: Once | INTRAVENOUS | Status: AC
  Administered 2016-01-14: 250 mL via INTRAVENOUS
  Filled 2016-01-14: qty 250

## 2016-01-14 MED ORDER — SODIUM CHLORIDE 0.9% FLUSH
10.0000 mL | Freq: Once | INTRAVENOUS | Status: AC
  Administered 2016-01-14: 10 mL via INTRAVENOUS
  Filled 2016-01-14: qty 10

## 2016-01-14 MED ORDER — ACETAMINOPHEN 325 MG PO TABS
650.0000 mg | ORAL_TABLET | Freq: Once | ORAL | Status: AC
  Administered 2016-01-14: 650 mg via ORAL
  Filled 2016-01-14: qty 2

## 2016-01-14 NOTE — Progress Notes (Signed)
RHAPSODY, BENKER ([id number]) Visit Report for 01/11/2016 Arrival Information Details Patient Name: Dawn Wiley, Dawn Wiley Date of Service: 01/11/2016 3:00 PM Medical Record Number: [id number] Patient Account Number: [id number] Date of Birth/Sex: December 06, 1934 (81 y.o. Female) Treating RN: Cornell Barman Primary Care Physician: Deborra Medina Other Clinician: Referring Physician: Deborra Medina Treating Physician/Extender: Frann Rider in Treatment: 5 Visit Information History Since Last Visit Added or deleted any medications: No Patient Arrived: Ambulatory Any new allergies or adverse reactions: No Arrival Time: 15:03 Had a fall or experienced change in No Accompanied By: self activities of daily living that may affect Transfer Assistance: None risk of falls: Patient Identification Verified: Yes Signs or symptoms of abuse/neglect since last No Secondary Verification Process Yes visito Completed: Hospitalized since last visit: No Patient Has Alerts: Yes Has Dressing in Place as Prescribed: Yes Patient Alerts: NO BP IN RIGHT Pain Present Now: No ARM Electronic Signature(s) Signed: 01/14/2016 10:02:59 AM By: Gretta Cool, RN, BSN, Kim RN, BSN Entered By: Gretta Cool, RN, BSN, Kim on 01/11/2016 15:04:25 Dawn Wiley ([id number]) -------------------------------------------------------------------------------- Clinic Level of Care Assessment Details Patient Name: Dawn Wiley Date of Service: 01/11/2016 3:00 PM Medical Record Number: [id number] Patient Account Number: [id number] Date of Birth/Sex: 26-Dec-1934 (81 y.o. Female) Treating RN: Montey Hora Primary Care Physician: Deborra Medina Other Clinician: Referring Physician: Deborra Medina Treating Physician/Extender: Frann Rider in Treatment: 5 Clinic Level of Care Assessment Items TOOL 4 Quantity Score []  - Use when only an EandM is performed on FOLLOW-UP visit 0 ASSESSMENTS - Nursing Assessment / Reassessment X -  Reassessment of Co-morbidities (includes updates in patient status) 1 10 X - Reassessment of Adherence to Treatment Plan 1 5 ASSESSMENTS - Wound and Skin Assessment / Reassessment []  - Simple Wound Assessment / Reassessment - one wound 0 X - Complex Wound Assessment / Reassessment - multiple wounds 3 5 []  - Dermatologic / Skin Assessment (not related to wound area) 0 ASSESSMENTS - Focused Assessment []  - Circumferential Edema Measurements - multi extremities 0 []  - Nutritional Assessment / Counseling / Intervention 0 []  - Lower Extremity Assessment (monofilament, tuning fork, pulses) 0 []  - Peripheral Arterial Disease Assessment (using hand held doppler) 0 ASSESSMENTS - Ostomy and/or Continence Assessment and Care []  - Incontinence Assessment and Management 0 []  - Ostomy Care Assessment and Management (repouching, etc.) 0 PROCESS - Coordination of Care X - Simple Patient / Family Education for ongoing care 1 15 []  - Complex (extensive) Patient / Family Education for ongoing care 0 []  - Staff obtains Programmer, systems, Records, Test Results / Process Orders 0 []  - Staff telephones HHA, Nursing Homes / Clarify orders / etc 0 []  - Routine Transfer to another Facility (non-emergent condition) 0 TYREANNA, GHAZAL ([id number]) []  - Routine Hospital Admission (non-emergent condition) 0 []  - New Admissions / Biomedical engineer / Ordering NPWT, Apligraf, etc. 0 []  - Emergency Hospital Admission (emergent condition) 0 X - Simple Discharge Coordination 1 10 []  - Complex (extensive) Discharge Coordination 0 PROCESS - Special Needs []  - Pediatric / Minor Patient Management 0 []  - Isolation Patient Management 0 []  - Hearing / Language / Visual special needs 0 []  - Assessment of Community assistance (transportation, D/C planning, etc.) 0 []  - Additional assistance / Altered mentation 0 []  - Support Surface(s) Assessment (bed, cushion, seat, etc.) 0 INTERVENTIONS - Wound Cleansing / Measurement []  -  Simple Wound Cleansing - one wound 0 X - Complex Wound Cleansing - multiple wounds 3 5 X - Wound Imaging (photographs - any  number of wounds) 1 5 []  - Wound Tracing (instead of photographs) 0 []  - Simple Wound Measurement - one wound 0 X - Complex Wound Measurement - multiple wounds 3 5 INTERVENTIONS - Wound Dressings []  - Small Wound Dressing one or multiple wounds 0 X - Medium Wound Dressing one or multiple wounds 3 15 []  - Large Wound Dressing one or multiple wounds 0 []  - Application of Medications - topical 0 []  - Application of Medications - injection 0 INTERVENTIONS - Miscellaneous []  - External ear exam 0 DIAMANTINA, MELILLO. ([id number]) []  - Specimen Collection (cultures, biopsies, blood, body fluids, etc.) 0 []  - Specimen(s) / Culture(s) sent or taken to Lab for analysis 0 []  - Patient Transfer (multiple staff / Harrel Lemon Lift / Similar devices) 0 []  - Simple Staple / Suture removal (25 or less) 0 []  - Complex Staple / Suture removal (26 or more) 0 []  - Hypo / Hyperglycemic Management (close monitor of Blood Glucose) 0 []  - Ankle / Brachial Index (ABI) - do not check if billed separately 0 X - Vital Signs 1 5 Has the patient been seen at the hospital within the last three years: Yes Total Score: 140 Level Of Care: New/Established - Level 4 Electronic Signature(s) Signed: 01/11/2016 5:02:11 PM By: Montey Hora Entered By: Montey Hora on 01/11/2016 16:10:04 Dawn Wiley ([id number]) -------------------------------------------------------------------------------- Encounter Discharge Information Details Patient Name: Dawn Wiley Date of Service: 01/11/2016 3:00 PM Medical Record Number: [id number] Patient Account Number: [id number] Date of Birth/Sex: 10-25-1934 (81 y.o. Female) Treating RN: Cornell Barman Primary Care Physician: Deborra Medina Other Clinician: Referring Physician: Deborra Medina Treating Physician/Extender: Frann Rider in Treatment:  5 Encounter Discharge Information Items Schedule Follow-up Appointment: No Medication Reconciliation completed No and provided to Patient/Care Freedom Lopezperez: Provided on Clinical Summary of Care: 01/11/2016 Form Type Recipient Paper Patient ST Electronic Signature(s) Signed: 01/11/2016 3:59:30 PM By: Ruthine Dose Entered By: Ruthine Dose on 01/11/2016 15:59:30 Dawn Wiley ([id number]) -------------------------------------------------------------------------------- Multi Wound Chart Details Patient Name: Dawn Wiley Date of Service: 01/11/2016 3:00 PM Medical Record Number: [id number] Patient Account Number: [id number] Date of Birth/Sex: 01-Aug-1934 (81 y.o. Female) Treating RN: Montey Hora Primary Care Physician: Deborra Medina Other Clinician: Referring Physician: Deborra Medina Treating Physician/Extender: Frann Rider in Treatment: 5 Vital Signs Height(in): 60 Pulse(bpm): 95 Weight(lbs): 108 Blood Pressure 99/49 (mmHg): Body Mass Index(BMI): 21 Temperature(F): 98.1 Respiratory Rate 18 (breaths/min): Photos: [1:No Photos] [2:No Photos] [3:No Photos] Wound Location: [1:Right Upper Arm] [2:Midline Chest] [3:Right Back] Wounding Event: [1:Other Lesion] [2:Other Lesion] [3:Gradually Appeared] Primary Etiology: [1:Malignant Wound] [2:Malignant Wound] [3:Malignant Wound] Comorbid History: [1:Anemia, Hypertension, Received Chemotherapy, Received Radiation] [2:Anemia, Hypertension, Received Chemotherapy, Received Radiation] [3:Anemia, Hypertension, Received Chemotherapy, Received Radiation] Date Acquired: [1:10/01/2015] [2:10/29/2015] [3:01/11/2016] Weeks of Treatment: [1:5] [2:5] [3:0] Wound Status: [1:Open] [2:Open] [3:Open] Clustered Wound: [1:No] [2:No] [3:Yes] Measurements L x W x D 12x10x0.2 [2:13x12x0.2] [3:17x15x0.2] (cm) Area (cm) : [1:94.248] [2:122.522] [3:200.277] Volume (cm) : [1:18.85] [2:24.504] [3:40.055] % Reduction in Area: [1:-190.90%]  [2:-36.80%] [3:N/A] % Reduction in Volume: -481.80% [2:-36.80%] [3:N/A] Classification: [1:Partial Thickness] [2:Partial Thickness] [3:Full Thickness Without Exposed Support Structures] Exudate Amount: [1:Large] [2:Medium] [3:Large] Exudate Type: [1:Sanguinous] [2:Sanguinous] [3:Serosanguineous] Exudate Color: [1:red] [2:red] [3:red, brown] Foul Odor After [1:No] [2:No] [3:Yes] Cleansing: Odor Anticipated Due to [1:N/A] [2:N/A] [3:No] Product Use: Wound Margin: [1:Flat and Intact] [2:Flat and Intact] [3:Flat and Intact] Granulation Amount: [1:Medium (34-66%)] [2:Medium (34-66%)] [3:Small (1-33%)] Granulation Quality: [1:Red, Pink] [2:Red, Pink] [3:Pink] Necrotic Amount: Medium (34-66%) Medium (34-66%) Large (67-100%)  Exposed Structures: Fascia: No Fascia: No Fascia: No Fat: No Fat: No Fat: No Tendon: No Tendon: No Tendon: No Muscle: No Muscle: No Muscle: No Joint: No Joint: No Joint: No Bone: No Bone: No Bone: No Limited to Skin Limited to Skin Limited to Skin Breakdown Breakdown Breakdown Epithelialization: Large (67-100%) Large (67-100%) Medium (34-66%) Periwound Skin Texture: Edema: No Edema: No Edema: No Excoriation: No Excoriation: No Excoriation: No Induration: No Induration: No Induration: No Callus: No Callus: No Callus: No Crepitus: No Crepitus: No Crepitus: No Fluctuance: No Fluctuance: No Fluctuance: No Friable: No Friable: No Friable: No Rash: No Rash: No Rash: No Scarring: No Scarring: No Scarring: No Periwound Skin Maceration: No Maceration: No Maceration: No Moisture: Moist: No Moist: No Moist: No Dry/Scaly: No Dry/Scaly: No Dry/Scaly: No Periwound Skin Color: Atrophie Blanche: No Atrophie Blanche: No Atrophie Blanche: No Cyanosis: No Cyanosis: No Cyanosis: No Ecchymosis: No Ecchymosis: No Ecchymosis: No Erythema: No Erythema: No Erythema: No Hemosiderin Staining: No Hemosiderin Staining: No Hemosiderin Staining:  No Mottled: No Mottled: No Mottled: No Pallor: No Pallor: No Pallor: No Rubor: No Rubor: No Rubor: No Temperature: No Abnormality N/A No Abnormality Tenderness on No No Yes Palpation: Wound Preparation: Ulcer Cleansing: Ulcer Cleansing: Ulcer Cleansing: Rinsed/Irrigated with Rinsed/Irrigated with Rinsed/Irrigated with Saline Saline Saline Topical Anesthetic Topical Anesthetic Topical Anesthetic Applied: None Applied: None Applied: None Treatment Notes Electronic Signature(s) Signed: 01/11/2016 3:59:19 PM By: Curtis Sites Entered By: Curtis Sites on 01/11/2016 15:59:18 Santina Evans ([id number]) -------------------------------------------------------------------------------- Multi-Disciplinary Care Plan Details Patient Name: Santina Evans Date of Service: 01/11/2016 3:00 PM Medical Record Number: [id number] Patient Account Number: [id number] Date of Birth/Sex: March 26, 1935 (81 y.o. Female) Treating RN: Curtis Sites Primary Care Physician: Duncan Dull Other Clinician: Referring Physician: Duncan Dull Treating Physician/Extender: Rudene Re in Treatment: 5 Active Inactive Malignancy/Atypical Etiology Nursing Diagnoses: Knowledge deficit related to disease process and management of malignancy Goals: Patient/caregiver will verbalize understanding of disease process and disease management of malignancy Date Initiated: 12/04/2015 Goal Status: Active Interventions: Provide education on malignant ulcerations Notes: Orientation to the Wound Care Program Nursing Diagnoses: Knowledge deficit related to the wound healing center program Goals: Patient/caregiver will verbalize understanding of the Wound Healing Center Program Date Initiated: 12/04/2015 Goal Status: Active Interventions: Provide education on orientation to the wound center Notes: Wound/Skin Impairment Nursing Diagnoses: Impaired tissue integrity Goals: Patient/caregiver will  verbalize understanding of skin care regimen Date Initiated: 12/04/2015 Santina Evans ([id number]) Goal Status: Active Interventions: Assess patient/caregiver ability to obtain necessary supplies Assess patient/caregiver ability to perform ulcer/skin care regimen upon admission and as needed Assess ulceration(s) every visit Provide education on ulcer and skin care Notes: Electronic Signature(s) Signed: 01/11/2016 3:59:07 PM By: Curtis Sites Entered By: Curtis Sites on 01/11/2016 15:59:06 Santina Evans ([id number]) -------------------------------------------------------------------------------- Pain Assessment Details Patient Name: Santina Evans Date of Service: 01/11/2016 3:00 PM Medical Record Number: [id number] Patient Account Number: [id number] Date of Birth/Sex: [birth year] (81 y.o. Female) Treating RN: Huel Coventry Primary Care Physician: Duncan Dull Other Clinician: Referring Physician: Duncan Dull Treating Physician/Extender: Rudene Re in Treatment: 5 Active Problems Location of Pain Severity and Description of Pain Patient Has Paino No Site Locations With Dressing Change: No Pain Management and Medication Current Pain Management: Notes Topical or injectable lidocaine is offered to patient for acute pain when surgical debridement is performed. If needed, Patient is instructed to use over the counter pain medication for the following 24-48 hours after debridement. Wound care MDs do not prescribed pain medications.  Patient has chronic pain or uncontrolled pain. Patient has been instructed to make an appointment with their Primary Care Physician for pain management. Electronic Signature(s) Signed: 01/14/2016 10:02:59 AM By: Gretta Cool, RN, BSN, Kim RN, BSN Entered By: Gretta Cool, RN, BSN, Kim on 01/11/2016 15:04:50 Dawn Wiley ([id number]) -------------------------------------------------------------------------------- Wound Assessment  Details Patient Name: Dawn Wiley Date of Service: 01/11/2016 3:00 PM Medical Record Number: [id number] Patient Account Number: [id number] Date of Birth/Sex: 07/12/1934 (81 y.o. Female) Treating RN: Cornell Barman Primary Care Physician: Deborra Medina Other Clinician: Referring Physician: Deborra Medina Treating Physician/Extender: Frann Rider in Treatment: 5 Wound Status Wound Number: 1 Primary Malignant Wound Etiology: Wound Location: Right Upper Arm Wound Open Wounding Event: Other Lesion Status: Date Acquired: 10/01/2015 Comorbid Anemia, Hypertension, Received Weeks Of Treatment: 5 History: Chemotherapy, Received Radiation Clustered Wound: No Wound Measurements Length: (cm) 12 Width: (cm) 10 Depth: (cm) 0.2 Area: (cm) 94.248 Volume: (cm) 18.85 % Reduction in Area: -190.9% % Reduction in Volume: -481.8% Epithelialization: Large (67-100%) Tunneling: No Undermining: No Wound Description Classification: Partial Thickness Wound Margin: Flat and Intact Exudate Amount: Large Exudate Type: Sanguinous Exudate Color: red Foul Odor After Cleansing: No Wound Bed Granulation Amount: Medium (34-66%) Exposed Structure Granulation Quality: Red, Pink Fascia Exposed: No Necrotic Amount: Medium (34-66%) Fat Layer Exposed: No Necrotic Quality: Adherent Slough Tendon Exposed: No Muscle Exposed: No Joint Exposed: No Bone Exposed: No Limited to Skin Breakdown Periwound Skin Texture Texture Color No Abnormalities Noted: No No Abnormalities Noted: No Callus: No Atrophie Blanche: No Crepitus: No Cyanosis: No Excoriation: No Ecchymosis: No Fluctuance: No Erythema: No LORALIE, SCHLERETH ([id number]) Friable: No Hemosiderin Staining: No Induration: No Mottled: No Localized Edema: No Pallor: No Rash: No Rubor: No Scarring: No Temperature / Pain Moisture Temperature: No Abnormality No Abnormalities Noted: No Dry / Scaly: No Maceration: No Moist: No Wound  Preparation Ulcer Cleansing: Rinsed/Irrigated with Saline Topical Anesthetic Applied: None Electronic Signature(s) Signed: 01/14/2016 10:02:59 AM By: Gretta Cool, RN, BSN, Kim RN, BSN Entered By: Gretta Cool, RN, BSN, Kim on 01/11/2016 15:40:19 Dawn Wiley ([id number]) -------------------------------------------------------------------------------- Wound Assessment Details Patient Name: Dawn Wiley Date of Service: 01/11/2016 3:00 PM Medical Record Number: [id number] Patient Account Number: [id number] Date of Birth/Sex: [birth year] (81 y.o. Female) Treating RN: Cornell Barman Primary Care Physician: Deborra Medina Other Clinician: Referring Physician: Deborra Medina Treating Physician/Extender: Frann Rider in Treatment: 5 Wound Status Wound Number: 2 Primary Malignant Wound Etiology: Wound Location: Midline Chest Wound Open Wounding Event: Other Lesion Status: Date Acquired: 10/29/2015 Comorbid Anemia, Hypertension, Received Weeks Of Treatment: 5 History: Chemotherapy, Received Radiation Clustered Wound: No Wound Measurements Length: (cm) 13 Width: (cm) 12 Depth: (cm) 0.2 Area: (cm) 122.522 Volume: (cm) 24.504 % Reduction in Area: -36.8% % Reduction in Volume: -36.8% Epithelialization: Large (67-100%) Tunneling: No Undermining: No Wound Description Classification: Partial Thickness Wound Margin: Flat and Intact Exudate Amount: Medium Exudate Type: Sanguinous Exudate Color: red Foul Odor After Cleansing: No Wound Bed Granulation Amount: Medium (34-66%) Exposed Structure Granulation Quality: Red, Pink Fascia Exposed: No Necrotic Amount: Medium (34-66%) Fat Layer Exposed: No Necrotic Quality: Adherent Slough Tendon Exposed: No Muscle Exposed: No Joint Exposed: No Bone Exposed: No Limited to Skin Breakdown Periwound Skin Texture Texture Color No Abnormalities Noted: No No Abnormalities Noted: No Callus: No Atrophie Blanche: No Crepitus: No Cyanosis:  No Excoriation: No Ecchymosis: No Fluctuance: No Erythema: No CHAZ, LESTER. ([id number]) Friable: No Hemosiderin Staining: No Induration: No Mottled: No Localized Edema: No Pallor: No Rash: No Rubor: No Scarring:  No Moisture No Abnormalities Noted: No Dry / Scaly: No Maceration: No Moist: No Wound Preparation Ulcer Cleansing: Rinsed/Irrigated with Saline Topical Anesthetic Applied: None Electronic Signature(s) Signed: 01/14/2016 10:02:59 AM By: Gretta Cool, RN, BSN, Kim RN, BSN Entered By: Gretta Cool, RN, BSN, Kim on 01/11/2016 15:40:20 Dawn Wiley ([id number]) -------------------------------------------------------------------------------- Wound Assessment Details Patient Name: Dawn Wiley Date of Service: 01/11/2016 3:00 PM Medical Record Number: [id number] Patient Account Number: [id number] Date of Birth/Sex: [birth year] (81 y.o. Female) Treating RN: Cornell Barman Primary Care Physician: Deborra Medina Other Clinician: Referring Physician: Deborra Medina Treating Physician/Extender: Frann Rider in Treatment: 5 Wound Status Wound Number: 3 Primary Malignant Wound Etiology: Wound Location: Right Back Wound Open Wounding Event: Gradually Appeared Status: Date Acquired: 01/11/2016 Comorbid Anemia, Hypertension, Received Weeks Of Treatment: 0 History: Chemotherapy, Received Radiation Clustered Wound: Yes Photos Photo Uploaded By: Gretta Cool, RN, BSN, Kim on 01/11/2016 16:19:03 Wound Measurements Length: (cm) 17 Width: (cm) 15 Depth: (cm) 0.2 Area: (cm) 200.277 Volume: (cm) 40.055 % Reduction in Area: % Reduction in Volume: Epithelialization: Medium (34-66%) Tunneling: No Undermining: No Wound Description Full Thickness Without Exposed Classification: Support Structures Wound Margin: Flat and Intact Exudate Large Amount: Exudate Type: Serosanguineous Exudate Color: red, brown Foul Odor After Cleansing: Yes Due to Product Use: No Wound  Bed Granulation Amount: Small (1-33%) Exposed Structure Granulation Quality: Pink Fascia Exposed: No Necrotic Amount: Large (67-100%) Fat Layer Exposed: No Necrotic Quality: Adherent Slough Tendon Exposed: No Muscle Exposed: No JELA, HOMEWOOD. ([id number]) Joint Exposed: No Bone Exposed: No Limited to Skin Breakdown Periwound Skin Texture Texture Color No Abnormalities Noted: No No Abnormalities Noted: No Callus: No Atrophie Blanche: No Crepitus: No Cyanosis: No Excoriation: No Ecchymosis: No Fluctuance: No Erythema: No Friable: No Hemosiderin Staining: No Induration: No Mottled: No Localized Edema: No Pallor: No Rash: No Rubor: No Scarring: No Temperature / Pain Moisture Temperature: No Abnormality No Abnormalities Noted: No Tenderness on Palpation: Yes Dry / Scaly: No Maceration: No Moist: No Wound Preparation Ulcer Cleansing: Rinsed/Irrigated with Saline Topical Anesthetic Applied: None Electronic Signature(s) Signed: 01/14/2016 10:02:59 AM By: Gretta Cool, RN, BSN, Kim RN, BSN Entered By: Gretta Cool, RN, BSN, Kim on 01/11/2016 15:40:20 Dawn Wiley ([id number]) -------------------------------------------------------------------------------- [place] Details Patient Name: Dawn Wiley Date of Service: 01/11/2016 3:00 PM Medical Record Number: [id number] Patient Account Number: [id number] Date of Birth/Sex: 1935/04/14 (81 y.o. Female) Treating RN: Cornell Barman Primary Care Physician: Deborra Medina Other Clinician: Referring Physician: Deborra Medina Treating Physician/Extender: Frann Rider in Treatment: 5 Vital Signs Time Taken: 15:04 Temperature (F): 98.1 Height (in): 60 Pulse (bpm): 95 Weight (lbs): 108 Respiratory Rate (breaths/min): 18 Body Mass Index (BMI): 21.1 Blood Pressure (mmHg): 99/49 Reference Range: 80 - 120 mg / dl Electronic Signature(s) Signed: 01/14/2016 10:02:59 AM By: Gretta Cool, RN, BSN, Kim RN, BSN Entered By: Gretta Cool, RN,  BSN, Kim on 01/11/2016 15:05:46

## 2016-01-15 ENCOUNTER — Other Ambulatory Visit: Payer: Self-pay | Admitting: Hematology and Oncology

## 2016-01-15 ENCOUNTER — Other Ambulatory Visit: Payer: Self-pay | Admitting: *Deleted

## 2016-01-15 DIAGNOSIS — IMO0001 Reserved for inherently not codable concepts without codable children: Secondary | ICD-10-CM

## 2016-01-15 DIAGNOSIS — D649 Anemia, unspecified: Secondary | ICD-10-CM

## 2016-01-15 DIAGNOSIS — C50911 Malignant neoplasm of unspecified site of right female breast: Secondary | ICD-10-CM

## 2016-01-15 LAB — TYPE AND SCREEN
ABO/RH(D): A POS
Antibody Screen: NEGATIVE
Unit division: 0

## 2016-01-15 NOTE — Telephone Encounter (Signed)
Mailed unread message to patient.  

## 2016-01-16 ENCOUNTER — Ambulatory Visit
Admission: RE | Admit: 2016-01-16 | Discharge: 2016-01-16 | Disposition: A | Discharge status: Home / Self Care | Payer: PPO | Source: Ambulatory Visit | Attending: Hematology and Oncology | Admitting: Hematology and Oncology

## 2016-01-16 ENCOUNTER — Other Ambulatory Visit: Payer: Self-pay | Admitting: *Deleted

## 2016-01-16 ENCOUNTER — Other Ambulatory Visit: Payer: Self-pay | Admitting: Hematology and Oncology

## 2016-01-16 ENCOUNTER — Encounter: Payer: Self-pay | Admitting: *Deleted

## 2016-01-16 DIAGNOSIS — M25551 Pain in right hip: Secondary | ICD-10-CM

## 2016-01-16 DIAGNOSIS — M25552 Pain in left hip: Principal | ICD-10-CM

## 2016-01-16 DIAGNOSIS — IMO0001 Reserved for inherently not codable concepts without codable children: Secondary | ICD-10-CM

## 2016-01-16 DIAGNOSIS — C50911 Malignant neoplasm of unspecified site of right female breast: Secondary | ICD-10-CM

## 2016-01-16 MED ORDER — TEMAZEPAM 15 MG PO CAPS: ORAL_CAPSULE | ORAL | 1 refills | Status: AC

## 2016-01-16 MED ORDER — HYDROCODONE-ACETAMINOPHEN 7.5-325 MG PO TABS: 1.0000 | ORAL_TABLET | Freq: Four times a day (QID) | ORAL | 0 refills | Status: AC | PRN

## 2016-01-16 MED ORDER — HYDROXYZINE HCL 25 MG PO TABS
ORAL_TABLET | ORAL | 0 refills | Status: AC
Start: 2016-01-16 — End: ?

## 2016-01-17 ENCOUNTER — Ambulatory Visit
Admission: RE | Admit: 2016-01-17 | Discharge: 2016-01-17 | Disposition: A | Discharge status: Home / Self Care | Payer: PPO | Source: Ambulatory Visit | Attending: Hematology and Oncology | Admitting: Hematology and Oncology

## 2016-01-17 ENCOUNTER — Encounter: Payer: PPO | Admitting: Surgery

## 2016-01-17 DIAGNOSIS — K8020 Calculus of gallbladder without cholecystitis without obstruction: Secondary | ICD-10-CM | POA: Diagnosis not present

## 2016-01-17 DIAGNOSIS — M25552 Pain in left hip: Secondary | ICD-10-CM | POA: Insufficient documentation

## 2016-01-17 DIAGNOSIS — I700 Atherosclerosis of aorta: Secondary | ICD-10-CM | POA: Diagnosis not present

## 2016-01-17 DIAGNOSIS — M4856XA Collapsed vertebra, not elsewhere classified, lumbar region, initial encounter for fracture: Secondary | ICD-10-CM | POA: Diagnosis not present

## 2016-01-17 DIAGNOSIS — L03323 Acute lymphangitis of chest wall: Secondary | ICD-10-CM | POA: Diagnosis not present

## 2016-01-17 DIAGNOSIS — M25551 Pain in right hip: Secondary | ICD-10-CM | POA: Diagnosis present

## 2016-01-17 NOTE — Progress Notes (Signed)
Pt rcvd shipment of tykerb through [employer] after she got approved for PNAF grant for $8,000.  Pt in office today to go over teaching of lapatinib-tykerb.  Pt's dose in 1250 mg daily=5 pills a day.  She was instructed to take the pill one hour before or 1 hour after she eats.  Educated on side effects-possible diarrhea, n/v, hand-foot syndrome, low rbc, low plt, inc. Liver enzymes, rash, fatigue to name majority.  Pt to start tykerb tom. Am and she will be concurrent on Xeloda that she started last week. Pt verbalized understanding of side effects and her daughter was there to get the education also. She repeated the instructions on how to take medication correctly.

## 2016-01-18 ENCOUNTER — Other Ambulatory Visit: Payer: Self-pay

## 2016-01-18 ENCOUNTER — Inpatient Hospital Stay: Payer: PPO

## 2016-01-18 ENCOUNTER — Inpatient Hospital Stay: Payer: PPO | Admitting: Hematology and Oncology

## 2016-01-18 ENCOUNTER — Telehealth: Payer: Self-pay | Admitting: *Deleted

## 2016-01-18 DIAGNOSIS — C50911 Malignant neoplasm of unspecified site of right female breast: Secondary | ICD-10-CM

## 2016-01-18 DIAGNOSIS — IMO0001 Reserved for inherently not codable concepts without codable children: Secondary | ICD-10-CM

## 2016-01-18 DIAGNOSIS — Z5112 Encounter for antineoplastic immunotherapy: Secondary | ICD-10-CM | POA: Diagnosis not present

## 2016-01-18 DIAGNOSIS — C50A1 Malignant inflammatory neoplasm of right breast: Secondary | ICD-10-CM

## 2016-01-18 DIAGNOSIS — D649 Anemia, unspecified: Secondary | ICD-10-CM

## 2016-01-18 LAB — BASIC METABOLIC PANEL
Anion gap: 7 (ref 5–15)
BUN: 21 mg/dL — ABNORMAL HIGH (ref 6–20)
CO2: 26 mmol/L (ref 22–32)
Calcium: 8.7 mg/dL — ABNORMAL LOW (ref 8.9–10.3)
Chloride: 100 mmol/L — ABNORMAL LOW (ref 101–111)
Creatinine, Ser: 0.71 mg/dL (ref 0.44–1.00)
GFR calc Af Amer: >60 mL/min (ref >60)
GFR calc non Af Amer: >60 mL/min (ref >60)
Glucose, Bld: 235 mg/dL — ABNORMAL HIGH (ref 65–99)
Potassium: 4 mmol/L (ref 3.5–5.1)
Sodium: 133 mmol/L — ABNORMAL LOW (ref 135–145)

## 2016-01-18 LAB — CBC WITH DIFFERENTIAL/PLATELET
Basophils Absolute: 0 10*3/uL (ref 0–0.1)
Basophils Relative: 0 %
Eosinophils Absolute: 0 10*3/uL (ref 0–0.7)
Eosinophils Relative: 0 %
HCT: 29.5 % — ABNORMAL LOW (ref 35.0–47.0)
Hemoglobin: 9.8 g/dL — ABNORMAL LOW (ref 12.0–16.0)
Lymphocytes Relative: 17 %
Lymphs Abs: 1.7 10*3/uL (ref 1.0–3.6)
MCH: 28.9 pg (ref 26.0–34.0)
MCHC: 33 g/dL (ref 32.0–36.0)
MCV: 87.5 fL (ref 80.0–100.0)
Monocytes Absolute: 0.8 10*3/uL (ref 0.2–0.9)
Monocytes Relative: 8 %
Neutro Abs: 7 10*3/uL — ABNORMAL HIGH (ref 1.4–6.5)
Neutrophils Relative %: 73 %
Platelets: 272 10*3/uL (ref 150–440)
RBC: 3.38 MIL/uL — ABNORMAL LOW (ref 3.80–5.20)
RDW: 17.5 % — ABNORMAL HIGH (ref 11.5–14.5)
WBC: 9.6 10*3/uL (ref 3.6–11.0)

## 2016-01-18 LAB — SAMPLE TO BLOOD BANK

## 2016-01-18 NOTE — Progress Notes (Addendum)
Dawn Wiley ([id number]) Visit Report for 01/17/2016 Arrival Information Details Patient Name: Dawn Wiley, Dawn Wiley Date of Service: 01/17/2016 3:45 PM Medical Record Number: [id number] Patient Account Number: [id number] Date of Birth/Sex: [birth year] (81 y.o. Female) Treating RN: Cornell Barman Primary Care Physician: Deborra Medina Other Clinician: Referring Physician: Deborra Medina Treating Physician/Extender: Frann Rider in Treatment: 6 Visit Information History Since Last Visit Added or deleted any medications: No Patient Arrived: Ambulatory Any new allergies or adverse reactions: No Arrival Time: 15:36 Had a fall or experienced change in No Accompanied By: daughter activities of daily living that may affect Transfer Assistance: None risk of falls: Patient Identification Verified: Yes Signs or symptoms of abuse/neglect since last No Secondary Verification Process Yes visito Completed: Hospitalized since last visit: No Patient Has Alerts: Yes Pain Present Now: No Patient Alerts: NO BP IN RIGHT ARM Electronic Signature(s) Signed: 01/18/2016 10:50:50 AM By: Gretta Cool, RN, BSN, Kim RN, BSN Entered By: Gretta Cool, RN, BSN, Kim on 01/17/2016 15:57:21 Dawn Wiley ([id number]) -------------------------------------------------------------------------------- Clinic Level of Care Assessment Details Patient Name: Dawn Wiley Date of Service: 01/17/2016 3:45 PM Medical Record Number: [id number] Patient Account Number: [id number] Date of Birth/Sex: December 27, 1934 (81 y.o. Female) Treating RN: Cornell Barman Primary Care Physician: Deborra Medina Other Clinician: Referring Physician: Deborra Medina Treating Physician/Extender: Frann Rider in Treatment: 6 Clinic Level of Care Assessment Items TOOL 4 Quantity Score []  - Use when only an EandM is performed on FOLLOW-UP visit 0 ASSESSMENTS - Nursing Assessment / Reassessment []  - Reassessment of Co-morbidities (includes  updates in patient status) 0 X - Reassessment of Adherence to Treatment Plan 1 5 ASSESSMENTS - Wound and Skin Assessment / Reassessment []  - Simple Wound Assessment / Reassessment - one wound 0 X - Complex Wound Assessment / Reassessment - multiple wounds 3 5 []  - Dermatologic / Skin Assessment (not related to wound area) 0 ASSESSMENTS - Focused Assessment []  - Circumferential Edema Measurements - multi extremities 0 []  - Nutritional Assessment / Counseling / Intervention 0 []  - Lower Extremity Assessment (monofilament, tuning fork, pulses) 0 []  - Peripheral Arterial Disease Assessment (using hand held doppler) 0 ASSESSMENTS - Ostomy and/or Continence Assessment and Care []  - Incontinence Assessment and Management 0 []  - Ostomy Care Assessment and Management (repouching, etc.) 0 PROCESS - Coordination of Care X - Simple Patient / Family Education for ongoing care 1 15 []  - Complex (extensive) Patient / Family Education for ongoing care 0 X - Staff obtains Programmer, systems, Records, Test Results / Process Orders 1 10 []  - Staff telephones HHA, Nursing Homes / Clarify orders / etc 0 []  - Routine Transfer to another Facility (non-emergent condition) 0 Dawn Wiley ([id number]) []  - Routine Hospital Admission (non-emergent condition) 0 []  - New Admissions / Biomedical engineer / Ordering NPWT, Apligraf, etc. 0 []  - Emergency Hospital Admission (emergent condition) 0 X - Simple Discharge Coordination 1 10 []  - Complex (extensive) Discharge Coordination 0 PROCESS - Special Needs []  - Pediatric / Minor Patient Management 0 []  - Isolation Patient Management 0 []  - Hearing / Language / Visual special needs 0 []  - Assessment of Community assistance (transportation, D/C planning, etc.) 0 []  - Additional assistance / Altered mentation 0 []  - Support Surface(s) Assessment (bed, cushion, seat, etc.) 0 INTERVENTIONS - Wound Cleansing / Measurement []  - Simple Wound Cleansing - one wound 0 X -  Complex Wound Cleansing - multiple wounds 3 5 X - Wound Imaging (photographs - any number of wounds) 1 5 []  -  Wound Tracing (instead of photographs) 0 []  - Simple Wound Measurement - one wound 0 X - Complex Wound Measurement - multiple wounds 3 5 INTERVENTIONS - Wound Dressings []  - Small Wound Dressing one or multiple wounds 0 []  - Medium Wound Dressing one or multiple wounds 0 X - Large Wound Dressing one or multiple wounds 3 20 []  - Application of Medications - topical 0 []  - Application of Medications - injection 0 INTERVENTIONS - Miscellaneous []  - External ear exam 0 Dawn Wiley. ([id number]) []  - Specimen Collection (cultures, biopsies, blood, body fluids, etc.) 0 []  - Specimen(s) / Culture(s) sent or taken to Lab for analysis 0 []  - Patient Transfer (multiple staff / Harrel Lemon Lift / Similar devices) 0 []  - Simple Staple / Suture removal (25 or less) 0 []  - Complex Staple / Suture removal (26 or more) 0 []  - Hypo / Hyperglycemic Management (close monitor of Blood Glucose) 0 []  - Ankle / Brachial Index (ABI) - do not check if billed separately 0 X - Vital Signs 1 5 Has the patient been seen at the hospital within the last three years: Yes Total Score: 155 Level Of Care: New/Established - Level 4 Electronic Signature(s) Signed: 01/18/2016 10:50:50 AM By: Gretta Cool, RN, BSN, Kim RN, BSN Entered By: Gretta Cool, RN, BSN, Kim on 01/17/2016 16:41:58 Dawn Wiley ([id number]) -------------------------------------------------------------------------------- Encounter Discharge Information Details Patient Name: Dawn Wiley Date of Service: 01/17/2016 3:45 PM Medical Record Number: [id number] Patient Account Number: [id number] Date of Birth/Sex: Dec 18, 1934 (81 y.o. Female) Treating RN: Cornell Barman Primary Care Physician: Deborra Medina Other Clinician: Referring Physician: Deborra Medina Treating Physician/Extender: Frann Rider in Treatment: 6 Encounter Discharge  Information Items Discharge Pain Level: 0 Discharge Condition: Stable Ambulatory Status: Ambulatory Discharge Destination: Home Transportation: Private Auto Accompanied By: daughter Schedule Follow-up Appointment: Yes Medication Reconciliation completed and provided to Patient/Care Yes Mohsin Crum: Provided on Clinical Summary of Care: 01/17/2016 Form Type Recipient Paper Patient ST Electronic Signature(s) Signed: 01/18/2016 10:50:50 AM By: Gretta Cool RN, BSN, Kim RN, BSN Previous Signature: 01/17/2016 4:38:01 PM Version By: Ruthine Dose Entered By: Gretta Cool RN, BSN, Kim on 01/17/2016 16:48:41 Dawn Wiley ([id number]) -------------------------------------------------------------------------------- Multi Wound Chart Details Patient Name: Dawn Wiley Date of Service: 01/17/2016 3:45 PM Medical Record Number: [id number] Patient Account Number: [id number] Date of Birth/Sex: 24-May-1934 (81 y.o. Female) Treating RN: Cornell Barman Primary Care Physician: Deborra Medina Other Clinician: Referring Physician: Deborra Medina Treating Physician/Extender: Frann Rider in Treatment: 6 Vital Signs Height(in): 60 Pulse(bpm): 130 Weight(lbs): 108 Blood Pressure 115/62 (mmHg): Body Mass Index(BMI): 21 Temperature(F): 97.5 Respiratory Rate 18 (breaths/min): Photos: [1:No Photos] [2:No Photos] [3:No Photos] Wound Location: [1:Right Upper Arm] [2:Midline Chest] [3:Right Back] Wounding Event: [1:Other Lesion] [2:Other Lesion] [3:Gradually Appeared] Primary Etiology: [1:Malignant Wound] [2:Malignant Wound] [3:Malignant Wound] Date Acquired: [1:10/01/2015] [2:10/29/2015] [3:01/11/2016] Weeks of Treatment: [1:6] [2:6] [3:0] Wound Status: [1:Open] [2:Open] [3:Open] Clustered Wound: [1:No] [2:No] [3:Yes] Measurements L x W x D 16x10x0.1 [2:13x12x0.1] [3:17x16x0.1] (cm) Area (cm) : [1:125.664] [2:122.522] [3:213.628] Volume (cm) : [1:12.566] [2:12.252] [3:21.363] % Reduction in Area:  [1:-287.90%] [2:-36.80%] [3:-6.70%] % Reduction in Volume: -287.80% [2:31.60%] [3:46.70%] Classification: [1:Partial Thickness] [2:Partial Thickness] [3:Full Thickness Without Exposed Support Structures] Periwound Skin Texture: No Abnormalities Noted [2:No Abnormalities Noted] [3:No Abnormalities Noted] Periwound Skin [1:No Abnormalities Noted] [2:No Abnormalities Noted] [3:No Abnormalities Noted] Moisture: Periwound Skin Color: No Abnormalities Noted [2:No Abnormalities Noted] [3:No Abnormalities Noted] Tenderness on [1:No] [2:No] [3:No] Treatment Notes Electronic Signature(s) Signed: 01/18/2016 10:50:50 AM By: Gretta Cool, RN, BSN,  Maudie Mercury RN, BSN Dombkowski, Paralee Wiley ([id number]) Entered By: Gretta Cool, RN, BSN, Kim on 01/17/2016 16:14:21 Dawn Wiley ([id number]) -------------------------------------------------------------------------------- Multi-Disciplinary Care Plan Details Patient Name: Dawn Wiley Date of Service: 01/17/2016 3:45 PM Medical Record Number: [id number] Patient Account Number: [id number] Date of Birth/Sex: [birth year] (81 y.o. Female) Treating RN: Cornell Barman Primary Care Physician: Deborra Medina Other Clinician: Referring Physician: Deborra Medina Treating Physician/Extender: Frann Rider in Treatment: 6 Active Inactive Electronic Signature(s) Signed: 02/05/2016 1:46:39 PM By: Gretta Cool RN, BSN, Kim RN, BSN Previous Signature: 01/18/2016 10:50:50 AM Version By: Gretta Cool RN, BSN, Kim RN, BSN Entered By: Gretta Cool, RN, BSN, Kim on 01/24/2016 11:18:57 Dawn Wiley ([id number]) -------------------------------------------------------------------------------- Pain Assessment Details Patient Name: Dawn Wiley Date of Service: 01/17/2016 3:45 PM Medical Record Number: [id number] Patient Account Number: [id number] Date of Birth/Sex: 30-Oct-1934 (81 y.o. Female) Treating RN: Cornell Barman Primary Care Physician: Deborra Medina Other Clinician: Referring Physician:  Deborra Medina Treating Physician/Extender: Frann Rider in Treatment: 6 Active Problems Location of Pain Severity and Description of Pain Patient Has Paino No Site Locations With Dressing Change: No Pain Management and Medication Current Pain Management: Electronic Signature(s) Signed: 01/18/2016 10:50:50 AM By: Gretta Cool, RN, BSN, Kim RN, BSN Entered By: Gretta Cool, RN, BSN, Kim on 01/17/2016 15:57:28 Dawn Wiley ([id number]) -------------------------------------------------------------------------------- Patient/Caregiver Education Details Patient Name: Dawn Wiley Date of Service: 01/17/2016 3:45 PM Medical Record Number: [id number] Patient Account Number: [id number] Date of Birth/Gender: January 21, 1935 (81 y.o. Female) Treating RN: Cornell Barman Primary Care Physician: Deborra Medina Other Clinician: Referring Physician: Deborra Medina Treating Physician/Extender: Frann Rider in Treatment: 6 Education Assessment Education Provided To: Patient Education Topics Provided Wound/Skin Impairment: Handouts: Caring for Your Ulcer Methods: Demonstration Responses: State content correctly Electronic Signature(s) Signed: 01/18/2016 10:50:50 AM By: Gretta Cool, RN, BSN, Kim RN, BSN Entered By: Gretta Cool, RN, BSN, Kim on 01/17/2016 16:48:51 Dawn Wiley ([id number]) -------------------------------------------------------------------------------- Wound Assessment Details Patient Name: Dawn Wiley Date of Service: 01/17/2016 3:45 PM Medical Record Number: [id number] Patient Account Number: [id number] Date of Birth/Sex: 09-Mar-1935 (81 y.o. Female) Treating RN: Cornell Barman Primary Care Physician: Deborra Medina Other Clinician: Referring Physician: Deborra Medina Treating Physician/Extender: Frann Rider in Treatment: 6 Wound Status Wound Number: 1 Primary Etiology: Malignant Wound Wound Location: Right Upper Arm Wound Status: Open Wounding Event: Other  Lesion Date Acquired: 10/01/2015 Weeks Of Treatment: 6 Clustered Wound: No Photos Photo Uploaded By: Gretta Cool, RN, BSN, Kim on 01/17/2016 16:49:35 Wound Measurements Length: (cm) 16 Width: (cm) 10 Depth: (cm) 0.1 Area: (cm) 125.664 Volume: (cm) 12.566 % Reduction in Area: -287.9% % Reduction in Volume: -287.8% Wound Description Classification: Partial Thickness Periwound Skin Texture Texture Color No Abnormalities Noted: No No Abnormalities Noted: No Moisture No Abnormalities Noted: No Electronic Signature(s) Signed: 01/18/2016 10:50:50 AM By: Gretta Cool, RN, BSN, Kim RN, BSN Entered By: Gretta Cool, RN, BSN, Kim on 01/17/2016 16:13:48 Dawn Wiley ([id number]) -------------------------------------------------------------------------------- Wound Assessment Details Patient Name: Dawn Wiley Date of Service: 01/17/2016 3:45 PM Medical Record Number: [id number] Patient Account Number: [id number] Date of Birth/Sex: 1935-04-20 (81 y.o. Female) Treating RN: Cornell Barman Primary Care Physician: Deborra Medina Other Clinician: Referring Physician: Deborra Medina Treating Physician/Extender: Frann Rider in Treatment: 6 Wound Status Wound Number: 2 Primary Etiology: Malignant Wound Wound Location: Midline Chest Wound Status: Open Wounding Event: Other Lesion Date Acquired: 10/29/2015 Weeks Of Treatment: 6 Clustered Wound: No Photos Photo Uploaded By: Gretta Cool, RN, BSN, Kim on 01/17/2016 16:49:35 Wound Measurements Length: (cm) 13 Width: (cm) 12 Depth: (cm) 0.1  Area: (cm) 122.522 Volume: (cm) 12.252 % Reduction in Area: -36.8% % Reduction in Volume: 31.6% Wound Description Classification: Partial Thickness Periwound Skin Texture Texture Color No Abnormalities Noted: No No Abnormalities Noted: No Moisture No Abnormalities Noted: No Electronic Signature(s) Signed: 01/18/2016 10:50:50 AM By: Gretta Cool, RN, BSN, Kim RN, BSN Entered By: Gretta Cool, RN, BSN, Kim on  01/17/2016 16:13:48 Dawn Wiley ([id number]) -------------------------------------------------------------------------------- Wound Assessment Details Patient Name: Dawn Wiley Date of Service: 01/17/2016 3:45 PM Medical Record Number: [id number] Patient Account Number: [id number] Date of Birth/Sex: 12/16/1934 (81 y.o. Female) Treating RN: Cornell Barman Primary Care Physician: Deborra Medina Other Clinician: Referring Physician: Deborra Medina Treating Physician/Extender: Frann Rider in Treatment: 6 Wound Status Wound Number: 3 Primary Etiology: Malignant Wound Wound Location: Right Back Wound Status: Open Wounding Event: Gradually Appeared Date Acquired: 01/11/2016 Weeks Of Treatment: 0 Clustered Wound: Yes Photos Photo Uploaded By: Gretta Cool, RN, BSN, Kim on 01/17/2016 16:50:01 Wound Measurements Length: (cm) 17 Width: (cm) 16 Depth: (cm) 0.1 Area: (cm) 213.628 Volume: (cm) 21.363 % Reduction in Area: -6.7% % Reduction in Volume: 46.7% Wound Description Full Thickness Without Exposed Classification: Support Structures Periwound Skin Texture Texture Color No Abnormalities Noted: No No Abnormalities Noted: No Moisture No Abnormalities Noted: No Electronic Signature(s) Signed: 01/18/2016 10:50:50 AM By: Gretta Cool, RN, BSN, Kim RN, BSN Entered By: Gretta Cool, RN, BSN, Kim on 01/17/2016 16:13:48 Amir, Paralee Wiley ([name] ([id number]) -------------------------------------------------------------------------------- [place] Details Patient Name: Dawn Wiley Date of Service: 01/17/2016 3:45 PM Medical Record Number: [id number] Patient Account Number: [id number] Date of Birth/Sex: 10-30-1934 (81 y.o. Female) Treating RN: Cornell Barman Primary Care Physician: Deborra Medina Other Clinician: Referring Physician: Deborra Medina Treating Physician/Extender: Frann Rider in Treatment: 6 Vital Signs Time Taken: 15:37 Temperature  (F): 97.5 Height (in): 60 Pulse (bpm): 130 Weight (lbs): 108 Respiratory Rate (breaths/min): 18 Body Mass Index (BMI): 21.1 Blood Pressure (mmHg): 115/62 Reference Range: 80 - 120 mg / dl Electronic Signature(s) Signed: 01/18/2016 10:50:50 AM By: Gretta Cool, RN, BSN, Kim RN, BSN Entered By: Gretta Cool, RN, BSN, Kim on 01/17/2016 15:58:00

## 2016-01-18 NOTE — Progress Notes (Addendum)
Dawn, Wiley ([id number]) Visit Report for 01/17/2016 Chief Complaint Document Details Patient Name: Dawn, Wiley Date of Service: 01/17/2016 3:45 PM Medical Record Number: [id number] Patient Account Number: [id number] Date of Birth/Sex: Jun 30, 1934 (81 y.o. Female) Treating RN: Dawn Wiley Primary Care Physician: Dawn Wiley Other Clinician: Referring Physician: Deborra Wiley Treating Physician/Extender: Dawn Wiley in Treatment: 6 Information Obtained from: Patient Chief Complaint Patient presents to the wound care center for a consult due non healing wound to the right chest wall and back which she's had for about 4 months now. Electronic Signature(s) Signed: 01/17/2016 4:22:00 PM By: Dawn Fudge MD, FACS Previous Signature: 01/17/2016 3:57:34 PM Version By: Dawn Fudge MD, FACS Entered By: Dawn Wiley on 01/17/2016 16:22:00 Dawn Wiley ([id number]) -------------------------------------------------------------------------------- HPI Details Patient Name: Dawn Wiley Date of Service: 01/17/2016 3:45 PM Medical Record Number: [id number] Patient Account Number: [id number] Date of Birth/Sex: 15-May-1934 (81 y.o. Female) Treating RN: Dawn Wiley Primary Care Physician: Dawn Wiley Other Clinician: Referring Physician: Deborra Wiley Treating Physician/Extender: Dawn Wiley in Treatment: 6 History of Present Illness Location: multiple areas of metastatic breast cancer over her right anterior chest wall posterior thoracic wall and axilla Quality: Patient reports experiencing a dull pain to affected area(s). Severity: Patient states wound are getting worse. Duration: Patient has had the wound for > 3 months prior to seeking treatment at the wound center Timing: Pain in wound is Intermittent (comes and goes Context: The wound appeared gradually over time Modifying Factors: Other treatment(s) tried include:Radiation therapy and  chemotherapy Associated Signs and Symptoms: Patient reports having increase discharge. HPI Description: 81 year old patient was been recently seen by her PCP Dawn Wiley in early August for the chief complaints of urinary frequency, diabetes mellitus( hemoglobin A1c was 5.6%), acute recurrent cystitis, essential hypertension and metastatic skin lesions from a stage IV triple negative breast cancer. On examination she was noted to have a fungating mass covering the chest wall and a right upper arm. he is receiving palliative radiation to the painful sites of disease. Was referred to [place] for palliative care at the wound care center. in view of Dawn Wiley medical oncology note reveals -- past medical history of anemia, breast cancer, coronary artery disease, diabetes mellitus without complications, hypertension, tachycardia. status post breast surgery and biopsy o3, Right modified radical mastectomy in May 2016, Port-A-Cath placement and then removal. 10/11/2015 -- the patient and her daughter are here for review today and initially they thought that I would not examine her and just prescribe treatments. I understand that the patient has been having profuse bleeding from the tumors on her chest wall and has limited resources as far as dressing changes and home health aides. After examining the patient treating her, control of the bleeding necrotic tumor, I have had a phone conversation with Dawn Wiley regarding the patient's care. With due respect to the management with medical oncology trials, I strongly believe that we may have reached a stage where the patient should be in inpatient hospice setting with comfort measures only. I have communicated this to Dawn Wiley and I have also spoken to the patient's daughter about this. 10/17/2015 -- I have reviewed the electronic medical records after my conversation with Dawn Wiley, which show that the patient is continuing with chemotherapy  and was seen yesterday for teaching. Electronic Signature(s) Signed: 01/17/2016 4:22:08 PM By: Dawn Fudge MD, FACS Previous Signature: 01/17/2016 3:58:18 PM Version By: Dawn Fudge MD, FACS Previous Signature: 01/17/2016 3:56:41 PM Version By: Con Memos  Dawn Mcquitty MD, FACS Entered By: Dawn Wiley on 01/17/2016 16:22:07 Dawn Wiley ([name] ([id number]) -------------------------------------------------------------------------------- Physical Exam Details Patient Name: Dawn Wiley Date of Service: 01/17/2016 3:45 PM Medical Record Number: [id number] Patient Account Number: [id number] Date of Birth/Sex: January 04, 1935 (81 y.o. Female) Treating RN: Dawn Wiley Primary Care Physician: Dawn Wiley Other Clinician: Referring Physician: Deborra Wiley Treating Physician/Extender: Dawn Wiley in Treatment: 6 Constitutional . Pulse regular. Respirations normal and unlabored. Afebrile. . Eyes Nonicteric. Reactive to light. Ears, Nose, Mouth, and Throat Lips, teeth, and gums WNL.[name] Kitchen Moist mucosa without lesions. Neck supple and nontender. No palpable supraclavicular or cervical adenopathy. Normal sized without goiter. Respiratory WNL. No retractions.. Cardiovascular Pedal Pulses WNL. No clubbing, cyanosis or edema. Lymphatic No adneopathy. No adenopathy. No adenopathy. Musculoskeletal Adexa without tenderness or enlargement.. Digits and nails w/o clubbing, cyanosis, infection, petechiae, ischemia, or inflammatory conditions.. Integumentary (Hair, Skin) No suspicious lesions. No crepitus or fluctuance. No peri-wound warmth or erythema. No masses.[name] Kitchen Psychiatric Judgement and insight Intact.. No evidence of depression, anxiety, or agitation.. Notes the fungating tumors all over the chest wall persists and today there is no active bleeding which needs cauterization. Electronic Signature(s) Signed: 01/17/2016 4:22:37 PM By: Dawn Fudge MD, FACS Entered  By: Dawn Wiley on 01/17/2016 16:22:36 Dawn Wiley ([id number]) -------------------------------------------------------------------------------- Physician Orders Details Patient Name: Dawn Wiley Date of Service: 01/17/2016 3:45 PM Medical Record Number: [id number] Patient Account Number: [id number] Date of Birth/Sex: [birth year] (81 y.o. Female) Treating RN: Dawn Wiley Primary Care Physician: Dawn Wiley Other Clinician: Referring Physician: Deborra Wiley Treating Physician/Extender: Dawn Wiley in Treatment: 6 Verbal / Phone Orders: Yes Clinician: Cornell Wiley Read Back and Verified: Yes Diagnosis Coding ICD-10 Coding Code Description [phone] Acute lymphangitis of chest wall Z85.3 Personal history of malignant neoplasm of breast S21.001A Unspecified open wound of right breast, initial encounter Wound Cleansing Wound #1 Right Upper Arm o Clean wound with Normal Saline. o May Shower, gently pat wound dry prior to applying new dressing. Wound #2 Midline Chest o Clean wound with Normal Saline. o May Shower, gently pat wound dry prior to applying new dressing. Wound #3 Right Back o Clean wound with Normal Saline. o May Shower, gently pat wound dry prior to applying new dressing. Primary Wound Dressing Wound #1 Right Upper Arm o Sorbalgon Ag o Xeroform Wound #2 Midline Chest o Sorbalgon Ag o Xeroform Wound #3 Right Back o Sorbalgon Ag o Xeroform Secondary Dressing Wound #1 Right Upper Arm o ABD pad - secure with netting Dawn, Wiley. ([id number]) Wound #2 Midline Chest o ABD pad - secure with netting Wound #3 Right Back o ABD pad - secure with netting Dressing Change Frequency Wound #1 Right Upper Arm o Change dressing every day. Wound #2 Midline Chest o Change dressing every day. Wound #3 Right Back o Change dressing every day. Follow-up Appointments Wound #1 Right Upper Arm o Return Appointment  in 1 month Wound #2 Midline Chest o Return Appointment in 1 month Wound #3 Right Back o Return Appointment in 1 month Electronic Signature(s) Signed: 01/18/2016 10:50:50 AM By: Gretta Cool RN, BSN, Kim RN, BSN Signed: 01/18/2016 3:52:17 PM By: Dawn Fudge MD, FACS Previous Signature: 01/17/2016 4:25:20 PM Version By: Dawn Fudge MD, FACS Entered By: Gretta Cool RN, BSN, Kim on 01/17/2016 16:47:34 [place], Dawn Wiley ([id number]) -------------------------------------------------------------------------------- Problem List Details Patient Name: Dawn Wiley Date of Service: 01/17/2016 3:45 PM Medical Record Number: [id number] Patient Account Number: [id number] Date of Birth/Sex: [birth year] (81 y.o. Female)  Treating RN: Dawn Wiley Primary Care Physician: Dawn Wiley Other Clinician: Referring Physician: Deborra Wiley Treating Physician/Extender: Dawn Wiley in Treatment: 6 Active Problems ICD-10 Encounter Code Description Active Date Diagnosis L03.323 Acute lymphangitis of chest wall 12/04/2015 Yes Z85.3 Personal history of malignant neoplasm of breast 12/04/2015 Yes S21.001A Unspecified open wound of right breast, initial encounter 12/04/2015 Yes Inactive Problems Resolved Problems Electronic Signature(s) Signed: 01/17/2016 4:21:51 PM By: Dawn Fudge MD, FACS Previous Signature: 01/17/2016 3:57:28 PM Version By: Dawn Fudge MD, FACS Previous Signature: 01/17/2016 3:51:12 PM Version By: Dawn Fudge MD, FACS Entered By: Dawn Wiley on 01/17/2016 16:21:51 Dawn Wiley ([id number]) -------------------------------------------------------------------------------- Progress Note Details Patient Name: Dawn Wiley Date of Service: 01/17/2016 3:45 PM Medical Record Number: [id number] Patient Account Number: [id number] Date of Birth/Sex: 09-27-1934 (81 y.o. Female) Treating RN: Dawn Wiley Primary Care Physician: Dawn Wiley Other Clinician: Referring Physician:  Deborra Wiley Treating Physician/Extender: Dawn Wiley in Treatment: 6 Subjective Chief Complaint Information obtained from Patient Patient presents to the wound care center for a consult due non healing wound to the right chest wall and back which she's had for about 4 months now. History of Present Illness (HPI) The following HPI elements were documented for the patient's wound: Location: multiple areas of metastatic breast cancer over her right anterior chest wall posterior thoracic wall and axilla Quality: Patient reports experiencing a dull pain to affected area(s). Severity: Patient states wound are getting worse. Duration: Patient has had the wound for > 3 months prior to seeking treatment at the wound center Timing: Pain in wound is Intermittent (comes and goes Context: The wound appeared gradually over time Modifying Factors: Other treatment(s) tried include:Radiation therapy and chemotherapy Associated Signs and Symptoms: Patient reports having increase discharge. 81 year old patient was been recently seen by her PCP Dawn Wiley in early August for the chief complaints of urinary frequency, diabetes mellitus( hemoglobin A1c was 5.6%), acute recurrent cystitis, essential hypertension and metastatic skin lesions from a stage IV triple negative breast cancer. On examination she was noted to have a fungating mass covering the chest wall and a right upper arm. he is receiving palliative radiation to the painful sites of disease. Was referred to [place] for palliative care at the wound care center. in view of Dawn Wiley medical oncology note reveals -- past medical history of anemia, breast cancer, coronary artery disease, diabetes mellitus without complications, hypertension, tachycardia. status post breast surgery and biopsy o3, Right modified radical mastectomy in May 2016, Port-A-Cath placement and then removal. 10/11/2015 -- the patient and her daughter are here for review  today and initially they thought that I would not examine her and just prescribe treatments. I understand that the patient has been having profuse bleeding from the tumors on her chest wall and has limited resources as far as dressing changes and home health aides. After examining the patient treating her, control of the bleeding necrotic tumor, I have had a phone conversation with Dawn Wiley regarding the patient's care. With due respect to the management with medical oncology trials, I strongly believe that we may have reached a stage where the patient should be in inpatient hospice setting with comfort measures only. I have communicated this to Dawn Wiley and I have also spoken to the patient's daughter about this. 10/17/2015 -- I have reviewed the electronic medical records after my conversation with Dawn Wiley, which show that the patient is continuing with chemotherapy and was seen yesterday for teaching. Dawn, Wiley ([id number])  Objective Constitutional Pulse regular. Respirations normal and unlabored. Afebrile. Vitals Time Taken: 3:37 PM, Height: 60 in, Weight: 108 lbs, BMI: 21.1, Temperature: 97.5 F, Pulse: 130 bpm, Respiratory Rate: 18 breaths/min, Blood Pressure: 115/62 mmHg. Eyes Nonicteric. Reactive to light. Ears, Nose, Mouth, and Throat Lips, teeth, and gums WNL.[name] Kitchen Moist mucosa without lesions. Neck supple and nontender. No palpable supraclavicular or cervical adenopathy. Normal sized without goiter. Respiratory WNL. No retractions.. Cardiovascular Pedal Pulses WNL. No clubbing, cyanosis or edema. Lymphatic No adneopathy. No adenopathy. No adenopathy. Musculoskeletal Adexa without tenderness or enlargement.. Digits and nails w/o clubbing, cyanosis, infection, petechiae, ischemia, or inflammatory conditions.[name] Kitchen Psychiatric Judgement and insight Intact.. No evidence of depression, anxiety, or agitation.. General Notes: the fungating tumors all over the  chest wall persists and today there is no active bleeding which needs cauterization. Integumentary (Hair, Skin) No suspicious lesions. No crepitus or fluctuance. No peri-wound warmth or erythema. No masses.. Wound #1 status is Open. Original cause of wound was Other Lesion. The wound is located on the Right Upper Arm. The wound measures 16cm length x 10cm width x 0.1cm depth; 125.664cm^2 area and 12.566cm^3 volume. Dawn, Wiley ([id number]) Wound #2 status is Open. Original cause of wound was Other Lesion. The wound is located on the Midline Chest. The wound measures 13cm length x 12cm width x 0.1cm depth; 122.522cm^2 area and 12.252cm^3 volume. Wound #3 status is Open. Original cause of wound was Gradually Appeared. The wound is located on the Right Back. The wound measures 17cm length x 16cm width x 0.1cm depth; 213.628cm^2 area and 21.363cm^3 volume. Assessment Active Problems ICD-10 L03.323 - Acute lymphangitis of chest wall Z85.3 - Personal history of malignant neoplasm of breast S21.001A - Unspecified open wound of right breast, initial encounter Plan Wound Cleansing: Wound #1 Right Upper Arm: Clean wound with Normal Saline. May Shower, gently pat wound dry prior to applying new dressing. Wound #2 Midline Chest: Clean wound with Normal Saline. May Shower, gently pat wound dry prior to applying new dressing. Wound #3 Right Back: Clean wound with Normal Saline. May Shower, gently pat wound dry prior to applying new dressing. Primary Wound Dressing: Wound #1 Right Upper Arm: Sorbalgon Ag Xeroform Wound #2 Midline Chest: Sorbalgon Ag Xeroform Wound #3 Right Back: Sorbal[place] Ag Xeroform Secondary Dressing: Wound #1 Right Upper Arm: Dawn, Wiley. ([id number]) ABD pad - secure with netting Wound #2 Midline Chest: ABD pad - secure with netting Wound #3 Right Back: ABD pad - secure with netting Dressing Change Frequency: Wound #1 Right Upper Arm: Change  dressing every day. Wound #2 Midline Chest: Change dressing every day. Wound #3 Right Back: Change dressing every day. Follow-up Appointments: Wound #1 Right Upper Arm: Return Appointment in 1 month Wound #2 Midline Chest: Return Appointment in 1 month Wound #3 Right Back: Return Appointment in 1 month I understand the patient has seen the medical oncologist Dawn Wiley yesterday and she is proceeding with chemotherapy on a trial. The patient and the family say that there is been no active bleeding from the wounds and we will continue with a nonstick layer and silver alginate with ABD pads over this. management of acute bleeding from these wounds have been discussed with the patient and her daughter who was at the bedside. I will see her back for palliative care as needed Electronic Signature(s) Signed: 01/18/2016 3:54:02 PM By: Dawn Fudge MD, FACS Previous Signature: 01/17/2016 4:24:16 PM Version By: Dawn Fudge MD, FACS Entered By: Dawn Wiley on 01/18/2016 15:54:02 Gabor,  Dawn Wiley ([id number]) -------------------------------------------------------------------------------- SuperBill Details Patient Name: Dawn Wiley Date of Service: 01/17/2016 Medical Record Number: [id number] Patient Account Number: [id number] Date of Birth/Sex: 08-19-1934 (81 y.o. Female) Treating RN: Dawn Wiley Primary Care Physician: Dawn Wiley Other Clinician: Referring Physician: Deborra Wiley Treating Physician/Extender: Dawn Wiley in Treatment: 6 Diagnosis Coding ICD-10 Codes Code Description [phone] Acute lymphangitis of chest wall Z85.3 Personal history of malignant neoplasm of breast S21.001A Unspecified open wound of right breast, initial encounter Facility Procedures CPT4 Code: [id number] Description: 99214 - WOUND CARE VISIT-LEV 4 EST PT Modifier: Quantity: 1 Physician Procedures CPT4 Code: [id number] Description: [id number] - WC PHYS LEVEL 3 - EST PT ICD-10 Description  Diagnosis L03.323 Acute lymphangitis of chest wall Z85.3 Personal history of malignant neoplasm of breast S21.001A Unspecified open wound of right breast, initial Modifier: encounter Quantity: 1 Electronic Signature(s) Signed: 01/18/2016 10:50:50 AM By: Gretta Cool, RN, BSN, Kim RN, BSN Signed: 01/18/2016 3:52:17 PM By: Dawn Fudge MD, FACS Previous Signature: 01/17/2016 4:24:29 PM Version By: Dawn Fudge MD, FACS Entered By: Gretta Cool RN, BSN, Kim on 01/18/2016 10:39:25

## 2016-01-18 NOTE — Telephone Encounter (Signed)
-----   Message from [name], MD sent at 01/18/2016 12:56 PM EDT ----- Regarding: Hips films  Patient has a L4 compression fracture (age indeterminate). No pelvic fracture.  M  ----- Message ----- From: Interface, Rad Results In Sent: 01/18/2016   8:06 AM To: [name], MD

## 2016-01-21 ENCOUNTER — Other Ambulatory Visit: Payer: Self-pay

## 2016-01-21 ENCOUNTER — Inpatient Hospital Stay
Admission: EM | Admit: 2016-01-21 | Discharge: 2016-01-27 | DRG: 871 | Disposition: E | Payer: PPO | Attending: Internal Medicine | Admitting: Internal Medicine

## 2016-01-21 ENCOUNTER — Telehealth: Payer: Self-pay | Admitting: *Deleted

## 2016-01-21 ENCOUNTER — Inpatient Hospital Stay: Payer: PPO

## 2016-01-21 ENCOUNTER — Encounter: Payer: Self-pay | Admitting: Emergency Medicine

## 2016-01-21 ENCOUNTER — Ambulatory Visit: Payer: PPO

## 2016-01-21 ENCOUNTER — Emergency Department: Payer: PPO

## 2016-01-21 DIAGNOSIS — Z66 Do not resuscitate: Secondary | ICD-10-CM | POA: Diagnosis present

## 2016-01-21 DIAGNOSIS — J90 Pleural effusion, not elsewhere classified: Secondary | ICD-10-CM | POA: Diagnosis present

## 2016-01-21 DIAGNOSIS — D630 Anemia in neoplastic disease: Secondary | ICD-10-CM | POA: Diagnosis present

## 2016-01-21 DIAGNOSIS — R739 Hyperglycemia, unspecified: Secondary | ICD-10-CM

## 2016-01-21 DIAGNOSIS — Z8249 Family history of ischemic heart disease and other diseases of the circulatory system: Secondary | ICD-10-CM | POA: Diagnosis not present

## 2016-01-21 DIAGNOSIS — Z9889 Other specified postprocedural states: Secondary | ICD-10-CM | POA: Diagnosis not present

## 2016-01-21 DIAGNOSIS — C50911 Malignant neoplasm of unspecified site of right female breast: Secondary | ICD-10-CM | POA: Diagnosis present

## 2016-01-21 DIAGNOSIS — C7989 Secondary malignant neoplasm of other specified sites: Secondary | ICD-10-CM | POA: Diagnosis not present

## 2016-01-21 DIAGNOSIS — I10 Essential (primary) hypertension: Secondary | ICD-10-CM | POA: Diagnosis present

## 2016-01-21 DIAGNOSIS — Z515 Encounter for palliative care: Secondary | ICD-10-CM | POA: Diagnosis present

## 2016-01-21 DIAGNOSIS — Z9842 Cataract extraction status, left eye: Secondary | ICD-10-CM

## 2016-01-21 DIAGNOSIS — Z9841 Cataract extraction status, right eye: Secondary | ICD-10-CM | POA: Diagnosis not present

## 2016-01-21 DIAGNOSIS — Z9011 Acquired absence of right breast and nipple: Secondary | ICD-10-CM

## 2016-01-21 DIAGNOSIS — E785 Hyperlipidemia, unspecified: Secondary | ICD-10-CM | POA: Diagnosis present

## 2016-01-21 DIAGNOSIS — E1165 Type 2 diabetes mellitus with hyperglycemia: Secondary | ICD-10-CM | POA: Diagnosis present

## 2016-01-21 DIAGNOSIS — R000 Tachycardia, unspecified: Secondary | ICD-10-CM | POA: Diagnosis present

## 2016-01-21 DIAGNOSIS — G893 Neoplasm related pain (acute) (chronic): Secondary | ICD-10-CM | POA: Diagnosis present

## 2016-01-21 DIAGNOSIS — Z833 Family history of diabetes mellitus: Secondary | ICD-10-CM | POA: Diagnosis not present

## 2016-01-21 DIAGNOSIS — Z87442 Personal history of urinary calculi: Secondary | ICD-10-CM

## 2016-01-21 DIAGNOSIS — L03313 Cellulitis of chest wall: Secondary | ICD-10-CM | POA: Diagnosis present

## 2016-01-21 DIAGNOSIS — Z79899 Other long term (current) drug therapy: Secondary | ICD-10-CM | POA: Diagnosis not present

## 2016-01-21 DIAGNOSIS — I2510 Atherosclerotic heart disease of native coronary artery without angina pectoris: Secondary | ICD-10-CM | POA: Diagnosis present

## 2016-01-21 DIAGNOSIS — R748 Abnormal levels of other serum enzymes: Secondary | ICD-10-CM | POA: Diagnosis present

## 2016-01-21 DIAGNOSIS — R7989 Other specified abnormal findings of blood chemistry: Secondary | ICD-10-CM

## 2016-01-21 DIAGNOSIS — Z7982 Long term (current) use of aspirin: Secondary | ICD-10-CM

## 2016-01-21 DIAGNOSIS — R6521 Severe sepsis with septic shock: Secondary | ICD-10-CM | POA: Diagnosis present

## 2016-01-21 DIAGNOSIS — A419 Sepsis, unspecified organism: Secondary | ICD-10-CM | POA: Diagnosis not present

## 2016-01-21 DIAGNOSIS — I959 Hypotension, unspecified: Secondary | ICD-10-CM | POA: Diagnosis present

## 2016-01-21 DIAGNOSIS — Z9221 Personal history of antineoplastic chemotherapy: Secondary | ICD-10-CM

## 2016-01-21 DIAGNOSIS — R778 Other specified abnormalities of plasma proteins: Secondary | ICD-10-CM

## 2016-01-21 LAB — COMPREHENSIVE METABOLIC PANEL
ALBUMIN: 2.9 g/dL — AB (ref 3.5–5.0)
ALT: 15 U/L (ref 14–54)
ANION GAP: 13 (ref 5–15)
AST: 32 U/L (ref 15–41)
Alkaline Phosphatase: 91 U/L (ref 38–126)
BILIRUBIN TOTAL: 1.1 mg/dL (ref 0.3–1.2)
BUN: 20 mg/dL (ref 6–20)
CO2: 20 mmol/L — ABNORMAL LOW (ref 22–32)
Calcium: 8.7 mg/dL — ABNORMAL LOW (ref 8.9–10.3)
Chloride: 100 mmol/L — ABNORMAL LOW (ref 101–111)
Creatinine, Ser: 0.73 mg/dL (ref 0.44–1.00)
GFR calc Af Amer: >60 mL/min (ref >60)
GFR calc non Af Amer: >60 mL/min (ref >60)
GLUCOSE: 300 mg/dL — AB (ref 65–99)
POTASSIUM: 3.8 mmol/L (ref 3.5–5.1)
SODIUM: 133 mmol/L — AB (ref 135–145)
Total Protein: 5.9 g/dL — ABNORMAL LOW (ref 6.5–8.1)

## 2016-01-21 LAB — CBC
HEMATOCRIT: 28.9 % — AB (ref 35.0–47.0)
Hemoglobin: 9.5 g/dL — ABNORMAL LOW (ref 12.0–16.0)
MCH: 29.1 pg (ref 26.0–34.0)
MCHC: 33 g/dL (ref 32.0–36.0)
MCV: 88.2 fL (ref 80.0–100.0)
Platelets: 309 10*3/uL (ref 150–440)
RBC: 3.28 MIL/uL — ABNORMAL LOW (ref 3.80–5.20)
RDW: 18.5 % — AB (ref 11.5–14.5)
WBC: 15.7 10*3/uL — ABNORMAL HIGH (ref 3.6–11.0)

## 2016-01-21 LAB — TYPE AND SCREEN
ABO/RH(D): A POS
Antibody Screen: NEGATIVE

## 2016-01-21 LAB — TROPONIN I: TROPONIN I: 0.12 ng/mL — AB (ref <0.03)

## 2016-01-21 LAB — LACTIC ACID, PLASMA: LACTIC ACID, VENOUS: 3.4 mmol/L — AB (ref 0.5–1.9)

## 2016-01-21 MED ORDER — GLYCOPYRROLATE 0.2 MG/ML IJ SOLN
0.2000 mg | INTRAMUSCULAR | Status: DC | PRN
  Filled 2016-01-21: qty 1

## 2016-01-21 MED ORDER — NOREPINEPHRINE BITARTRATE 1 MG/ML IV SOLN: 12.0000 ug/min | Freq: Once | INTRAVENOUS | Status: DC

## 2016-01-21 MED ORDER — ONDANSETRON 4 MG PO TBDP
4.0000 mg | ORAL_TABLET | Freq: Four times a day (QID) | ORAL | Status: DC | PRN
  Filled 2016-01-21: qty 1

## 2016-01-21 MED ORDER — FENTANYL CITRATE (PF) 100 MCG/2ML IJ SOLN
50.0000 ug | Freq: Once | INTRAMUSCULAR | Status: AC
  Administered 2016-01-21: 50 ug via INTRAVENOUS

## 2016-01-21 MED ORDER — FENTANYL CITRATE (PF) 100 MCG/2ML IJ SOLN
25.0000 ug | INTRAMUSCULAR | Status: AC | PRN
  Administered 2016-01-21 (×2): 25 ug via INTRAVENOUS
  Filled 2016-01-21: qty 2

## 2016-01-21 MED ORDER — PIPERACILLIN-TAZOBACTAM 3.375 G IVPB
INTRAVENOUS | Status: AC
Start: 2016-01-21 — End: 2016-01-21
  Administered 2016-01-21: 3.375 g via INTRAVENOUS
  Filled 2016-01-21: qty 50

## 2016-01-21 MED ORDER — VANCOMYCIN HCL 10 G IV SOLR: 1000.0000 mg | Freq: Once | INTRAVENOUS | Status: DC

## 2016-01-21 MED ORDER — ACETAMINOPHEN 650 MG RE SUPP: 650.0000 mg | Freq: Four times a day (QID) | RECTAL | Status: DC | PRN

## 2016-01-21 MED ORDER — HYDROMORPHONE HCL 1 MG/ML IJ SOLN
0.5000 mg | INTRAMUSCULAR | Status: DC | PRN
  Administered 2016-01-21 – 2016-01-22 (×3): 0.5 mg via INTRAVENOUS
  Filled 2016-01-21 (×4): qty 1

## 2016-01-21 MED ORDER — LORAZEPAM 2 MG/ML PO CONC: 1.0000 mg | ORAL | Status: DC | PRN

## 2016-01-21 MED ORDER — VANCOMYCIN HCL IN DEXTROSE 1-5 GM/200ML-% IV SOLN
1000.0000 mg | Freq: Once | INTRAVENOUS | Status: AC
  Administered 2016-01-21: 1000 mg via INTRAVENOUS

## 2016-01-21 MED ORDER — ACETAMINOPHEN 325 MG PO TABS: 650.0000 mg | ORAL_TABLET | Freq: Four times a day (QID) | ORAL | Status: DC | PRN

## 2016-01-21 MED ORDER — FENTANYL CITRATE (PF) 100 MCG/2ML IJ SOLN
INTRAMUSCULAR | Status: AC
  Filled 2016-01-21: qty 2

## 2016-01-21 MED ORDER — VANCOMYCIN HCL IN DEXTROSE 1-5 GM/200ML-% IV SOLN
INTRAVENOUS | Status: AC
  Filled 2016-01-21: qty 200

## 2016-01-21 MED ORDER — MORPHINE SULFATE (PF) 2 MG/ML IV SOLN
2.0000 mg | INTRAVENOUS | Status: DC | PRN
  Administered 2016-01-21 (×2): 2 mg via INTRAVENOUS
  Filled 2016-01-21 (×2): qty 1

## 2016-01-21 MED ORDER — SODIUM CHLORIDE 0.9 % IV BOLUS (SEPSIS)
1000.0000 mL | Freq: Once | INTRAVENOUS | Status: AC
  Administered 2016-01-21: 1000 mL via INTRAVENOUS

## 2016-01-21 MED ORDER — MORPHINE SULFATE (CONCENTRATE) 10 MG/0.5ML PO SOLN
5.0000 mg | ORAL | Status: DC | PRN
  Filled 2016-01-21: qty 1

## 2016-01-21 MED ORDER — NOREPINEPHRINE 4 MG/250ML-% IV SOLN: 0.0000 ug/min | INTRAVENOUS | Status: DC

## 2016-01-21 MED ORDER — PIPERACILLIN-TAZOBACTAM 3.375 G IVPB 30 MIN
3.3750 g | Freq: Once | INTRAVENOUS | Status: AC
  Administered 2016-01-21: 3.375 g via INTRAVENOUS

## 2016-01-21 MED ORDER — ONDANSETRON HCL 4 MG/2ML IJ SOLN: 4.0000 mg | Freq: Four times a day (QID) | INTRAMUSCULAR | Status: DC | PRN

## 2016-01-21 MED ORDER — LORAZEPAM 2 MG/ML IJ SOLN
1.0000 mg | INTRAMUSCULAR | Status: DC | PRN
  Administered 2016-01-21 – 2016-01-22 (×2): 1 mg via INTRAVENOUS
  Filled 2016-01-21 (×2): qty 1

## 2016-01-21 MED ORDER — SODIUM CHLORIDE 0.9 % IV SOLN: INTRAVENOUS | Status: DC

## 2016-01-21 MED ORDER — SODIUM CHLORIDE 0.9 % IV BOLUS (SEPSIS)
1000.0000 mL | Freq: Once | INTRAVENOUS | Status: AC
Start: 2016-01-21 — End: 2016-01-21
  Administered 2016-01-21: 1000 mL via INTRAVENOUS

## 2016-01-21 MED ORDER — LORAZEPAM 1 MG PO TABS: 1.0000 mg | ORAL_TABLET | ORAL | Status: DC | PRN

## 2016-01-21 MED ORDER — GLYCOPYRROLATE 1 MG PO TABS
1.0000 mg | ORAL_TABLET | ORAL | Status: DC | PRN
  Filled 2016-01-21: qty 1

## 2016-01-21 MED ORDER — SODIUM CHLORIDE 0.9 % IV BOLUS (SEPSIS): 500.0000 mL | Freq: Once | INTRAVENOUS | Status: DC

## 2016-01-21 NOTE — ED Notes (Signed)
Patient placed on 3L [place] and paged admitting doctor because patient was complaining of shortness of breath. MD stated orders were placed for oral morphine.

## 2016-01-21 NOTE — ED Notes (Signed)
Nurse dressed wounds on back and chest with non-adherent vasoline dressing and then abd pads and tape.

## 2016-01-21 NOTE — Consult Note (Signed)
[facility] Consultation      Assessment and Plan:  A: Acute septic shock with hypotension likely due to chest wall cellulitis/ fungating chest wall tumor.  Metastatic adenocarcinoma of the right breast.   P: Discussed with patient in presence of family, she is already DNR status, she now opts to be made comfortable, and does not want to be kept alive to suffer.  --Discussed with ED physician and hospitalist service, will be admitted to [facility] with comfort measures only.   -Critical care service will sign off for now, please call if there are any further questions of concerns.   Date: 01/05/2016  MRN# [id number] Dawn Wiley February 06, 1935  Referring Physician: Dr. Mariea Clonts.   Dawn Wiley is a 81 y.o. old female seen in consultation for chief complaint of:    Chief Complaint  Patient presents with  . Near Syncope    HPI:   The patient is a 81 yo female with a history of aggressive right breast cancer. Review of the patient's chart/medical history shows that the patient has had a long course, she is s/p right mastectomy, with recurrence of cancer in the chest wall and spread to the right arm. She has gone through numerous courses of chemotherapy, including clinical trial, the cancer has responded, but each time occurs very quickly and severely. She has been living with this worsening fungating mass in her right chest wall and arm.  She is somnolent but arousable, most of the history was obtained from the daughter at the bedside, she is tearful as she knows that her mom is very sick. She describes that her mom has lead a full life despite her cancer, and was living independently. She has noticed increased bleeding from her chest wall tumor sites.  The patient has received 2L bolus of saline, she is currently in trendelenburg to keep pressure up. Currently her pressure is 74/50's. She arouses and tells me that she expresses that even if she is treated and improves  that the cancer will come back and she will be no better than before.  She spoke with her grandson on the phone and said good bye to him as we were talking.  She expressed that she would like to be treated for pain, but she does not want to be kept alive to suffer longer.   We discussed this in the presence of her daughter and son-in-law who support their mother's decision.    PMHX:   Past Medical History:  Diagnosis Date  . Anemia   . Arthritis   . Breast cancer ([place])   . Cancer [facility])    breast (right)  . Coronary artery disease    Coronary calcifications noted on CT scan  . Diabetes mellitus without complication ([place])   . Dysrhythmia    TACHYCARDIA-METOPROLOL CONTROLS THIS WELL  . Headache    H/O MIGRAINES  . Hyperlipidemia   . Hypertension   . Kidney stones   . Nephrolithiasis   . PONV (postoperative nausea and vomiting)   . Rash    back  . Tachycardia    Dr. Fletcher Anon, cardiologist   Surgical Hx:  Past Surgical History:  Procedure Laterality Date  . BREAST SURGERY     breast biopsy X 3  . CATARACT EXTRACTION     bilateral  . EYE SURGERY     bilateral cataract extraction  . MASTECTOMY MODIFIED RADICAL Right 08/31/2014   Procedure: MASTECTOMY MODIFIED RADICAL;  Surgeon: Molly Maduro, MD;  Location:  [facility] ORS;  Service: General;  Laterality: Right;  . PORT-A-CATH REMOVAL N/A 10/08/2015   Procedure: REMOVAL PORT-A-CATH;  Surgeon: Clayburn Pert, MD;  Location: [facility] ORS;  Service: General;  Laterality: N/A;  . PORTACATH PLACEMENT Left 10/20/2014   Procedure: INSERTION PORT-A-CATH;  Surgeon: Marlyce Huge, MD;  Location: [facility] ORS;  Service: General;  Laterality: Left;  . PORTACATH PLACEMENT Left 12/17/2015   Procedure: INSERTION PORT-A-CATH;  Surgeon: Jules Husbands, MD;  Location: [facility] ORS;  Service: General;  Laterality: Left;  . TEMPOROMANDIBULAR JOINT SURGERY     Family Hx:  Family History  Problem Relation Age of Onset  . Peripheral Artery Disease Sister      carotid artery stenosis   . Heart disease Sister   . Diabetes Sister    Social Hx:   Social History  Substance Use Topics  . Smoking status: Never Smoker  . Smokeless tobacco: Never Used  . Alcohol use No   Medication:   Reviewed.     Allergies:  Review of patient's allergies indicates no known allergies.  Review of Systems: Gen:  Denies  fever, sweats, chills HEENT: Denies blurred vision, double vision. bleeds, sore throat Cvc:  No dizziness, chest pain. Ext:   No Joint pain, stiffness. Skin: No skin rash,  hives  Endoc:  No polyuria, polydipsia. Psych: No depression, insomnia. Other:  All other systems were reviewed with the patient and were negative other that what is mentioned in the HPI.   Physical Examination:   VS: BP (!) 86/58   Pulse (!) 109   Resp (!) 5   Ht 4\' 7"  (1.397 m)   Wt 44.5 kg (98 lb)   SpO2 94%   BMI 22.78 kg/m   General Appearance: somnolent.  Neuro:without focal findings,  speech normal,  HEENT: PERRLA, EOM intact.   Pulmonary: normal breath sounds, No wheezing.  Chest wall: diffuse lesions throught the anterior chest wall and upper part of RUE which appear to be slowly ozzing blood.  CardiovascularNormal S1,S2.  No m/r/g.   Abdomen: Benign, Soft, non-tender. GU:  No performed at this time. Endoc: No evident thyromegaly, no signs of acromegaly. Skin:   warm, no rashes, no ecchymosis  Extremities: normal, no cyanosis, clubbing.  Other findings:    LABORATORY PANEL:   CBC  Recent Labs Lab 12/30/2015 1221  WBC 15.7*  HGB 9.5*  HCT 28.9*  PLT 309   ------------------------------------------------------------------------------------------------------------------  Chemistries   Recent Labs Lab 01/20/2016 1221  NA 133*  K 3.8  CL 100*  CO2 20*  GLUCOSE 300*  BUN 20  CREATININE 0.73  CALCIUM 8.7*  AST 32  ALT 15  ALKPHOS 91  BILITOT 1.1    ------------------------------------------------------------------------------------------------------------------  Cardiac Enzymes  Recent Labs Lab 01/09/2016 1221  TROPONINI 0.12*   ------------------------------------------------------------  RADIOLOGY:  Dg Chest Portable 1 View  Result Date: 01/18/2016 CLINICAL DATA:  Near syncopal episode. Stage IV breast malignancy. Cutaneous wounds at the site of previous right mastectomy. Mastectomy. EXAM: PORTABLE CHEST 1 VIEW COMPARISON:  Portable chest x-ray of December 17, 2015 FINDINGS: The lungs are adequately inflated. The interstitial markings are increased. There is an approximately 8 mm diameter soft tissue density nodule projecting in the right pulmonary apex which overlies the first rib. This was not previously demonstrated The pulmonary vascularity is engorged. The cardiac silhouette is enlarged. There is a small left pleural effusion. There is no pneumothorax. The porta catheter tip projects over the midportion of the SVC. IMPRESSION: Findings consistent with pulmonary  edema with small left pleural effusion. No definite pneumonia. Possible subcentimeter right upper lobe pulmonary nodule for which CT follow-up is recommended when the patient can undergo the procedure. Electronically Signed   By: David  Martinique M.D.   On: 01/19/2016 12:49       Thank  you for the consultation and for allowing [facility] to assist in the care of your patient. Our recommendations are noted above.  Please contact us if we can be of further service.   Marda Stalker, MD.  Board Certified in Internal Medicine, Pulmonary Medicine, [place], and Sleep Medicine.  [facility] Office Number: [phone]  Patricia Pesa, M.D.  Vilinda Boehringer, M.D.  Merton Border, M.D  01/15/2016   Critical Care Attestation.  I have personally obtained a history, examined the patient, evaluated laboratory and  imaging results, formulated the assessment and plan and placed orders. The Patient requires high complexity decision making for assessment and support, frequent evaluation and titration of therapies, application of advanced monitoring technologies and extensive interpretation of multiple databases. The patient has critical illness that could lead imminently to failure of 1 or more organ systems and requires the highest level of physician preparedness to intervene.  Critical Care Time devoted to patient care services described in this note is 35 minutes and is exclusive of time spent in procedures.

## 2016-01-21 NOTE — Telephone Encounter (Signed)
Called to daughter and let her know that hgb 9.8 on Friday so pt does not need transfusion today. She will call pt and let her know. I thought pt would be sleeping and did not want to wake her.

## 2016-01-21 NOTE — Consult Note (Signed)
[facility] Nurse wound consult note Reason for Consult: Stage IV breast cancer with fungating tumor lesions to right upper arm, midline chest and right back.  Seen at wound care center.  LAst seen 01/17/16 Wound type:Cancerous lesions Pressure Ulcer POA: N/A Wound [id number], pink lesions Drainage (amount, consistency, odor) Moderate serosanguinous   Periwound:Intact Dressing procedure/placement/frequency:Cleanse lesions to right upper arm, midline chest and right back with NS.  Apply Xeroform to the nonintact lesions.  Cover with ABD pad and mesh netting to hold in place. Change daily.  Will not follow at this time.  Please re-consult if needed.  Domenic Moras RN BSN [facility] Pager [phone]

## 2016-01-21 NOTE — H&P (Signed)
[place] [place] NAME: Dawn Wiley    MR#:  [id number]  DATE OF BIRTH:  12-15-1934  DATE OF ADMISSION:  01/07/2016  PRIMARY CARE PHYSICIAN: Crecencio Mc, MD   REQUESTING/REFERRING PHYSICIAN: norman  CHIEF COMPLAINT:   Chief Complaint  Patient presents with  . Near Syncope    HISTORY OF PRESENT ILLNESS: Dawn Wiley  is a 81 y.o. female with a known history of fungating breast cancer- failed all chemo and research meds trials. Was at home and very weak so called EMS- found hypotensive with tachycardia- in ER- noted to have sepsis. High lactic acid, hypotension, elevated WBCs. IV fluids started and ICU consult called in- on further discussion by ICU- family decided to just give comfort measuress to her. So called hospitalist team to admit.  PAST MEDICAL HISTORY:   Past Medical History:  Diagnosis Date  . Anemia   . Arthritis   . Breast cancer ([place])   . Cancer [facility])    breast (right)  . Coronary artery disease    Coronary calcifications noted on CT scan  . Diabetes mellitus without complication ([place])   . Dysrhythmia    TACHYCARDIA-METOPROLOL CONTROLS THIS WELL  . Headache    H/O MIGRAINES  . Hyperlipidemia   . Hypertension   . Kidney stones   . Nephrolithiasis   . PONV (postoperative nausea and vomiting)   . Rash    back  . Tachycardia    Dr. Fletcher Anon, cardiologist    PAST SURGICAL HISTORY: Past Surgical History:  Procedure Laterality Date  . BREAST SURGERY     breast biopsy X 3  . CATARACT EXTRACTION     bilateral  . EYE SURGERY     bilateral cataract extraction  . MASTECTOMY MODIFIED RADICAL Right 08/31/2014   Procedure: MASTECTOMY MODIFIED RADICAL;  Surgeon: Molly Maduro, MD;  Location: [facility] ORS;  Service: General;  Laterality: Right;  . PORT-A-CATH REMOVAL N/A 10/08/2015   Procedure: REMOVAL PORT-A-CATH;  Surgeon: Clayburn Pert, MD;  Location: [facility] ORS;  Service: General;  Laterality: N/A;  . PORTACATH  PLACEMENT Left 10/20/2014   Procedure: INSERTION PORT-A-CATH;  Surgeon: Marlyce Huge, MD;  Location: [facility] ORS;  Service: General;  Laterality: Left;  . PORTACATH PLACEMENT Left 12/17/2015   Procedure: INSERTION PORT-A-CATH;  Surgeon: Jules Husbands, MD;  Location: [facility] ORS;  Service: General;  Laterality: Left;  . TEMPOROMANDIBULAR JOINT SURGERY      SOCIAL HISTORY:  Social History  Substance Use Topics  . Smoking status: Never Smoker  . Smokeless tobacco: Never Used  . Alcohol use No    FAMILY HISTORY:  Family History  Problem Relation Age of Onset  . Peripheral Artery Disease Sister     carotid artery stenosis   . Heart disease Sister   . Diabetes Sister     DRUG ALLERGIES: No Known Allergies  REVIEW OF SYSTEMS:   Due to pain and sepsis, pt is nto in the state of giving ros.  MEDICATIONS AT HOME:  Prior to Admission medications   Medication Sig Start Date End Date Taking? Authorizing Provider  amLODipine (NORVASC) 5 MG tablet Take 1 tablet (5 mg total) by mouth daily. Patient taking differently: Take 5 mg by mouth every morning.  08/15/15  Yes Crecencio Mc, MD  capecitabine (XELODA) 500 MG tablet Take 3 tablets (1,500 mg total) by mouth 2 (two) times daily after a meal. for 14 days then 7 days off. 12/21/15  Yes [name]  [name], MD  HYDROcodone-acetaminophen (NORCO) 7.5-325 MG tablet Take 1 tablet by mouth every 6 (six) hours as needed for moderate pain. 01/16/16  Yes [name], MD  lapatinib (TYKERB) 250 MG tablet Take 5 tablets (1,250 mg total) by mouth daily. 12/21/15  Yes [name], MD  metoprolol tartrate (LOPRESSOR) 25 MG tablet take 1 tablet by mouth twice a day 01/02/16  Yes Crecencio Mc, MD  ondansetron (ZOFRAN) 4 MG tablet Take 1 tablet (4 mg total) by mouth every 6 (six) hours as needed for nausea or vomiting. 12/21/15  Yes [name], MD  potassium chloride (K-DUR,KLOR-CON) 10 MEQ tablet Take 1 tablet (10 mEq total) by mouth daily.  11/05/15  Yes [name], MD  temazepam (RESTORIL) 15 MG capsule 1 tablet at night as needed for sleep Patient taking differently: Take 15 mg by mouth at bedtime as needed. 1 tablet at night as needed for sleep 01/16/16  Yes [name], MD  acetaminophen (TYLENOL) 500 MG tablet Take 500 mg by mouth every 6 (six) hours as needed for mild pain or moderate pain.     Historical Provider, MD  aspirin 81 MG tablet Take 81 mg by mouth every other day.    Historical Provider, MD  Calcium Carbonate (CALTRATE 600 PO) Take 1,200 mg by mouth daily.     Historical Provider, MD  cholecalciferol (VITAMIN D) 400 UNITS TABS tablet Take 800 Units by mouth.    Historical Provider, MD  Cyanocobalamin (VITAMIN B 12 PO) Take 1 tablet by mouth every morning.    Historical Provider, MD  Cyanocobalamin (VITAMIN B-12 IJ) Inject as directed every 30 (thirty) days.     Historical Provider, MD  diphenhydrAMINE (BENADRYL) 25 mg capsule Take 25 mg by mouth daily.     Historical Provider, MD  ferrous sulfate 325 (65 FE) MG tablet Take 325 mg by mouth daily.     Historical Provider, MD  hydrOXYzine (ATARAX/VISTARIL) 25 MG tablet take 1/2 to 1 tablet by mouth every 6 hours if needed for itching Patient not taking: Reported on 01/04/2016 01/16/16   [name], MD  lidocaine-prilocaine (EMLA) cream Apply cream 1 hour before chemotherapy treatment, cover cream with saran wrap to protect clothing Patient not taking: Reported on 12/28/2015 01/01/16   [name], MD      PHYSICAL EXAMINATION:   VITAL SIGNS: Blood pressure (!) 86/58, pulse (!) 109, resp. rate (!) 5, height 4\' 7"  (1.397 m), weight 44.5 kg (98 lb), SpO2 94 %.  GENERAL:  81 y.o.-year-old thin patient lying in the bed with no acute distress.  EYES: Pupils equal, round, reactive to light and accommodation. No scleral icterus. Extraocular muscles intact.  HEENT: Head atraumatic, normocephalic. Oropharynx and nasopharynx clear. Mucosa dry. NECK:   Supple, no jugular venous distention. No thyroid enlargement, no tenderness.  LUNGS: Normal breath sounds bilaterally, no wheezing, rales,rhonchi or crepitation. No use of accessory muscles of respiration.  CARDIOVASCULAR: S1, S2 normal. No murmurs, rubs, or gallops.  ABDOMEN: Soft, nontender, nondistended. Bowel sounds present. No organomegaly or mass.  EXTREMITIES: No pedal edema, cyanosis, or clubbing.  NEUROLOGIC: Cranial nerves II through XII are intact. Muscle strength 2-3/5 in all extremities. Sensation intact. Gait not checked.  PSYCHIATRIC: The patient is alert and oriented x 2.  SKIN: on right side chest and upper back- fungating skin lesions with superficial bleeding present.   LABORATORY PANEL:   CBC  Recent Labs Lab 01/18/16 1536 01/19/2016 1221  WBC 9.6 15.7*  HGB 9.8* 9.5*  HCT 29.5* 28.9*  PLT 272 309  MCV 87.5 88.2  MCH 28.9 29.1  MCHC 33.0 33.0  RDW 17.5* 18.5*  LYMPHSABS 1.7  --   MONOABS 0.8  --   EOSABS 0.0  --   BASOSABS 0.0  --    ------------------------------------------------------------------------------------------------------------------  Chemistries   Recent Labs Lab 01/18/16 1536 01/06/2016 1221  NA 133* 133*  K 4.0 3.8  CL 100* 100*  CO2 26 20*  GLUCOSE 235* 300*  BUN 21* 20  CREATININE 0.71 0.73  CALCIUM 8.7* 8.7*  AST  --  32  ALT  --  15  ALKPHOS  --  91  BILITOT  --  1.1   ------------------------------------------------------------------------------------------------------------------ estimated creatinine clearance is 34.4 mL/min (by C-G formula based on SCr of 0.73 mg/dL). ------------------------------------------------------------------------------------------------------------------ No results for input(s): TSH, T4TOTAL, T3FREE, THYROIDAB in the last 72 hours.  Invalid input(s): FREET3   Coagulation profile No results for input(s): INR, PROTIME in the last 168  hours. ------------------------------------------------------------------------------------------------------------------- No results for input(s): DDIMER in the last 72 hours. -------------------------------------------------------------------------------------------------------------------  Cardiac Enzymes  Recent Labs Lab 01/04/2016 1221  TROPONINI 0.12*   ------------------------------------------------------------------------------------------------------------------ Invalid input(s): POCBNP  ---------------------------------------------------------------------------------------------------------------  Urinalysis    Component Value Date/Time   COLORURINE YELLOW 12/26/2015 1621   APPEARANCEUR CLEAR 12/26/2015 1621   APPEARANCEUR Clear 02/08/2012 1504   LABSPEC >=1.030 (A) 12/26/2015 1621   LABSPEC 1.024 02/08/2012 1504   PHURINE 5.5 12/26/2015 1621   GLUCOSEU NEGATIVE 12/26/2015 1621   HGBUR TRACE-INTACT (A) 12/26/2015 1621   BILIRUBINUR small 12/26/2015 1629   BILIRUBINUR Negative 02/08/2012 1504   KETONESUR TRACE (A) 12/26/2015 1621   PROTEINUR 100 12/26/2015 1629   PROTEINUR 30 (A) 09/18/2015 1200   UROBILINOGEN 0.2 12/26/2015 1629   UROBILINOGEN 0.2 12/26/2015 1621   NITRITE positive 12/26/2015 1629   NITRITE POSITIVE (A) 12/26/2015 1621   LEUKOCYTESUR small (1+) (A) 12/26/2015 1629   LEUKOCYTESUR 1+ 02/08/2012 1504     RADIOLOGY: Dg Chest Portable 1 View  Result Date: 12/30/2015 CLINICAL DATA:  Near syncopal episode. Stage IV breast malignancy. Cutaneous wounds at the site of previous right mastectomy. Mastectomy. EXAM: PORTABLE CHEST 1 VIEW COMPARISON:  Portable chest x-ray of December 17, 2015 FINDINGS: The lungs are adequately inflated. The interstitial markings are increased. There is an approximately 8 mm diameter soft tissue density nodule projecting in the right pulmonary apex which overlies the first rib. This was not previously demonstrated The pulmonary  vascularity is engorged. The cardiac silhouette is enlarged. There is a small left pleural effusion. There is no pneumothorax. The porta catheter tip projects over the midportion of the SVC. IMPRESSION: Findings consistent with pulmonary edema with small left pleural effusion. No definite pneumonia. Possible subcentimeter right upper lobe pulmonary nodule for which CT follow-up is recommended when the patient can undergo the procedure. Electronically Signed   By: David  Martinique M.D.   On: 01/25/2016 12:49    EKG: Orders placed or performed during the hospital encounter of 01/15/2016  . ED EKG  . ED EKG  . ED EKG 12-Lead  . ED EKG 12-Lead    IMPRESSION AND PLAN:  * sepsis likely due to cellulitis on fungating skin lesions  Breast cancer with fungating lesions.   Pt's daughter agreed on comfort measures only.   IV fluids, morphin as needed.   Palliative care consult.  All the records are reviewed and case discussed with ED provider. Management plans discussed with the patient, family  and they are in agreement.  CODE STATUS: DNR    Code Status Orders        Start     Ordered   01/24/2016 1520  Do not attempt resuscitation/DNR  Continuous    Question Answer Comment  In the event of cardiac or respiratory ARREST Do not call a "code blue"   In the event of cardiac or respiratory ARREST Do not perform Intubation, CPR, defibrillation or ACLS   In the event of cardiac or respiratory ARREST Use medication by any route, position, wound care, and other measures to relive pain and suffering. May use oxygen, suction and manual treatment of airway obstruction as needed for comfort.      01/14/2016 1519    Code Status History    Date Active Date Inactive Code Status Order ID Comments User Context   08/31/2014  5:29 PM 09/02/2014  1:48 PM Full Code [id number]  Molly Maduro, MD Inpatient     Discussed with pt's daughter in room.  TOTAL TIME TAKING CARE OF THIS PATIENT: 40 minutes.    Vaughan Basta M.D on 01/13/2016   Between 7am to 6pm - Pager - [phone]  After 6pm go to [email or web address] - password [email or web address] [facility]  Office  [phone]  CC: Primary care physician; Crecencio Mc, MD   Note: This dictation was prepared with Dragon dictation along with smaller phrase technology. Any transcriptional errors that result from this process are unintentional.

## 2016-01-21 NOTE — ED Notes (Addendum)
MD Critical care at bedside.

## 2016-01-21 NOTE — ED Notes (Signed)
CODE SEPSIS CALLED TO [facility] AT [facility] 

## 2016-01-21 NOTE — ED Provider Notes (Signed)
[facility] Emergency Department Provider Note  ____________________________________________  Time seen: Approximately 11:52 AM  I have reviewed the triage vital signs and the nursing notes.   HISTORY  Chief Complaint Near Syncope    HPI Dawn Wiley is a 81 y.o. female with a history of stage IV breast cancer including cancerous wounds on the chest wall, CAD, anemia presenting for generalized weakness. The patient is living independently in a trailer behind her daughter's home and usually is able to do her own ADLs. This morning, she called her daughter because she was sitting on the toilet and was so weak she was unable to get up by herself. The patient denies any cough or cold symptoms, nausea vomiting or diarrhea, fever or chills.   Past Medical History:  Diagnosis Date  . Anemia   . Arthritis   . Breast cancer ([place])   . Cancer [facility])    breast (right)  . Coronary artery disease    Coronary calcifications noted on CT scan  . Diabetes mellitus without complication ([place])   . Dysrhythmia    TACHYCARDIA-METOPROLOL CONTROLS THIS WELL  . Headache    H/O MIGRAINES  . Hyperlipidemia   . Hypertension   . Kidney stones   . Nephrolithiasis   . PONV (postoperative nausea and vomiting)   . Rash    back  . Tachycardia    Dr. Fletcher Anon, cardiologist    Patient Active Problem List   Diagnosis Date Noted  . Weight loss 01/06/2016  . Pruritus 12/22/2015  . Acute recurrent cystitis 12/01/2015  . Insomnia 11/05/2015  . Portacath in place   . Inflammatory breast cancer ([place]) 08/21/2015  . Skin metastases ([place]) 06/28/2015  . Neoplasm related pain 03/28/2015  . OP (osteoporosis) 10/05/2014  . Iron deficiency anemia 10/05/2014  . B12 deficiency 10/05/2014  . Dermatitis 03/30/2014  . Fatigue 02/12/2014  . Anemia 02/12/2014  . Statin intolerance 08/08/2013  . Carotid artery bruit 01/13/2013  . Nephrolithiasis   . Excessive urination at night 09/08/2012   . 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 Preop cardiovascular exam 04/01/2012  . Hyperlipidemia   . Diabetes mellitus without complication ([place])   . Tachycardia 03/15/2012  . Dyspnea 03/15/2012  . Hypertension 03/15/2012  . Calculus of kidney 02/19/2012  . Calculi, ureter 02/19/2012  . Hydronephrosis 02/19/2012    Past Surgical History:  Procedure Laterality Date  . BREAST SURGERY     breast biopsy X 3  . CATARACT EXTRACTION     bilateral  . EYE SURGERY     bilateral cataract extraction  . MASTECTOMY MODIFIED RADICAL Right 08/31/2014   Procedure: MASTECTOMY MODIFIED RADICAL;  Surgeon: Molly Maduro, MD;  Location: [facility] ORS;  Service: General;  Laterality: Right;  . PORT-A-CATH REMOVAL N/A 10/08/2015   Procedure: REMOVAL PORT-A-CATH;  Surgeon: Clayburn Pert, MD;  Location: [facility] ORS;  Service: General;  Laterality: N/A;  . PORTACATH PLACEMENT Left 10/20/2014   Procedure: INSERTION PORT-A-CATH;  Surgeon: Marlyce Huge, MD;  Location: [facility] ORS;  Service: General;  Laterality: Left;  . PORTACATH PLACEMENT Left 12/17/2015   Procedure: INSERTION PORT-A-CATH;  Surgeon: Jules Husbands, MD;  Location: [facility] ORS;  Service: General;  Laterality: Left;  . TEMPOROMANDIBULAR JOINT SURGERY      Current Outpatient Rx  . Order #: [id number] Class: Historical Med  . Order #: [id number] Class: Normal  . Order #: [id number] Class: Historical Med  . Order #: [id number] Class: Historical Med  . Order #: [id number] Class: Print  . Order #: [id number] Class: Historical  Med  . Order #: [id number] Class: Historical Med  . Order #: [id number] Class: Historical Med  . Order #: [id number] Class: Historical Med  . Order #: [id number] Class: Historical Med  . Order #: [id number] Class: Print  . Order #: [id number] Class: Normal  . Order #: [id number] Class: Print  . Order #: [id number] Class: Print  . Order #: [id number] Class: Normal  . Order #: [id number] Class: Normal  . Order #: [id number] Class: Normal  . Order #:  [id number] Class: Print    Allergies Review of patient's allergies indicates no known allergies.  Family History  Problem Relation Age of Onset  . Peripheral Artery Disease Sister     carotid artery stenosis   . Heart disease Sister   . Diabetes Sister     Social History Social History  Substance Use Topics  . Smoking status: Never Smoker  . Smokeless tobacco: Never Used  . Alcohol use No    Review of Systems Constitutional: No fever/chills.Positive generalized weakness. Negative syncope. Eyes: No visual changes. No blurred or double vision. No eye discharge. ENT: No sore throat. No congestion or rhinorrhea. Cardiovascular: Positive chronic chest wall pain. Denies palpitations. Respiratory: Denies shortness of breath.  No cough. Gastrointestinal: No abdominal pain.  No nausea, no vomiting.  No diarrhea.  No constipation. Genitourinary: Negative for dysuria. Musculoskeletal: Negative for back pain. Skin: Negative for rash. Positive for nonhealing cancerous wounds on the chest wall. Neurological: Negative for headaches. No focal numbness, tingling or weakness.   10-point ROS otherwise negative.  ____________________________________________   PHYSICAL EXAM:  VITAL SIGNS: ED Triage Vitals  Enc Vitals Group     BP      Pulse      Resp      Temp      Temp src      SpO2      Weight      Height      Head Circumference      Peak Flow      Pain Score      Pain Loc      Pain Edu?      Excl. in [place]?     Constitutional: Alert and oriented. Chronically ill-appearing and uncomfortable but nontoxic. The patient is able to answer questions appropriately. Eyes: Conjunctivae are normal.  EOMI. No scleral icterus. Head: Atraumatic. Nose: No congestion/rhinnorhea. Mouth/Throat: Mucous membranes are moist.  Neck: No stridor.  Supple.  As of JVD. No meningismus. Cardiovascular: Fast rate, regular rhythm. No murmurs, rubs or gallops.  CHEST WALL: multiple large tumors  throughout the right breast, right axillae, and posterior right back lateral to the scapulae which demonstrate, bleeding, some purulence, and necrosis. Respiratory: Mild tachypnea with accessory muscle use but no retractions. Lungs CTAB.  No wheezes, rales or ronchi. Gastrointestinal: Soft, nontender and mildly distended..  No guarding or rebound.  No peritoneal signs. Musculoskeletal: No LE edema. No ttp in the calves or palpable cords.  Negative Homan's sign. Neurologic:  A&Ox3.  Speech is clear.  Face and smile are symmetric.  EOMI.  Moves all extremities well. Skin:    Positive pallor. Psychiatric: Mood and affect are normal. Speech and behavior are normal.  Normal judgement.  ____________________________________________   LABS (all labs ordered are listed, but only abnormal results are displayed)  Labs Reviewed  CBC - Abnormal; Notable for the following:       Result Value   WBC 15.7 (*)    RBC 3.28 (*)    Hemoglobin 9.5 (*)    HCT 28.9 (*)  RDW 18.5 (*)    All other components within normal limits  COMPREHENSIVE METABOLIC PANEL - Abnormal; Notable for the following:    Sodium 133 (*)    Chloride 100 (*)    CO2 20 (*)    Glucose, Bld 300 (*)    Calcium 8.7 (*)    Total Protein 5.9 (*)    Albumin 2.9 (*)    All other components within normal limits  TROPONIN I - Abnormal; Notable for the following:    Troponin I 0.12 (*)    All other components within normal limits  LACTIC ACID, PLASMA - Abnormal; Notable for the following:    Lactic Acid, Venous 3.4 (*)    All other components within normal limits  CULTURE, BLOOD (ROUTINE X 2)  CULTURE, BLOOD (ROUTINE X 2)  URINE CULTURE  URINALYSIS COMPLETEWITH MICROSCOPIC ([facility] ONLY)  LACTIC ACID, PLASMA  TYPE AND SCREEN   ____________________________________________  EKG  ED ECG REPORT I, Eula Listen, the attending physician, personally viewed and interpreted this ECG.   Date: 01/06/2016  EKG Time: 1132  Rate:  116  Rhythm: sinus tachycardia  Axis: normal  Intervals:none  ST&T Change: No STEMI.  Poor baseline tracing.  ____________________________________________  RADIOLOGY  Dg Chest Portable 1 View  Result Date: 01/14/2016 CLINICAL DATA:  Near syncopal episode. Stage IV breast malignancy. Cutaneous wounds at the site of previous right mastectomy. Mastectomy. EXAM: PORTABLE CHEST 1 VIEW COMPARISON:  Portable chest x-ray of December 17, 2015 FINDINGS: The lungs are adequately inflated. The interstitial markings are increased. There is an approximately 8 mm diameter soft tissue density nodule projecting in the right pulmonary apex which overlies the first rib. This was not previously demonstrated The pulmonary vascularity is engorged. The cardiac silhouette is enlarged. There is a small left pleural effusion. There is no pneumothorax. The porta catheter tip projects over the midportion of the SVC. IMPRESSION: Findings consistent with pulmonary edema with small left pleural effusion. No definite pneumonia. Possible subcentimeter right upper lobe pulmonary nodule for which CT follow-up is recommended when the patient can undergo the procedure. Electronically Signed   By: David  Martinique M.D.   On: 01/03/2016 12:49    ____________________________________________   PROCEDURES  Procedure(s) performed: None  Procedures  Critical Care performed: Yes, see critical care note(s) ____________________________________________   INITIAL IMPRESSION / ASSESSMENT AND PLAN / ED COURSE  Pertinent labs & imaging results that were available during my care of the patient were reviewed by me and considered in my medical decision making (see chart for details).  81 y.o. female with advanced breast cancer presenting for generalized weakness. Overall, the patient has vital signs that are concerning for hypotension, and tachycardia. There are multiple possible causes for her symptoms, including progression of her cancer,  infection from her skin-related malignancy, dehydration, anemia, arrhythmia, UTI. The patient will require an extensive workup in the emergency department and likely admission to the hospital.  The patient is DNR/DNI.  ----------------------------------------- 1:17 PM on 01/21/2016 -----------------------------------------  The patient's blood pressure did drop into the 80s, but is now in the 90s after fluid. She continues to be tachycardic. She has had no improvement in her generalized weakness. I am concerned about sepsis is one possible etiology, but there remained multiple other possible causes for her generalized weakness. I've discussed palliative care consult with the patient and her daughter, and the patient states "I don't want to deal with that right now." They understand that they can change their mind any point.  The patient does have a positive troponin, but this is likely demand and there is no indication for acute intervention at this time. When I have all her lab results back, I will plan to admit her.  CRITICAL CARE Performed by: Eula Listen   Total critical care time: 75 minutes  Critical care time was exclusive of separately billable procedures and treating other patients.  Critical care was necessary to treat or prevent imminent or life-threatening deterioration.  Critical care was time spent personally by me on the following activities: development of treatment plan with patient and/or surrogate as well as nursing, discussions with consultants, evaluation of patient's response to treatment, examination of patient, obtaining history from patient or surrogate, ordering and performing treatments and interventions, ordering and review of laboratory studies, ordering and review of radiographic studies, pulse oximetry and re-evaluation of patient's condition.  ----------------------------------------- 1:55 PM on  01/20/2016 -----------------------------------------  At this time, the patient is ready for admission. She continues to have a blood pressure in the 80's and 90's which drop slightly after fentanyl, but has been responsive to IV fluids. Her heart rate is still tachycardic. She is hyperglycemic. Plan admission; the intensivist has been paged.  ----------------------------------------- 2:40 PM on 01/09/2016 -----------------------------------------  Patient has had 2 blood pressure readings things while in the supine position with a systolic in the [id number] and then 71. I've spoken to the patient and her daughter about initiating Levophed, which we will start at this time. She has a central access port, which we continues for the drip. The intensivist has been paged back.  ----------------------------------------- 3:07 PM on 01/05/2016 -----------------------------------------  The patient has been seen and evaluated by the intensivist. At this time, the patient and her family have both agreed that their goal is comfort care. We will remove the Leiva fed, and treat her symptomatically for pain. She'll be admitted to a regular floor bed. ____________________________________________  FINAL CLINICAL IMPRESSION(S) / ED DIAGNOSES  Final diagnoses:  Elevated troponin  Sepsis, due to unspecified organism (HCC)  Hypotension, unspecified hypotension type  Hyperglycemia  Pleural effusion    Clinical Course      NEW MEDICATIONS STARTED DURING THIS VISIT:  New Prescriptions   No medications on file      Eula Listen, MD 01/20/2016 1530

## 2016-01-21 NOTE — ED Notes (Signed)
EMS pt from home , near syncope and weakness, Breast CA stage IV, bandages present over right chest wall , reported open wounds to region of mastectomy, mastectomy x1 year, pt hypotensive [id number] systolic, pt DNR , " reported by daughter to EMS that the pt was due for a blood transfusion today, EMS 18 gauge to left hand , CBG 256.

## 2016-01-21 NOTE — ED Triage Notes (Signed)
Patient c/o weakness today.  Patient has stage IV breast cancer and has been receiving blood transfusions.  Most recent labs from 01/18/16.

## 2016-01-26 LAB — CULTURE, BLOOD (ROUTINE X 2)
Culture: NO GROWTH
Culture: NO GROWTH

## 2016-01-27 NOTE — Progress Notes (Signed)
Pt without respirations or heart rate 

## 2016-01-27 NOTE — Discharge Summary (Signed)
Date of death- February 13, 2016 Cause of death- Sepsis secondary to cellulitis of fungating lesions from breast cancer  Brief hospital stay   Pt was brought septic from home. She had multiple failed trials of chemotherapy for her breast cancer, so family agreed on comfort care on admission. She was monitored on floor with comfort- and she died next morning.  For further details please check H& P done by me on admission.

## 2016-01-27 NOTE — Progress Notes (Signed)
Postmortem care completed on deceased patient.

## 2016-01-27 DEATH — deceased

## 2016-02-21 ENCOUNTER — Ambulatory Visit: Payer: PPO

## 2016-03-24 ENCOUNTER — Ambulatory Visit: Payer: PPO

## 2016-04-12 ENCOUNTER — Other Ambulatory Visit: Payer: Self-pay | Admitting: Nurse Practitioner

## 2016-05-30 ENCOUNTER — Ambulatory Visit: Payer: PPO | Admitting: Internal Medicine

## 2016-12-25 IMAGING — CR DG HIP (WITH OR WITHOUT PELVIS) 2-3V*R*
3 series · 3 of 3 positions shown · non-contrast
Comparison: PET-CT 04/26/2015.  Bone scan 10/11/2014.

CLINICAL DATA: Fall.

EXAM:
DG HIP (WITH OR WITHOUT PELVIS) 2-3V LEFT; DG HIP (WITH OR WITHOUT
PELVIS) 2-3V RIGHT

[hip ap]
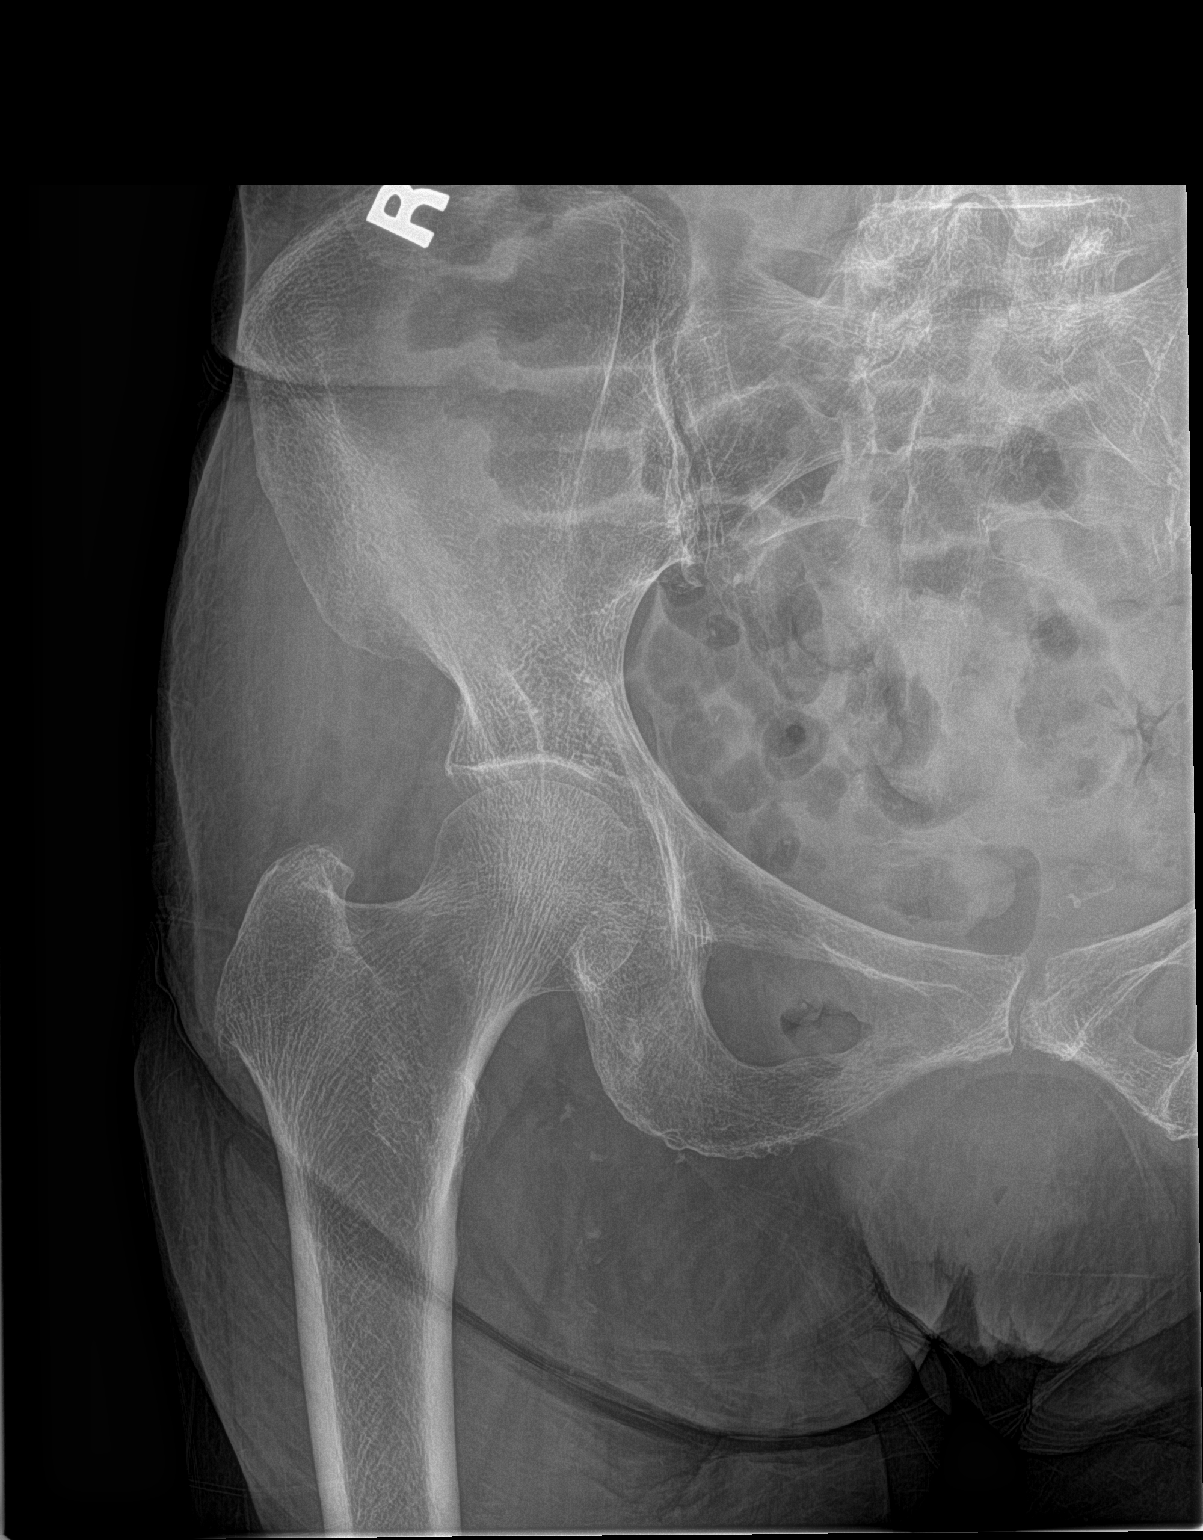

[pelvis ap]
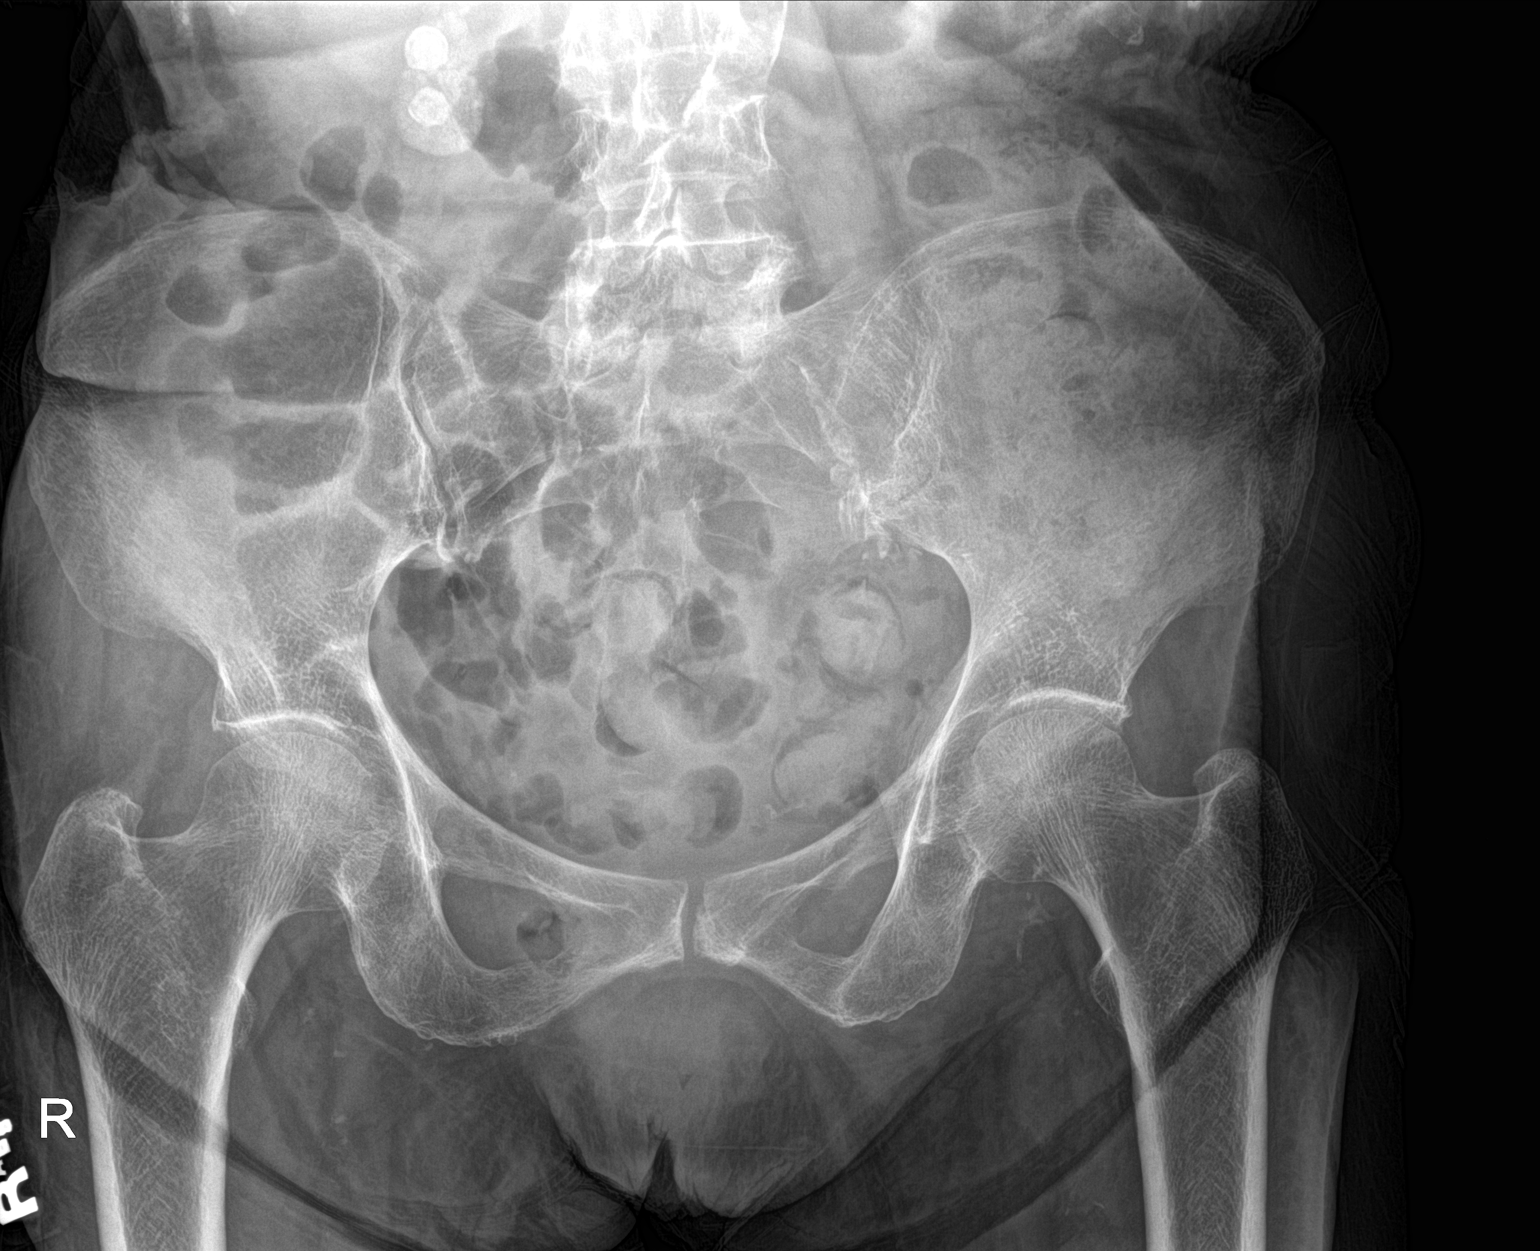

[hip lat]
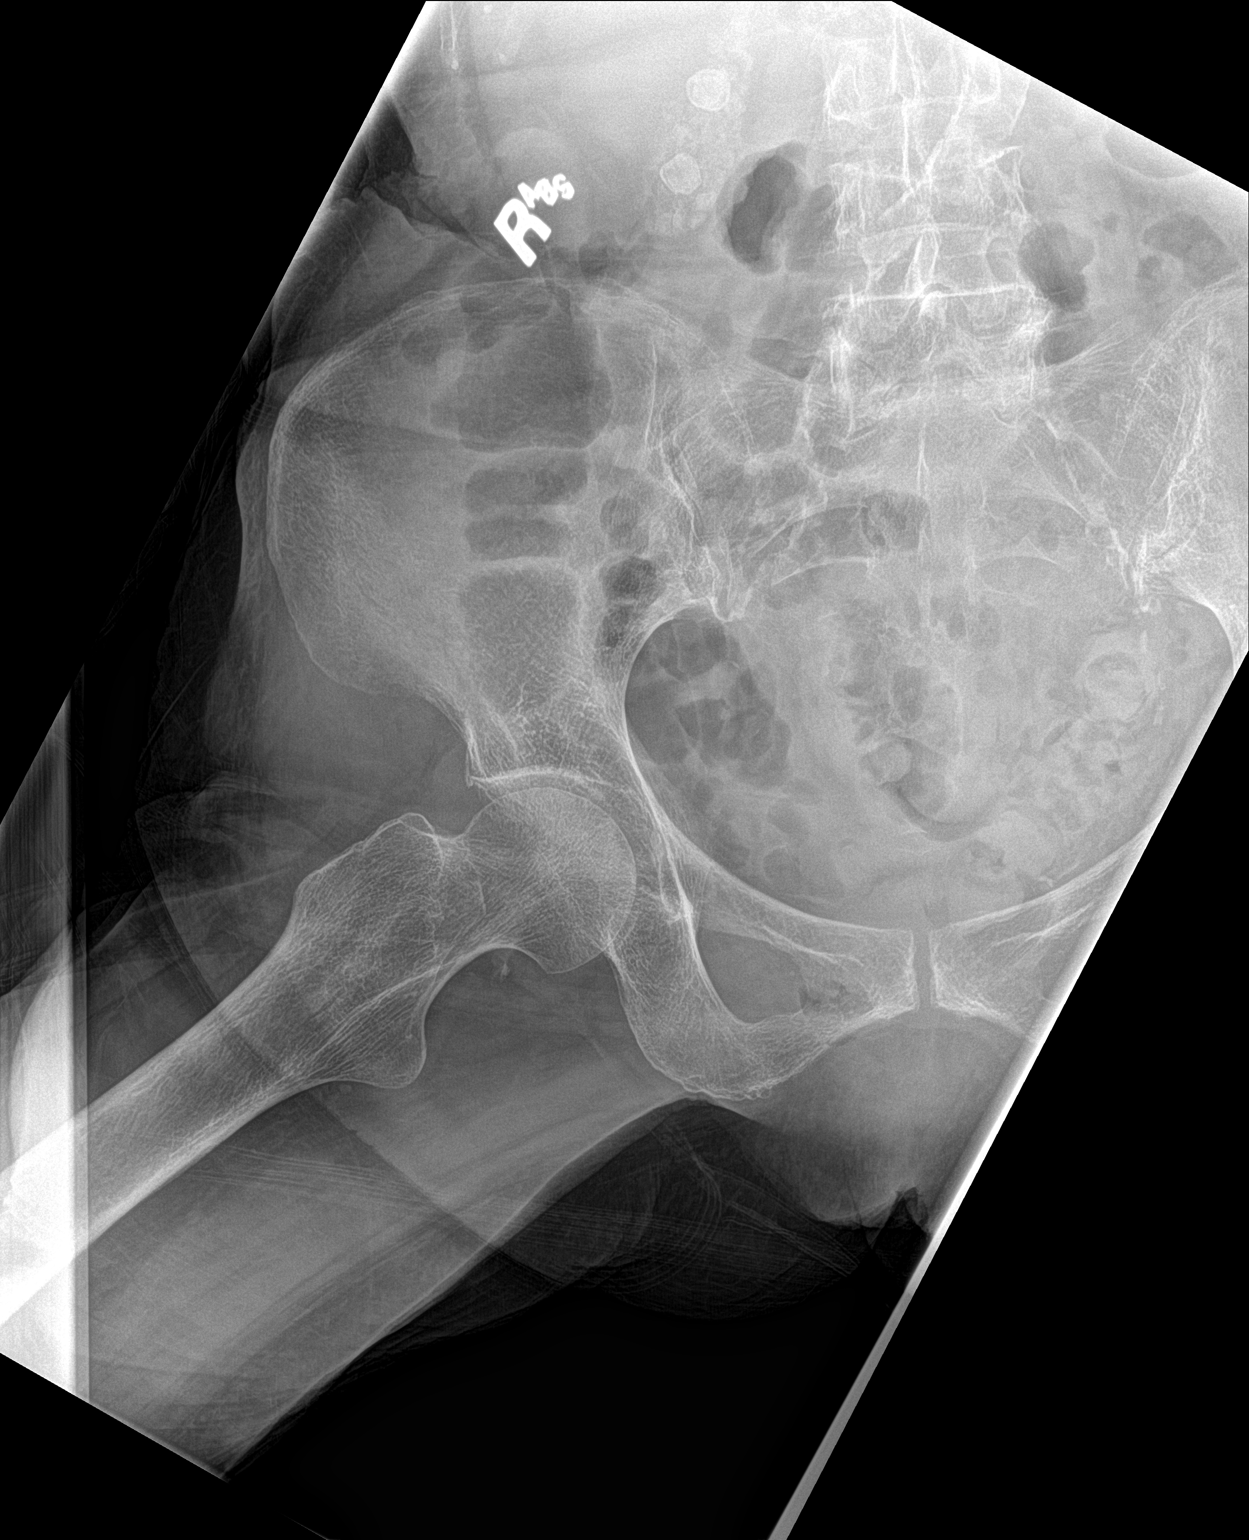

[3 of 3 positions shown; findings below may reference images not displayed]

FINDINGS: L4 compression fracture is noted, age undetermined. Diffuse
osteopenia. No evidence of hip fracture. Calcifications in the right
mid abdomen. Patient has known gallstones. Aortoiliac
atherosclerotic calcification.
IMPRESSION: 1. L4 compression fracture, age undetermined. No evidence of hip
fracture.

2.  Gallstones.

3.  Aortoiliac atherosclerotic vascular disease.

## 2016-12-25 IMAGING — CR DG HIP (WITH OR WITHOUT PELVIS) 2-3V*L*
3 series · 3 of 3 positions shown · non-contrast
Comparison: PET-CT 04/26/2015.  Bone scan 10/11/2014.

CLINICAL DATA: Fall.

EXAM:
DG HIP (WITH OR WITHOUT PELVIS) 2-3V LEFT; DG HIP (WITH OR WITHOUT
PELVIS) 2-3V RIGHT

[pelvis ap]
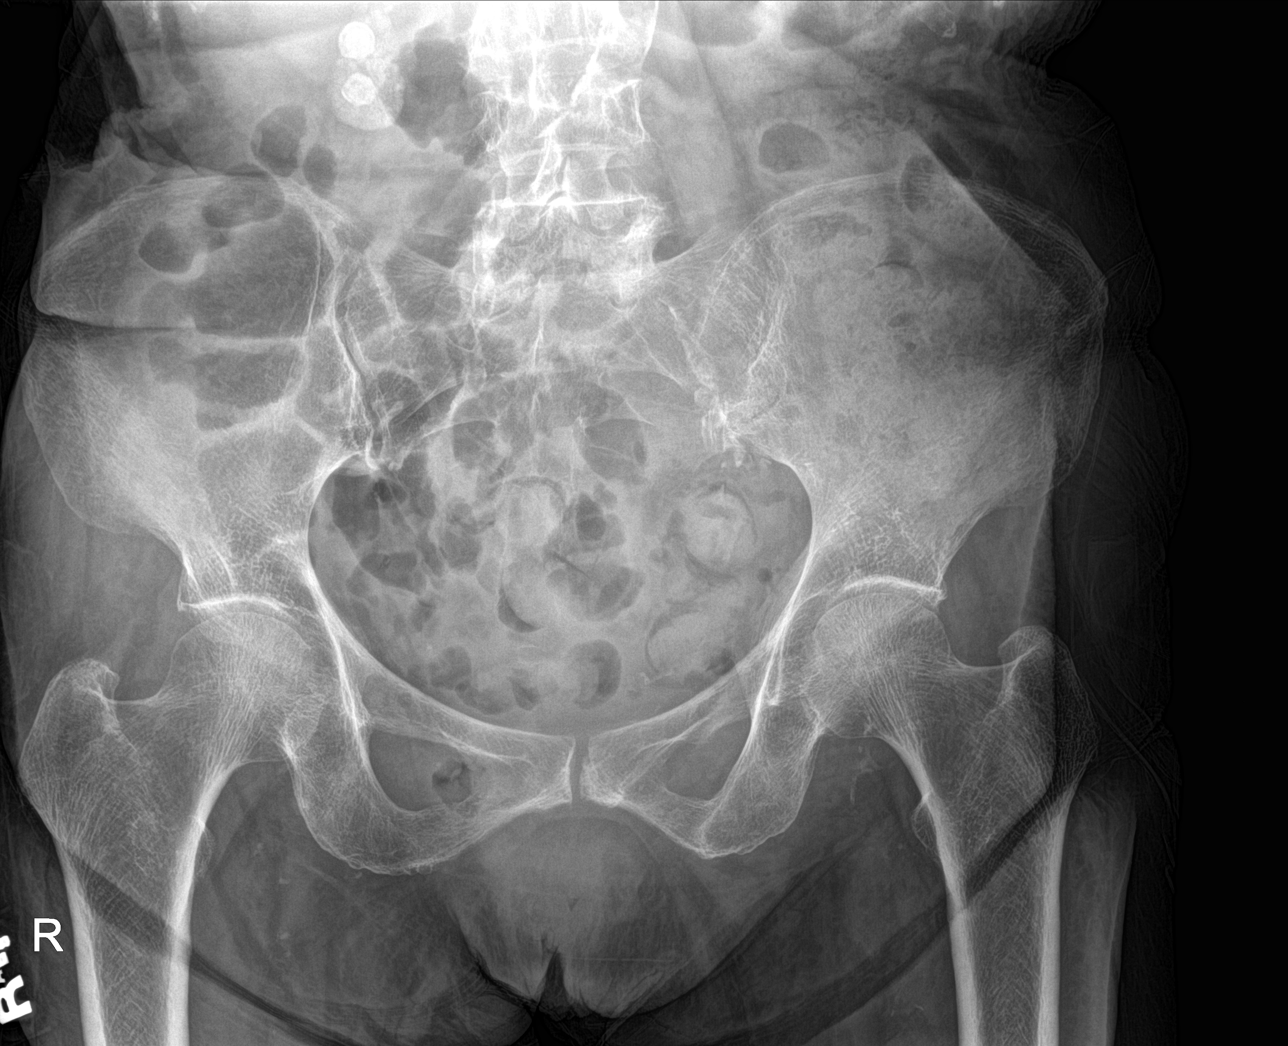

[hip ap]
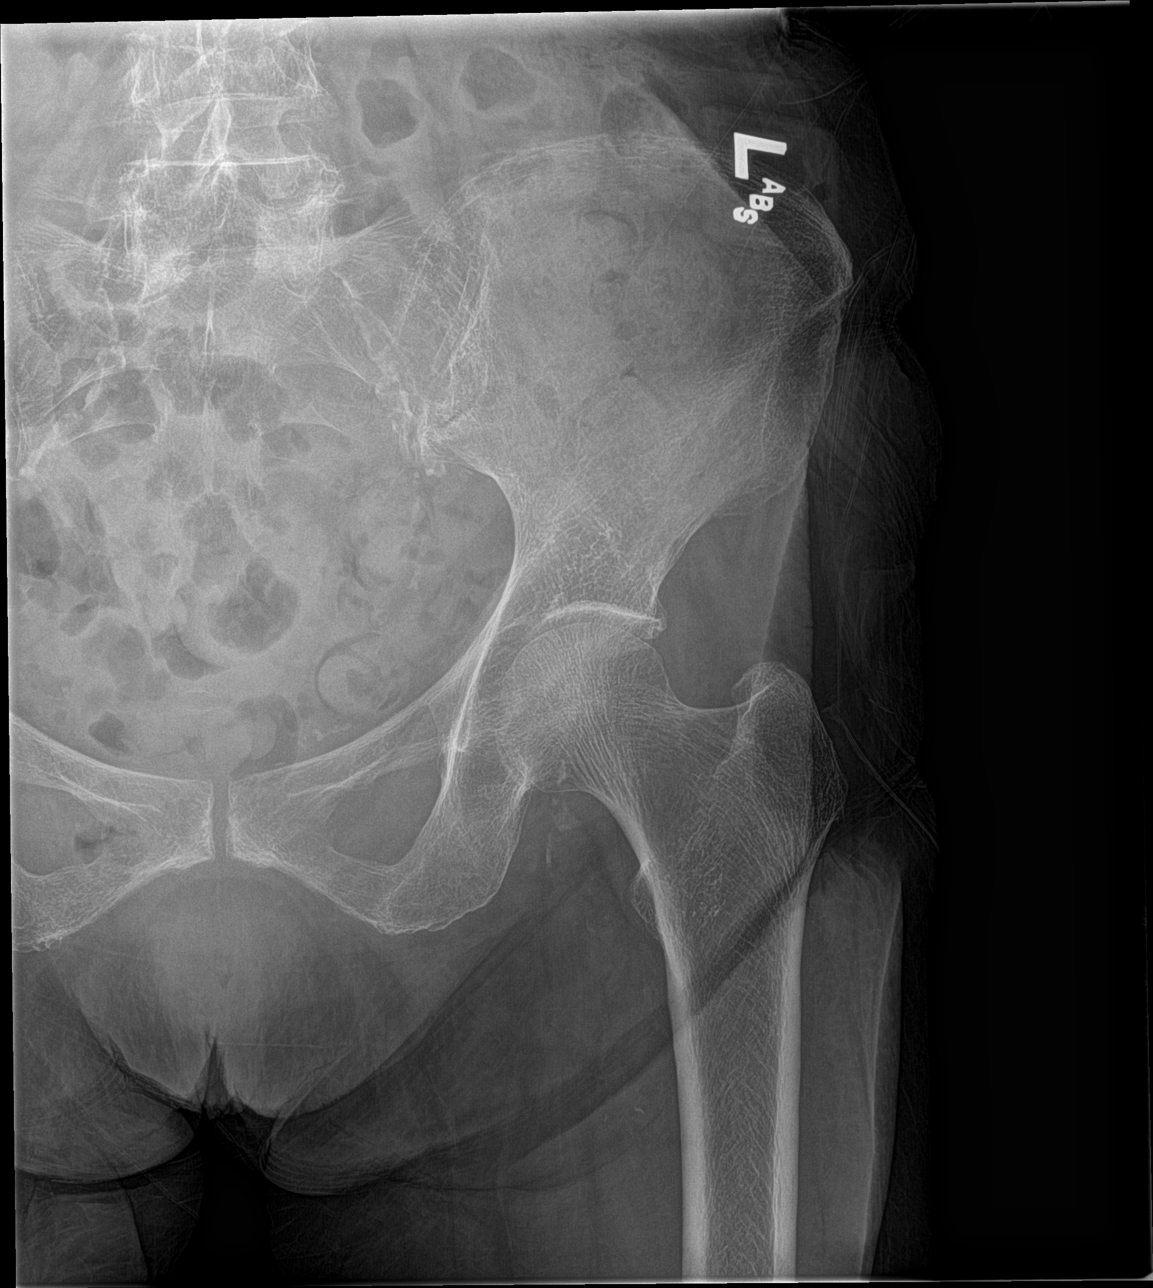

[hip lat]
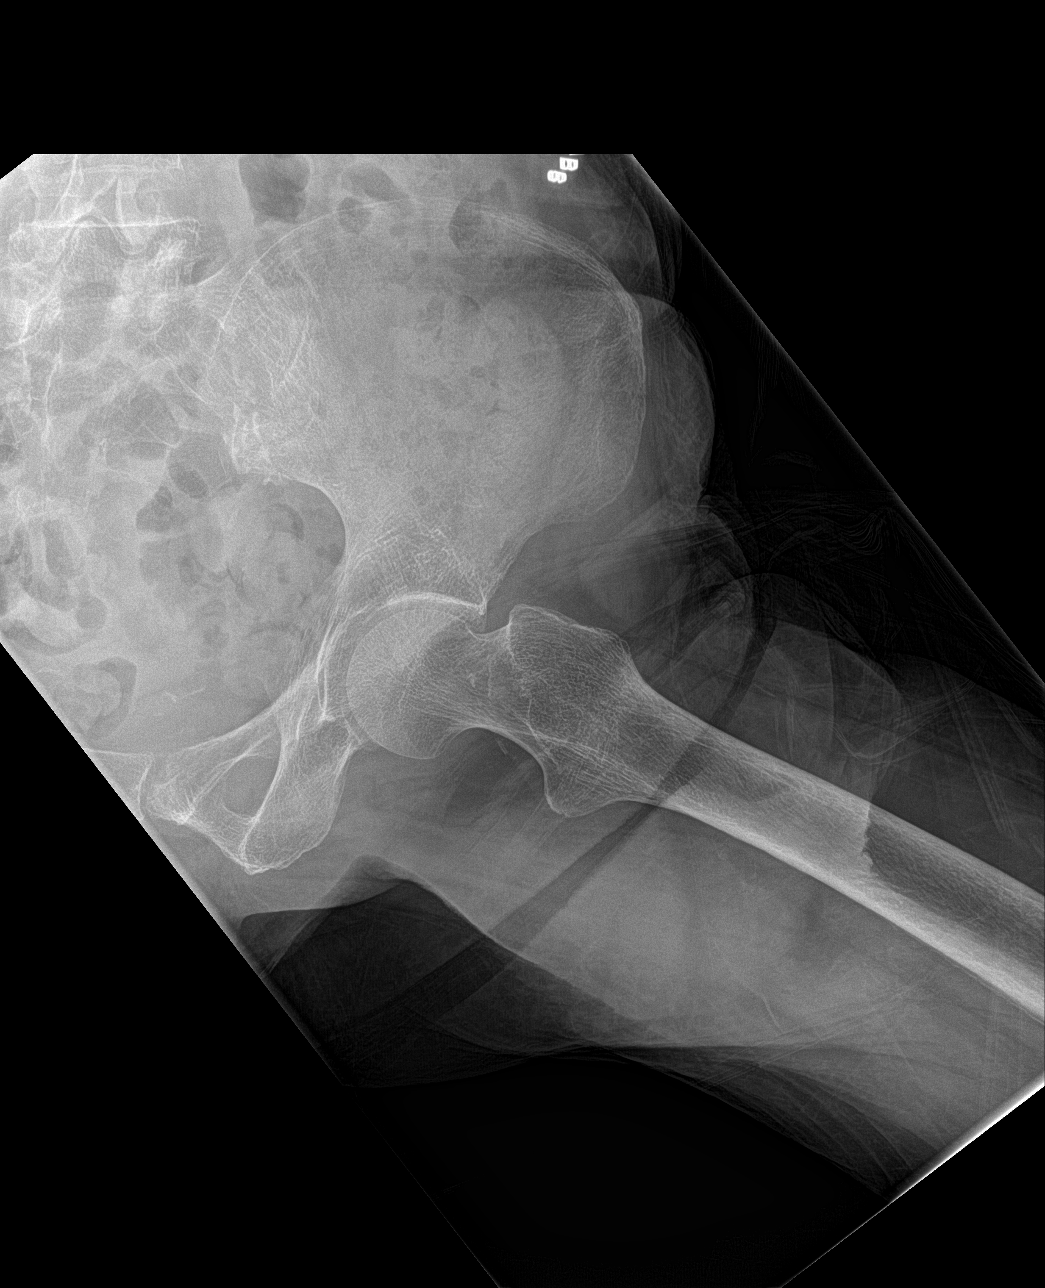

[3 of 3 positions shown; findings below may reference images not displayed]

FINDINGS: L4 compression fracture is noted, age undetermined. Diffuse
osteopenia. No evidence of hip fracture. Calcifications in the right
mid abdomen. Patient has known gallstones. Aortoiliac
atherosclerotic calcification.
IMPRESSION: 1. L4 compression fracture, age undetermined. No evidence of hip
fracture.

2.  Gallstones.

3.  Aortoiliac atherosclerotic vascular disease.

## 2017-01-21 NOTE — Telephone Encounter (Signed)
Error
